# Patient Record
Sex: Male | Born: 1960 | Race: Black or African American | Hispanic: No | Marital: Married | Smoking: Current every day smoker
Health system: Southern US, Community
[De-identification: ages and names within clinical notes are randomized; demographics above are authoritative.]

## PROBLEM LIST (undated history)

## (undated) DIAGNOSIS — B1920 Unspecified viral hepatitis C without hepatic coma: Secondary | ICD-10-CM

## (undated) HISTORY — PX: PELVIC FRACTURE SURGERY: SHX119

## (undated) HISTORY — DX: Unspecified viral hepatitis C without hepatic coma: B19.20

## (undated) HISTORY — PX: NO PAST SURGERIES: SHX2092

---

## 2005-12-23 ENCOUNTER — Emergency Department (HOSPITAL_COMMUNITY): Admission: EM | Admit: 2005-12-23 | Discharge: 2005-12-24 | Payer: Self-pay | Admitting: Emergency Medicine
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
## 2014-10-03 ENCOUNTER — Other Ambulatory Visit: Payer: Self-pay | Admitting: Family Medicine

## 2014-10-03 MED ORDER — HYDROCHLOROTHIAZIDE 25 MG PO TABS: 25.0000 mg | ORAL_TABLET | Freq: Every day | ORAL | Status: DC

## 2014-10-03 MED ORDER — TRIAMCINOLONE ACETONIDE 0.1 % EX CREA: 1.0000 "application " | TOPICAL_CREAM | Freq: Two times a day (BID) | CUTANEOUS | Status: DC

## 2014-10-03 MED ORDER — LISINOPRIL 40 MG PO TABS: 40.0000 mg | ORAL_TABLET | Freq: Every day | ORAL | Status: DC

## 2014-10-03 NOTE — Telephone Encounter (Signed)
Refilled Medication for 30 days no refill, informed patient to schedule appointment [facility]

## 2021-09-04 ENCOUNTER — Telehealth: Payer: Self-pay

## 2021-09-04 ENCOUNTER — Other Ambulatory Visit (HOSPITAL_COMMUNITY): Payer: Self-pay

## 2021-09-04 NOTE — Telephone Encounter (Signed)
[facility] Patient Advocate Encounter ? ?Insurance verification completed.   ? ?The patient is uninsured and will need patient assistance for medication. ? ?We can complete the application and will need to meet with the patient for signatures and income documentation. ? ?Ralph Brouwer, CPhT ?Specialty Pharmacy Patient Advocate ?[facility] ?Phone: [phone] ?Fax:  [phone]  ?

## 2021-09-05 ENCOUNTER — Ambulatory Visit (INDEPENDENT_AMBULATORY_CARE_PROVIDER_SITE_OTHER): Payer: No Typology Code available for payment source | Admitting: Infectious Diseases

## 2021-09-05 ENCOUNTER — Encounter: Payer: Self-pay | Admitting: Infectious Diseases

## 2021-09-05 ENCOUNTER — Other Ambulatory Visit (HOSPITAL_COMMUNITY): Payer: Self-pay

## 2021-09-05 ENCOUNTER — Other Ambulatory Visit: Payer: Self-pay

## 2021-09-05 VITALS — BP 151/90 | HR 77 | Temp 98.1°F | Wt 167.0 lb

## 2021-09-05 DIAGNOSIS — K7460 Unspecified cirrhosis of liver: Secondary | ICD-10-CM | POA: Diagnosis not present

## 2021-09-05 DIAGNOSIS — D696 Thrombocytopenia, unspecified: Secondary | ICD-10-CM | POA: Diagnosis not present

## 2021-09-05 DIAGNOSIS — B182 Chronic viral hepatitis C: Secondary | ICD-10-CM

## 2021-09-05 LAB — CBC
Hemoglobin: 15.3 g/dL (ref 13.2–17.1)
MCH: 35.8 pg — ABNORMAL HIGH (ref 27.0–33.0)
MCHC: 36.2 g/dL — ABNORMAL HIGH (ref 32.0–36.0)
MCV: 99.1 fL (ref 80.0–100.0)
RBC: 4.27 10*6/uL (ref 4.20–5.80)
RDW: 11.8 % (ref 11.0–15.0)

## 2021-09-05 NOTE — Progress Notes (Unsigned)
   Patient Name: Kenneth Flores  Date of Birth: [birth year]  MRN: [id number]  PCP: Norm Salt, PA  Referring Provider: Norm Salt, [place], Ph#: [phone]   There are no problems to display for this patient.   CC:  New patient - initial evaluation and management of chronic hepatitis C infection.   HPI/ROS:  Kenneth Flores is a 61 y.o. male.   He was diagnosed in the early 1990s. Thinks he had a treatment    Patient {does/does not:19866} have documented immunity to Hepatitis A. Patient {does/does not:19867} have documented immunity to Hepatitis B.    {ros - complete:22885} All other systems reviewed and are negative      No past medical history on file.  Prior to Admission medications   Medication Sig Start Date End Date Taking? Authorizing Provider  hydrochlorothiazide (HYDRODIURIL) 25 MG tablet Take 1 tablet (25 mg total) by mouth daily. Patient not taking: Reported on 09/05/2021 10/03/14   Ellyn Hack, MD  lisinopril (PRINIVIL,ZESTRIL) 40 MG tablet Take 1 tablet (40 mg total) by mouth daily. Patient not taking: Reported on 09/05/2021 10/03/14   Ellyn Hack, MD  triamcinolone cream (KENALOG) 0.1 % Apply 1 application topically 2 (two) times daily. Patient not taking: Reported on 09/05/2021 10/03/14   Ellyn Hack, MD    No Known Allergies  Social History   Tobacco Use   Smoking status: Every Day    Types: Cigars   Smokeless tobacco: Never  Substance Use Topics   Alcohol use: Not Currently   Drug use: Not Currently    Types: Cocaine, Marijuana    Comment: years ago    No family history on file. ***  Objective:   Vitals:   09/05/21 1419  BP: (!) 151/90  Pulse: 77  Temp: 98.1 F (36.7 C)   Constitutional: {EXAM; GENERAL APPEARANCE:5021} Eyes: anicteric Cardiovascular: {Mis exam cardio:32073} Respiratory: {Exam; lungs brief:12271} Gastrointestinal: {Exam; abdomen:5794} Musculoskeletal: {extremities:315109} Skin: {Skin exam gi:12013};  no porphyria cutanea tarda Lymphatic: no cervical lymphadenopathy   Laboratory: Genotype: No results found for: "HCVGENOTYPE" HCV viral load: No results found for: "HCVQUANT" No results found for: "WBC", "HGB", "HCT", "MCV", "PLT" No results found for: "CREATININE", "BUN", "NA", "K", "CL", "CO2" No results found for: "ALT", "AST", "GGT", "ALKPHOS"  No results found for: "INR", "BILITOT", "ALBUMIN"  APRI ***   FIB-4 ***   Imaging:  ***  Assessment & Plan:   Problem List Items Addressed This Visit   None   I spent 45 minutes with the patient including greater than 70% of time in face to face counsel of the patient re hepatitis c and the details described above and in coordination of their care.  Rexene Alberts, MSN, NP-C [facility] for Infectious Disease [facility]  [email or web address] Pager: [phone] Office: [phone] [facility] Main Line: [phone]

## 2021-09-05 NOTE — Patient Instructions (Addendum)
Nice to meet you today!    We need to get a little more information about your hepatitis c infection before we start your treatment. I anticipate that we can get you started in a few weeks after we submit approval to your insurance to ensure payment. We may need to place referral for an ultrasound and/or gastroenterology if your blood work indicates more damage to the liver than expected.    We will call you with your results (1 week usually is how long this takes) and then when your medication is ready. Our pharmacist will reach out to you to go over the medication, side effects and how to take it successfully.   We will see you back in 4 weeks after starting to see how things are going for you. Will repeat your blood work at that time.    ABOUT HEPATITIS C VIRUS:  Chronic Hepatitis C is the most common blood-borne infection in the [place], affecting approximately 3 million people.  It is the leading cause of cirrhosis, liver cancer, and end stage liver disease requiring transplantation when this infection goes untreated for many years  The majority of people who are infected are unaware because there are not many early symptoms that are specific to this and often go undiagnosed until a specific blood test is drawn.   The hepatitis c virus is passed primarily through direct exposure of contaminated blood or body fluids. It is most efficiently transmitted through repeated exposure to infected blood.  Risk for sexual transmission is very low but is possible if there is high frequency of unprotected sexual activity with known hepatitis c partner or multiple partners of known status.  Over time, approximately 60-70% of people can develop some degree of liver disease. Cirrhosis occurs in 10-20% of those with chronic infection. 1-5% will get liver cancer, which has a very high rate of death.   Approximately 15-25% clear the infection without medication (usually in the first 6 months of  becoming exposed to virus)  Newer medications provide over 95% cure rate when taken as prescribed    IN GENERAL ABOUT DIET  Persons living with chronic hepatitis c infection should eat a diet to maintain a healthy weight and avoid nutritional deficiencies.   Completely avoiding alcohol is the best decision for your liver health. If unable to do so please limit alcohol to as little as possible to less than 1 standard drink a day - this is very irritating to your liver.  Limit tylenol use to less than 2,000 mg daily (two extra strength tablets only twice a day)  If you have cirrhosis of the liver please take no more than 1,000 mg tylenol a day  Patients with cirrhosis should not have protein restriction; we recommend a protein intake of approximately 1.2-1.5 g/kg/day.   For patients with cirrhosis and hepatic encephalopathy, the American Association for the Study of Liver Diseases (AASLD) recommended protein intake is 1.2-1.5 g/kg/day.  If you experience ascites (fluid accumulation in the abdomen associated with severe liver damage / cirrhosis) please limit sodium intake to < 2000 mg a day    UNTIL YOU HAVE BEEN TREATED AND CURED:  Use condoms with all sexual encounters or practice abstinence to avoid sexual transmission   No sharing of razors, toothbrushes, nail clippers or anything that could potentially have blood on it.   If you cut yourself please clean and cover any wounds or open sores to others do not come into contact with your blood.  If blood spills onto item/surface please clean with 1:10 bleach solution and allow to dry, EVEN if it is dried blood.    GENERAL HELPFUL HINTS ON HCV THERAPY:  1. Stay well-hydrated.  2. Notify the ID Clinic of any changes in your other over-the-counter/herbal or prescription medications.  3. If you miss a dose of your medication, take the missed dose as soon as you remember. Return to your regular time/dose schedule the next day.    4.  Do not stop taking your medications without first talking with your healthcare provider.  5.  You will see our pharmacist-specialist within the first 2 weeks of starting your medication to monitor for any possible side effects.  6.  You will have blood work once during treatment 4 weeks after your first pill. Again soon after treatment is completed and one final lab 3 months after your last pill to ensure cure!   TIPS TO BE SUCCESSFUL WITH DAILY MEDICATION USE:  1. Set a reminder on your phone  2. Try filling out a pill box for the week - pick a day and put one pill for every day during the week so you know right away if you missed a pill.   3. Have a trusted family member ask you about your medications.   4. Emergency planning/management officer Instructions:  Take Mavyret, three tablets (at the same time) daily with food. Please take ALL THREE PILLS AT ONCE. You should take it at approximately the same time every day. Treatment will be for 8 weeks. Do not miss a dose.    Do not run out of Mavyret! If you are down to one week of medication left and have not heard about your next shipment, please let us know as soon as possible. You will be given 28 days of treatment at a time and will receive one refill.   If you need to start a new medication, prescription from your doctor or over the counter medication, you need to contact us to make sure it does not interfere with Mavyret. There are several medications that can interfere with Mavyret and can make you sick or make the medication not work.  If you need to take a medication for acid reflux, you can take omeprazole 20mg  daily.     Tylenol (acetominophen) and Advil (ibuprofen) are safe to take with Harvoni if needed for headache, fever, pain.   IF YOU ARE ON BIRTH CONTROL PILLS, YOU RECEIVE A SHOT FOR BIRTH CONTROL, OR YOU HAVE AN IUD, please notify your provider to make sure it is safe with Mavyret.   DO NOT stop Mavyret unless  instructed to by your provider. If you are hospitalized while taking this medication please bring it with you to the hospital to avoid interruption of therapy. Every pill is important!  The most common side effects associated with Mavyret include:  Fatigue Headache Nausea Diarrhea Insomnia

## 2021-09-08 DIAGNOSIS — D696 Thrombocytopenia, unspecified: Secondary | ICD-10-CM | POA: Insufficient documentation

## 2021-09-08 DIAGNOSIS — B182 Chronic viral hepatitis C: Secondary | ICD-10-CM | POA: Insufficient documentation

## 2021-09-08 LAB — HEPATITIS B SURFACE ANTIBODY,QUALITATIVE: Hep B S Ab: REACTIVE — AB

## 2021-09-08 LAB — HEPATITIS A ANTIBODY, TOTAL: Hepatitis A AB,Total: REACTIVE — AB

## 2021-09-08 NOTE — Assessment & Plan Note (Signed)
New Patient with Chronic Hepatitis C first diagnosed with blood test in the 1990s. Genotype unknown with inconclusive fibrosis risk based on labs provided. Will check fibrotest and ultrasound for baseline. PT/INR today as well as CBC. He may have been treated with interferon injections based on his description but that will not preclude him from any of the available DAA's now and should be simple to cure.  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 Counseled to continue to abstain/limit alcohol use to avoid accelerated fibrosis/cirrhosis risk.   He has had HIV testing at outside clinic recently and does not need to repeat this today based on our discussion.   I discussed with the patient the lab findings that confirm chronic hepatitis C as well as the natural history and progression of disease including about 30% of people who develop cirrhosis of the liver if left untreated and once cirrhosis is established there is a 2-7% risk per year of liver cancer and liver failure.  I discussed the importance of treatment and benefits in reducing the risk, even if significant liver fibrosis exists. I also discussed risk for re-infection following treatment should he not continue to modify risk factors.    Patient counseled extensively on limiting acetaminophen to no more than 2 grams daily, avoidance of alcohol.  Transmission discussed with patient including sexual transmission, sharing razors and toothbrush.   Will need referral to gastroenterology if concern for cirrhosis  Will prescribe appropriate medication based on genotype and coverage   Hepatitis A and B titers to be drawn today with appropriate vaccinations as needed   Pneumovax vaccine at upcoming visit if not previously given  Further work up to include liver staging through non-invasive serum analysis with APRI and FIB4 scores and Liver Fibrosis panel; U/S to follow if discordant or concerning  results.  Will call Garett Tetzloff back once all results are in and counsel on medication over the phone. He will return 4 weeks after starting to meet with pharmacy team and check RNA at that time.

## 2021-09-08 NOTE — Assessment & Plan Note (Signed)
Documented history of this in provided medical records but no labs to verify - check CBC today.

## 2021-09-10 ENCOUNTER — Ambulatory Visit (HOSPITAL_COMMUNITY)
Admission: RE | Admit: 2021-09-10 | Discharge: 2021-09-10 | Disposition: A | Discharge status: Home / Self Care | Payer: No Typology Code available for payment source | Source: Ambulatory Visit | Attending: Infectious Diseases | Admitting: Infectious Diseases

## 2021-09-10 DIAGNOSIS — B182 Chronic viral hepatitis C: Secondary | ICD-10-CM | POA: Insufficient documentation

## 2021-09-12 LAB — LIVER FIBROSIS, FIBROTEST-ACTITEST
ALT: 89 U/L — ABNORMAL HIGH (ref 9–46)
Bilirubin: 1.1 mg/dL (ref 0.2–1.2)
Haptoglobin: 64 mg/dL (ref 43–212)
Necroinflammat ACT Score: 0.74
Reference ID: 4422423

## 2021-09-12 LAB — CBC
HCT: 42.3 % (ref 38.5–50.0)
MPV: 12.2 fL (ref 7.5–12.5)
Platelets: 91 10*3/uL — ABNORMAL LOW (ref 140–400)
WBC: 5.2 10*3/uL (ref 3.8–10.8)

## 2021-09-12 LAB — PROTIME-INR
INR: 1
Prothrombin Time: 10.8 s (ref 9.0–11.5)

## 2021-09-12 LAB — HEPATITIS C GENOTYPE

## 2021-09-15 ENCOUNTER — Other Ambulatory Visit (HOSPITAL_COMMUNITY): Payer: Self-pay

## 2021-09-17 ENCOUNTER — Telehealth: Payer: Self-pay

## 2021-09-17 NOTE — Telephone Encounter (Signed)
[facility] Patient Advocate Encounter   Received notification from [facility] that prior authorization for MAVYRET is required.   PA submitted on 09/17/21 Key [id number] Status is pending    [facility] Clinic will continue to follow.   Clearance Coots, CPhT Specialty Pharmacy Patient [facility] Phone: [phone] Fax:  [phone]

## 2021-09-29 ENCOUNTER — Telehealth: Payer: Self-pay

## 2021-09-29 NOTE — Telephone Encounter (Signed)
Patient called with question regarding recent referrals from [facility]. Provided information on gastroenterology referral, including office and phone number [facility], [phone]). All questions answered. Patient stated he would call the [place] office to schedule.  Wyvonne Lenz, RN

## 2021-10-02 ENCOUNTER — Ambulatory Visit (INDEPENDENT_AMBULATORY_CARE_PROVIDER_SITE_OTHER): Payer: No Typology Code available for payment source | Admitting: Infectious Diseases

## 2021-10-02 ENCOUNTER — Encounter: Payer: Self-pay | Admitting: Infectious Diseases

## 2021-10-02 ENCOUNTER — Other Ambulatory Visit: Payer: Self-pay

## 2021-10-02 VITALS — BP 135/82 | HR 65 | Temp 97.9°F | Wt 164.0 lb

## 2021-10-02 DIAGNOSIS — B182 Chronic viral hepatitis C: Secondary | ICD-10-CM

## 2021-10-02 NOTE — Progress Notes (Signed)
Still has not had any update from insurance regarding PA for mavyret   No charge today and copay was refunded.

## 2021-10-10 ENCOUNTER — Telehealth: Payer: Self-pay

## 2021-10-10 NOTE — Telephone Encounter (Signed)
[facility] Patient Advocate Encounter  Completed and sent [employer] application for Mavyret for this patient who is uninsured.    Patient is approved 10/08/21 through 04/10/22.  Medication will be delivered to the clinic on or after 10/15/21.  Patient insurance denied Mavyret & Epclusa.   Clearance Coots, CPhT Specialty Pharmacy Patient [facility] for Infectious Disease Phone: [phone] Fax:  [phone]

## 2021-10-16 ENCOUNTER — Telehealth: Payer: Self-pay | Admitting: Pharmacist

## 2021-10-16 ENCOUNTER — Telehealth: Payer: Self-pay

## 2021-10-16 NOTE — Telephone Encounter (Signed)
Patient is approved to receive Mavyret x 8 weeks for chronic Hepatitis C infection. Counseled patient to take all three tablets of Mavyret daily with food.  Counseled patient the need to take all three tablets together and to not separate them out during the day. Encouraged patient not to miss any doses and explained how their chance of cure could go down with each dose missed. Counseled patient on what to do if dose is missed - if it is closer to the missed dose take immediately; if closer to next dose then skip dose and take the next dose at the usual time. Counseled patient on common side effects such as headache, fatigue, and nausea and that these normally decrease with time. I reviewed patient medications and found no drug interactions. Discussed with patient that there are several drug interactions with Mavyret and instructed patient to call the clinic if he wishes to start a new medication during course of therapy. Also advised patient to call if he experiences any side effects. Patient will follow-up with me in the pharmacy clinic on 8/25.  Margarite Gouge, PharmD, CPP, BCIDP Clinical Pharmacist Practitioner Infectious Diseases Clinical Pharmacist [facility] for Infect[facility] Disease

## 2021-10-16 NOTE — Telephone Encounter (Signed)
[facility] Patient Advocate Encounter  Patient's medications have been couriered to [facility] from [facility] and will be picked up 10/16/21.  1st box of Mavyret  Clearance Coots , CPhT Specialty Pharmacy Patient [facility] Phone: [phone] Fax:  [phone]

## 2021-10-16 NOTE — Telephone Encounter (Signed)
error 

## 2021-11-13 ENCOUNTER — Other Ambulatory Visit: Payer: Self-pay

## 2021-11-13 ENCOUNTER — Ambulatory Visit (INDEPENDENT_AMBULATORY_CARE_PROVIDER_SITE_OTHER): Payer: No Typology Code available for payment source | Admitting: Pharmacist

## 2021-11-13 DIAGNOSIS — B182 Chronic viral hepatitis C: Secondary | ICD-10-CM

## 2021-11-13 MED ORDER — MAVYRET 100-40 MG PO TABS: 3.0000 | ORAL_TABLET | Freq: Every day | ORAL | 1 refills | Status: DC

## 2021-11-13 NOTE — Progress Notes (Signed)
   HPI: Kenneth Flores is a 61 y.o. male who presents to the [facility] pharmacy clinic for Hepatitis C follow-up.  Medication: Mavyret x 8 weeks  Start Date: 10/17/21  Hepatitis C Genotype: 1a  Fibrosis Score: F4  Hepatitis C RNA: 1.82 million  Patient Active Problem List   Diagnosis Date Noted   Chronic hepatitis C without hepatic coma (HCC) 09/08/2021   Thrombocytopenia (HCC) 09/08/2021    Patient's Medications  New Prescriptions   GLECAPREVIR-PIBRENTASVIR (MAVYRET) 100-40 MG TABS    Take 3 tablets by mouth daily with breakfast.  Previous Medications   No medications on file  Modified Medications   No medications on file  Discontinued Medications   No medications on file    Allergies: No Known Allergies  Past Medical History: Past Medical History:  Diagnosis Date   Hepatitis C     Social History: Social History   Socioeconomic History   Marital status: Significant Other    Spouse name: Not on file   Number of children: Not on file   Years of education: Not on file   Highest education level: Not on file  Occupational History   Not on file  Tobacco Use   Smoking status: Every Day    Types: Cigars   Smokeless tobacco: Never  Substance and Sexual Activity   Alcohol use: Not Currently   Drug use: Not Currently    Types: Cocaine, Marijuana    Comment: years ago   Sexual activity: Not on file  Other Topics Concern   Not on file  Social History Narrative   Not on file   Social Determinants of Health   Financial Resource Strain: Not on file  Food Insecurity: Not on file  Transportation Needs: Not on file  Physical Activity: Not on file  Stress: Not on file  Social Connections: Not on file    Labs: Hepatitis C Lab Results  Component Value Date   HCVGENOTYPE 1a 09/05/2021   FIBROSTAGE F4 09/05/2021   Hepatitis B Lab Results  Component Value Date   HEPBSAB REACTIVE (A) 09/05/2021   Hepatitis A Lab Results  Component Value Date   HAV REACTIVE (A)  09/05/2021   HIV No results found for: "HIV" No results found for: "CREATININE" Lab Results  Component Value Date   ALT 89 (H) 09/05/2021   INR 1.0 09/05/2021    Assessment: Kenneth Flores presents to clinic today for HCV follow-up. He has been taking Mavyret every evening with dinner for 4 weeks and is tolerating the medicine well. He has not missed any doses. Will check HCV RNA today and follow-up in 4 weeks. No further concerns or issues. Will check LFTs at next visit.    Plan: Check HCV RNA Continue Mavyret x 4 weeks Follow up with me in 4 weeks   Margarite Gouge, PharmD, CPP, BCIDP Clinical Pharmacist Practitioner Infectious Diseases Clinical Pharmacist [facility] 11/13/2021, 4:15 PM

## 2021-11-14 ENCOUNTER — Ambulatory Visit: Payer: No Typology Code available for payment source | Admitting: Pharmacist

## 2021-11-15 LAB — HEPATITIS C RNA QUANTITATIVE
HCV Quantitative Log: <1.18 log IU/mL
HCV RNA, PCR, QN: <15 IU/mL

## 2021-12-16 ENCOUNTER — Ambulatory Visit (INDEPENDENT_AMBULATORY_CARE_PROVIDER_SITE_OTHER): Payer: No Typology Code available for payment source | Admitting: Pharmacist

## 2021-12-16 ENCOUNTER — Other Ambulatory Visit: Payer: Self-pay

## 2021-12-16 DIAGNOSIS — B182 Chronic viral hepatitis C: Secondary | ICD-10-CM | POA: Diagnosis not present

## 2021-12-16 NOTE — Progress Notes (Signed)
   HPI: Kenneth Flores is a 61 y.o. male who presents to the [place] clinic for Hepatitis C follow-up.  Medication: Mavyret x 8 weeks  Start Date: 10/17/21  Hepatitis C Genotype: 1a  Fibrosis Score: F4  Hepatitis C RNA: 1.82 million > undetectable (11/13/21)  Patient Active Problem List   Diagnosis Date Noted   Chronic hepatitis C without hepatic coma ([place]) 09/08/2021   Thrombocytopenia (HCC) 09/08/2021    Patient's Medications  New Prescriptions   No medications on file  Previous Medications   GLECAPREVIR-PIBRENTASVIR (MAVYRET) 100-40 MG TABS    Take 3 tablets by mouth daily with breakfast.  Modified Medications   No medications on file  Discontinued Medications   No medications on file    Allergies: No Known Allergies  Past Medical History: Past Medical History:  Diagnosis Date   Hepatitis C     Social History: Social History   Socioeconomic History   Marital status: Significant Other    Spouse name: Not on file   Number of children: Not on file   Years of education: Not on file   Highest education level: Not on file  Occupational History   Not on file  Tobacco Use   Smoking status: Every Day    Types: Cigars   Smokeless tobacco: Never  Substance and Sexual Activity   Alcohol use: Not Currently   Drug use: Not Currently    Types: Cocaine, Marijuana    Comment: years ago   Sexual activity: Not on file  Other Topics Concern   Not on file  Social History Narrative   Not on file   Social Determinants of Health   Financial Resource Strain: Not on file  Food Insecurity: Not on file  Transportation Needs: Not on file  Physical Activity: Not on file  Stress: Not on file  Social Connections: Not on file    Labs: Hepatitis C Lab Results  Component Value Date   HCVGENOTYPE 1a 09/05/2021   HCVRNAPCRQN <15 11/13/2021   FIBROSTAGE F4 09/05/2021   Hepatitis B Lab Results  Component Value Date   HEPBSAB REACTIVE (A) 09/05/2021   Hepatitis  A Lab Results  Component Value Date   HAV REACTIVE (A) 09/05/2021   HIV No results found for: "HIV" No results found for: "CREATININE" Lab Results  Component Value Date   ALT 89 (H) 09/05/2021   INR 1.0 09/05/2021    Assessment: Kenneth Flores presents to clinic today for HCV follow-up. He recently completed 8 weeks of [place] and continued tolerating the medicine well. He denies missing any doses and confirms he always took the medicine with food. Will check HIV RNA along with CMP today. He will follow up with Colletta Maryland in 3 months for cure visit.   Offered flu shot today which he deferred. States he has not received a flu shot in years and rarely gets sick.   Plan: Check HCV RNA and CMP Follow up with Colletta Maryland in 3 months   Alfonse Spruce, PharmD, CPP, [place] Clinical Pharmacist Practitioner Infectious Diseases [facility] 12/16/2021, 3:58 PM

## 2021-12-17 LAB — COMPREHENSIVE METABOLIC PANEL
ALT: 13 U/L (ref 9–46)
AST: 21 U/L (ref 10–35)
Alkaline phosphatase (APISO): 81 U/L (ref 35–144)
CO2: 26 mmol/L (ref 20–32)
Chloride: 102 mmol/L (ref 98–110)
Creat: 0.88 mg/dL (ref 0.70–1.30)
Globulin: 3.3 g/dL (calc) (ref 1.9–3.7)
Sodium: 137 mmol/L (ref 135–146)
Total Bilirubin: 0.6 mg/dL (ref 0.2–1.2)
Total Protein: 7.7 g/dL (ref 6.1–8.1)

## 2021-12-18 LAB — COMPREHENSIVE METABOLIC PANEL
AG Ratio: 1.3 (calc) (ref 1.0–2.5)
Albumin: 4.4 g/dL (ref 3.6–5.1)
BUN: 14 mg/dL (ref 7–25)
Calcium: 9.3 mg/dL (ref 8.6–10.3)
Glucose, Bld: 82 mg/dL (ref 65–99)
Potassium: 4 mmol/L (ref 3.5–5.3)

## 2021-12-18 LAB — HEPATITIS C RNA QUANTITATIVE
HCV Quantitative Log: 4.41 log IU/mL — ABNORMAL HIGH
HCV RNA, PCR, QN: 25700 IU/mL — ABNORMAL HIGH

## 2021-12-18 NOTE — Progress Notes (Signed)
Patient viremic after completing 8 weeks of [place]. Was undetectable at 8-month follow-up and states he never missed a dose. Denies taking any other medications. Colletta Maryland, let us know if you would like [place] to follow-up with the patient and/or restart treatment with Vosevi x 12 weeks. Thanks! Estill Bamberg

## 2021-12-18 NOTE — Progress Notes (Signed)
I know - truly! Will do - thanks.

## 2021-12-18 NOTE — Progress Notes (Signed)
Kenneth Flores, this patient failed Mavyret treatment. Can you look into Vosevi coverage for him? GT 1a, F4, RNA initially 1.82 million to undetectable on 11/13/21 (now up to 25.7K). Thanks!

## 2021-12-19 ENCOUNTER — Other Ambulatory Visit (HOSPITAL_COMMUNITY): Payer: Self-pay

## 2021-12-22 ENCOUNTER — Telehealth: Payer: Self-pay

## 2021-12-22 NOTE — Telephone Encounter (Signed)
[facility] Patient Advocate Encounter  Completed and sent Support Path application for Vosevi for this patient who is uninsured.    Patient assistance phone number for follow up is [phone].   This encounter will be updated until final determination.   Ileene Patrick, [place] [facility] Patient [facility] for Infectious Disease Phone: [phone] Fax:  [phone]

## 2021-12-23 ENCOUNTER — Telehealth: Payer: Self-pay

## 2021-12-23 ENCOUNTER — Other Ambulatory Visit: Payer: Self-pay | Admitting: Pharmacist

## 2021-12-23 DIAGNOSIS — B182 Chronic viral hepatitis C: Secondary | ICD-10-CM

## 2021-12-23 MED ORDER — VOSEVI 400-100-100 MG PO TABS: 1.0000 | ORAL_TABLET | Freq: Every day | ORAL | 2 refills | Status: DC

## 2021-12-23 NOTE — Telephone Encounter (Signed)
[facility] Patient Advocate Encounter  Completed and sent Support Path application for Vosevi for this patient who is uninsured.    Patient is approved 12/23/21 through 03/25/2022.  I faxed harcopy prescription to [phone]   Medication will be delivered to the office.   Ileene Patrick, [place] [facility] Patient [facility] for Infectious Disease Phone: [phone] Fax:  [phone]

## 2021-12-25 ENCOUNTER — Telehealth: Payer: Self-pay

## 2021-12-25 NOTE — Telephone Encounter (Addendum)
Patient is approved to receive Vosevi x 12 weeks for chronic Hepatitis C infection. Counseled patient to take Vosevi daily WITH food. Encouraged patient not to miss any doses and explained how their chance of cure could go down with each dose missed. Counseled patient on what to do if dose is missed - if it is closer to the missed dose take immediately; if closer to next dose skip dose and take the next dose at the usual time. Counseled patient on common side effects such as headache, fatigue, and nausea and that these normally decrease with time. I reviewed patient medications and found no drug interactions. Discussed with patient that there are several drug interactions. Instructed patient to call clinic if he wishes to start a new medication during course of therapy. Also advised patient to call if he experiences any side effects. Patient will follow-up with Cassie in the pharmacy clinic on 01/21/2022.   Eliseo Gum, PharmD PGY1 Pharmacy Resident   12/25/2021  4:03 PM

## 2021-12-25 NOTE — Telephone Encounter (Signed)
[facility] Patient Advocate Encounter  Patient's medications have been couriered to [facility] from [employer] and will be picked up 12/25/21.  [place] , [place] Patient [facility] for Infectious Disease Phone: [phone] Fax:  [phone]

## 2022-01-14 ENCOUNTER — Telehealth: Payer: Self-pay

## 2022-01-14 NOTE — Telephone Encounter (Signed)
[facility] Patient Advocate Encounter  Patient's medications [place] have been couriered to [facility] from [place]: [phone], and will be administered on  01/21/2022.

## 2022-01-21 ENCOUNTER — Other Ambulatory Visit: Payer: Self-pay

## 2022-01-21 ENCOUNTER — Ambulatory Visit (INDEPENDENT_AMBULATORY_CARE_PROVIDER_SITE_OTHER): Payer: No Typology Code available for payment source | Admitting: Pharmacist

## 2022-01-21 DIAGNOSIS — B182 Chronic viral hepatitis C: Secondary | ICD-10-CM

## 2022-01-21 DIAGNOSIS — Z23 Encounter for immunization: Secondary | ICD-10-CM

## 2022-01-21 NOTE — Progress Notes (Unsigned)
01/21/2022  HPI: Kenneth Flores is a 61 y.o. male who presents to the [place] clinic for Hepatitis C follow-up.  Medication: Vosevi x 12 weeks   Start Date: 12/25/21   Hepatitis C Genotype: 1a  Fibrosis Score: F4   Hepatitis C RNA: 1.82 million>>undetectable (11/13/21)>>25,700 (12/18/21)  Patient Active Problem List   Diagnosis Date Noted   Chronic hepatitis C without hepatic coma ([place]) 09/08/2021   Thrombocytopenia ([place]) 09/08/2021    Patient's Medications  New Prescriptions   No medications on file  Previous Medications   SOFOSBUV-VELPATASV-VOXILAPREV (VOSEVI) 400-100-100 MG TABS    Take 1 tablet by mouth daily at 6 (six) AM.  Modified Medications   No medications on file  Discontinued Medications   No medications on file    Allergies: No Known Allergies  Past Medical History: Past Medical History:  Diagnosis Date   Hepatitis C     Social History: Social History   Socioeconomic History   Marital status: Significant Other    Spouse name: Not on file   Number of children: Not on file   Years of education: Not on file   Highest education level: Not on file  Occupational History   Not on file  Tobacco Use   Smoking status: Every Day    Types: Cigars   Smokeless tobacco: Never  Substance and Sexual Activity   Alcohol use: Not Currently   Drug use: Not Currently    Types: Cocaine, Marijuana    Comment: years ago   Sexual activity: Not on file  Other Topics Concern   Not on file  Social History Narrative   Not on file   Social Determinants of Health   Financial Resource Strain: Not on file  Food Insecurity: Not on file  Transportation Needs: Not on file  Physical Activity: Not on file  Stress: Not on file  Social Connections: Not on file    Labs: Hepatitis C Lab Results  Component Value Date   HCVGENOTYPE 1a 09/05/2021   HCVRNAPCRQN 25,700 (H) 12/16/2021   HCVRNAPCRQN <15 11/13/2021   FIBROSTAGE F4 09/05/2021   Hepatitis B Lab Results   Component Value Date   HEPBSAB REACTIVE (A) 09/05/2021   Hepatitis A Lab Results  Component Value Date   HAV REACTIVE (A) 09/05/2021   HIV No results found for: "HIV" Lab Results  Component Value Date   CREATININE 0.88 12/16/2021   Lab Results  Component Value Date   AST 21 12/16/2021   ALT 13 12/16/2021   ALT 89 (H) 09/05/2021   INR 1.0 09/05/2021    Assessment: Patient reports to clinic today for follow-up on hepatitis C treatment with Vosevi. Patient recently completed 8 weeks of treatment with Mavyret and was initially undetectable at 42-month follow-up, however at EOT his viral load was again elevated at 25,700. Patient reported being adherent with his medication and was taking the medication appropriately, thus this was deemed a treatment failure and patient was started on Vosevi.   Patient reports he had some diarrhea the first 2 weeks he was taking the medication, but no issues since. Patient was counseled that this is a typical "start-up" symptom associated with the medication and to contact the [facility] clinc if these symptoms recur.   After discussion of the risks and benefits, patient has agreed to proceed with influenza vaccine.    Plan: Vosevi refill provided today in clinic  F/U HCV RNA  Influenza vaccine administered IM x1 in clinic today  Next appointment scheduled  for 02/17/22 with me   Adria Dill, PharmD PGY-2 Infectious Diseases Resident  01/21/2022 7:27 AM

## 2022-01-22 ENCOUNTER — Ambulatory Visit: Payer: No Typology Code available for payment source | Admitting: Pharmacist

## 2022-01-23 LAB — HEPATITIS C RNA QUANTITATIVE
HCV Quantitative Log: <1.18 log IU/mL
HCV RNA, PCR, QN: <15 IU/mL

## 2022-02-05 ENCOUNTER — Telehealth: Payer: Self-pay

## 2022-02-05 NOTE — Telephone Encounter (Signed)
[facility] Patient Advocate Encounter  Patient's medications have been couriered to [facility] from [facility] and will be picked up 02/17/22.  3rd Vosevi bottle  Clearance Coots , CPhT Specialty Pharmacy Patient [facility] for Infectious Disease Phone: [phone] Fax:  [phone]

## 2022-02-16 NOTE — Progress Notes (Signed)
02/16/2022  HPI: Kenneth Flores is a 60 y.o. male who presents to the [facility] pharmacy clinic for Hepatitis C follow-up.  Medication: Vosevi x 12 weeks  Start Date: 12/25/2021  Hepatitis C Genotype: 1a  Fibrosis Score: F4  Hepatitis C RNA: 1.82 million --> undetectable (11/13/2021) --> 25,700 (12/18/2021) --> undetectable (01/21/2022)  Patient Active Problem List   Diagnosis Date Noted   Chronic hepatitis C without hepatic coma (HCC) 09/08/2021   Thrombocytopenia (HCC) 09/08/2021    Patient's Medications  New Prescriptions   No medications on file  Previous Medications   SOFOSBUV-VELPATASV-VOXILAPREV (VOSEVI) 400-100-100 MG TABS    Take 1 tablet by mouth daily at 6 (six) AM.  Modified Medications   No medications on file  Discontinued Medications   No medications on file    Allergies: No Known Allergies  Past Medical History: Past Medical History:  Diagnosis Date   Hepatitis C     Social History: Social History   Socioeconomic History   Marital status: Significant Other    Spouse name: Not on file   Number of children: Not on file   Years of education: Not on file   Highest education level: Not on file  Occupational History   Not on file  Tobacco Use   Smoking status: Every Day    Types: Cigars   Smokeless tobacco: Never  Substance and Sexual Activity   Alcohol use: Not Currently   Drug use: Not Currently    Types: Cocaine, Marijuana    Comment: years ago   Sexual activity: Not on file  Other Topics Concern   Not on file  Social History Narrative   Not on file   Social Determinants of Health   Financial Resource Strain: Not on file  Food Insecurity: Not on file  Transportation Needs: Not on file  Physical Activity: Not on file  Stress: Not on file  Social Connections: Not on file    Labs: Hepatitis C Lab Results  Component Value Date   HCVGENOTYPE 1a 09/05/2021   HCVRNAPCRQN <15 01/21/2022   HCVRNAPCRQN 25,700 (H) 12/16/2021   HCVRNAPCRQN  <15 11/13/2021   FIBROSTAGE F4 09/05/2021   Hepatitis B Lab Results  Component Value Date   HEPBSAB REACTIVE (A) 09/05/2021   Hepatitis A Lab Results  Component Value Date   HAV REACTIVE (A) 09/05/2021   HIV No results found for: "HIV" Lab Results  Component Value Date   CREATININE 0.88 12/16/2021   Lab Results  Component Value Date   AST 21 12/16/2021   ALT 13 12/16/2021   ALT 89 (H) 09/05/2021   INR 1.0 09/05/2021    Assessment: Kenneth Flores is presents today for a Hep C follow-up visit. He has been on Vosevi and reports no diarrhea since last visit and hasn't missed any doses. His HCV RNA was undetectable at last visit and we will check HCV RNA today for continued treatment assessment.    He is eligible for the COVID, Tdap, and Prevnar20 vaccines. After discussing the risks and benefits, he accepted the COVID and Prevnar20 vaccines today. Will defer the Tdap since he doesn't want too many vaccines at once but he is open to getting it next visit. Administered the COVID and Prevnar20 vaccines in the left deltoid.  Plan: - Administer the COVID and Prevnar20 vaccines today - Collect HCV RNA today - Follow up on 03/30/2022 with Judeth Cornfield for EOT visit - Offer Tdap vaccine at next visit  Jacolyn Reedy, Student Pharm-D [facility]  for Infectious Disease

## 2022-02-17 ENCOUNTER — Other Ambulatory Visit: Payer: Self-pay

## 2022-02-17 ENCOUNTER — Encounter: Payer: Self-pay | Admitting: Pharmacist

## 2022-02-17 ENCOUNTER — Ambulatory Visit (INDEPENDENT_AMBULATORY_CARE_PROVIDER_SITE_OTHER): Payer: No Typology Code available for payment source

## 2022-02-17 ENCOUNTER — Ambulatory Visit (INDEPENDENT_AMBULATORY_CARE_PROVIDER_SITE_OTHER): Payer: No Typology Code available for payment source | Admitting: Pharmacist

## 2022-02-17 DIAGNOSIS — Z23 Encounter for immunization: Secondary | ICD-10-CM | POA: Diagnosis not present

## 2022-02-17 DIAGNOSIS — B182 Chronic viral hepatitis C: Secondary | ICD-10-CM

## 2022-02-19 LAB — HEPATITIS C RNA QUANTITATIVE
HCV Quantitative Log: <1.18 log IU/mL
HCV RNA, PCR, QN: <15 IU/mL

## 2022-03-12 ENCOUNTER — Ambulatory Visit: Payer: No Typology Code available for payment source | Admitting: Infectious Diseases

## 2022-03-30 ENCOUNTER — Encounter: Payer: Self-pay | Admitting: Infectious Diseases

## 2022-03-30 ENCOUNTER — Ambulatory Visit (INDEPENDENT_AMBULATORY_CARE_PROVIDER_SITE_OTHER): Payer: No Typology Code available for payment source | Admitting: Infectious Diseases

## 2022-03-30 ENCOUNTER — Other Ambulatory Visit: Payer: Self-pay

## 2022-03-30 VITALS — BP 166/94 | HR 76 | Temp 97.8°F | Ht 72.0 in | Wt 177.0 lb

## 2022-03-30 DIAGNOSIS — B182 Chronic viral hepatitis C: Secondary | ICD-10-CM | POA: Diagnosis not present

## 2022-03-30 NOTE — Assessment & Plan Note (Signed)
Kenneth Flores is here for EOT follow up after 2nd attempt to eradicate Hep C. Now nearly complete with Vosevi.  He has some coarsened liver findings on U/S and fibrotest F4 but no signs of decompensated disease. Child Pugh A on retrospective check. I don't think we missed this for him, he just failed and was the unfortunately statistic. Alcohol consumption likely did not contribute to failure either (unless is caused him to miss large spans of time with his medication, which I do not suspect at all).   He has tolerated Vosevi w/o any side effects. His viral loads as expected have been responsive to treatment. Will check HCV RNA and CMP today. RTC in 62m for SVR visit.   He will need [place] screenings going forward with PCP. Will arrange a U/S at return visit + AFP at that lab draw.   Counseled that he should eliminate / reduce ETOH for general liver health - never binge and 1 standard drink a few days a week. This will also help his blood pressure in turn.

## 2022-03-30 NOTE — Progress Notes (Signed)
Patient Name: Kenneth Flores  Date of Birth: November 13, 1960  MRN: [id number]  PCP: Norm Salt, PA  Referring Provider: Norm Salt, [place], Ph#: [phone]   Patient Active Problem List   Diagnosis Date Noted   Chronic hepatitis C without hepatic coma (HCC) 09/08/2021   Thrombocytopenia (HCC) 09/08/2021    CC: Hepatitis C follow up    HPI/ROS:  Kenneth Flores is a 62 y.o. male with chronic hepatitis C, genotype 1a, F4.  Attempted treatment with mavyret in 2023 with rebound viremia to 25,700 copies 12/18/21 at SVR unfortunately.  He is now on Vosevi for 2nd attempt at treatment since October 2023. He is on his last bottle and has several doses left. Has not missed any doses. He does wonder if drinking alcohol may have had something to do with failure the first time.    ROS: Constitutional: negative for fevers, chills, and weight loss Eyes: negative for icterus Cardiovascular: negative for chest pain, lower extremity edema Gastrointestinal: negative for nausea, vomiting, change in bowel habits, abdominal pain, and jaundice Neurological: negative for headaches and dizziness Behavioral/Psych: negative for excessive alcohol consumption and illegal drug usage All other systems reviewed and are negative        Past Medical History:  Diagnosis Date   Hepatitis C     Prior to Admission medications   Medication Sig Start Date End Date Taking? Authorizing Provider  hydrochlorothiazide (HYDRODIURIL) 25 MG tablet Take 1 tablet (25 mg total) by mouth daily. Patient not taking: Reported on 09/05/2021 10/03/14   Ellyn Hack, MD  lisinopril (PRINIVIL,ZESTRIL) 40 MG tablet Take 1 tablet (40 mg total) by mouth daily. Patient not taking: Reported on 09/05/2021 10/03/14   Ellyn Hack, MD  triamcinolone cream (KENALOG) 0.1 % Apply 1 application topically 2 (two) times daily. Patient not taking: Reported on 09/05/2021 10/03/14   Ellyn Hack, MD    No Known Allergies  Social  History   Tobacco Use   Smoking status: Every Day    Types: Cigars   Smokeless tobacco: Never  Substance Use Topics   Alcohol use: Not Currently   Drug use: Not Currently    Types: Cocaine, Marijuana    Comment: years ago    Family History  Problem Relation Age of Onset   Hepatitis C Brother      Objective:   Vitals:   03/30/22 1616  BP: (!) 166/94  Pulse: 76  Temp: 97.8 F (36.6 C)    Constitutional: in no apparent distress, alert, and oriented times 3 Eyes: anicteric Cardiovascular: Cor RRR Respiratory: clear Gastrointestinal: Bowel sounds are normal, liver is not enlarged, spleen is not enlarged Musculoskeletal: peripheral pulses normal, no pedal edema, no clubbing or cyanosis Skin: negative for - jaundice, spider hemangioma, telangiectasia, palmar erythema, ecchymosis and atrophy; no porphyria cutanea tarda Lymphatic: no cervical lymphadenopathy   Laboratory: 02/17/22 Hep C RNA < 15 copies  01/21/22 Hep C RNA < 15 copies  12/16/21 Hep C RNA 25,700 copies  11/13/2021 Hep C RNA < 15 copies    Imaging:  08/2021: Liver: No focal lesion identified. Coarsened hepatic echotexture with increased echogenicity. Portal vein is patent on color Doppler imaging with normal direction of blood flow towards the liver.   Assessment & Plan:   Problem List Items Addressed This Visit       Unprioritized   Chronic hepatitis C without hepatic coma (HCC) - Primary    Kenneth Flores is here for EOT follow up  after 2nd attempt to eradicate Hep C. Now nearly complete with Vosevi.  He has some coarsened liver findings on U/S and fibrotest F4 but no signs of decompensated disease. Child Pugh A on retrospective check. I don't think we missed this for him, he just failed and was the unfortunately statistic. Alcohol consumption likely did not contribute to failure either (unless is caused him to miss large spans of time with his medication, which I do not suspect at all).   He has tolerated  Vosevi w/o any side effects. His viral loads as expected have been responsive to treatment. Will check HCV RNA and CMP today. RTC in 39m for SVR visit.   He will need [place] screenings going forward with PCP. Will arrange a U/S at return visit + AFP at that lab draw.   Counseled that he should eliminate / reduce ETOH for general liver health - never binge and 1 standard drink a few days a week. This will also help his blood pressure in turn.       Relevant Orders   Hepatitis C RNA quantitative ([facility])   COMPLETE METABOLIC PANEL WITH GFR     Janene Madeira, MSN, NP-C [place] for Infectious Disease [email or web address] Pager: [phone] Office: [phone] [place]: [phone]

## 2022-03-30 NOTE — Patient Instructions (Signed)
See you in May! Will let you know what today's blood work shows.   Finish up your bottle of Vosevi - every pill please!

## 2022-03-31 LAB — COMPLETE METABOLIC PANEL WITH GFR
ALT: 11 U/L (ref 9–46)
AST: 17 U/L (ref 10–35)
Albumin: 4.5 g/dL (ref 3.6–5.1)
CO2: 25 mmol/L (ref 20–32)
Creat: 0.83 mg/dL (ref 0.70–1.30)
Total Protein: 8 g/dL (ref 6.1–8.1)
eGFR: 101 mL/min/{1.73_m2} (ref >=60)

## 2022-04-01 LAB — COMPLETE METABOLIC PANEL WITH GFR
AG Ratio: 1.3 (calc) (ref 1.0–2.5)
Alkaline phosphatase (APISO): 66 U/L (ref 35–144)
BUN: 13 mg/dL (ref 7–25)
Calcium: 9.6 mg/dL (ref 8.6–10.3)
Chloride: 109 mmol/L (ref 98–110)
Globulin: 3.5 g/dL (calc) (ref 1.9–3.7)
Glucose, Bld: 86 mg/dL (ref 65–99)
Potassium: 4.1 mmol/L (ref 3.5–5.3)
Sodium: 144 mmol/L (ref 135–146)
Total Bilirubin: 0.5 mg/dL (ref 0.2–1.2)

## 2022-04-01 LAB — HEPATITIS C RNA QUANTITATIVE
HCV Quantitative Log: <1.18 log IU/mL
HCV RNA, PCR, QN: <15 IU/mL

## 2022-08-18 DIAGNOSIS — S199XXA Unspecified injury of neck, initial encounter: Secondary | ICD-10-CM

## 2022-08-18 HISTORY — DX: Unspecified injury of neck, initial encounter: S19.9XXA

## 2022-08-19 ENCOUNTER — Emergency Department (HOSPITAL_COMMUNITY): Payer: PRIVATE HEALTH INSURANCE

## 2022-08-19 ENCOUNTER — Encounter (HOSPITAL_COMMUNITY): Payer: Self-pay | Admitting: Emergency Medicine

## 2022-08-19 ENCOUNTER — Other Ambulatory Visit: Payer: Self-pay

## 2022-08-19 ENCOUNTER — Inpatient Hospital Stay (HOSPITAL_COMMUNITY)
Admission: EM | Admit: 2022-08-19 | Discharge: 2022-08-27 | DRG: 029 | Disposition: A | Discharge status: Rehabilitation Facility | Payer: PRIVATE HEALTH INSURANCE | Attending: Neurosurgery | Admitting: Neurosurgery

## 2022-08-19 DIAGNOSIS — M2578 Osteophyte, vertebrae: Secondary | ICD-10-CM | POA: Diagnosis present

## 2022-08-19 DIAGNOSIS — R55 Syncope and collapse: Secondary | ICD-10-CM | POA: Diagnosis present

## 2022-08-19 DIAGNOSIS — N319 Neuromuscular dysfunction of bladder, unspecified: Secondary | ICD-10-CM | POA: Diagnosis present

## 2022-08-19 DIAGNOSIS — F17290 Nicotine dependence, other tobacco product, uncomplicated: Secondary | ICD-10-CM | POA: Diagnosis present

## 2022-08-19 DIAGNOSIS — Z8619 Personal history of other infectious and parasitic diseases: Secondary | ICD-10-CM

## 2022-08-19 DIAGNOSIS — Z1152 Encounter for screening for COVID-19: Secondary | ICD-10-CM

## 2022-08-19 DIAGNOSIS — M792 Neuralgia and neuritis, unspecified: Secondary | ICD-10-CM | POA: Diagnosis not present

## 2022-08-19 DIAGNOSIS — B1920 Unspecified viral hepatitis C without hepatic coma: Secondary | ICD-10-CM | POA: Diagnosis not present

## 2022-08-19 DIAGNOSIS — K5900 Constipation, unspecified: Secondary | ICD-10-CM | POA: Diagnosis not present

## 2022-08-19 DIAGNOSIS — F4321 Adjustment disorder with depressed mood: Secondary | ICD-10-CM | POA: Diagnosis not present

## 2022-08-19 DIAGNOSIS — E871 Hypo-osmolality and hyponatremia: Secondary | ICD-10-CM | POA: Diagnosis not present

## 2022-08-19 DIAGNOSIS — S14155S Other incomplete lesion at C5 level of cervical spinal cord, sequela: Secondary | ICD-10-CM | POA: Diagnosis not present

## 2022-08-19 DIAGNOSIS — G4700 Insomnia, unspecified: Secondary | ICD-10-CM | POA: Diagnosis not present

## 2022-08-19 DIAGNOSIS — M4712 Other spondylosis with myelopathy, cervical region: Secondary | ICD-10-CM | POA: Diagnosis present

## 2022-08-19 DIAGNOSIS — S14109A Unspecified injury at unspecified level of cervical spinal cord, initial encounter: Secondary | ICD-10-CM | POA: Diagnosis present

## 2022-08-19 DIAGNOSIS — R39198 Other difficulties with micturition: Secondary | ICD-10-CM | POA: Diagnosis present

## 2022-08-19 DIAGNOSIS — M4802 Spinal stenosis, cervical region: Secondary | ICD-10-CM | POA: Diagnosis present

## 2022-08-19 DIAGNOSIS — I10 Essential (primary) hypertension: Secondary | ICD-10-CM | POA: Diagnosis not present

## 2022-08-19 DIAGNOSIS — W19XXXA Unspecified fall, initial encounter: Secondary | ICD-10-CM | POA: Diagnosis present

## 2022-08-19 DIAGNOSIS — F17210 Nicotine dependence, cigarettes, uncomplicated: Secondary | ICD-10-CM | POA: Diagnosis not present

## 2022-08-19 DIAGNOSIS — S14155D Other incomplete lesion at C5 level of cervical spinal cord, subsequent encounter: Secondary | ICD-10-CM | POA: Diagnosis not present

## 2022-08-19 DIAGNOSIS — M47812 Spondylosis without myelopathy or radiculopathy, cervical region: Secondary | ICD-10-CM | POA: Diagnosis not present

## 2022-08-19 DIAGNOSIS — S14154A Other incomplete lesion at C4 level of cervical spinal cord, initial encounter: Secondary | ICD-10-CM | POA: Diagnosis present

## 2022-08-19 DIAGNOSIS — S14129A Central cord syndrome at unspecified level of cervical spinal cord, initial encounter: Principal | ICD-10-CM

## 2022-08-19 DIAGNOSIS — R338 Other retention of urine: Secondary | ICD-10-CM | POA: Diagnosis present

## 2022-08-19 DIAGNOSIS — S14159A Other incomplete lesion at unspecified level of cervical spinal cord, initial encounter: Secondary | ICD-10-CM | POA: Diagnosis not present

## 2022-08-19 DIAGNOSIS — K592 Neurogenic bowel, not elsewhere classified: Secondary | ICD-10-CM | POA: Diagnosis not present

## 2022-08-19 DIAGNOSIS — G8254 Quadriplegia, C5-C7 incomplete: Secondary | ICD-10-CM | POA: Diagnosis not present

## 2022-08-19 DIAGNOSIS — G8250 Quadriplegia, unspecified: Secondary | ICD-10-CM | POA: Diagnosis not present

## 2022-08-19 LAB — LACTIC ACID, PLASMA
Lactic Acid, Venous: 2 mmol/L (ref 0.5–1.9)
Lactic Acid, Venous: 1 mmol/L (ref 0.5–1.9)

## 2022-08-19 LAB — URINALYSIS, W/ REFLEX TO CULTURE (INFECTION SUSPECTED)
Bacteria, UA: NONE SEEN
Bilirubin Urine: NEGATIVE
Glucose, UA: NEGATIVE mg/dL
Hgb urine dipstick: NEGATIVE
Ketones, ur: 5 mg/dL — AB
Leukocytes,Ua: NEGATIVE
Nitrite: NEGATIVE
Protein, ur: NEGATIVE mg/dL
Specific Gravity, Urine: 1.035 — ABNORMAL HIGH (ref 1.005–1.030)
pH: 6 (ref 5.0–8.0)

## 2022-08-19 LAB — I-STAT CHEM 8, ED
BUN: 15 mg/dL (ref 6–20)
Calcium, Ion: 1.07 mmol/L — ABNORMAL LOW (ref 1.15–1.40)
Chloride: 105 mmol/L (ref 98–111)
Creatinine, Ser: 0.9 mg/dL (ref 0.61–1.24)
Glucose, Bld: 90 mg/dL (ref 70–99)
HCT: 49 % (ref 39.0–52.0)
Hemoglobin: 16.7 g/dL (ref 13.0–17.0)
Potassium: 4 mmol/L (ref 3.5–5.1)
Sodium: 138 mmol/L (ref 135–145)
TCO2: 22 mmol/L (ref 22–32)

## 2022-08-19 LAB — SAMPLE TO BLOOD BANK

## 2022-08-19 LAB — CBC
HCT: 46.5 % (ref 39.0–52.0)
Hemoglobin: 16.4 g/dL (ref 13.0–17.0)
MCH: 33.8 pg (ref 26.0–34.0)
MCHC: 35.3 g/dL (ref 30.0–36.0)
MCV: 95.9 fL (ref 80.0–100.0)
Platelets: 127 10*3/uL — ABNORMAL LOW (ref 150–400)
RBC: 4.85 MIL/uL (ref 4.22–5.81)
RDW: 12.7 % (ref 11.5–15.5)
WBC: 12.2 10*3/uL — ABNORMAL HIGH (ref 4.0–10.5)
nRBC: 0 % (ref 0.0–0.2)

## 2022-08-19 LAB — COMPREHENSIVE METABOLIC PANEL
ALT: 14 U/L (ref 0–44)
AST: 26 U/L (ref 15–41)
Albumin: 3.9 g/dL (ref 3.5–5.0)
Alkaline Phosphatase: 65 U/L (ref 38–126)
Anion gap: 18 — ABNORMAL HIGH (ref 5–15)
BUN: 14 mg/dL (ref 6–20)
CO2: 19 mmol/L — ABNORMAL LOW (ref 22–32)
Calcium: 9.3 mg/dL (ref 8.9–10.3)
Chloride: 101 mmol/L (ref 98–111)
Creatinine, Ser: 0.95 mg/dL (ref 0.61–1.24)
GFR, Estimated: >60 mL/min (ref >60)
Glucose, Bld: 88 mg/dL (ref 70–99)
Potassium: 4.2 mmol/L (ref 3.5–5.1)
Sodium: 138 mmol/L (ref 135–145)
Total Bilirubin: 0.6 mg/dL (ref 0.3–1.2)
Total Protein: 7.5 g/dL (ref 6.5–8.1)

## 2022-08-19 LAB — PROTIME-INR
INR: 1 (ref 0.8–1.2)
Prothrombin Time: 13.6 seconds (ref 11.4–15.2)

## 2022-08-19 LAB — RESP PANEL BY RT-PCR (RSV, FLU A&B, COVID)  RVPGX2
Influenza A by PCR: NEGATIVE
Influenza B by PCR: NEGATIVE
Resp Syncytial Virus by PCR: NEGATIVE
SARS Coronavirus 2 by RT PCR: NEGATIVE

## 2022-08-19 LAB — TROPONIN I (HIGH SENSITIVITY)
Troponin I (High Sensitivity): 4 ng/L (ref <18)
Troponin I (High Sensitivity): 4 ng/L (ref <18)

## 2022-08-19 LAB — ETHANOL: Alcohol, Ethyl (B): 26 mg/dL — ABNORMAL HIGH (ref <10)

## 2022-08-19 LAB — CK: Total CK: 130 U/L (ref 49–397)

## 2022-08-19 MED ORDER — FENTANYL CITRATE PF 50 MCG/ML IJ SOSY
50.0000 ug | PREFILLED_SYRINGE | Freq: Once | INTRAMUSCULAR | Status: AC
  Administered 2022-08-19: 50 ug via INTRAVENOUS
  Filled 2022-08-19: qty 1

## 2022-08-19 MED ORDER — DOCUSATE SODIUM 100 MG PO CAPS
100.0000 mg | ORAL_CAPSULE | Freq: Two times a day (BID) | ORAL | Status: DC
  Administered 2022-08-19 – 2022-08-27 (×17): 100 mg via ORAL
  Filled 2022-08-19 (×17): qty 1

## 2022-08-19 MED ORDER — SODIUM CHLORIDE 0.9 % IV SOLN
1000.0000 mL | INTRAVENOUS | Status: DC
  Administered 2022-08-19 – 2022-08-21 (×5): 1000 mL via INTRAVENOUS

## 2022-08-19 MED ORDER — ACETAMINOPHEN 10 MG/ML IV SOLN
1000.0000 mg | Freq: Once | INTRAVENOUS | Status: AC
  Administered 2022-08-19: 1000 mg via INTRAVENOUS
  Filled 2022-08-19: qty 100

## 2022-08-19 MED ORDER — DEXAMETHASONE SODIUM PHOSPHATE 10 MG/ML IJ SOLN
10.0000 mg | Freq: Four times a day (QID) | INTRAMUSCULAR | Status: DC
  Administered 2022-08-19 – 2022-08-21 (×10): 10 mg via INTRAVENOUS
  Filled 2022-08-19 (×11): qty 1

## 2022-08-19 MED ORDER — IOHEXOL 350 MG/ML SOLN
75.0000 mL | Freq: Once | INTRAVENOUS | Status: AC | PRN
  Administered 2022-08-19: 75 mL via INTRAVENOUS

## 2022-08-19 MED ORDER — ACETAMINOPHEN 650 MG RE SUPP: 650.0000 mg | Freq: Four times a day (QID) | RECTAL | Status: DC | PRN

## 2022-08-19 MED ORDER — SODIUM CHLORIDE 0.9 % IV SOLN
1.0000 g | Freq: Once | INTRAVENOUS | Status: AC
  Administered 2022-08-19: 1 g via INTRAVENOUS
  Filled 2022-08-19: qty 10

## 2022-08-19 MED ORDER — ONDANSETRON HCL 4 MG PO TABS: 4.0000 mg | ORAL_TABLET | Freq: Four times a day (QID) | ORAL | Status: DC | PRN

## 2022-08-19 MED ORDER — SODIUM CHLORIDE 0.9 % IV BOLUS (SEPSIS)
1000.0000 mL | Freq: Once | INTRAVENOUS | Status: AC
  Administered 2022-08-19: 1000 mL via INTRAVENOUS

## 2022-08-19 MED ORDER — POLYETHYLENE GLYCOL 3350 17 G PO PACK
17.0000 g | PACK | Freq: Every day | ORAL | Status: DC | PRN
  Administered 2022-08-23: 17 g via ORAL
  Filled 2022-08-19 (×2): qty 1

## 2022-08-19 MED ORDER — OXYCODONE HCL 5 MG PO TABS
5.0000 mg | ORAL_TABLET | ORAL | Status: DC | PRN
  Administered 2022-08-19 – 2022-08-21 (×8): 5 mg via ORAL
  Filled 2022-08-19 (×8): qty 1

## 2022-08-19 MED ORDER — METHOCARBAMOL 1000 MG/10ML IJ SOLN
500.0000 mg | Freq: Four times a day (QID) | INTRAVENOUS | Status: DC | PRN
  Administered 2022-08-19 – 2022-08-20 (×3): 500 mg via INTRAVENOUS
  Filled 2022-08-19 (×3): qty 500

## 2022-08-19 MED ORDER — ACETAMINOPHEN 325 MG PO TABS
650.0000 mg | ORAL_TABLET | Freq: Four times a day (QID) | ORAL | Status: DC | PRN
  Administered 2022-08-19 – 2022-08-21 (×5): 650 mg via ORAL
  Filled 2022-08-19 (×5): qty 2

## 2022-08-19 MED ORDER — GABAPENTIN 300 MG PO CAPS
300.0000 mg | ORAL_CAPSULE | Freq: Three times a day (TID) | ORAL | Status: DC
  Administered 2022-08-19 – 2022-08-27 (×25): 300 mg via ORAL
  Filled 2022-08-19 (×25): qty 1

## 2022-08-19 MED ORDER — ONDANSETRON HCL 4 MG/2ML IJ SOLN
4.0000 mg | Freq: Four times a day (QID) | INTRAMUSCULAR | Status: DC | PRN
  Administered 2022-08-19 – 2022-08-21 (×2): 4 mg via INTRAVENOUS
  Filled 2022-08-19 (×2): qty 2

## 2022-08-19 MED ORDER — HYDROMORPHONE HCL 1 MG/ML IJ SOLN
0.5000 mg | INTRAMUSCULAR | Status: DC | PRN
  Administered 2022-08-19 – 2022-08-21 (×11): 1 mg via INTRAVENOUS
  Filled 2022-08-19 (×12): qty 1

## 2022-08-19 NOTE — TOC CAGE-AID Note (Signed)
Transition of Care [facility]) - CAGE-AID Screening   Patient Details  Name: Kenneth Flores MRN: [id number] Date of Birth: 15-Oct-1960  [facility] of [facility]) CM/SW Contact:    Judie Bonus, RN Phone Number: 08/19/2022, 7:11 AM   Clinical Narrative:  Pt reports cigar smoking, remote drug use, denies etoh usage (etoh 26 today). Denies needing resources.   CAGE-AID Screening:    Have You Ever Felt You Ought to Cut Down on Your Drinking or Drug Use?: No Have People Annoyed You By Critizing Your Drinking Or Drug Use?: No Have You Felt Bad Or Guilty About Your Drinking Or Drug Use?: No Have You Ever Had a Drink or Used Drugs First Thing In The Morning to Steady Your Nerves or to Get Rid of a Hangover?: No CAGE-AID Score: 0

## 2022-08-19 NOTE — ED Notes (Signed)
ED TO INPATIENT HANDOFF REPORT  ED Nurse Name and Phone #: Britt Boozer [phone]  S Name/Age/Gender Kenneth Flores 62 y.o. male Room/Bed: 003C/003C  Code Status   Code Status: Full Code  Home/SNF/Other Home Patient oriented to: self, place, time, and situation Is this baseline? Yes   Triage Complete: Triage complete  Chief Complaint Cervical spinal cord injury [facility]) [S14.109A]  Triage Note Pt in from home via [facility] as Level 2 syncopal fall, while walking to the bathroom tonight. Pt states he thinks that he struck his posterior neck and R shoulder vs marble countertop when he fell. [facility] reports that pt has weakness bilaterally to all 4 extremities, warm to touch. Pt c/o midline neck tenderness and R shoulder pain that radiates to L shoulder,  Manual pressure 156/94 on arrival   Allergies No Known Allergies  Level of Care/Admitting Diagnosis ED Disposition     ED Disposition  Admit   Condition  --   Comment  Hospital Area: [facility] [[id number]]  Level of Care: Med-Surg [16]  May admit patient to [facility] or [facility] if equivalent level of care is available:: No  Covid Evaluation: Asymptomatic - no recent exposure (last 10 days) testing not required  Diagnosis: Cervical spinal cord injury [facility]) [[id number]]  Admitting Physician: Julio Sicks [phone]  Attending Physician: Julio Sicks [[id number]]  Bed request comments: 3w  Certification:: I certify this patient will need inpatient services for at least 2 midnights  Estimated Length of Stay: 5          B Medical/Surgery History Past Medical History:  Diagnosis Date   Hepatitis C    Past Surgical History:  Procedure Laterality Date   NO PAST SURGERIES       A IV Location/Drains/Wounds Patient Lines/Drains/Airways Status     Active Line/Drains/Airways     Name Placement date Placement time Site Days   Peripheral IV 08/19/22 18 G Left Antecubital 08/19/22  0021  Antecubital  less than 1   Peripheral IV  08/19/22 18 G Posterior;Right Forearm 08/19/22  0053  Forearm  less than 1   Peripheral IV 08/19/22 20 G Posterior;Right Hand 08/19/22  0057  Hand  less than 1            Intake/Output Last 24 hours  Intake/Output Summary (Last 24 hours) at 08/19/2022 1759 Last data filed at 08/19/2022 0442 Gross per 24 hour  Intake 1100 ml  Output --  Net 1100 ml    Labs/Imaging Results for orders placed or performed during the hospital encounter of 08/19/22 (from the past 48 hour(s))  I-Stat Chem 8, ED     Status: Abnormal   Collection Time: 08/19/22 12:55 AM  Result Value Ref Range   Sodium 138 135 - 145 mmol/L   Potassium 4.0 3.5 - 5.1 mmol/L   Chloride 105 98 - 111 mmol/L   BUN 15 6 - 20 mg/dL   Creatinine, Ser 2.13 0.61 - 1.24 mg/dL   Glucose, Bld 90 70 - 99 mg/dL    Comment: Glucose reference range applies only to samples taken after fasting for at least 8 hours.   Calcium, Ion 1.07 (L) 1.15 - 1.40 mmol/L   TCO2 22 22 - 32 mmol/L   Hemoglobin 16.7 13.0 - 17.0 g/dL   HCT 08.6 57.8 - 46.9 %  Comprehensive metabolic panel     Status: Abnormal   Collection Time: 08/19/22 12:56 AM  Result Value Ref Range   Sodium 138 135 - 145 mmol/L  Potassium 4.2 3.5 - 5.1 mmol/L   Chloride 101 98 - 111 mmol/L   CO2 19 (L) 22 - 32 mmol/L   Glucose, Bld 88 70 - 99 mg/dL    Comment: Glucose reference range applies only to samples taken after fasting for at least 8 hours.   BUN 14 6 - 20 mg/dL   Creatinine, Ser 1.61 0.61 - 1.24 mg/dL   Calcium 9.3 8.9 - 09.6 mg/dL   Total Protein 7.5 6.5 - 8.1 g/dL   Albumin 3.9 3.5 - 5.0 g/dL   AST 26 15 - 41 U/L   ALT 14 0 - 44 U/L   Alkaline Phosphatase 65 38 - 126 U/L   Total Bilirubin 0.6 0.3 - 1.2 mg/dL   GFR, Estimated >04 >54 mL/min    Comment: (NOTE) Calculated using the CKD-EPI Creatinine Equation (2021)    Anion gap 18 (H) 5 - 15    Comment: Performed at [facility], [address] [place], [place] [address]  CBC     Status: Abnormal    Collection Time: 08/19/22 12:56 AM  Result Value Ref Range   WBC 12.2 (H) 4.0 - 10.5 K/uL   RBC 4.85 4.22 - 5.81 MIL/uL   Hemoglobin 16.4 13.0 - 17.0 g/dL   HCT 91.4 78.2 - 95.6 %   MCV 95.9 80.0 - 100.0 fL   MCH 33.8 26.0 - 34.0 pg   MCHC 35.3 30.0 - 36.0 g/dL   RDW 21.3 08.6 - 57.8 %   Platelets 127 (L) 150 - 400 K/uL   nRBC 0.0 0.0 - 0.2 %    Comment: Performed at [facility], [address], [place], [place] [address]  Ethanol     Status: Abnormal   Collection Time: 08/19/22 12:56 AM  Result Value Ref Range   Alcohol, Ethyl (B) 26 (H) <10 mg/dL    Comment: (NOTE) Lowest detectable limit for serum alcohol is 10 mg/dL.  For medical purposes only. Performed at [facility], [address] [place], [place] [address]   Protime-INR     Status: None   Collection Time: 08/19/22 12:56 AM  Result Value Ref Range   Prothrombin Time 13.6 11.4 - 15.2 seconds   INR 1.0 0.8 - 1.2    Comment: (NOTE) INR goal varies based on device and disease states. Performed at [facility], [address], [place], [place] [address]   Sample to Blood Bank     Status: None   Collection Time: 08/19/22 12:56 AM  Result Value Ref Range   Blood Bank Specimen SAMPLE AVAILABLE FOR TESTING    Sample Expiration      08/22/2022,2359 Performed at [facility], [address], [place], [place] [address]   CK     Status: None   Collection Time: 08/19/22 12:56 AM  Result Value Ref Range   Total CK 130 49 - 397 U/L    Comment: Performed at [facility], [address], [place], [place] [address]  Troponin I (High Sensitivity)     Status: None   Collection Time: 08/19/22 12:56 AM  Result Value Ref Range   Troponin I (High Sensitivity) 4 <18 ng/L    Comment: (NOTE) Elevated high sensitivity troponin I (hsTnI) values and significant  changes across serial measurements may suggest ACS but many other  chronic and acute conditions are known to elevate hsTnI results.  Refer to  the "Links" section for chest pain  algorithms and additional  guidance. Performed at [facility], [address], [place], [place] [address]   Lactic acid, plasma     Status: Abnormal   Collection Time: 08/19/22 12:59 AM  Result Value Ref Range   Lactic Acid, Venous 2.0 (HH) 0.5 - 1.9 mmol/L    Comment: CRITICAL RESULT CALLED TO, READ BACK BY AND VERIFIED WITH TYLER WALSTON RN 08/19/22 @0203  BY J. WHITE Performed at [facility], [address] [place], [place] [address]   Resp panel by RT-PCR (RSV, Flu A&B, Covid) Anterior Nasal Swab     Status: None   Collection Time: 08/19/22 12:59 AM   Specimen: Anterior Nasal Swab  Result Value Ref Range   SARS Coronavirus 2 by RT PCR NEGATIVE NEGATIVE   Influenza A by PCR NEGATIVE NEGATIVE   Influenza B by PCR NEGATIVE NEGATIVE    Comment: (NOTE) The Xpert Xpress SARS-CoV-2/FLU/RSV plus assay is intended as an aid in the diagnosis of influenza from Nasopharyngeal swab specimens and should not be used as a sole basis for treatment. Nasal washings and aspirates are unacceptable for Xpert Xpress SARS-CoV-2/FLU/RSV testing.  Fact Sheet for Patients: BloggerCourse.com  Fact Sheet for Healthcare Providers: SeriousBroker.it  This test is not yet approved or cleared by the [place] FDA and has been authorized for detection and/or diagnosis of SARS-CoV-2 by FDA under an Emergency Use Authorization (EUA). This EUA will remain in effect (meaning this test can be used) for the duration of the COVID-19 declaration under Section 564(b)(1) of the Act, 21 U.S.C. section 360bbb-3(b)(1), unless the authorization is terminated or revoked.     Resp Syncytial Virus by PCR NEGATIVE NEGATIVE    Comment: (NOTE) Fact Sheet for Patients: BloggerCourse.com  Fact Sheet for Healthcare Providers: SeriousBroker.it  This test is not yet approved  or cleared by the [place] FDA and has been authorized for detection and/or diagnosis of SARS-CoV-2 by FDA under an Emergency Use Authorization (EUA). This EUA will remain in effect (meaning this test can be used) for the duration of the COVID-19 declaration under Section 564(b)(1) of the Act, 21 U.S.C. section 360bbb-3(b)(1), unless the authorization is terminated or revoked.  Performed at [facility], [address] [place], [place] [address]   Lactic acid, plasma     Status: None   Collection Time: 08/19/22  2:29 AM  Result Value Ref Range   Lactic Acid, Venous 1.0 0.5 - 1.9 mmol/L    Comment: Performed at [facility], [address] [place], [place] [address]  Troponin I (High Sensitivity)     Status: None   Collection Time: 08/19/22  2:29 AM  Result Value Ref Range   Troponin I (High Sensitivity) 4 <18 ng/L    Comment: (NOTE) Elevated high sensitivity troponin I (hsTnI) values and significant  changes across serial measurements may suggest ACS but many other  chronic and acute conditions are known to elevate hsTnI results.  Refer to the "Links" section for chest pain algorithms and additional  guidance. Performed at [facility], [address] [place], [place] [address]   Urinalysis, w/ Reflex to Culture (Infection Suspected) -Urine, Clean Catch     Status: Abnormal   Collection Time: 08/19/22  4:45 AM  Result Value Ref Range   Specimen Source URINE, CLEAN CATCH    Color, Urine YELLOW YELLOW   APPearance CLEAR CLEAR   Specific Gravity, Urine 1.035 (H) 1.005 - 1.030   pH 6.0  5.0 - 8.0   Glucose, UA NEGATIVE NEGATIVE mg/dL   Hgb urine dipstick NEGATIVE NEGATIVE   Bilirubin Urine NEGATIVE NEGATIVE   Ketones, ur 5 (A) NEGATIVE mg/dL   Protein, ur NEGATIVE NEGATIVE mg/dL   Nitrite NEGATIVE NEGATIVE   Leukocytes,Ua NEGATIVE NEGATIVE   RBC / HPF 0-5 0 - 5 RBC/hpf   WBC, UA 0-5 0 - 5 WBC/hpf    Comment:        Reflex urine culture not  performed if WBC <=10, OR if Squamous epithelial cells >5. If Squamous epithelial cells >5 suggest recollection.    Bacteria, UA NONE SEEN NONE SEEN   Squamous Epithelial / HPF 0-5 0 - 5 /HPF    Comment: Performed at [facility], [address] [place], [place] [address]   MR Cervical Spine Wo Contrast  Result Date: 08/19/2022 CLINICAL DATA:  62 year old male status post fall. Neurologic deficit or paresthesia. EXAM: MRI CERVICAL SPINE WITHOUT CONTRAST TECHNIQUE: Multiplanar, multisequence MR imaging of the cervical spine was performed. No intravenous contrast was administered. COMPARISON:  Cervical spine CT 0117 hours today. Brain MRI [facility] 02/09/2005. FINDINGS: Alignment: Stable with mild reversal of the normal upper cervical lordosis. No significant spondylolisthesis. Vertebrae: No cervical or skull base vertebral marrow edema identified. But there is confluent abnormal prevertebral edema or inflammation from the anterior odontoid through the C5-C6 disc space. Cord: Abnormal cervical spinal cord signal from the mid C3 vertebral body level through the C4-C5 disc space level. 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 However, a component of acute cord contusion cannot be excluded, and there is questionable faint susceptibility within the left paracentral cord on series 9, image 19 GRE. No cord expansion. No abnormal spinal cord signal above or below those levels, with capacious visible thoracic spinal canal. Posterior Fossa, vertebral arteries, paraspinal tissues: Cervicomedullary junction is within normal limits. Scattered small foci of T2 hyperintensity in the cerebellum appear to be small chronic cerebellar infarcts, increased from the 2006 MRI. Maintained major vascular flow voids in the bilateral neck. Prevertebral edema or fluid from just below the anterior C1 ring  through the C6 vertebral body measures up to 6-7 mm in thickness at the C3 and C4 levels. Anterior longitudinal ligament injury is possible. And there is faint interspinous ligament signal abnormality to the right of midline at the C3-C4 level (series 10, image 7). No discrete paraspinal muscle edema identified. Tectorial membrane appears intact. Disc levels: C2-C3: Broad-based posterior disc bulge or protrusion. Mild ligament flavum hypertrophy. Borderline spinal stenosis. No cord mass effect or foraminal stenosis. C3-C4: Disc space loss. Circumferential disc osteophyte complex with broad-based posterior component. Up to moderate spinal stenosis and spinal cord mass effect with associated abnormal cord signal. Mild facet hypertrophy. Moderate left and moderate to severe right C4 foraminal stenosis. C4-C5: Similar disc space loss. Circumferential disc osteophyte complex with broad-based posterior component. Mild spinal stenosis, cord mass effect, and abnormal cord signal as above. Moderate to severe bilateral C5 neural foraminal stenosis. C5-C6: Disc space loss with left eccentric disc osteophyte complex. Mild spinal stenosis and spinal cord mass effect. Moderate to severe left C6 neural foraminal stenosis. C6-C7:  Minor disc bulging.  No stenosis. C7-T1:  Negative. IMPRESSION: 1. Positive for evidence of both Ligamentous Injury, and Abnormal Cervical Spinal Cord in the setting of trauma: - Prevertebral edema or fluid from C1 through C6. Consider anterior longitudinal ligament  injury, although no discrete ligamentous disruption is identified. - mild posterior interspinous ligamentous injury at C3-C4 eccentric to the right. - Abnormal spinal cord signal from C3 to the C5 cord level. While some of this most resembles chronic Myelomalacia (see #2), a component of Acute Cord Contusion cannot be excluded - especially in the left central cord at C3. *Underlying bulky cervical spine degeneration with spinal stenosis AND  spinal cord mass effect from C3-C4 (Moderate) through C5-C6 (mild). Associated moderate to severe degenerative neural foraminal stenosis at the bilateral C4, C5, and left C6 nerve levels. 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 Study discussed by telephone with Dr. Drema Pry on 08/19/2022 at 04:37 . Electronically Signed   By: Odessa Fleming M.D.   On: 08/19/2022 04:41   CT ANGIO HEAD NECK W WO CM  Result Date: 08/19/2022 CLINICAL DATA:  Neck trauma, midline tenderness EXAM: CT ANGIOGRAPHY HEAD AND NECK WITH AND WITHOUT CONTRAST TECHNIQUE: Multidetector CT imaging of the head and neck was performed using the standard protocol during bolus administration of intravenous contrast. Multiplanar CT image reconstructions and MIPs were obtained to evaluate the vascular anatomy. Carotid stenosis measurements (when applicable) are obtained utilizing NASCET criteria, using the distal internal carotid diameter as the denominator. RADIATION DOSE REDUCTION: This exam was performed according to the departmental dose-optimization program which includes automated exposure control, adjustment of the mA and/or kV according to patient size and/or use of iterative reconstruction technique. CONTRAST:  75mL OMNIPAQUE IOHEXOL 350 MG/ML SOLN COMPARISON:  No prior CTA available, correlation is made with CT head 08/19/2022 FINDINGS: CT HEAD FINDINGS For noncontrast findings, please see same day CT head. CTA NECK FINDINGS Aortic arch: Standard branching. Imaged portion shows no evidence of aneurysm or dissection. No significant stenosis of the major arch vessel origins. Right carotid system: No evidence of stenosis, dissection, or occlusion. Left carotid system: No evidence of stenosis, dissection, or occlusion. Vertebral arteries: No evidence of stenosis, dissection, or occlusion. Skeleton: No acute osseous abnormality. Degenerative changes in the cervical spine. Please see same day CT cervical  spine for more detailed findings. Other neck: Negative. Upper chest: No focal pulmonary opacity or pleural effusion. Review of the MIP images confirms the above findings CTA HEAD FINDINGS Anterior circulation: Both internal carotid arteries are patent to the termini, without significant stenosis. A1 segments patent. Normal anterior communicating artery. Anterior cerebral arteries are patent to their distal aspects without significant stenosis. No M1 stenosis or occlusion. MCA branches perfused to their distal aspects without significant stenosis. Posterior circulation: Vertebral arteries patent to the vertebrobasilar junction without significant stenosis. Posterior inferior cerebellar arteries patent proximally. Basilar patent to its distal aspect without significant stenosis. Superior cerebellar arteries patent proximally. Patent left P1. Aplastic right P 1. Fetal origin of the right PCA from the right posterior communicating artery. PCAs perfused to their distal aspects without significant stenosis. The left posterior communicating artery is not visualized. Venous sinuses: As permitted by contrast timing, patent. Anatomic variants: Fetal origin of the right PCA. Review of the MIP images confirms the above findings IMPRESSION: 1. No intracranial large vessel occlusion or significant stenosis. 2. No hemodynamically significant stenosis in the neck. 3. No evidence of vascular injury. Electronically Signed   By: Wiliam Ke M.D.   On: 08/19/2022 02:41   CT HEAD WO CONTRAST  Result Date: 08/19/2022 CLINICAL DATA:  61 year old presents following a fall injury at home, with posterior neck and right shoulder impact. EXAM: CT  HEAD WITHOUT CONTRAST CT CERVICAL SPINE WITHOUT CONTRAST CT CHEST, ABDOMEN AND PELVIS WITH CONTRAST TECHNIQUE: Contiguous axial images were obtained from the base of the skull through the vertex without intravenous contrast. Multidetector CT imaging of the cervical spine was performed without  intravenous contrast. Multiplanar CT image reconstructions were also generated. Multidetector CT imaging of the chest, abdomen and pelvis was performed following the standard protocol during bolus administration of intravenous contrast. RADIATION DOSE REDUCTION: This exam was performed according to the departmental dose-optimization program which includes automated exposure control, adjustment of the mA and/or kV according to patient size and/or use of iterative reconstruction technique. CONTRAST:  75mL OMNIPAQUE IOHEXOL 350 MG/ML SOLN COMPARISON:  CT scan head, cervical spine, and chest, abdomen and pelvis CT with contrast all obtained 12/23/2005. Also right upper quadrant ultrasound 09/10/2021. FINDINGS: CT HEAD FINDINGS Brain: No evidence of acute infarction, hemorrhage, hydrocephalus, extra-axial collection or mass lesion/mass effect. Vascular: No hyperdense vessel or unexpected calcification. Skull: No appreciable scalp hematoma. Negative for fractures or focal lesions. Sinuses/Orbits: Negative orbits. Mild membrane disease in the paranasal sinuses noted without fluid levels. No mastoid effusion. Right deviated nasal septum with right-sided spurring. Other: None. CT CERVICAL FINDINGS Alignment: There is a slight reversal of the usual cervical lordosis. No levels show significant listhesis. Bone-on-bone anterior atlantodental joint space loss is again seen with osteophytes. Skull base and vertebrae: No acute fracture is evident. No primary bone lesion or focal pathologic process. Soft tissues and spinal canal: No prevertebral fluid or swelling. No visible canal hematoma. There are calcifications of the left-greater-than-right carotid bifurcations. No laryngeal mass. Disc levels: There are congenitally short pedicles reducing the effective AP diameter of the thecal sac to as little as 6.5 mm at some levels. The discs are degenerated with bidirectional osteophytes from C3-4 through C5-6, where dorsal disc  osteophyte complexes cause moderate to severe spinal canal stenosis with spondylotic cord compression all 3 levels. Other levels do not show as significant soft tissue or bony encroachment on the thecal sac, but at C2-3 there is also a left paracentral disc herniation with indentation on the ventral surface of the left hemicord. Due to uncinate joint spurring, there is degenerative foraminal stenosis which is moderate on the right at C3-4, bilaterally moderate at C4-5, mild on the left at C5-6. Other:  None. CT CHEST FINDINGS Cardiovascular: The cardiac size is normal. There is no pericardial effusion. There is calcification in the proximal LAD coronary artery only, mild aortic calcific plaques and mild aortic tortuosity. There are minimal calcifications in the great vessels. There is no aortic or great vessel aneurysm, stenosis or dissection. There is no arterial or venous dilatation or central arterial embolus. Mediastinum/Nodes: No enlarged mediastinal, hilar, or axillary lymph nodes. Thyroid gland, trachea, and esophagus demonstrate no significant findings. Both main bronchi are clear. Lungs/Pleura: There are mild centrilobular and paraseptal emphysematous changes in the lung apices. There is no pleural effusion or pneumothorax. There is mild posterior atelectasis and scattered linear scar-like opacities in both bases. No pulmonary contusion or infiltrates are seen. There is new demonstration of a pleural-based rounded noncalcified nodule in the posterolateral base of the right lower lobe measuring 1.2 x 0.9 cm on 6:159, with pleurodiaphragmatic stranding. There previously were contusive changes in the area as well as a right lower lobe laceration posteriorly, and it is possible this could be a nodular scar but it is nonspecific. There is also a newly noted 3 mm right upper lobe suprahilar nodule on 6:70. The remaining  lungs are free of nodules. The remaining lungs are clear. There is eventration and asymmetric  chronic elevation of the anterior right diaphragm. Musculoskeletal: There is chronic healed fracture deformity of the right clavicle, previously acute. There are healed fracture deformities of some of the right ribs. No acute displaced rib fracture or sternal injury is seen. Visualized portions of the shoulder girdles are intact, as visualized. No spinal compression fracture is evident. CT ABDOMEN PELVIS FINDINGS Hepatobiliary: No hepatic injury or perihepatic hematoma. Gallbladder is unremarkable. There is no hepatic mass enhancement and no biliary dilatation. The liver is 19 cm length with slight steatosis. Pancreas: No abnormality. Spleen: No abnormality. No acute traumatic findings. No splenomegaly. Adrenals/Urinary Tract: No adrenal hemorrhage or renal injury identified. Bladder is unremarkable. There is no mass enhancement in the adrenal glands and kidneys. Perinephric stranding is again noted symmetrically. There is no urinary stone or obstruction. Stomach/Bowel: No dilatation or wall thickening including of the appendix. Advanced sigmoid diverticulosis. No findings of acute diverticulitis or colitis. Vascular/Lymphatic: Aortic atherosclerosis. No enlarged abdominal or pelvic lymph nodes. Reproductive: No prostatomegaly. Calcification is again seen in the left lobe. Other: There is no free air, free fluid, free hemorrhage or incarcerated hernia. Multiple pelvic phleboliths Musculoskeletal: There are degenerative changes of the lumbar spine, bridging osteophytes across the left SI joint. Chronic trabecular coarsening with interspersed well corticated lucencies, are again seen in the right iliac wing and unchanged consistent with a chronic process. No regional skeletal fracture is evident. There is a sizable left paracentral L4-5 disc herniation partially effacing the left hemicanal and with likely mass effect at least on the descending left L5 nerve root if not also on the exiting left L4 nerve root in the  proximal left foramen. This was not seen in 2007. IMPRESSION: 1. No acute intracranial CT findings or depressed skull fractures. 2. Cervical degenerative changes without evidence of fractures or listhesis. 3. Moderate to severe spinal canal stenosis with cord compression at C3-4, C4-5, and C5-6 due to short pedicles and dorsal disc osteophyte complexes. 4. No acute trauma related findings in the chest, abdomen or pelvis. 5. 1.2 x 0.9 cm rounded pleural-based nodule in the posterolateral base of the right lower lobe with pleurodiaphragmatic stranding. This could be a nodular scar, but is nonspecific. Consider one of the following in 3 months for both low-risk and high-risk individuals: (a) repeat chest CT, (b) follow-up PET-CT, or (c) tissue sampling. This recommendation follows the consensus statement: Guidelines for Management of Incidental Pulmonary Nodules Detected on CT Images: From the Fleischner Society 2017; Radiology 2017; 284:228-243. 6. 3 mm right upper lobe suprahilar nodule, also new. 7. Aortic and coronary artery atherosclerosis. 8. Diverticulosis without evidence of diverticulitis. 9. 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 Acuity indeterminate. 10. Emphysema. Emphysema (ICD10-J43.9). Electronically Signed   By: Almira Bar M.D.   On: 08/19/2022 02:17   CT CHEST ABDOMEN PELVIS W CONTRAST  Result Date: 08/19/2022 CLINICAL DATA:  62 year old presents following a fall injury at home, with posterior neck and right shoulder impact. EXAM: CT HEAD WITHOUT CONTRAST CT CERVICAL SPINE WITHOUT CONTRAST CT CHEST, ABDOMEN AND PELVIS WITH CONTRAST TECHNIQUE: Contiguous axial images were obtained from the base of the skull through the vertex without intravenous contrast. Multidetector CT imaging of the cervical spine was performed without intravenous contrast. Multiplanar CT image reconstructions  were also generated. Multidetector CT imaging of  the chest, abdomen and pelvis was performed following the standard protocol during bolus administration of intravenous contrast. RADIATION DOSE REDUCTION: This exam was performed according to the departmental dose-optimization program which includes automated exposure control, adjustment of the mA and/or kV according to patient size and/or use of iterative reconstruction technique. CONTRAST:  75mL OMNIPAQUE IOHEXOL 350 MG/ML SOLN COMPARISON:  CT scan head, cervical spine, and chest, abdomen and pelvis CT with contrast all obtained 12/23/2005. Also right upper quadrant ultrasound 09/10/2021. FINDINGS: CT HEAD FINDINGS Brain: No evidence of acute infarction, hemorrhage, hydrocephalus, extra-axial collection or mass lesion/mass effect. Vascular: No hyperdense vessel or unexpected calcification. Skull: No appreciable scalp hematoma. Negative for fractures or focal lesions. Sinuses/Orbits: Negative orbits. Mild membrane disease in the paranasal sinuses noted without fluid levels. No mastoid effusion. Right deviated nasal septum with right-sided spurring. Other: None. CT CERVICAL FINDINGS Alignment: There is a slight reversal of the usual cervical lordosis. No levels show significant listhesis. Bone-on-bone anterior atlantodental joint space loss is again seen with osteophytes. Skull base and vertebrae: No acute fracture is evident. No primary bone lesion or focal pathologic process. Soft tissues and spinal canal: No prevertebral fluid or swelling. No visible canal hematoma. There are calcifications of the left-greater-than-right carotid bifurcations. No laryngeal mass. Disc levels: There are congenitally short pedicles reducing the effective AP diameter of the thecal sac to as little as 6.5 mm at some levels. The discs are degenerated with bidirectional osteophytes from C3-4 through C5-6, where dorsal disc osteophyte complexes cause moderate to severe spinal canal stenosis  with spondylotic cord compression all 3 levels. Other levels do not show as significant soft tissue or bony encroachment on the thecal sac, but at C2-3 there is also a left paracentral disc herniation with indentation on the ventral surface of the left hemicord. Due to uncinate joint spurring, there is degenerative foraminal stenosis which is moderate on the right at C3-4, bilaterally moderate at C4-5, mild on the left at C5-6. Other:  None. CT CHEST FINDINGS Cardiovascular: The cardiac size is normal. There is no pericardial effusion. There is calcification in the proximal LAD coronary artery only, mild aortic calcific plaques and mild aortic tortuosity. There are minimal calcifications in the great vessels. There is no aortic or great vessel aneurysm, stenosis or dissection. There is no arterial or venous dilatation or central arterial embolus. Mediastinum/Nodes: No enlarged mediastinal, hilar, or axillary lymph nodes. Thyroid gland, trachea, and esophagus demonstrate no significant findings. Both main bronchi are clear. Lungs/Pleura: There are mild centrilobular and paraseptal emphysematous changes in the lung apices. There is no pleural effusion or pneumothorax. There is mild posterior atelectasis and scattered linear scar-like opacities in both bases. No pulmonary contusion or infiltrates are seen. There is new demonstration of a pleural-based rounded noncalcified nodule in the posterolateral base of the right lower lobe measuring 1.2 x 0.9 cm on 6:159, with pleurodiaphragmatic stranding. There previously were contusive changes in the area as well as a right lower lobe laceration posteriorly, and it is possible this could be a nodular scar but it is nonspecific. There is also a newly noted 3 mm right upper lobe suprahilar nodule on 6:70. The remaining lungs are free of nodules. The remaining lungs are clear. There is eventration and asymmetric chronic elevation of the anterior right diaphragm. Musculoskeletal:  There is chronic healed fracture deformity of the right clavicle, previously acute. There are healed fracture deformities of some of the right ribs. No acute displaced rib fracture or sternal injury is seen.  Visualized portions of the shoulder girdles are intact, as visualized. No spinal compression fracture is evident. CT ABDOMEN PELVIS FINDINGS Hepatobiliary: No hepatic injury or perihepatic hematoma. Gallbladder is unremarkable. There is no hepatic mass enhancement and no biliary dilatation. The liver is 19 cm length with slight steatosis. Pancreas: No abnormality. Spleen: No abnormality. No acute traumatic findings. No splenomegaly. Adrenals/Urinary Tract: No adrenal hemorrhage or renal injury identified. Bladder is unremarkable. There is no mass enhancement in the adrenal glands and kidneys. Perinephric stranding is again noted symmetrically. There is no urinary stone or obstruction. Stomach/Bowel: No dilatation or wall thickening including of the appendix. Advanced sigmoid diverticulosis. No findings of acute diverticulitis or colitis. Vascular/Lymphatic: Aortic atherosclerosis. No enlarged abdominal or pelvic lymph nodes. Reproductive: No prostatomegaly. Calcification is again seen in the left lobe. Other: There is no free air, free fluid, free hemorrhage or incarcerated hernia. Multiple pelvic phleboliths Musculoskeletal: There are degenerative changes of the lumbar spine, bridging osteophytes across the left SI joint. Chronic trabecular coarsening with interspersed well corticated lucencies, are again seen in the right iliac wing and unchanged consistent with a chronic process. No regional skeletal fracture is evident. There is a sizable left paracentral L4-5 disc herniation partially effacing the left hemicanal and with likely mass effect at least on the descending left L5 nerve root if not also on the exiting left L4 nerve root in the proximal left foramen. This was not seen in 2007. IMPRESSION: 1. No  acute intracranial CT findings or depressed skull fractures. 2. Cervical degenerative changes without evidence of fractures or listhesis. 3. Moderate to severe spinal canal stenosis with cord compression at C3-4, C4-5, and C5-6 due to short pedicles and dorsal disc osteophyte complexes. 4. No acute trauma related findings in the chest, abdomen or pelvis. 5. 1.2 x 0.9 cm rounded pleural-based nodule in the posterolateral base of the right lower lobe with pleurodiaphragmatic stranding. This could be a nodular scar, but is nonspecific. Consider one of the following in 3 months for both low-risk and high-risk individuals: (a) repeat chest CT, (b) follow-up PET-CT, or (c) tissue sampling. This recommendation follows the consensus statement: Guidelines for Management of Incidental Pulmonary Nodules Detected on CT Images: From the Fleischner Society 2017; Radiology 2017; 284:228-243. 6. 3 mm right upper lobe suprahilar nodule, also new. 7. Aortic and coronary artery atherosclerosis. 8. Diverticulosis without evidence of diverticulitis. 9. 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 Acuity indeterminate. 10. Emphysema. Emphysema (ICD10-J43.9). Electronically Signed   By: Almira Bar M.D.   On: 08/19/2022 02:17   CT C-SPINE NO CHARGE  Result Date: 08/19/2022 CLINICAL DATA:  62 year old presents following a fall injury at home, with posterior neck and right shoulder impact. EXAM: CT HEAD WITHOUT CONTRAST CT CERVICAL SPINE WITHOUT CONTRAST CT CHEST, ABDOMEN AND PELVIS WITH CONTRAST TECHNIQUE: Contiguous axial images were obtained from the base of the skull through the vertex without intravenous contrast. Multidetector CT imaging of the cervical spine was performed without intravenous contrast. Multiplanar CT image reconstructions were also generated. Multidetector CT imaging of the chest, abdomen and pelvis was  performed following the standard protocol during bolus administration of intravenous contrast. RADIATION DOSE REDUCTION: This exam was performed according to the departmental dose-optimization program which includes automated exposure control, adjustment of the mA and/or kV according to patient size and/or use of iterative reconstruction technique. CONTRAST:  75mL OMNIPAQUE IOHEXOL 350 MG/ML SOLN  COMPARISON:  CT scan head, cervical spine, and chest, abdomen and pelvis CT with contrast all obtained 12/23/2005. Also right upper quadrant ultrasound 09/10/2021. FINDINGS: CT HEAD FINDINGS Brain: No evidence of acute infarction, hemorrhage, hydrocephalus, extra-axial collection or mass lesion/mass effect. Vascular: No hyperdense vessel or unexpected calcification. Skull: No appreciable scalp hematoma. Negative for fractures or focal lesions. Sinuses/Orbits: Negative orbits. Mild membrane disease in the paranasal sinuses noted without fluid levels. No mastoid effusion. Right deviated nasal septum with right-sided spurring. Other: None. CT CERVICAL FINDINGS Alignment: There is a slight reversal of the usual cervical lordosis. No levels show significant listhesis. Bone-on-bone anterior atlantodental joint space loss is again seen with osteophytes. Skull base and vertebrae: No acute fracture is evident. No primary bone lesion or focal pathologic process. Soft tissues and spinal canal: No prevertebral fluid or swelling. No visible canal hematoma. There are calcifications of the left-greater-than-right carotid bifurcations. No laryngeal mass. Disc levels: There are congenitally short pedicles reducing the effective AP diameter of the thecal sac to as little as 6.5 mm at some levels. The discs are degenerated with bidirectional osteophytes from C3-4 through C5-6, where dorsal disc osteophyte complexes cause moderate to severe spinal canal stenosis with spondylotic cord compression all 3 levels. Other levels do not show as  significant soft tissue or bony encroachment on the thecal sac, but at C2-3 there is also a left paracentral disc herniation with indentation on the ventral surface of the left hemicord. Due to uncinate joint spurring, there is degenerative foraminal stenosis which is moderate on the right at C3-4, bilaterally moderate at C4-5, mild on the left at C5-6. Other:  None. CT CHEST FINDINGS Cardiovascular: The cardiac size is normal. There is no pericardial effusion. There is calcification in the proximal LAD coronary artery only, mild aortic calcific plaques and mild aortic tortuosity. There are minimal calcifications in the great vessels. There is no aortic or great vessel aneurysm, stenosis or dissection. There is no arterial or venous dilatation or central arterial embolus. Mediastinum/Nodes: No enlarged mediastinal, hilar, or axillary lymph nodes. Thyroid gland, trachea, and esophagus demonstrate no significant findings. Both main bronchi are clear. Lungs/Pleura: There are mild centrilobular and paraseptal emphysematous changes in the lung apices. There is no pleural effusion or pneumothorax. There is mild posterior atelectasis and scattered linear scar-like opacities in both bases. No pulmonary contusion or infiltrates are seen. There is new demonstration of a pleural-based rounded noncalcified nodule in the posterolateral base of the right lower lobe measuring 1.2 x 0.9 cm on 6:159, with pleurodiaphragmatic stranding. There previously were contusive changes in the area as well as a right lower lobe laceration posteriorly, and it is possible this could be a nodular scar but it is nonspecific. There is also a newly noted 3 mm right upper lobe suprahilar nodule on 6:70. The remaining lungs are free of nodules. The remaining lungs are clear. There is eventration and asymmetric chronic elevation of the anterior right diaphragm. Musculoskeletal: There is chronic healed fracture deformity of the right clavicle, previously  acute. There are healed fracture deformities of some of the right ribs. No acute displaced rib fracture or sternal injury is seen. Visualized portions of the shoulder girdles are intact, as visualized. No spinal compression fracture is evident. CT ABDOMEN PELVIS FINDINGS Hepatobiliary: No hepatic injury or perihepatic hematoma. Gallbladder is unremarkable. There is no hepatic mass enhancement and no biliary dilatation. The liver is 19 cm length with slight steatosis. Pancreas: No abnormality. Spleen: No abnormality. No acute traumatic findings.  No splenomegaly. Adrenals/Urinary Tract: No adrenal hemorrhage or renal injury identified. Bladder is unremarkable. There is no mass enhancement in the adrenal glands and kidneys. Perinephric stranding is again noted symmetrically. There is no urinary stone or obstruction. Stomach/Bowel: No dilatation or wall thickening including of the appendix. Advanced sigmoid diverticulosis. No findings of acute diverticulitis or colitis. Vascular/Lymphatic: Aortic atherosclerosis. No enlarged abdominal or pelvic lymph nodes. Reproductive: No prostatomegaly. Calcification is again seen in the left lobe. Other: There is no free air, free fluid, free hemorrhage or incarcerated hernia. Multiple pelvic phleboliths Musculoskeletal: There are degenerative changes of the lumbar spine, bridging osteophytes across the left SI joint. Chronic trabecular coarsening with interspersed well corticated lucencies, are again seen in the right iliac wing and unchanged consistent with a chronic process. No regional skeletal fracture is evident. There is a sizable left paracentral L4-5 disc herniation partially effacing the left hemicanal and with likely mass effect at least on the descending left L5 nerve root if not also on the exiting left L4 nerve root in the proximal left foramen. This was not seen in 2007. IMPRESSION: 1. No acute intracranial CT findings or depressed skull fractures. 2. Cervical  degenerative changes without evidence of fractures or listhesis. 3. Moderate to severe spinal canal stenosis with cord compression at C3-4, C4-5, and C5-6 due to short pedicles and dorsal disc osteophyte complexes. 4. No acute trauma related findings in the chest, abdomen or pelvis. 5. 1.2 x 0.9 cm rounded pleural-based nodule in the posterolateral base of the right lower lobe with pleurodiaphragmatic stranding. This could be a nodular scar, but is nonspecific. Consider one of the following in 3 months for both low-risk and high-risk individuals: (a) repeat chest CT, (b) follow-up PET-CT, or (c) tissue sampling. This recommendation follows the consensus statement: Guidelines for Management of Incidental Pulmonary Nodules Detected on CT Images: From the Fleischner Society 2017; Radiology 2017; 284:228-243. 6. 3 mm right upper lobe suprahilar nodule, also new. 7. Aortic and coronary artery atherosclerosis. 8. Diverticulosis without evidence of diverticulitis. 9. 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 Acuity indeterminate. 10. Emphysema. Emphysema (ICD10-J43.9). Electronically Signed   By: Almira Bar M.D.   On: 08/19/2022 02:17   DG Shoulder Right Port  Result Date: 08/19/2022 CLINICAL DATA:  Status post fall. EXAM: RIGHT SHOULDER - 1 VIEW COMPARISON:  December 23, 2005 FINDINGS: There is no evidence of an acute fracture or dislocation. A chronic fracture deformity is seen involving the mid to distal right clavicle. There is no evidence of arthropathy or other focal bone abnormality. Soft tissues are unremarkable. IMPRESSION: Chronic fracture deformity of the mid to distal right clavicle. Electronically Signed   By: Aram Candela M.D.   On: 08/19/2022 01:40    Pending Labs Unresulted Labs (From admission, onward)     Start     Ordered   08/19/22 0641  HIV Antibody (routine testing w rflx)   (HIV Antibody (Routine testing w reflex) panel)  Once,   R        08/19/22 0645   08/19/22 0051  Blood culture (routine x 2)  BLOOD CULTURE X 2,   R      08/19/22 0050            Vitals/Pain Today's Vitals   08/19/22 1619 08/19/22 1700 08/19/22 1745 08/19/22 1758  BP:      Pulse:  (!) 59  63   Resp:  12 17   Temp: 99.3 F (37.4 C)     TempSrc: Oral     SpO2:  95% 98%   Weight:    80.3 kg  Height:    6' (1.829 m)  PainSc:        Isolation Precautions No active isolations  Medications Medications  sodium chloride 0.9 % bolus 1,000 mL (0 mLs Intravenous Stopped 08/19/22 0442)    Followed by  0.9 %  sodium chloride infusion ( Intravenous Rate/Dose Verify 08/19/22 1503)  acetaminophen (TYLENOL) tablet 650 mg (650 mg Oral Given 08/19/22 1351)    Or  acetaminophen (TYLENOL) suppository 650 mg ( Rectal See Alternative 08/19/22 1351)  oxyCODONE (Oxy IR/ROXICODONE) immediate release tablet 5 mg (5 mg Oral Given 08/19/22 1351)  HYDROmorphone (DILAUDID) injection 0.5-1 mg (1 mg Intravenous Given 08/19/22 1138)  methocarbamol (ROBAXIN) 500 mg in dextrose 5 % 50 mL IVPB (has no administration in time range)  docusate sodium (COLACE) capsule 100 mg (100 mg Oral Given 08/19/22 0931)  polyethylene glycol (MIRALAX / GLYCOLAX) packet 17 g (has no administration in time range)  ondansetron (ZOFRAN) tablet 4 mg ( Oral See Alternative 08/19/22 1351)    Or  ondansetron (ZOFRAN) injection 4 mg (4 mg Intravenous Given 08/19/22 1351)  dexamethasone (DECADRON) injection 10 mg (10 mg Intravenous Given 08/19/22 1726)  gabapentin (NEURONTIN) capsule 300 mg (300 mg Oral Given 08/19/22 1726)  acetaminophen (OFIRMEV) IV 1,000 mg (0 mg Intravenous Stopped 08/19/22 0240)  iohexol (OMNIPAQUE) 350 MG/ML injection 75 mL (75 mLs Intravenous Contrast Given 08/19/22 0134)  fentaNYL (SUBLIMAZE) injection 50 mcg (50 mcg Intravenous Given 08/19/22 0227)  cefTRIAXone (ROCEPHIN) 1 g in sodium chloride 0.9 % 100 mL IVPB (0 g  Intravenous Stopped 08/19/22 0442)  fentaNYL (SUBLIMAZE) injection 50 mcg (50 mcg Intravenous Given 08/19/22 0528)  fentaNYL (SUBLIMAZE) injection 50 mcg (50 mcg Intravenous Given 08/19/22 0747)    Mobility Walks at baseline       R Recommendations: See Admitting Provider Note  Report given to:   Additional Notes: in ccollar, NS @ 125, has cord compression from fall and left arm weakness, A&Ox4

## 2022-08-19 NOTE — ED Notes (Signed)
[place] J applied at this time, c-spine precautions maintained

## 2022-08-19 NOTE — ED Provider Notes (Signed)
[place] [facility] DE[facility]MENT AT [facility] Provider Note  CSN: [id number] Arrival date & time: 08/19/22 2952  Chief Complaint(s) Level 2 Fall  HPI Kenneth Flores is a 62 y.o. male with a past medical history listed below who presents as a level 2 trauma.  Patient reports passing out while voiding earlier this evening.  He reports being waking up by his wife and noting he was on the floor.  He noted neck pain and inability to move his extremities prompting a call to EMS.  Patient believes he struck the back of the neck and shoulder on a marble countertop on his way down.  Patient is not anticoagulated.  HPI  Past Medical History Past Medical History:  Diagnosis Date   Hepatitis C    Patient Active Problem List   Diagnosis Date Noted   Cervical spinal cord injury (HCC) 08/19/2022   Chronic hepatitis C without hepatic coma (HCC) 09/08/2021   Thrombocytopenia (HCC) 09/08/2021   Home Medication(s) Prior to Admission medications   Not on File                                                                                                                                    Allergies Patient has no known allergies.  Review of Systems Review of Systems As noted in HPI  Physical Exam Vital Signs  I have reviewed the triage vital signs BP (!) 161/96   Pulse 62   Temp 98.3 F (36.8 C) (Oral)   Resp 17   Wt 80.3 kg   SpO2 100%   BMI 24.01 kg/m   Physical Exam Constitutional:      General: He is not in acute distress.    Appearance: He is well-developed. He is not diaphoretic.  HENT:     Head: Normocephalic.     Right Ear: External ear normal.     Left Ear: External ear normal.  Eyes:     General: No scleral icterus.       Right eye: No discharge.        Left eye: No discharge.     Conjunctiva/sclera: Conjunctivae normal.     Pupils: Pupils are equal, round, and reactive to light.  Cardiovascular:     Rate and Rhythm: Regular rhythm.     Pulses:           Radial pulses are 2+ on the right side and 2+ on the left side.       Dorsalis pedis pulses are 2+ on the right side and 2+ on the left side.     Heart sounds: Normal heart sounds. No murmur heard.    No friction rub. No gallop.  Pulmonary:     Effort: Pulmonary effort is normal. No respiratory distress.     Breath sounds: Normal breath sounds. No stridor.  Abdominal:     General: There is no distension.     Palpations:  Abdomen is soft.     Tenderness: There is no abdominal tenderness.  Musculoskeletal:     Right shoulder: Tenderness present.     Cervical back: Normal range of motion and neck supple. No bony tenderness. Spinous process tenderness present.     Thoracic back: No bony tenderness.     Lumbar back: No bony tenderness.     Comments: Clavicle stable. Chest stable to AP/Lat compression. Pelvis stable to Lat compression. No obvious extremity deformity. No chest or abdominal wall contusion.  Skin:    General: Skin is warm.  Neurological:     Mental Status: He is alert and oriented to person, place, and time.     GCS: GCS eye subscore is 4. GCS verbal subscore is 5. GCS motor subscore is 6.     Comments: Gross weakness to BUE mostly distal to elbow. Initial BLE weakness that improved.       ED Results and Treatments Labs (all labs ordered are listed, but only abnormal results are displayed) Labs Reviewed  COMPREHENSIVE METABOLIC PANEL - Abnormal; Notable for the following components:      Result Value   CO2 19 (*)    Anion gap 18 (*)    All other components within normal limits  CBC - Abnormal; Notable for the following components:   WBC 12.2 (*)    Platelets 127 (*)    All other components within normal limits  ETHANOL - Abnormal; Notable for the following components:   Alcohol, Ethyl (B) 26 (*)    All other components within normal limits  LACTIC ACID, PLASMA - Abnormal; Notable for the following components:   Lactic Acid, Venous 2.0 (*)    All other  components within normal limits  URINALYSIS, W/ REFLEX TO CULTURE (INFECTION SUSPECTED) - Abnormal; Notable for the following components:   Specific Gravity, Urine 1.035 (*)    Ketones, ur 5 (*)    All other components within normal limits  I-STAT CHEM 8, ED - Abnormal; Notable for the following components:   Calcium, Ion 1.07 (*)    All other components within normal limits  RESP PANEL BY RT-PCR (RSV, FLU A&B, COVID)  RVPGX2  CULTURE, BLOOD (ROUTINE X 2)  CULTURE, BLOOD (ROUTINE X 2)  PROTIME-INR  CK  LACTIC ACID, PLASMA  HIV ANTIBODY (ROUTINE TESTING W REFLEX)  SAMPLE TO BLOOD BANK  TROPONIN I (HIGH SENSITIVITY)  TROPONIN I (HIGH SENSITIVITY)                                                                                                                         EKG  EKG Interpretation  Date/Time:  Wednesday Aug 19 2022 00:56:07 EDT Ventricular Rate:  82 PR Interval:  202 QRS Duration: 91 QT Interval:  376 QTC Calculation: 440 R Axis:   72 Text Interpretation: Sinus rhythm Borderline prolonged PR interval Probable left atrial enlargement ST elevation suggests acute pericarditis No old tracing to compare Confirmed by Drema Pry [phone]) on  08/19/2022 4:55:56 AM       Radiology MR Cervical Spine Wo Contrast  Result Date: 08/19/2022 CLINICAL DATA:  62 year old male status post fall. Neurologic deficit or paresthesia. EXAM: MRI CERVICAL SPINE WITHOUT CONTRAST TECHNIQUE: Multiplanar, multisequence MR imaging of the cervical spine was performed. No intravenous contrast was administered. COMPARISON:  Cervical spine CT 0117 hours today. Brain MRI [facility] 02/09/2005. FINDINGS: Alignment: Stable with mild reversal of the normal upper cervical lordosis. No significant spondylolisthesis. Vertebrae: No cervical or skull base vertebral marrow edema identified. But there is confluent abnormal prevertebral edema or inflammation from the anterior odontoid through the C5-C6 disc space.  Cord: Abnormal cervical spinal cord signal from the mid C3 vertebral body level through the C4-C5 disc space level. 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 However, a component of acute cord contusion cannot be excluded, and there is questionable faint susceptibility within the left paracentral cord on series 9, image 19 GRE. No cord expansion. No abnormal spinal cord signal above or below those levels, with capacious visible thoracic spinal canal. Posterior Fossa, vertebral arteries, paraspinal tissues: Cervicomedullary junction is within normal limits. Scattered small foci of T2 hyperintensity in the cerebellum appear to be small chronic cerebellar infarcts, increased from the 2006 MRI. Maintained major vascular flow voids in the bilateral neck. Prevertebral edema or fluid from just below the anterior C1 ring through the C6 vertebral body measures up to 6-7 mm in thickness at the C3 and C4 levels. Anterior longitudinal ligament injury is possible. And there is faint interspinous ligament signal abnormality to the right of midline at the C3-C4 level (series 10, image 7). No discrete paraspinal muscle edema identified. Tectorial membrane appears intact. Disc levels: C2-C3: Broad-based posterior disc bulge or protrusion. Mild ligament flavum hypertrophy. Borderline spinal stenosis. No cord mass effect or foraminal stenosis. C3-C4: Disc space loss. Circumferential disc osteophyte complex with broad-based posterior component. Up to moderate spinal stenosis and spinal cord mass effect with associated abnormal cord signal. Mild facet hypertrophy. Moderate left and moderate to severe right C4 foraminal stenosis. C4-C5: Similar disc space loss. Circumferential disc osteophyte complex with broad-based posterior component. Mild spinal stenosis, cord mass effect, and abnormal cord signal as above. Moderate to  severe bilateral C5 neural foraminal stenosis. C5-C6: Disc space loss with left eccentric disc osteophyte complex. Mild spinal stenosis and spinal cord mass effect. Moderate to severe left C6 neural foraminal stenosis. C6-C7:  Minor disc bulging.  No stenosis. C7-T1:  Negative. IMPRESSION: 1. Positive for evidence of both Ligamentous Injury, and Abnormal Cervical Spinal Cord in the setting of trauma: - Prevertebral edema or fluid from C1 through C6. Consider anterior longitudinal ligament injury, although no discrete ligamentous disruption is identified. - mild posterior interspinous ligamentous injury at C3-C4 eccentric to the right. - Abnormal spinal cord signal from C3 to the C5 cord level. While some of this most resembles chronic Myelomalacia (see #2), a component of Acute Cord Contusion cannot be excluded - especially in the left central cord at C3. *Underlying bulky cervical spine degeneration with spinal stenosis AND spinal cord mass effect from C3-C4 (Moderate) through C5-C6 (mild). Associated moderate to severe degenerative neural foraminal stenosis at the bilateral C4, C5, and left C6 nerve levels. 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 Study discussed by telephone with Dr. Drema Pry on 08/19/2022 at 04:37 . Electronically Signed  By: Odessa Fleming M.D.   On: 08/19/2022 04:41   CT ANGIO HEAD NECK W WO CM  Result Date: 08/19/2022 CLINICAL DATA:  Neck trauma, midline tenderness EXAM: CT ANGIOGRAPHY HEAD AND NECK WITH AND WITHOUT CONTRAST TECHNIQUE: Multidetector CT imaging of the head and neck was performed using the standard protocol during bolus administration of intravenous contrast. Multiplanar CT image reconstructions and MIPs were obtained to evaluate the vascular anatomy. Carotid stenosis measurements (when applicable) are obtained utilizing NASCET criteria, using the distal internal carotid diameter as the denominator. RADIATION DOSE REDUCTION: This  exam was performed according to the departmental dose-optimization program which includes automated exposure control, adjustment of the mA and/or kV according to patient size and/or use of iterative reconstruction technique. CONTRAST:  75mL OMNIPAQUE IOHEXOL 350 MG/ML SOLN COMPARISON:  No prior CTA available, correlation is made with CT head 08/19/2022 FINDINGS: CT HEAD FINDINGS For noncontrast findings, please see same day CT head. CTA NECK FINDINGS Aortic arch: Standard branching. Imaged portion shows no evidence of aneurysm or dissection. No significant stenosis of the major arch vessel origins. Right carotid system: No evidence of stenosis, dissection, or occlusion. Left carotid system: No evidence of stenosis, dissection, or occlusion. Vertebral arteries: No evidence of stenosis, dissection, or occlusion. Skeleton: No acute osseous abnormality. Degenerative changes in the cervical spine. Please see same day CT cervical spine for more detailed findings. Other neck: Negative. Upper chest: No focal pulmonary opacity or pleural effusion. Review of the MIP images confirms the above findings CTA HEAD FINDINGS Anterior circulation: Both internal carotid arteries are patent to the termini, without significant stenosis. A1 segments patent. Normal anterior communicating artery. Anterior cerebral arteries are patent to their distal aspects without significant stenosis. No M1 stenosis or occlusion. MCA branches perfused to their distal aspects without significant stenosis. Posterior circulation: Vertebral arteries patent to the vertebrobasilar junction without significant stenosis. Posterior inferior cerebellar arteries patent proximally. Basilar patent to its distal aspect without significant stenosis. Superior cerebellar arteries patent proximally. Patent left P1. Aplastic right P 1. Fetal origin of the right PCA from the right posterior communicating artery. PCAs perfused to their distal aspects without significant  stenosis. The left posterior communicating artery is not visualized. Venous sinuses: As permitted by contrast timing, patent. Anatomic variants: Fetal origin of the right PCA. Review of the MIP images confirms the above findings IMPRESSION: 1. No intracranial large vessel occlusion or significant stenosis. 2. No hemodynamically significant stenosis in the neck. 3. No evidence of vascular injury. Electronically Signed   By: Wiliam Ke M.D.   On: 08/19/2022 02:41   CT HEAD WO CONTRAST  Result Date: 08/19/2022 CLINICAL DATA:  62 year old presents following a fall injury at home, with posterior neck and right shoulder impact. EXAM: CT HEAD WITHOUT CONTRAST CT CERVICAL SPINE WITHOUT CONTRAST CT CHEST, ABDOMEN AND PELVIS WITH CONTRAST TECHNIQUE: Contiguous axial images were obtained from the base of the skull through the vertex without intravenous contrast. Multidetector CT imaging of the cervical spine was performed without intravenous contrast. Multiplanar CT image reconstructions were also generated. Multidetector CT imaging of the chest, abdomen and pelvis was performed following the standard protocol during bolus administration of intravenous contrast. RADIATION DOSE REDUCTION: This exam was performed according to the departmental dose-optimization program which includes automated exposure control, adjustment of the mA and/or kV according to patient size and/or use of iterative reconstruction technique. CONTRAST:  75mL OMNIPAQUE IOHEXOL 350 MG/ML SOLN COMPARISON:  CT scan head, cervical spine, and chest, abdomen and pelvis CT  with contrast all obtained 12/23/2005. Also right upper quadrant ultrasound 09/10/2021. FINDINGS: CT HEAD FINDINGS Brain: No evidence of acute infarction, hemorrhage, hydrocephalus, extra-axial collection or mass lesion/mass effect. Vascular: No hyperdense vessel or unexpected calcification. Skull: No appreciable scalp hematoma. Negative for fractures or focal lesions. Sinuses/Orbits:  Negative orbits. Mild membrane disease in the paranasal sinuses noted without fluid levels. No mastoid effusion. Right deviated nasal septum with right-sided spurring. Other: None. CT CERVICAL FINDINGS Alignment: There is a slight reversal of the usual cervical lordosis. No levels show significant listhesis. Bone-on-bone anterior atlantodental joint space loss is again seen with osteophytes. Skull base and vertebrae: No acute fracture is evident. No primary bone lesion or focal pathologic process. Soft tissues and spinal canal: No prevertebral fluid or swelling. No visible canal hematoma. There are calcifications of the left-greater-than-right carotid bifurcations. No laryngeal mass. Disc levels: There are congenitally short pedicles reducing the effective AP diameter of the thecal sac to as little as 6.5 mm at some levels. The discs are degenerated with bidirectional osteophytes from C3-4 through C5-6, where dorsal disc osteophyte complexes cause moderate to severe spinal canal stenosis with spondylotic cord compression all 3 levels. Other levels do not show as significant soft tissue or bony encroachment on the thecal sac, but at C2-3 there is also a left paracentral disc herniation with indentation on the ventral surface of the left hemicord. Due to uncinate joint spurring, there is degenerative foraminal stenosis which is moderate on the right at C3-4, bilaterally moderate at C4-5, mild on the left at C5-6. Other:  None. CT CHEST FINDINGS Cardiovascular: The cardiac size is normal. There is no pericardial effusion. There is calcification in the proximal LAD coronary artery only, mild aortic calcific plaques and mild aortic tortuosity. There are minimal calcifications in the great vessels. There is no aortic or great vessel aneurysm, stenosis or dissection. There is no arterial or venous dilatation or central arterial embolus. Mediastinum/Nodes: No enlarged mediastinal, hilar, or axillary lymph nodes. Thyroid  gland, trachea, and esophagus demonstrate no significant findings. Both main bronchi are clear. Lungs/Pleura: There are mild centrilobular and paraseptal emphysematous changes in the lung apices. There is no pleural effusion or pneumothorax. There is mild posterior atelectasis and scattered linear scar-like opacities in both bases. No pulmonary contusion or infiltrates are seen. There is new demonstration of a pleural-based rounded noncalcified nodule in the posterolateral base of the right lower lobe measuring 1.2 x 0.9 cm on 6:159, with pleurodiaphragmatic stranding. There previously were contusive changes in the area as well as a right lower lobe laceration posteriorly, and it is possible this could be a nodular scar but it is nonspecific. There is also a newly noted 3 mm right upper lobe suprahilar nodule on 6:70. The remaining lungs are free of nodules. The remaining lungs are clear. There is eventration and asymmetric chronic elevation of the anterior right diaphragm. Musculoskeletal: There is chronic healed fracture deformity of the right clavicle, previously acute. There are healed fracture deformities of some of the right ribs. No acute displaced rib fracture or sternal injury is seen. Visualized portions of the shoulder girdles are intact, as visualized. No spinal compression fracture is evident. CT ABDOMEN PELVIS FINDINGS Hepatobiliary: No hepatic injury or perihepatic hematoma. Gallbladder is unremarkable. There is no hepatic mass enhancement and no biliary dilatation. The liver is 19 cm length with slight steatosis. Pancreas: No abnormality. Spleen: No abnormality. No acute traumatic findings. No splenomegaly. Adrenals/Urinary Tract: No adrenal hemorrhage or renal injury identified. Bladder is  unremarkable. There is no mass enhancement in the adrenal glands and kidneys. Perinephric stranding is again noted symmetrically. There is no urinary stone or obstruction. Stomach/Bowel: No dilatation or wall  thickening including of the appendix. Advanced sigmoid diverticulosis. No findings of acute diverticulitis or colitis. Vascular/Lymphatic: Aortic atherosclerosis. No enlarged abdominal or pelvic lymph nodes. Reproductive: No prostatomegaly. Calcification is again seen in the left lobe. Other: There is no free air, free fluid, free hemorrhage or incarcerated hernia. Multiple pelvic phleboliths Musculoskeletal: There are degenerative changes of the lumbar spine, bridging osteophytes across the left SI joint. Chronic trabecular coarsening with interspersed well corticated lucencies, are again seen in the right iliac wing and unchanged consistent with a chronic process. No regional skeletal fracture is evident. There is a sizable left paracentral L4-5 disc herniation partially effacing the left hemicanal and with likely mass effect at least on the descending left L5 nerve root if not also on the exiting left L4 nerve root in the proximal left foramen. This was not seen in 2007. IMPRESSION: 1. No acute intracranial CT findings or depressed skull fractures. 2. Cervical degenerative changes without evidence of fractures or listhesis. 3. Moderate to severe spinal canal stenosis with cord compression at C3-4, C4-5, and C5-6 due to short pedicles and dorsal disc osteophyte complexes. 4. No acute trauma related findings in the chest, abdomen or pelvis. 5. 1.2 x 0.9 cm rounded pleural-based nodule in the posterolateral base of the right lower lobe with pleurodiaphragmatic stranding. This could be a nodular scar, but is nonspecific. Consider one of the following in 3 months for both low-risk and high-risk individuals: (a) repeat chest CT, (b) follow-up PET-CT, or (c) tissue sampling. This recommendation follows the consensus statement: Guidelines for Management of Incidental Pulmonary Nodules Detected on CT Images: From the Fleischner Society 2017; Radiology 2017; 284:228-243. 6. 3 mm right upper lobe suprahilar nodule, also  new. 7. Aortic and coronary artery atherosclerosis. 8. Diverticulosis without evidence of diverticulitis. 9. 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 Acuity indeterminate. 10. Emphysema. Emphysema (ICD10-J43.9). Electronically Signed   By: Almira Bar M.D.   On: 08/19/2022 02:17   CT CHEST ABDOMEN PELVIS W CONTRAST  Result Date: 08/19/2022 CLINICAL DATA:  62 year old presents following a fall injury at home, with posterior neck and right shoulder impact. EXAM: CT HEAD WITHOUT CONTRAST CT CERVICAL SPINE WITHOUT CONTRAST CT CHEST, ABDOMEN AND PELVIS WITH CONTRAST TECHNIQUE: Contiguous axial images were obtained from the base of the skull through the vertex without intravenous contrast. Multidetector CT imaging of the cervical spine was performed without intravenous contrast. Multiplanar CT image reconstructions were also generated. Multidetector CT imaging of the chest, abdomen and pelvis was performed following the standard protocol during bolus administration of intravenous contrast. RADIATION DOSE REDUCTION: This exam was performed according to the departmental dose-optimization program which includes automated exposure control, adjustment of the mA and/or kV according to patient size and/or use of iterative reconstruction technique. CONTRAST:  75mL OMNIPAQUE IOHEXOL 350 MG/ML SOLN COMPARISON:  CT scan head, cervical spine, and chest, abdomen and pelvis CT with contrast all obtained 12/23/2005. Also right upper quadrant ultrasound 09/10/2021. FINDINGS: CT HEAD FINDINGS Brain: No evidence of acute infarction, hemorrhage, hydrocephalus, extra-axial collection or mass lesion/mass effect. Vascular: No hyperdense vessel or unexpected calcification. Skull: No appreciable scalp hematoma. Negative for fractures or focal lesions. Sinuses/Orbits: Negative orbits. Mild membrane disease in the paranasal  sinuses noted without fluid levels. No mastoid effusion. Right deviated nasal septum with right-sided spurring. Other: None. CT CERVICAL FINDINGS Alignment: There is a slight reversal of the usual cervical lordosis. No levels show significant listhesis. Bone-on-bone anterior atlantodental joint space loss is again seen with osteophytes. Skull base and vertebrae: No acute fracture is evident. No primary bone lesion or focal pathologic process. Soft tissues and spinal canal: No prevertebral fluid or swelling. No visible canal hematoma. There are calcifications of the left-greater-than-right carotid bifurcations. No laryngeal mass. Disc levels: There are congenitally short pedicles reducing the effective AP diameter of the thecal sac to as little as 6.5 mm at some levels. The discs are degenerated with bidirectional osteophytes from C3-4 through C5-6, where dorsal disc osteophyte complexes cause moderate to severe spinal canal stenosis with spondylotic cord compression all 3 levels. Other levels do not show as significant soft tissue or bony encroachment on the thecal sac, but at C2-3 there is also a left paracentral disc herniation with indentation on the ventral surface of the left hemicord. Due to uncinate joint spurring, there is degenerative foraminal stenosis which is moderate on the right at C3-4, bilaterally moderate at C4-5, mild on the left at C5-6. Other:  None. CT CHEST FINDINGS Cardiovascular: The cardiac size is normal. There is no pericardial effusion. There is calcification in the proximal LAD coronary artery only, mild aortic calcific plaques and mild aortic tortuosity. There are minimal calcifications in the great vessels. There is no aortic or great vessel aneurysm, stenosis or dissection. There is no arterial or venous dilatation or central arterial embolus. Mediastinum/Nodes: No enlarged mediastinal, hilar, or axillary lymph nodes. Thyroid gland, trachea, and esophagus demonstrate no significant  findings. Both main bronchi are clear. Lungs/Pleura: There are mild centrilobular and paraseptal emphysematous changes in the lung apices. There is no pleural effusion or pneumothorax. There is mild posterior atelectasis and scattered linear scar-like opacities in both bases. No pulmonary contusion or infiltrates are seen. There is new demonstration of a pleural-based rounded noncalcified nodule in the posterolateral base of the right lower lobe measuring 1.2 x 0.9 cm on 6:159, with pleurodiaphragmatic stranding. There previously were contusive changes in the area as well as a right lower lobe laceration posteriorly, and it is possible this could be a nodular scar but it is nonspecific. There is also a newly noted 3 mm right upper lobe suprahilar nodule on 6:70. The remaining lungs are free of nodules. The remaining lungs are clear. There is eventration and asymmetric chronic elevation of the anterior right diaphragm. Musculoskeletal: There is chronic healed fracture deformity of the right clavicle, previously acute. There are healed fracture deformities of some of the right ribs. No acute displaced rib fracture or sternal injury is seen. Visualized portions of the shoulder girdles are intact, as visualized. No spinal compression fracture is evident. CT ABDOMEN PELVIS FINDINGS Hepatobiliary: No hepatic injury or perihepatic hematoma. Gallbladder is unremarkable. There is no hepatic mass enhancement and no biliary dilatation. The liver is 19 cm length with slight steatosis. Pancreas: No abnormality. Spleen: No abnormality. No acute traumatic findings. No splenomegaly. Adrenals/Urinary Tract: No adrenal hemorrhage or renal injury identified. Bladder is unremarkable. There is no mass enhancement in the adrenal glands and kidneys. Perinephric stranding is again noted symmetrically. There is no urinary stone or obstruction. Stomach/Bowel: No dilatation or wall thickening including of the appendix. Advanced sigmoid  diverticulosis. No findings of acute diverticulitis or colitis. Vascular/Lymphatic: Aortic atherosclerosis. No enlarged abdominal or pelvic lymph nodes. Reproductive:  No prostatomegaly. Calcification is again seen in the left lobe. Other: There is no free air, free fluid, free hemorrhage or incarcerated hernia. Multiple pelvic phleboliths Musculoskeletal: There are degenerative changes of the lumbar spine, bridging osteophytes across the left SI joint. Chronic trabecular coarsening with interspersed well corticated lucencies, are again seen in the right iliac wing and unchanged consistent with a chronic process. No regional skeletal fracture is evident. There is a sizable left paracentral L4-5 disc herniation partially effacing the left hemicanal and with likely mass effect at least on the descending left L5 nerve root if not also on the exiting left L4 nerve root in the proximal left foramen. This was not seen in 2007. IMPRESSION: 1. No acute intracranial CT findings or depressed skull fractures. 2. Cervical degenerative changes without evidence of fractures or listhesis. 3. Moderate to severe spinal canal stenosis with cord compression at C3-4, C4-5, and C5-6 due to short pedicles and dorsal disc osteophyte complexes. 4. No acute trauma related findings in the chest, abdomen or pelvis. 5. 1.2 x 0.9 cm rounded pleural-based nodule in the posterolateral base of the right lower lobe with pleurodiaphragmatic stranding. This could be a nodular scar, but is nonspecific. Consider one of the following in 3 months for both low-risk and high-risk individuals: (a) repeat chest CT, (b) follow-up PET-CT, or (c) tissue sampling. This recommendation follows the consensus statement: Guidelines for Management of Incidental Pulmonary Nodules Detected on CT Images: From the Fleischner Society 2017; Radiology 2017; 284:228-243. 6. 3 mm right upper lobe suprahilar nodule, also new. 7. Aortic and coronary artery atherosclerosis. 8.  Diverticulosis without evidence of diverticulitis. 9. 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 Acuity indeterminate. 10. Emphysema. Emphysema (ICD10-J43.9). Electronically Signed   By: Almira Bar M.D.   On: 08/19/2022 02:17   CT C-SPINE NO CHARGE  Result Date: 08/19/2022 CLINICAL DATA:  62 year old presents following a fall injury at home, with posterior neck and right shoulder impact. EXAM: CT HEAD WITHOUT CONTRAST CT CERVICAL SPINE WITHOUT CONTRAST CT CHEST, ABDOMEN AND PELVIS WITH CONTRAST TECHNIQUE: Contiguous axial images were obtained from the base of the skull through the vertex without intravenous contrast. Multidetector CT imaging of the cervical spine was performed without intravenous contrast. Multiplanar CT image reconstructions were also generated. Multidetector CT imaging of the chest, abdomen and pelvis was performed following the standard protocol during bolus administration of intravenous contrast. RADIATION DOSE REDUCTION: This exam was performed according to the departmental dose-optimization program which includes automated exposure control, adjustment of the mA and/or kV according to patient size and/or use of iterative reconstruction technique. CONTRAST:  75mL OMNIPAQUE IOHEXOL 350 MG/ML SOLN COMPARISON:  CT scan head, cervical spine, and chest, abdomen and pelvis CT with contrast all obtained 12/23/2005. Also right upper quadrant ultrasound 09/10/2021. FINDINGS: CT HEAD FINDINGS Brain: No evidence of acute infarction, hemorrhage, hydrocephalus, extra-axial collection or mass lesion/mass effect. Vascular: No hyperdense vessel or unexpected calcification. Skull: No appreciable scalp hematoma. Negative for fractures or focal lesions. Sinuses/Orbits: Negative orbits. Mild membrane disease in the paranasal sinuses noted without fluid levels. No mastoid effusion. Right deviated  nasal septum with right-sided spurring. Other: None. CT CERVICAL FINDINGS Alignment: There is a slight reversal of the usual cervical lordosis. No levels show significant listhesis. Bone-on-bone anterior atlantodental joint space loss is again seen with osteophytes. Skull base and vertebrae: No acute fracture is evident. No primary bone  lesion or focal pathologic process. Soft tissues and spinal canal: No prevertebral fluid or swelling. No visible canal hematoma. There are calcifications of the left-greater-than-right carotid bifurcations. No laryngeal mass. Disc levels: There are congenitally short pedicles reducing the effective AP diameter of the thecal sac to as little as 6.5 mm at some levels. The discs are degenerated with bidirectional osteophytes from C3-4 through C5-6, where dorsal disc osteophyte complexes cause moderate to severe spinal canal stenosis with spondylotic cord compression all 3 levels. Other levels do not show as significant soft tissue or bony encroachment on the thecal sac, but at C2-3 there is also a left paracentral disc herniation with indentation on the ventral surface of the left hemicord. Due to uncinate joint spurring, there is degenerative foraminal stenosis which is moderate on the right at C3-4, bilaterally moderate at C4-5, mild on the left at C5-6. Other:  None. CT CHEST FINDINGS Cardiovascular: The cardiac size is normal. There is no pericardial effusion. There is calcification in the proximal LAD coronary artery only, mild aortic calcific plaques and mild aortic tortuosity. There are minimal calcifications in the great vessels. There is no aortic or great vessel aneurysm, stenosis or dissection. There is no arterial or venous dilatation or central arterial embolus. Mediastinum/Nodes: No enlarged mediastinal, hilar, or axillary lymph nodes. Thyroid gland, trachea, and esophagus demonstrate no significant findings. Both main bronchi are clear. Lungs/Pleura: There are mild  centrilobular and paraseptal emphysematous changes in the lung apices. There is no pleural effusion or pneumothorax. There is mild posterior atelectasis and scattered linear scar-like opacities in both bases. No pulmonary contusion or infiltrates are seen. There is new demonstration of a pleural-based rounded noncalcified nodule in the posterolateral base of the right lower lobe measuring 1.2 x 0.9 cm on 6:159, with pleurodiaphragmatic stranding. There previously were contusive changes in the area as well as a right lower lobe laceration posteriorly, and it is possible this could be a nodular scar but it is nonspecific. There is also a newly noted 3 mm right upper lobe suprahilar nodule on 6:70. The remaining lungs are free of nodules. The remaining lungs are clear. There is eventration and asymmetric chronic elevation of the anterior right diaphragm. Musculoskeletal: There is chronic healed fracture deformity of the right clavicle, previously acute. There are healed fracture deformities of some of the right ribs. No acute displaced rib fracture or sternal injury is seen. Visualized portions of the shoulder girdles are intact, as visualized. No spinal compression fracture is evident. CT ABDOMEN PELVIS FINDINGS Hepatobiliary: No hepatic injury or perihepatic hematoma. Gallbladder is unremarkable. There is no hepatic mass enhancement and no biliary dilatation. The liver is 19 cm length with slight steatosis. Pancreas: No abnormality. Spleen: No abnormality. No acute traumatic findings. No splenomegaly. Adrenals/Urinary Tract: No adrenal hemorrhage or renal injury identified. Bladder is unremarkable. There is no mass enhancement in the adrenal glands and kidneys. Perinephric stranding is again noted symmetrically. There is no urinary stone or obstruction. Stomach/Bowel: No dilatation or wall thickening including of the appendix. Advanced sigmoid diverticulosis. No findings of acute diverticulitis or colitis.  Vascular/Lymphatic: Aortic atherosclerosis. No enlarged abdominal or pelvic lymph nodes. Reproductive: No prostatomegaly. Calcification is again seen in the left lobe. Other: There is no free air, free fluid, free hemorrhage or incarcerated hernia. Multiple pelvic phleboliths Musculoskeletal: There are degenerative changes of the lumbar spine, bridging osteophytes across the left SI joint. Chronic trabecular coarsening with interspersed well corticated lucencies, are again seen in the right iliac wing and  unchanged consistent with a chronic process. No regional skeletal fracture is evident. There is a sizable left paracentral L4-5 disc herniation partially effacing the left hemicanal and with likely mass effect at least on the descending left L5 nerve root if not also on the exiting left L4 nerve root in the proximal left foramen. This was not seen in 2007. IMPRESSION: 1. No acute intracranial CT findings or depressed skull fractures. 2. Cervical degenerative changes without evidence of fractures or listhesis. 3. Moderate to severe spinal canal stenosis with cord compression at C3-4, C4-5, and C5-6 due to short pedicles and dorsal disc osteophyte complexes. 4. No acute trauma related findings in the chest, abdomen or pelvis. 5. 1.2 x 0.9 cm rounded pleural-based nodule in the posterolateral base of the right lower lobe with pleurodiaphragmatic stranding. This could be a nodular scar, but is nonspecific. Consider one of the following in 3 months for both low-risk and high-risk individuals: (a) repeat chest CT, (b) follow-up PET-CT, or (c) tissue sampling. This recommendation follows the consensus statement: Guidelines for Management of Incidental Pulmonary Nodules Detected on CT Images: From the Fleischner Society 2017; Radiology 2017; 284:228-243. 6. 3 mm right upper lobe suprahilar nodule, also new. 7. Aortic and coronary artery atherosclerosis. 8. Diverticulosis without evidence of diverticulitis. 9. 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 Acuity indeterminate. 10. Emphysema. Emphysema (ICD10-J43.9). Electronically Signed   By: Almira Bar M.D.   On: 08/19/2022 02:17   DG Shoulder Right Port  Result Date: 08/19/2022 CLINICAL DATA:  Status post fall. EXAM: RIGHT SHOULDER - 1 VIEW COMPARISON:  December 23, 2005 FINDINGS: There is no evidence of an acute fracture or dislocation. A chronic fracture deformity is seen involving the mid to distal right clavicle. There is no evidence of arthropathy or other focal bone abnormality. Soft tissues are unremarkable. IMPRESSION: Chronic fracture deformity of the mid to distal right clavicle. Electronically Signed   By: Aram Candela M.D.   On: 08/19/2022 01:40    Medications Ordered in ED Medications  sodium chloride 0.9 % bolus 1,000 mL (0 mLs Intravenous Stopped 08/19/22 0442)    Followed by  0.9 %  sodium chloride infusion (1,000 mLs Intravenous New Bag/Given 08/19/22 0443)  acetaminophen (TYLENOL) tablet 650 mg (has no administration in time range)    Or  acetaminophen (TYLENOL) suppository 650 mg (has no administration in time range)  oxyCODONE (Oxy IR/ROXICODONE) immediate release tablet 5 mg (has no administration in time range)  HYDROmorphone (DILAUDID) injection 0.5-1 mg (has no administration in time range)  methocarbamol (ROBAXIN) 500 mg in dextrose 5 % 50 mL IVPB (has no administration in time range)  docusate sodium (COLACE) capsule 100 mg (has no administration in time range)  polyethylene glycol (MIRALAX / GLYCOLAX) packet 17 g (has no administration in time range)  ondansetron (ZOFRAN) tablet 4 mg (has no administration in time range)    Or  ondansetron (ZOFRAN) injection 4 mg (has no administration in time range)  dexamethasone (DECADRON) injection 10 mg (10 mg Intravenous Given 08/19/22 0748)  gabapentin (NEURONTIN) capsule  300 mg (has no administration in time range)  acetaminophen (OFIRMEV) IV 1,000 mg (0 mg Intravenous Stopped 08/19/22 0240)  iohexol (OMNIPAQUE) 350 MG/ML injection 75 mL (75 mLs Intravenous Contrast Given 08/19/22 0134)  fentaNYL (SUBLIMAZE) injection 50 mcg (50 mcg Intravenous Given 08/19/22 0227)  cefTRIAXone (ROCEPHIN) 1 g in sodium chloride  0.9 % 100 mL IVPB (0 g Intravenous Stopped 08/19/22 0442)  fentaNYL (SUBLIMAZE) injection 50 mcg (50 mcg Intravenous Given 08/19/22 0528)  fentaNYL (SUBLIMAZE) injection 50 mcg (50 mcg Intravenous Given 08/19/22 0747)   Procedures .Critical Care  Performed by: Nira Conn, MD Authorized by: Nira Conn, MD   Critical care provider statement:    Critical care time (minutes):  70   Critical care time was exclusive of:  Separately billable procedures and treating other patients   Critical care was necessary to treat or prevent imminent or life-threatening deterioration of the following conditions:  Trauma and CNS failure or compromise   Critical care was time spent personally by me on the following activities:  Development of treatment plan with patient or surrogate, discussions with consultants, evaluation of patient's response to treatment, examination of patient, obtaining history from patient or surrogate, review of old charts, re-evaluation of patient's condition, pulse oximetry, ordering and review of radiographic studies, ordering and review of laboratory studies and ordering and performing treatments and interventions   Care discussed with: admitting provider     (including critical care time) Medical Decision Making / ED Course  Click here for ABCD2, HEART and other calculators  Medical Decision Making Amount and/or Complexity of Data Reviewed Labs: ordered. Radiology: ordered.  Risk Prescription drug management. Decision regarding hospitalization.  The patient presents to the ED for the following: Syncope Neck  trauma  Key initial findings: Febrile Bilateral upper and lower extremity weakness    This involves an extensive number of treatment options, and is a complaint that carries with it a high risk of complications and morbidity. The differential diagnosis includes but not limited to:  Fever No prior reported history of infectious symptoms other than chronic cough.  Will need to rule out bacterial infection/sepsis.  Possible viral etiology.  Possible temperature D regulation from central nervous system trauma. Neck trauma Exam is concerning for cervical injury more specifically central cord syndrome.  Hospitalization considered:  Yes  Initial intervention:  IV Tylenol, IV fluids and pain medicine  Work up Interpretation and Management:  Cardiac Monitoring/EKG: EKG without acute ischemic changes, dysrhythmias or blocks.  Laboratory Tests ordered listed below with my independent interpretation: CBC with leukocytosis.  No anemia Metabolic panel without significant electrolyte derangement or renal sufficiency Viral panel negative for COVID/influenza/RSV UA without evidence of infection  Imaging Studies ordered listed below with my independent interpretation: CT scans negative for any acute fractures/dislocation.  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 No infectious source. MRI notable for anterior and posterior ligamentous injury with evidence of acute central cord contusion  ED Course:   Clinical Course as of 08/19/22 0822  Wed Aug 19, 2022  0219 Moving his lower extremities now. Right hand grip improving. Left hand grip unchanged. Sensation intact, but feels tingling in LUE [PC]  0435 MRI concerning for cervical cord contusion. [PC]  [id number] Radiology confirmed likely contusion with evidence of ligamentous injury.  Consulting NSU  [PC]  0511 UA without evidence of infection.  No other source of infection noted on workup.  Possible viral versus  temperature regulation related to central nervous system injury. [PC]  0520 I spoke with Dr. Jordan Likes from neurosurgery who will evaluate the patient in the emergency department for likely admission and surgical intervention. [PC]    Clinical Course User Index [PC] Josilyn Shippee, Amadeo Garnet, MD    Final Clinical Impression(s) / ED Diagnoses Final diagnoses:  Central cord syndrome, initial encounter (HCC)  This chart was dictated using voice recognition software.  Despite best efforts to proofread,  errors can occur which can change the documentation meaning.    Nira Conn, MD 08/19/22 [phone]

## 2022-08-19 NOTE — ED Notes (Signed)
Patient transported to CT by Autumn, TRN 

## 2022-08-19 NOTE — Progress Notes (Signed)
Patient arrived to room 5W28 from ED.  Assessment complete, VS obtained, and Admission database began.  

## 2022-08-19 NOTE — Progress Notes (Signed)
   08/19/22 0045  Spiritual Encounters  Type of Visit Initial  Care provided to: Pt not available  Referral source Trauma page  Reason for visit Trauma  OnCall Visit Yes   Chaplain responded to a level two trauma. The patient, Kenneth Flores, was attended to by the medical team. No family is present,  If a chaplain is requested someone will respond.   Valerie Roys [facility]  [phone]

## 2022-08-19 NOTE — H&P (Signed)
Kenneth Flores is an 62 y.o. male.   Chief Complaint: Weakness HPI: 62 year old male suffered a syncopal episode while voiding today.  Patient fell and struck his head.  Patient awake and immediately after the fall.  Patient noted neck pain with burning pain and weakness into both upper and lower extremities.  The patient has slowly regained some strength but continues to have dysesthesias into both shoulders and difficulty with movement of both arms and legs.  He has been able to void.  He has 1 other episode of syncope also related to urination.  He has no known cardiac disease or other major medical problems.  Past Medical History:  Diagnosis Date   Hepatitis C     Past Surgical History:  Procedure Laterality Date   NO PAST SURGERIES      Family History  Problem Relation Age of Onset   Hepatitis C Brother    Social History:  reports that he has been smoking cigars. He has never used smokeless tobacco. He reports that he does not currently use alcohol. He reports that he does not currently use drugs after having used the following drugs: Cocaine and Marijuana.  Allergies: No Known Allergies  (Not in a hospital admission)   Results for orders placed or performed during the hospital encounter of 08/19/22 (from the past 48 hour(s))  I-Stat Chem 8, ED     Status: Abnormal   Collection Time: 08/19/22 12:55 AM  Result Value Ref Range   Sodium 138 135 - 145 mmol/L   Potassium 4.0 3.5 - 5.1 mmol/L   Chloride 105 98 - 111 mmol/L   BUN 15 6 - 20 mg/dL   Creatinine, Ser 0.98 0.61 - 1.24 mg/dL   Glucose, Bld 90 70 - 99 mg/dL    Comment: Glucose reference range applies only to samples taken after fasting for at least 8 hours.   Calcium, Ion 1.07 (L) 1.15 - 1.40 mmol/L   TCO2 22 22 - 32 mmol/L   Hemoglobin 16.7 13.0 - 17.0 g/dL   HCT 11.9 14.7 - 82.9 %  Comprehensive metabolic panel     Status: Abnormal   Collection Time: 08/19/22 12:56 AM  Result Value Ref Range   Sodium 138 135 - 145  mmol/L   Potassium 4.2 3.5 - 5.1 mmol/L   Chloride 101 98 - 111 mmol/L   CO2 19 (L) 22 - 32 mmol/L   Glucose, Bld 88 70 - 99 mg/dL    Comment: Glucose reference range applies only to samples taken after fasting for at least 8 hours.   BUN 14 6 - 20 mg/dL   Creatinine, Ser 5.62 0.61 - 1.24 mg/dL   Calcium 9.3 8.9 - 13.0 mg/dL   Total Protein 7.5 6.5 - 8.1 g/dL   Albumin 3.9 3.5 - 5.0 g/dL   AST 26 15 - 41 U/L   ALT 14 0 - 44 U/L   Alkaline Phosphatase 65 38 - 126 U/L   Total Bilirubin 0.6 0.3 - 1.2 mg/dL   GFR, Estimated >86 >57 mL/min    Comment: (NOTE) Calculated using the CKD-EPI Creatinine Equation (2021)    Anion gap 18 (H) 5 - 15    Comment: Performed at [facility], [address] [place], [place] [address]  CBC     Status: Abnormal   Collection Time: 08/19/22 12:56 AM  Result Value Ref Range   WBC 12.2 (H) 4.0 - 10.5 K/uL   RBC 4.85 4.22 - 5.81 MIL/uL  Hemoglobin 16.4 13.0 - 17.0 g/dL   HCT 74.2 59.5 - 63.8 %   MCV 95.9 80.0 - 100.0 fL   MCH 33.8 26.0 - 34.0 pg   MCHC 35.3 30.0 - 36.0 g/dL   RDW 75.6 43.3 - 29.5 %   Platelets 127 (L) 150 - 400 K/uL   nRBC 0.0 0.0 - 0.2 %    Comment: Performed at [facility], [address], [place], [place] [address]  Ethanol     Status: Abnormal   Collection Time: 08/19/22 12:56 AM  Result Value Ref Range   Alcohol, Ethyl (B) 26 (H) <10 mg/dL    Comment: (NOTE) Lowest detectable limit for serum alcohol is 10 mg/dL.  For medical purposes only. Performed at [facility], [address] [place], [place] [address]   Protime-INR     Status: None   Collection Time: 08/19/22 12:56 AM  Result Value Ref Range   Prothrombin Time 13.6 11.4 - 15.2 seconds   INR 1.0 0.8 - 1.2    Comment: (NOTE) INR goal varies based on device and disease states. Performed at [facility], [address], [place], [place] [address]   Sample to Blood Bank     Status: None   Collection Time: 08/19/22 12:56 AM  Result  Value Ref Range   Blood Bank Specimen SAMPLE AVAILABLE FOR TESTING    Sample Expiration      08/22/2022,2359 Performed at [facility], [address], [place], [place] [address]   CK     Status: None   Collection Time: 08/19/22 12:56 AM  Result Value Ref Range   Total CK 130 49 - 397 U/L    Comment: Performed at [facility], [address], [place], [place] [address]  Troponin I (High Sensitivity)     Status: None   Collection Time: 08/19/22 12:56 AM  Result Value Ref Range   Troponin I (High Sensitivity) 4 <18 ng/L    Comment: (NOTE) Elevated high sensitivity troponin I (hsTnI) values and significant  changes across serial measurements may suggest ACS but many other  chronic and acute conditions are known to elevate hsTnI results.  Refer to the "Links" section for chest pain algorithms and additional  guidance. Performed at [facility], [address], [place], [place] [address]   Lactic acid, plasma     Status: Abnormal   Collection Time: 08/19/22 12:59 AM  Result Value Ref Range   Lactic Acid, Venous 2.0 (HH) 0.5 - 1.9 mmol/L    Comment: CRITICAL RESULT CALLED TO, READ BACK BY AND VERIFIED WITH TYLER WALSTON RN 08/19/22 @0203  BY J. WHITE Performed at [facility], [address], [place], [place] [address]   Resp panel by RT-PCR (RSV, Flu A&B, Covid) Anterior Nasal Swab     Status: None   Collection Time: 08/19/22 12:59 AM   Specimen: Anterior Nasal Swab  Result Value Ref Range   SARS Coronavirus 2 by RT PCR NEGATIVE NEGATIVE   Influenza A by PCR NEGATIVE NEGATIVE   Influenza B by PCR NEGATIVE NEGATIVE    Comment: (NOTE) The Xpert Xpress SARS-CoV-2/FLU/RSV plus assay is intended as an aid in the diagnosis of influenza from Nasopharyngeal swab specimens and should not be used as a sole basis for treatment. Nasal washings and aspirates are unacceptable for Xpert Xpress SARS-CoV-2/FLU/RSV testing.  Fact Sheet for  Patients: BloggerCourse.com  Fact Sheet for Healthcare Providers: SeriousBroker.it  This  test is not yet approved or cleared by the [place] and has been authorized for detection and/or diagnosis of SARS-CoV-2 by FDA under an Emergency Use Authorization (EUA). This EUA will remain in effect (meaning this test can be used) for the duration of the COVID-19 declaration under Section 564(b)(1) of the Act, 21 U.S.C. section 360bbb-3(b)(1), unless the authorization is terminated or revoked.     Resp Syncytial Virus by PCR NEGATIVE NEGATIVE    Comment: (NOTE) Fact Sheet for Patients: BloggerCourse.com  Fact Sheet for Healthcare Providers: SeriousBroker.it  This test is not yet approved or cleared by the [place] FDA and has been authorized for detection and/or diagnosis of SARS-CoV-2 by FDA under an Emergency Use Authorization (EUA). This EUA will remain in effect (meaning this test can be used) for the duration of the COVID-19 declaration under Section 564(b)(1) of the Act, 21 U.S.C. section 360bbb-3(b)(1), unless the authorization is terminated or revoked.  Performed at [facility], [address], [place], [place] [address]   Lactic acid, plasma     Status: None   Collection Time: 08/19/22  2:29 AM  Result Value Ref Range   Lactic Acid, Venous 1.0 0.5 - 1.9 mmol/L    Comment: Performed at [facility], [address] [place], [place] [address]  Troponin I (High Sensitivity)     Status: None   Collection Time: 08/19/22  2:29 AM  Result Value Ref Range   Troponin I (High Sensitivity) 4 <18 ng/L    Comment: (NOTE) Elevated high sensitivity troponin I (hsTnI) values and significant  changes across serial measurements may suggest ACS but many other  chronic and acute conditions are known to elevate hsTnI results.  Refer to the "Links" section for chest  pain algorithms and additional  guidance. Performed at [facility], [address] [place], [place] [address]   Urinalysis, w/ Reflex to Culture (Infection Suspected) -Urine, Clean Catch     Status: Abnormal   Collection Time: 08/19/22  4:45 AM  Result Value Ref Range   Specimen Source URINE, CLEAN CATCH    Color, Urine YELLOW YELLOW   APPearance CLEAR CLEAR   Specific Gravity, Urine 1.035 (H) 1.005 - 1.030   pH 6.0 5.0 - 8.0   Glucose, UA NEGATIVE NEGATIVE mg/dL   Hgb urine dipstick NEGATIVE NEGATIVE   Bilirubin Urine NEGATIVE NEGATIVE   Ketones, ur 5 (A) NEGATIVE mg/dL   Protein, ur NEGATIVE NEGATIVE mg/dL   Nitrite NEGATIVE NEGATIVE   Leukocytes,Ua NEGATIVE NEGATIVE   RBC / HPF 0-5 0 - 5 RBC/hpf   WBC, UA 0-5 0 - 5 WBC/hpf    Comment:        Reflex urine culture not performed if WBC <=10, OR if Squamous epithelial cells >5. If Squamous epithelial cells >5 suggest recollection.    Bacteria, UA NONE SEEN NONE SEEN   Squamous Epithelial / HPF 0-5 0 - 5 /HPF    Comment: Performed at [facility], [address], [place], [place] [address]   MR Cervical Spine Wo Contrast  Result Date: 08/19/2022 CLINICAL DATA:  62 year old male status post fall. Neurologic deficit or paresthesia. EXAM: MRI CERVICAL SPINE WITHOUT CONTRAST TECHNIQUE: Multiplanar, multisequence MR imaging of the cervical spine was performed. No intravenous contrast was administered. COMPARISON:  Cervical spine CT 0117 hours today. Brain MRI [facility] 02/09/2005. FINDINGS: Alignment: Stable with mild reversal of the normal upper cervical lordosis. No significant spondylolisthesis. Vertebrae: No cervical  or skull base vertebral marrow edema identified. But there is confluent abnormal prevertebral edema or inflammation from the anterior odontoid through the C5-C6 disc space. Cord: Abnormal cervical spinal cord signal from the mid C3 vertebral body level through the C4-C5 disc space level. 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 However, a component of acute cord contusion cannot be excluded, and there is questionable faint susceptibility within the left paracentral cord on series 9, image 19 GRE. No cord expansion. No abnormal spinal cord signal above or below those levels, with capacious visible thoracic spinal canal. Posterior Fossa, vertebral arteries, paraspinal tissues: Cervicomedullary junction is within normal limits. Scattered small foci of T2 hyperintensity in the cerebellum appear to be small chronic cerebellar infarcts, increased from the 2006 MRI. Maintained major vascular flow voids in the bilateral neck. Prevertebral edema or fluid from just below the anterior C1 ring through the C6 vertebral body measures up to 6-7 mm in thickness at the C3 and C4 levels. Anterior longitudinal ligament injury is possible. And there is faint interspinous ligament signal abnormality to the right of midline at the C3-C4 level (series 10, image 7). No discrete paraspinal muscle edema identified. Tectorial membrane appears intact. Disc levels: C2-C3: Broad-based posterior disc bulge or protrusion. Mild ligament flavum hypertrophy. Borderline spinal stenosis. No cord mass effect or foraminal stenosis. C3-C4: Disc space loss. Circumferential disc osteophyte complex with broad-based posterior component. Up to moderate spinal stenosis and spinal cord mass effect with associated abnormal cord signal. Mild facet hypertrophy. Moderate left and moderate to severe right C4 foraminal stenosis. C4-C5: Similar disc space loss. Circumferential disc osteophyte complex with broad-based posterior component. Mild spinal stenosis, cord mass effect, and abnormal cord signal as above. Moderate to severe bilateral C5 neural foraminal stenosis. C5-C6: Disc space loss with left eccentric disc osteophyte complex. Mild spinal  stenosis and spinal cord mass effect. Moderate to severe left C6 neural foraminal stenosis. C6-C7:  Minor disc bulging.  No stenosis. C7-T1:  Negative. IMPRESSION: 1. Positive for evidence of both Ligamentous Injury, and Abnormal Cervical Spinal Cord in the setting of trauma: - Prevertebral edema or fluid from C1 through C6. Consider anterior longitudinal ligament injury, although no discrete ligamentous disruption is identified. - mild posterior interspinous ligamentous injury at C3-C4 eccentric to the right. - Abnormal spinal cord signal from C3 to the C5 cord level. While some of this most resembles chronic Myelomalacia (see #2), a component of Acute Cord Contusion cannot be excluded - especially in the left central cord at C3. *Underlying bulky cervical spine degeneration with spinal stenosis AND spinal cord mass effect from C3-C4 (Moderate) through C5-C6 (mild). Associated moderate to severe degenerative neural foraminal stenosis at the bilateral C4, C5, and left C6 nerve levels. 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 Study discussed by telephone with Dr. Drema Pry on 08/19/2022 at 04:37 . Electronically Signed   By: Odessa Fleming M.D.   On: 08/19/2022 04:41   CT ANGIO HEAD NECK W WO CM  Result Date: 08/19/2022 CLINICAL DATA:  Neck trauma, midline tenderness EXAM: CT ANGIOGRAPHY HEAD AND NECK WITH AND WITHOUT CONTRAST TECHNIQUE: Multidetector CT imaging of the head and neck was performed using the standard protocol during bolus administration of intravenous contrast. Multiplanar CT image reconstructions and MIPs were obtained to evaluate the vascular anatomy. Carotid stenosis measurements (when applicable) are obtained utilizing NASCET criteria,  using the distal internal carotid diameter as the denominator. RADIATION DOSE REDUCTION: This exam was performed according to the departmental dose-optimization program which includes automated exposure control, adjustment  of the mA and/or kV according to patient size and/or use of iterative reconstruction technique. CONTRAST:  75mL OMNIPAQUE IOHEXOL 350 MG/ML SOLN COMPARISON:  No prior CTA available, correlation is made with CT head 08/19/2022 FINDINGS: CT HEAD FINDINGS For noncontrast findings, please see same day CT head. CTA NECK FINDINGS Aortic arch: Standard branching. Imaged portion shows no evidence of aneurysm or dissection. No significant stenosis of the major arch vessel origins. Right carotid system: No evidence of stenosis, dissection, or occlusion. Left carotid system: No evidence of stenosis, dissection, or occlusion. Vertebral arteries: No evidence of stenosis, dissection, or occlusion. Skeleton: No acute osseous abnormality. Degenerative changes in the cervical spine. Please see same day CT cervical spine for more detailed findings. Other neck: Negative. Upper chest: No focal pulmonary opacity or pleural effusion. Review of the MIP images confirms the above findings CTA HEAD FINDINGS Anterior circulation: Both internal carotid arteries are patent to the termini, without significant stenosis. A1 segments patent. Normal anterior communicating artery. Anterior cerebral arteries are patent to their distal aspects without significant stenosis. No M1 stenosis or occlusion. MCA branches perfused to their distal aspects without significant stenosis. Posterior circulation: Vertebral arteries patent to the vertebrobasilar junction without significant stenosis. Posterior inferior cerebellar arteries patent proximally. Basilar patent to its distal aspect without significant stenosis. Superior cerebellar arteries patent proximally. Patent left P1. Aplastic right P 1. Fetal origin of the right PCA from the right posterior communicating artery. PCAs perfused to their distal aspects without significant stenosis. The left posterior communicating artery is not visualized. Venous sinuses: As permitted by contrast timing, patent.  Anatomic variants: Fetal origin of the right PCA. Review of the MIP images confirms the above findings IMPRESSION: 1. No intracranial large vessel occlusion or significant stenosis. 2. No hemodynamically significant stenosis in the neck. 3. No evidence of vascular injury. Electronically Signed   By: Wiliam Ke M.D.   On: 08/19/2022 02:41   CT HEAD WO CONTRAST  Result Date: 08/19/2022 CLINICAL DATA:  62 year old presents following a fall injury at home, with posterior neck and right shoulder impact. EXAM: CT HEAD WITHOUT CONTRAST CT CERVICAL SPINE WITHOUT CONTRAST CT CHEST, ABDOMEN AND PELVIS WITH CONTRAST TECHNIQUE: Contiguous axial images were obtained from the base of the skull through the vertex without intravenous contrast. Multidetector CT imaging of the cervical spine was performed without intravenous contrast. Multiplanar CT image reconstructions were also generated. Multidetector CT imaging of the chest, abdomen and pelvis was performed following the standard protocol during bolus administration of intravenous contrast. RADIATION DOSE REDUCTION: This exam was performed according to the departmental dose-optimization program which includes automated exposure control, adjustment of the mA and/or kV according to patient size and/or use of iterative reconstruction technique. CONTRAST:  75mL OMNIPAQUE IOHEXOL 350 MG/ML SOLN COMPARISON:  CT scan head, cervical spine, and chest, abdomen and pelvis CT with contrast all obtained 12/23/2005. Also right upper quadrant ultrasound 09/10/2021. FINDINGS: CT HEAD FINDINGS Brain: No evidence of acute infarction, hemorrhage, hydrocephalus, extra-axial collection or mass lesion/mass effect. Vascular: No hyperdense vessel or unexpected calcification. Skull: No appreciable scalp hematoma. Negative for fractures or focal lesions. Sinuses/Orbits: Negative orbits. Mild membrane disease in the paranasal sinuses noted without fluid levels. No mastoid effusion. Right deviated  nasal septum with right-sided spurring. Other: None. CT CERVICAL FINDINGS Alignment: There is a slight reversal of  the usual cervical lordosis. No levels show significant listhesis. Bone-on-bone anterior atlantodental joint space loss is again seen with osteophytes. Skull base and vertebrae: No acute fracture is evident. No primary bone lesion or focal pathologic process. Soft tissues and spinal canal: No prevertebral fluid or swelling. No visible canal hematoma. There are calcifications of the left-greater-than-right carotid bifurcations. No laryngeal mass. Disc levels: There are congenitally short pedicles reducing the effective AP diameter of the thecal sac to as little as 6.5 mm at some levels. The discs are degenerated with bidirectional osteophytes from C3-4 through C5-6, where dorsal disc osteophyte complexes cause moderate to severe spinal canal stenosis with spondylotic cord compression all 3 levels. Other levels do not show as significant soft tissue or bony encroachment on the thecal sac, but at C2-3 there is also a left paracentral disc herniation with indentation on the ventral surface of the left hemicord. Due to uncinate joint spurring, there is degenerative foraminal stenosis which is moderate on the right at C3-4, bilaterally moderate at C4-5, mild on the left at C5-6. Other:  None. CT CHEST FINDINGS Cardiovascular: The cardiac size is normal. There is no pericardial effusion. There is calcification in the proximal LAD coronary artery only, mild aortic calcific plaques and mild aortic tortuosity. There are minimal calcifications in the great vessels. There is no aortic or great vessel aneurysm, stenosis or dissection. There is no arterial or venous dilatation or central arterial embolus. Mediastinum/Nodes: No enlarged mediastinal, hilar, or axillary lymph nodes. Thyroid gland, trachea, and esophagus demonstrate no significant findings. Both main bronchi are clear. Lungs/Pleura: There are mild  centrilobular and paraseptal emphysematous changes in the lung apices. There is no pleural effusion or pneumothorax. There is mild posterior atelectasis and scattered linear scar-like opacities in both bases. No pulmonary contusion or infiltrates are seen. There is new demonstration of a pleural-based rounded noncalcified nodule in the posterolateral base of the right lower lobe measuring 1.2 x 0.9 cm on 6:159, with pleurodiaphragmatic stranding. There previously were contusive changes in the area as well as a right lower lobe laceration posteriorly, and it is possible this could be a nodular scar but it is nonspecific. There is also a newly noted 3 mm right upper lobe suprahilar nodule on 6:70. The remaining lungs are free of nodules. The remaining lungs are clear. There is eventration and asymmetric chronic elevation of the anterior right diaphragm. Musculoskeletal: There is chronic healed fracture deformity of the right clavicle, previously acute. There are healed fracture deformities of some of the right ribs. No acute displaced rib fracture or sternal injury is seen. Visualized portions of the shoulder girdles are intact, as visualized. No spinal compression fracture is evident. CT ABDOMEN PELVIS FINDINGS Hepatobiliary: No hepatic injury or perihepatic hematoma. Gallbladder is unremarkable. There is no hepatic mass enhancement and no biliary dilatation. The liver is 19 cm length with slight steatosis. Pancreas: No abnormality. Spleen: No abnormality. No acute traumatic findings. No splenomegaly. Adrenals/Urinary Tract: No adrenal hemorrhage or renal injury identified. Bladder is unremarkable. There is no mass enhancement in the adrenal glands and kidneys. Perinephric stranding is again noted symmetrically. There is no urinary stone or obstruction. Stomach/Bowel: No dilatation or wall thickening including of the appendix. Advanced sigmoid diverticulosis. No findings of acute diverticulitis or colitis.  Vascular/Lymphatic: Aortic atherosclerosis. No enlarged abdominal or pelvic lymph nodes. Reproductive: No prostatomegaly. Calcification is again seen in the left lobe. Other: There is no free air, free fluid, free hemorrhage or incarcerated hernia. Multiple pelvic phleboliths Musculoskeletal:  There are degenerative changes of the lumbar spine, bridging osteophytes across the left SI joint. Chronic trabecular coarsening with interspersed well corticated lucencies, are again seen in the right iliac wing and unchanged consistent with a chronic process. No regional skeletal fracture is evident. There is a sizable left paracentral L4-5 disc herniation partially effacing the left hemicanal and with likely mass effect at least on the descending left L5 nerve root if not also on the exiting left L4 nerve root in the proximal left foramen. This was not seen in 2007. IMPRESSION: 1. No acute intracranial CT findings or depressed skull fractures. 2. Cervical degenerative changes without evidence of fractures or listhesis. 3. Moderate to severe spinal canal stenosis with cord compression at C3-4, C4-5, and C5-6 due to short pedicles and dorsal disc osteophyte complexes. 4. No acute trauma related findings in the chest, abdomen or pelvis. 5. 1.2 x 0.9 cm rounded pleural-based nodule in the posterolateral base of the right lower lobe with pleurodiaphragmatic stranding. This could be a nodular scar, but is nonspecific. Consider one of the following in 3 months for both low-risk and high-risk individuals: (a) repeat chest CT, (b) follow-up PET-CT, or (c) tissue sampling. This recommendation follows the consensus statement: Guidelines for Management of Incidental Pulmonary Nodules Detected on CT Images: From the Fleischner Society 2017; Radiology 2017; 284:228-243. 6. 3 mm right upper lobe suprahilar nodule, also new. 7. Aortic and coronary artery atherosclerosis. 8. Diverticulosis without evidence of diverticulitis. 9. 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 Acuity indeterminate. 10. Emphysema. Emphysema (ICD10-J43.9). Electronically Signed   By: Almira Bar M.D.   On: 08/19/2022 02:17   CT CHEST ABDOMEN PELVIS W CONTRAST  Result Date: 08/19/2022 CLINICAL DATA:  62 year old presents following a fall injury at home, with posterior neck and right shoulder impact. EXAM: CT HEAD WITHOUT CONTRAST CT CERVICAL SPINE WITHOUT CONTRAST CT CHEST, ABDOMEN AND PELVIS WITH CONTRAST TECHNIQUE: Contiguous axial images were obtained from the base of the skull through the vertex without intravenous contrast. Multidetector CT imaging of the cervical spine was performed without intravenous contrast. Multiplanar CT image reconstructions were also generated. Multidetector CT imaging of the chest, abdomen and pelvis was performed following the standard protocol during bolus administration of intravenous contrast. RADIATION DOSE REDUCTION: This exam was performed according to the departmental dose-optimization program which includes automated exposure control, adjustment of the mA and/or kV according to patient size and/or use of iterative reconstruction technique. CONTRAST:  75mL OMNIPAQUE IOHEXOL 350 MG/ML SOLN COMPARISON:  CT scan head, cervical spine, and chest, abdomen and pelvis CT with contrast all obtained 12/23/2005. Also right upper quadrant ultrasound 09/10/2021. FINDINGS: CT HEAD FINDINGS Brain: No evidence of acute infarction, hemorrhage, hydrocephalus, extra-axial collection or mass lesion/mass effect. Vascular: No hyperdense vessel or unexpected calcification. Skull: No appreciable scalp hematoma. Negative for fractures or focal lesions. Sinuses/Orbits: Negative orbits. Mild membrane disease in the paranasal sinuses noted without fluid levels. No mastoid effusion. Right deviated nasal septum with right-sided spurring. Other:  None. CT CERVICAL FINDINGS Alignment: There is a slight reversal of the usual cervical lordosis. No levels show significant listhesis. Bone-on-bone anterior atlantodental joint space loss is again seen with osteophytes. Skull base and vertebrae: No acute fracture is evident. No primary bone lesion or focal pathologic process. Soft tissues and spinal canal: No prevertebral fluid or swelling. No visible canal hematoma. There are calcifications of the left-greater-than-right  carotid bifurcations. No laryngeal mass. Disc levels: There are congenitally short pedicles reducing the effective AP diameter of the thecal sac to as little as 6.5 mm at some levels. The discs are degenerated with bidirectional osteophytes from C3-4 through C5-6, where dorsal disc osteophyte complexes cause moderate to severe spinal canal stenosis with spondylotic cord compression all 3 levels. Other levels do not show as significant soft tissue or bony encroachment on the thecal sac, but at C2-3 there is also a left paracentral disc herniation with indentation on the ventral surface of the left hemicord. Due to uncinate joint spurring, there is degenerative foraminal stenosis which is moderate on the right at C3-4, bilaterally moderate at C4-5, mild on the left at C5-6. Other:  None. CT CHEST FINDINGS Cardiovascular: The cardiac size is normal. There is no pericardial effusion. There is calcification in the proximal LAD coronary artery only, mild aortic calcific plaques and mild aortic tortuosity. There are minimal calcifications in the great vessels. There is no aortic or great vessel aneurysm, stenosis or dissection. There is no arterial or venous dilatation or central arterial embolus. Mediastinum/Nodes: No enlarged mediastinal, hilar, or axillary lymph nodes. Thyroid gland, trachea, and esophagus demonstrate no significant findings. Both main bronchi are clear. Lungs/Pleura: There are mild centrilobular and paraseptal emphysematous changes in  the lung apices. There is no pleural effusion or pneumothorax. There is mild posterior atelectasis and scattered linear scar-like opacities in both bases. No pulmonary contusion or infiltrates are seen. There is new demonstration of a pleural-based rounded noncalcified nodule in the posterolateral base of the right lower lobe measuring 1.2 x 0.9 cm on 6:159, with pleurodiaphragmatic stranding. There previously were contusive changes in the area as well as a right lower lobe laceration posteriorly, and it is possible this could be a nodular scar but it is nonspecific. There is also a newly noted 3 mm right upper lobe suprahilar nodule on 6:70. The remaining lungs are free of nodules. The remaining lungs are clear. There is eventration and asymmetric chronic elevation of the anterior right diaphragm. Musculoskeletal: There is chronic healed fracture deformity of the right clavicle, previously acute. There are healed fracture deformities of some of the right ribs. No acute displaced rib fracture or sternal injury is seen. Visualized portions of the shoulder girdles are intact, as visualized. No spinal compression fracture is evident. CT ABDOMEN PELVIS FINDINGS Hepatobiliary: No hepatic injury or perihepatic hematoma. Gallbladder is unremarkable. There is no hepatic mass enhancement and no biliary dilatation. The liver is 19 cm length with slight steatosis. Pancreas: No abnormality. Spleen: No abnormality. No acute traumatic findings. No splenomegaly. Adrenals/Urinary Tract: No adrenal hemorrhage or renal injury identified. Bladder is unremarkable. There is no mass enhancement in the adrenal glands and kidneys. Perinephric stranding is again noted symmetrically. There is no urinary stone or obstruction. Stomach/Bowel: No dilatation or wall thickening including of the appendix. Advanced sigmoid diverticulosis. No findings of acute diverticulitis or colitis. Vascular/Lymphatic: Aortic atherosclerosis. No enlarged  abdominal or pelvic lymph nodes. Reproductive: No prostatomegaly. Calcification is again seen in the left lobe. Other: There is no free air, free fluid, free hemorrhage or incarcerated hernia. Multiple pelvic phleboliths Musculoskeletal: There are degenerative changes of the lumbar spine, bridging osteophytes across the left SI joint. Chronic trabecular coarsening with interspersed well corticated lucencies, are again seen in the right iliac wing and unchanged consistent with a chronic process. No regional skeletal fracture is evident. There is a sizable left paracentral L4-5 disc herniation partially effacing the left  hemicanal and with likely mass effect at least on the descending left L5 nerve root if not also on the exiting left L4 nerve root in the proximal left foramen. This was not seen in 2007. IMPRESSION: 1. No acute intracranial CT findings or depressed skull fractures. 2. Cervical degenerative changes without evidence of fractures or listhesis. 3. Moderate to severe spinal canal stenosis with cord compression at C3-4, C4-5, and C5-6 due to short pedicles and dorsal disc osteophyte complexes. 4. No acute trauma related findings in the chest, abdomen or pelvis. 5. 1.2 x 0.9 cm rounded pleural-based nodule in the posterolateral base of the right lower lobe with pleurodiaphragmatic stranding. This could be a nodular scar, but is nonspecific. Consider one of the following in 3 months for both low-risk and high-risk individuals: (a) repeat chest CT, (b) follow-up PET-CT, or (c) tissue sampling. This recommendation follows the consensus statement: Guidelines for Management of Incidental Pulmonary Nodules Detected on CT Images: From the Fleischner Society 2017; Radiology 2017; 284:228-243. 6. 3 mm right upper lobe suprahilar nodule, also new. 7. Aortic and coronary artery atherosclerosis. 8. Diverticulosis without evidence of diverticulitis. 9. 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 Acuity indeterminate. 10. Emphysema. Emphysema (ICD10-J43.9). Electronically Signed   By: Almira Bar M.D.   On: 08/19/2022 02:17   CT C-SPINE NO CHARGE  Result Date: 08/19/2022 CLINICAL DATA:  62 year old presents following a fall injury at home, with posterior neck and right shoulder impact. EXAM: CT HEAD WITHOUT CONTRAST CT CERVICAL SPINE WITHOUT CONTRAST CT CHEST, ABDOMEN AND PELVIS WITH CONTRAST TECHNIQUE: Contiguous axial images were obtained from the base of the skull through the vertex without intravenous contrast. Multidetector CT imaging of the cervical spine was performed without intravenous contrast. Multiplanar CT image reconstructions were also generated. Multidetector CT imaging of the chest, abdomen and pelvis was performed following the standard protocol during bolus administration of intravenous contrast. RADIATION DOSE REDUCTION: This exam was performed according to the departmental dose-optimization program which includes automated exposure control, adjustment of the mA and/or kV according to patient size and/or use of iterative reconstruction technique. CONTRAST:  75mL OMNIPAQUE IOHEXOL 350 MG/ML SOLN COMPARISON:  CT scan head, cervical spine, and chest, abdomen and pelvis CT with contrast all obtained 12/23/2005. Also right upper quadrant ultrasound 09/10/2021. FINDINGS: CT HEAD FINDINGS Brain: No evidence of acute infarction, hemorrhage, hydrocephalus, extra-axial collection or mass lesion/mass effect. Vascular: No hyperdense vessel or unexpected calcification. Skull: No appreciable scalp hematoma. Negative for fractures or focal lesions. Sinuses/Orbits: Negative orbits. Mild membrane disease in the paranasal sinuses noted without fluid levels. No mastoid effusion. Right deviated nasal septum with right-sided spurring. Other: None. CT CERVICAL FINDINGS Alignment: There is a slight reversal of the usual  cervical lordosis. No levels show significant listhesis. Bone-on-bone anterior atlantodental joint space loss is again seen with osteophytes. Skull base and vertebrae: No acute fracture is evident. No primary bone lesion or focal pathologic process. Soft tissues and spinal canal: No prevertebral fluid or swelling. No visible canal hematoma. There are calcifications of the left-greater-than-right carotid bifurcations. No laryngeal mass. Disc levels: There are congenitally short pedicles reducing the effective AP diameter of the thecal sac to as little as 6.5 mm at some levels. The discs are degenerated with bidirectional osteophytes from C3-4 through C5-6, where dorsal disc osteophyte complexes cause moderate to severe spinal canal stenosis with spondylotic cord compression all 3  levels. Other levels do not show as significant soft tissue or bony encroachment on the thecal sac, but at C2-3 there is also a left paracentral disc herniation with indentation on the ventral surface of the left hemicord. Due to uncinate joint spurring, there is degenerative foraminal stenosis which is moderate on the right at C3-4, bilaterally moderate at C4-5, mild on the left at C5-6. Other:  None. CT CHEST FINDINGS Cardiovascular: The cardiac size is normal. There is no pericardial effusion. There is calcification in the proximal LAD coronary artery only, mild aortic calcific plaques and mild aortic tortuosity. There are minimal calcifications in the great vessels. There is no aortic or great vessel aneurysm, stenosis or dissection. There is no arterial or venous dilatation or central arterial embolus. Mediastinum/Nodes: No enlarged mediastinal, hilar, or axillary lymph nodes. Thyroid gland, trachea, and esophagus demonstrate no significant findings. Both main bronchi are clear. Lungs/Pleura: There are mild centrilobular and paraseptal emphysematous changes in the lung apices. There is no pleural effusion or pneumothorax. There is mild  posterior atelectasis and scattered linear scar-like opacities in both bases. No pulmonary contusion or infiltrates are seen. There is new demonstration of a pleural-based rounded noncalcified nodule in the posterolateral base of the right lower lobe measuring 1.2 x 0.9 cm on 6:159, with pleurodiaphragmatic stranding. There previously were contusive changes in the area as well as a right lower lobe laceration posteriorly, and it is possible this could be a nodular scar but it is nonspecific. There is also a newly noted 3 mm right upper lobe suprahilar nodule on 6:70. The remaining lungs are free of nodules. The remaining lungs are clear. There is eventration and asymmetric chronic elevation of the anterior right diaphragm. Musculoskeletal: There is chronic healed fracture deformity of the right clavicle, previously acute. There are healed fracture deformities of some of the right ribs. No acute displaced rib fracture or sternal injury is seen. Visualized portions of the shoulder girdles are intact, as visualized. No spinal compression fracture is evident. CT ABDOMEN PELVIS FINDINGS Hepatobiliary: No hepatic injury or perihepatic hematoma. Gallbladder is unremarkable. There is no hepatic mass enhancement and no biliary dilatation. The liver is 19 cm length with slight steatosis. Pancreas: No abnormality. Spleen: No abnormality. No acute traumatic findings. No splenomegaly. Adrenals/Urinary Tract: No adrenal hemorrhage or renal injury identified. Bladder is unremarkable. There is no mass enhancement in the adrenal glands and kidneys. Perinephric stranding is again noted symmetrically. There is no urinary stone or obstruction. Stomach/Bowel: No dilatation or wall thickening including of the appendix. Advanced sigmoid diverticulosis. No findings of acute diverticulitis or colitis. Vascular/Lymphatic: Aortic atherosclerosis. No enlarged abdominal or pelvic lymph nodes. Reproductive: No prostatomegaly. Calcification is  again seen in the left lobe. Other: There is no free air, free fluid, free hemorrhage or incarcerated hernia. Multiple pelvic phleboliths Musculoskeletal: There are degenerative changes of the lumbar spine, bridging osteophytes across the left SI joint. Chronic trabecular coarsening with interspersed well corticated lucencies, are again seen in the right iliac wing and unchanged consistent with a chronic process. No regional skeletal fracture is evident. There is a sizable left paracentral L4-5 disc herniation partially effacing the left hemicanal and with likely mass effect at least on the descending left L5 nerve root if not also on the exiting left L4 nerve root in the proximal left foramen. This was not seen in 2007. IMPRESSION: 1. No acute intracranial CT findings or depressed skull fractures. 2. Cervical degenerative changes without evidence of fractures or listhesis. 3. Moderate  to severe spinal canal stenosis with cord compression at C3-4, C4-5, and C5-6 due to short pedicles and dorsal disc osteophyte complexes. 4. No acute trauma related findings in the chest, abdomen or pelvis. 5. 1.2 x 0.9 cm rounded pleural-based nodule in the posterolateral base of the right lower lobe with pleurodiaphragmatic stranding. This could be a nodular scar, but is nonspecific. Consider one of the following in 3 months for both low-risk and high-risk individuals: (a) repeat chest CT, (b) follow-up PET-CT, or (c) tissue sampling. This recommendation follows the consensus statement: Guidelines for Management of Incidental Pulmonary Nodules Detected on CT Images: From the Fleischner Society 2017; Radiology 2017; 284:228-243. 6. 3 mm right upper lobe suprahilar nodule, also new. 7. Aortic and coronary artery atherosclerosis. 8. Diverticulosis without evidence of diverticulitis. 9. 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 Acuity indeterminate. 10. Emphysema. Emphysema (ICD10-J43.9). Electronically Signed   By: Almira Bar M.D.   On: 08/19/2022 02:17   DG Shoulder Right Port  Result Date: 08/19/2022 CLINICAL DATA:  Status post fall. EXAM: RIGHT SHOULDER - 1 VIEW COMPARISON:  December 23, 2005 FINDINGS: There is no evidence of an acute fracture or dislocation. A chronic fracture deformity is seen involving the mid to distal right clavicle. There is no evidence of arthropathy or other focal bone abnormality. Soft tissues are unremarkable. IMPRESSION: Chronic fracture deformity of the mid to distal right clavicle. Electronically Signed   By: Aram Candela M.D.   On: 08/19/2022 01:40    Pertinent items noted in HPI and remainder of comprehensive ROS otherwise negative.  Blood pressure (!) 163/79, pulse 67, temperature 98.4 F (36.9 C), temperature source Oral, resp. rate 15, weight 80.3 kg, SpO2 100 %.  The patient is awake and alert.  He is oriented and appropriate.  His speech is fluent.  His judgment and insight are intact.  Cranial nerve function is normal bilateral.  Motor examination reveals 4/5 strength in both deltoid and biceps muscles groups bilaterally.  Wrist extension 4-/5 in his right wrist and 2/5 in his left wrist.  Triceps function 3/5 in his right side 1-2/5 in his left side.  Grips 2-3/5 on the right, 1/5 on the left.  Intrinsics 2/5 on the right 1/5 on the left.  Lower extremity motor function patient with 4-/5 strength in his right lower extremity and barely 3/5 strength in his left lower extremity.  Sensory examination reveals decreased sensation with some hyperesthesia from C4 distally.  Reflexes are increased.  Patient does have rectal tone and sacral sensation.  Examination head ears eyes nose and throat is unremarked.  Chest and abdomen are benign.  Extremities are free from injury or deformity.  I reviewed the patient's MRI scan of his cervical spine.  This demonstrates evidence of  significant multilevel cervical degenerative disease with associated disc space collapse and associated spondylosis.  Patient with critical spinal stenosis with severe cord compression and cord signal change at C3-4.  Severe stenosis at C4-5 and C5-6 without marked cord signal abnormality at these levels.  There is some changes posteriorly from C3-C6 with some worrisome findings for some posterior ligamentous injury.  There is no evidence of fracture on his CT scan.  There is no evidence of malalignment. Assessment/Plan Patient is status post micturition related syncope from vasovagal episode.  Patient with severe pre-existing spinal stenosis with subsequent incomplete spinal cord injury.  Plan  hospital admission, IV steroids, and pain control.  I have discussed situation with the patient and his wife.  I have recommended that we move forward with multilevel anterior cervical decompression and fusion surgery to decompress the spinal cord and stabilize the spine to hopefully improve his symptoms and provide prevention from further injury.  Will look to do surgery over the next couple days.  Kathaleen Maser Dawn Kiper 08/19/2022, 6:32 AM

## 2022-08-19 NOTE — ED Triage Notes (Signed)
Pt in from home via [facility] as Level 2 syncopal fall, while walking to the bathroom tonight. Pt states he thinks that he struck his posterior neck and R shoulder vs marble countertop when he fell. [facility] reports that pt has weakness bilaterally to all 4 extremities, warm to touch. Pt c/o midline neck tenderness and R shoulder pain that radiates to L shoulder,  Manual pressure 156/94 on arrival

## 2022-08-19 NOTE — ED Notes (Signed)
..  Trauma Response Nurse Documentation   Kenneth Flores is a 62 y.o. male arriving to [facility] ED via EMS  On No antithrombotic. Trauma was activated as a Level 2 by charge nurse based on the following trauma criteria Paralysis associated with trauma,.  Patient cleared for CT by Dr. Eudelia Bunch. Pt transported to CT with trauma response nurse present to monitor. RN remained with the patient throughout their absence from the department for clinical observation.   GCS 15.  History   Past Medical History:  Diagnosis Date   Hepatitis C      Past Surgical History:  Procedure Laterality Date   NO PAST SURGERIES         Initial Focused Assessment (If applicable, or please see trauma documentation):  Bilat UE and LE weakness, very warm to touch, ccspine precautions by c collar, log roll.  CT's Completed:   CT Head, CT Maxillofacial, CT C-Spine, and CT Angio Head  MRI C Cspine Interventions:  -Initial trauma assessment -transport to/from CT -EKG -Log roll   Plan for disposition:  Admission to floor   Consults completed:  Neurosurgeon at 0502.  Event Summary: Pt transported by EMS from after experiencing syncopal episode while urinating, pt reports he struck back of his head/neck on marble in bathroom. Pt reports since that time pain in low cspine with extreme weakness to extremities. Minimal grip achieved to UE and reflex movement noted to LE. Pt very warm to touch.  Pt arrived with c collar in place, on LSB, IV est by EMS.  [place] replaced EMS collar while maintaining spinal precautions, Additional IV's placed with BC being obtained due to fever. Pt log rolled rolled, core temp obtained at that time. Pt is A & O, shivering. Pt transported to/from CT without incident.  MRI completed and pt seen by Dr Jordan Likes, admitted for spinal cord compression.    Bedside handoff with ED RN Joselyn Glassman.    Gabriella Guile Dee  Trauma Response RN  Please call TRN at [phone] for further assistance.

## 2022-08-20 LAB — SURGICAL PCR SCREEN
MRSA, PCR: NEGATIVE
Staphylococcus aureus: NEGATIVE

## 2022-08-20 LAB — HIV ANTIBODY (ROUTINE TESTING W REFLEX): HIV Screen 4th Generation wRfx: NONREACTIVE

## 2022-08-20 NOTE — TOC Initial Note (Signed)
Transition of Care [facility]) - Initial/Assessment Note    Patient Details  Name: Kenneth Flores MRN: [id number] Date of Birth: [birth year]  Transition of Care [facility]) CM/SW Contact:    Kermit Balo, RN Phone Number: 08/20/2022, 2:16 PM  Clinical Narrative:                 Pt is s/p cervical cord injury. Plan is for OR tomorrow. After surgery will need therapy evals to determine rehab needs.  Pt was driving self prior. His wife can provide needed transportation. He states she is [employer] RN and sees pts through the day and stops by home also.  Pt wasn't taking any prescription medications prior to the hospital.  TOC following.  Expected Discharge Plan: IP Rehab Facility Barriers to Discharge: Continued Medical Work up   Patient Goals and CMS Choice   CMS Medicare.gov Compare Post Acute Care list provided to:: Patient Choice offered to / list presented to : Patient, Spouse      Expected Discharge Plan and Services       Living arrangements for the past 2 months: Single Family Home                                      Prior Living Arrangements/Services Living arrangements for the past 2 months: Single Family Home Lives with:: Spouse Patient language and need for interpreter reviewed:: Yes Do you feel safe going back to the place where you live?: Yes          Current home services: DME (cane/ walker) Criminal Activity/Legal Involvement Pertinent to Current Situation/Hospitalization: No - Comment as needed  Activities of Daily Living Home Assistive Devices/Equipment: None ADL Screening (condition at time of admission) Patient's cognitive ability adequate to safely complete daily activities?: Yes Is the patient deaf or have difficulty hearing?: No Does the patient have difficulty seeing, even when wearing glasses/contacts?: No Does the patient have difficulty concentrating, remembering, or making decisions?: No Patient able to express need for assistance with ADLs?: Yes Does  the patient have difficulty dressing or bathing?: Yes Independently performs ADLs?: No Communication: Independent Dressing (OT): Needs assistance Is this a change from baseline?: Change from baseline, expected to last <3days Grooming: Needs assistance Is this a change from baseline?: Change from baseline, expected to last <3 days Feeding: Needs assistance Is this a change from baseline?: Change from baseline, expected to last <3 days Bathing: Needs assistance Is this a change from baseline?: Change from baseline, expected to last <3 days Toileting: Needs assistance Is this a change from baseline?: Change from baseline, expected to last <3 days In/Out Bed: Needs assistance Is this a change from baseline?: Change from baseline, expected to last <3 days Walks in Home: Needs assistance Is this a change from baseline?: Change from baseline, expected to last <3 days Does the patient have difficulty walking or climbing stairs?: Yes Weakness of Legs: None Weakness of Arms/Hands: Both  Permission Sought/Granted                  Emotional Assessment Appearance:: Appears stated age Attitude/Demeanor/Rapport: Engaged Affect (typically observed): Sad, Grieving Orientation: : Oriented to Self, Oriented to Place, Oriented to  Time, Oriented to Situation   Psych Involvement: No (comment)  Admission diagnosis:  Cervical spinal cord injury (HCC) [S14.109A] Central cord syndrome, initial encounter [facility]) [W09.811B] Patient Active Problem List   Diagnosis Date Noted   Cervical  spinal cord injury (HCC) 08/19/2022   Chronic hepatitis C without hepatic coma (HCC) 09/08/2021   Thrombocytopenia (HCC) 09/08/2021   PCP:  Norm Salt, PA Pharmacy:   [facility] [phone] Ginette Otto, [place] - [address] AT [facility] [address] [facility] [address] [place] [address] Phone: [phone] Fax: [phone]     Social Determinants of Health (SDOH) Social History: SDOH  Screenings   Food Insecurity: No Food Insecurity (08/19/2022)  Housing: Low Risk  (08/19/2022)  Transportation Needs: No Transportation Needs (08/19/2022)  Utilities: Not At Risk (08/19/2022)  Depression (PHQ2-9): Low Risk  (03/30/2022)  Tobacco Use: High Risk (08/19/2022)   SDOH Interventions:     Readmission Risk Interventions     No data to display

## 2022-08-20 NOTE — Progress Notes (Signed)
No significant change in status.  Still with dysesthetic pain in the both shoulders and upper extremities.  Pain overall however is reasonably well-controlled.  Patient is awake and alert.  He is oriented and appropriate.  Motor examination with 4/5 strength in both deltoid muscle groups and biceps muscle group.  Right upper extremity with good wrist extensor and tricep function grading out at 4/5.  He has 3/5 extensors in the right hand and 3/5 grip to the right hand.  He has 2/5 wrist extension and 2/5 triceps function on the left.  He has minimal if any grip.  He has no finger extension or intrinsic function on the left.  Motor examination with 4+5 strength in his right lower extremity and 4-/5 strength in his left lower extremity.  Patient is status post fall with resultant incomplete spinal cord injury.  Continue IV steroids and cervical collar.  Plan for operative decompression and fusion on Friday afternoon.  I discussed the risks and benefits involved with a C3-4, C4-5, C5-6 anterior cervical discectomy with interbody fusion utilizing interbody cages, local harvested autograft, and anterior plate instrumentation in hopes improving his symptoms.I discussed the possible risk and occluding but not limited to the risk of anesthesia, bleeding, infection, CSF leak, nerve root injury, spinal cord injury, fusion failure, especially, dysphagia, dysphonia, continued pain, not benefit.  Patient has been given after his questions.  He appears to understand.  He wishes to proceed with surgery.

## 2022-08-20 NOTE — Plan of Care (Signed)

## 2022-08-21 ENCOUNTER — Encounter (HOSPITAL_COMMUNITY)
Admission: EM | Disposition: A | Discharge status: Rehabilitation Facility | Payer: Self-pay | Source: Home / Self Care | Attending: Neurosurgery

## 2022-08-21 ENCOUNTER — Inpatient Hospital Stay (HOSPITAL_COMMUNITY): Payer: PRIVATE HEALTH INSURANCE

## 2022-08-21 ENCOUNTER — Encounter (HOSPITAL_COMMUNITY): Payer: Self-pay | Admitting: Neurosurgery

## 2022-08-21 ENCOUNTER — Inpatient Hospital Stay (HOSPITAL_COMMUNITY): Payer: PRIVATE HEALTH INSURANCE | Admitting: Anesthesiology

## 2022-08-21 DIAGNOSIS — M47812 Spondylosis without myelopathy or radiculopathy, cervical region: Secondary | ICD-10-CM | POA: Diagnosis not present

## 2022-08-21 DIAGNOSIS — S14159A Other incomplete lesion at unspecified level of cervical spinal cord, initial encounter: Secondary | ICD-10-CM | POA: Diagnosis not present

## 2022-08-21 DIAGNOSIS — M4802 Spinal stenosis, cervical region: Secondary | ICD-10-CM

## 2022-08-21 DIAGNOSIS — F17210 Nicotine dependence, cigarettes, uncomplicated: Secondary | ICD-10-CM

## 2022-08-21 DIAGNOSIS — B1920 Unspecified viral hepatitis C without hepatic coma: Secondary | ICD-10-CM | POA: Diagnosis not present

## 2022-08-21 HISTORY — PX: ANTERIOR CERVICAL DECOMP/DISCECTOMY FUSION: SHX1161

## 2022-08-21 LAB — ABO/RH: ABO/RH(D): O POS

## 2022-08-21 LAB — CULTURE, BLOOD (ROUTINE X 2): Special Requests: ADEQUATE

## 2022-08-21 LAB — TYPE AND SCREEN
ABO/RH(D): O POS
Antibody Screen: NEGATIVE

## 2022-08-21 SURGERY — ANTERIOR CERVICAL DECOMPRESSION/DISCECTOMY FUSION 3 LEVELS
Anesthesia: General | Site: Spine Cervical

## 2022-08-21 MED ORDER — CEFAZOLIN SODIUM-DEXTROSE 2-4 GM/100ML-% IV SOLN
INTRAVENOUS | Status: AC
  Filled 2022-08-21: qty 100

## 2022-08-21 MED ORDER — FENTANYL CITRATE (PF) 100 MCG/2ML IJ SOLN
INTRAMUSCULAR | Status: AC
  Filled 2022-08-21: qty 2

## 2022-08-21 MED ORDER — ONDANSETRON HCL 4 MG PO TABS: 4.0000 mg | ORAL_TABLET | Freq: Four times a day (QID) | ORAL | Status: DC | PRN

## 2022-08-21 MED ORDER — THROMBIN 5000 UNITS EX SOLR
CUTANEOUS | Status: AC
  Filled 2022-08-21: qty 5000

## 2022-08-21 MED ORDER — CEFAZOLIN SODIUM-DEXTROSE 1-4 GM/50ML-% IV SOLN
1.0000 g | Freq: Three times a day (TID) | INTRAVENOUS | Status: AC
  Administered 2022-08-21 – 2022-08-22 (×2): 1 g via INTRAVENOUS
  Filled 2022-08-21 (×2): qty 50

## 2022-08-21 MED ORDER — CEFAZOLIN SODIUM-DEXTROSE 2-4 GM/100ML-% IV SOLN
2.0000 g | INTRAVENOUS | Status: AC
  Administered 2022-08-21: 2 g via INTRAVENOUS

## 2022-08-21 MED ORDER — 0.9 % SODIUM CHLORIDE (POUR BTL) OPTIME
TOPICAL | Status: DC | PRN
  Administered 2022-08-21: 1000 mL

## 2022-08-21 MED ORDER — SODIUM CHLORIDE 0.9% FLUSH
3.0000 mL | INTRAVENOUS | Status: DC | PRN
  Administered 2022-08-24: 3 mL via INTRAVENOUS

## 2022-08-21 MED ORDER — PHENOL 1.4 % MT LIQD
1.0000 | OROMUCOSAL | Status: DC | PRN
  Administered 2022-08-26: 1 via OROMUCOSAL
  Filled 2022-08-21: qty 177

## 2022-08-21 MED ORDER — THROMBIN 5000 UNITS EX SOLR
OROMUCOSAL | Status: DC | PRN
  Administered 2022-08-21: 5 mL via TOPICAL

## 2022-08-21 MED ORDER — HYDROMORPHONE HCL 1 MG/ML IJ SOLN
1.0000 mg | INTRAMUSCULAR | Status: DC | PRN
  Administered 2022-08-21 – 2022-08-27 (×40): 1 mg via INTRAVENOUS
  Filled 2022-08-21 (×41): qty 1

## 2022-08-21 MED ORDER — LACTATED RINGERS IV SOLN: INTRAVENOUS | Status: DC

## 2022-08-21 MED ORDER — PROPOFOL 10 MG/ML IV BOLUS
INTRAVENOUS | Status: AC
  Filled 2022-08-21: qty 20

## 2022-08-21 MED ORDER — DEXAMETHASONE 4 MG PO TABS
4.0000 mg | ORAL_TABLET | Freq: Four times a day (QID) | ORAL | Status: AC
  Administered 2022-08-22: 4 mg via ORAL
  Filled 2022-08-21 (×4): qty 1

## 2022-08-21 MED ORDER — HYDROCODONE-ACETAMINOPHEN 5-325 MG PO TABS: 1.0000 | ORAL_TABLET | ORAL | Status: DC | PRN

## 2022-08-21 MED ORDER — PHENYLEPHRINE HCL-NACL 20-0.9 MG/250ML-% IV SOLN
INTRAVENOUS | Status: DC | PRN
  Administered 2022-08-21: 25 ug/min via INTRAVENOUS

## 2022-08-21 MED ORDER — ACETAMINOPHEN 325 MG PO TABS
650.0000 mg | ORAL_TABLET | ORAL | Status: DC | PRN
  Administered 2022-08-22 – 2022-08-23 (×2): 650 mg via ORAL
  Filled 2022-08-21 (×3): qty 2

## 2022-08-21 MED ORDER — DEXMEDETOMIDINE HCL IN NACL 80 MCG/20ML IV SOLN
INTRAVENOUS | Status: DC | PRN
  Administered 2022-08-21 (×2): 8 ug via INTRAVENOUS

## 2022-08-21 MED ORDER — MIDAZOLAM HCL 2 MG/2ML IJ SOLN
INTRAMUSCULAR | Status: DC | PRN
  Administered 2022-08-21: 2 mg via INTRAVENOUS

## 2022-08-21 MED ORDER — DEXAMETHASONE SODIUM PHOSPHATE 4 MG/ML IJ SOLN
4.0000 mg | Freq: Four times a day (QID) | INTRAMUSCULAR | Status: AC
  Administered 2022-08-21 – 2022-08-23 (×7): 4 mg via INTRAVENOUS
  Filled 2022-08-21 (×7): qty 1

## 2022-08-21 MED ORDER — ONDANSETRON HCL 4 MG/2ML IJ SOLN
INTRAMUSCULAR | Status: DC | PRN
  Administered 2022-08-21: 4 mg via INTRAVENOUS

## 2022-08-21 MED ORDER — ACETAMINOPHEN 650 MG RE SUPP: 650.0000 mg | RECTAL | Status: DC | PRN

## 2022-08-21 MED ORDER — ONDANSETRON HCL 4 MG/2ML IJ SOLN
INTRAMUSCULAR | Status: AC
  Filled 2022-08-21: qty 2

## 2022-08-21 MED ORDER — FENTANYL CITRATE (PF) 250 MCG/5ML IJ SOLN
INTRAMUSCULAR | Status: DC | PRN
  Administered 2022-08-21 (×3): 50 ug via INTRAVENOUS
  Administered 2022-08-21: 100 ug via INTRAVENOUS

## 2022-08-21 MED ORDER — HYDROMORPHONE HCL 1 MG/ML IJ SOLN
INTRAMUSCULAR | Status: DC | PRN
  Administered 2022-08-21 (×2): .25 mg via INTRAVENOUS

## 2022-08-21 MED ORDER — MENTHOL 3 MG MT LOZG
1.0000 | LOZENGE | OROMUCOSAL | Status: DC | PRN
  Administered 2022-08-21 (×2): 3 mg via ORAL
  Filled 2022-08-21: qty 9

## 2022-08-21 MED ORDER — FENTANYL CITRATE (PF) 100 MCG/2ML IJ SOLN
25.0000 ug | INTRAMUSCULAR | Status: DC | PRN
  Administered 2022-08-21 (×3): 50 ug via INTRAVENOUS

## 2022-08-21 MED ORDER — LIDOCAINE 2% (20 MG/ML) 5 ML SYRINGE
INTRAMUSCULAR | Status: AC
  Filled 2022-08-21: qty 5

## 2022-08-21 MED ORDER — ONDANSETRON HCL 4 MG/2ML IJ SOLN: 4.0000 mg | Freq: Four times a day (QID) | INTRAMUSCULAR | Status: DC | PRN

## 2022-08-21 MED ORDER — SODIUM CHLORIDE 0.9% FLUSH
3.0000 mL | Freq: Two times a day (BID) | INTRAVENOUS | Status: DC
  Administered 2022-08-21 – 2022-08-27 (×10): 3 mL via INTRAVENOUS

## 2022-08-21 MED ORDER — THROMBIN 20000 UNITS EX SOLR
CUTANEOUS | Status: AC
  Filled 2022-08-21: qty 20000

## 2022-08-21 MED ORDER — ROCURONIUM BROMIDE 10 MG/ML (PF) SYRINGE
PREFILLED_SYRINGE | INTRAVENOUS | Status: DC | PRN
  Administered 2022-08-21: 10 mg via INTRAVENOUS
  Administered 2022-08-21: 20 mg via INTRAVENOUS
  Administered 2022-08-21: 70 mg via INTRAVENOUS

## 2022-08-21 MED ORDER — HYDROCODONE-ACETAMINOPHEN 10-325 MG PO TABS
2.0000 | ORAL_TABLET | ORAL | Status: DC | PRN
  Administered 2022-08-21 – 2022-08-27 (×20): 2 via ORAL
  Filled 2022-08-21 (×22): qty 2

## 2022-08-21 MED ORDER — SODIUM CHLORIDE 0.9 % IV SOLN: 250.0000 mL | INTRAVENOUS | Status: DC

## 2022-08-21 MED ORDER — LIDOCAINE 2% (20 MG/ML) 5 ML SYRINGE
INTRAMUSCULAR | Status: DC | PRN
  Administered 2022-08-21: 40 mg via INTRAVENOUS
  Administered 2022-08-21: 60 mg via INTRAVENOUS

## 2022-08-21 MED ORDER — FENTANYL CITRATE (PF) 250 MCG/5ML IJ SOLN
INTRAMUSCULAR | Status: AC
  Filled 2022-08-21: qty 5

## 2022-08-21 MED ORDER — CHLORHEXIDINE GLUCONATE 0.12 % MT SOLN
OROMUCOSAL | Status: AC
  Administered 2022-08-21: 15 mL via OROMUCOSAL
  Filled 2022-08-21: qty 15

## 2022-08-21 MED ORDER — MIDAZOLAM HCL 2 MG/2ML IJ SOLN
INTRAMUSCULAR | Status: AC
  Filled 2022-08-21: qty 2

## 2022-08-21 MED ORDER — THROMBIN 20000 UNITS EX SOLR
CUTANEOUS | Status: DC | PRN
  Administered 2022-08-21: 20 mL via TOPICAL

## 2022-08-21 MED ORDER — DEXAMETHASONE SODIUM PHOSPHATE 10 MG/ML IJ SOLN
INTRAMUSCULAR | Status: AC
  Filled 2022-08-21: qty 1

## 2022-08-21 MED ORDER — CHLORHEXIDINE GLUCONATE 0.12 % MT SOLN: 15.0000 mL | Freq: Once | OROMUCOSAL | Status: AC

## 2022-08-21 MED ORDER — HYDROMORPHONE HCL 1 MG/ML IJ SOLN
INTRAMUSCULAR | Status: AC
  Filled 2022-08-21: qty 0.5

## 2022-08-21 MED ORDER — DEXAMETHASONE SODIUM PHOSPHATE 10 MG/ML IJ SOLN
INTRAMUSCULAR | Status: DC | PRN
  Administered 2022-08-21: 10 mg via INTRAVENOUS

## 2022-08-21 MED ORDER — SUCCINYLCHOLINE 20MG/ML (10ML) SYRINGE FOR MEDFUSION PUMP - OPTIME
INTRAMUSCULAR | Status: DC | PRN
  Administered 2022-08-21: 200 mg via INTRAVENOUS

## 2022-08-21 MED ORDER — PROPOFOL 10 MG/ML IV BOLUS
INTRAVENOUS | Status: DC | PRN
  Administered 2022-08-21: 50 mg via INTRAVENOUS
  Administered 2022-08-21: 150 mg via INTRAVENOUS

## 2022-08-21 MED ORDER — SUGAMMADEX SODIUM 200 MG/2ML IV SOLN
INTRAVENOUS | Status: DC | PRN
  Administered 2022-08-21: 200 mg via INTRAVENOUS

## 2022-08-21 MED ORDER — ORAL CARE MOUTH RINSE: 15.0000 mL | Freq: Once | OROMUCOSAL | Status: AC

## 2022-08-21 MED ORDER — ROCURONIUM BROMIDE 10 MG/ML (PF) SYRINGE
PREFILLED_SYRINGE | INTRAVENOUS | Status: AC
  Filled 2022-08-21: qty 10

## 2022-08-21 MED ORDER — CYCLOBENZAPRINE HCL 10 MG PO TABS
10.0000 mg | ORAL_TABLET | Freq: Three times a day (TID) | ORAL | Status: DC | PRN
  Administered 2022-08-22 – 2022-08-27 (×10): 10 mg via ORAL
  Filled 2022-08-21 (×11): qty 1

## 2022-08-21 SURGICAL SUPPLY — 57 items
APL SKNCLS STERI-STRIP NONHPOA (GAUZE/BANDAGES/DRESSINGS) ×1
BAG COUNTER SPONGE SURGICOUNT (BAG) ×1 IMPLANT
BAG DECANTER FOR FLEXI CONT (MISCELLANEOUS) ×1 IMPLANT
BAG SPNG CNTER NS LX DISP (BAG) ×1
BENZOIN TINCTURE PRP APPL 2/3 (GAUZE/BANDAGES/DRESSINGS) ×1 IMPLANT
BIT DRILL 13 (BIT) IMPLANT
BUR MATCHSTICK NEURO 3.0 LAGG (BURR) ×1 IMPLANT
CAGE PEEK 6X14X11 (Cage) ×2 IMPLANT
CAGE PEEK 7X14X11 (Cage) ×1 IMPLANT
CANISTER SUCT 3000ML PPV (MISCELLANEOUS) ×1 IMPLANT
COLLAR CERV LO CONTOUR FIRM DE (SOFTGOODS) IMPLANT
DRAPE C-ARM 42X72 X-RAY (DRAPES) ×2 IMPLANT
DRAPE LAPAROTOMY 100X72 PEDS (DRAPES) ×1 IMPLANT
DRAPE MICROSCOPE SLANT 54X150 (MISCELLANEOUS) ×1 IMPLANT
DURAPREP 6ML APPLICATOR 50/CS (WOUND CARE) ×1 IMPLANT
ELECT COATED BLADE 2.86 ST (ELECTRODE) ×1 IMPLANT
ELECT REM PT RETURN 9FT ADLT (ELECTROSURGICAL) ×1
ELECTRODE REM PT RTRN 9FT ADLT (ELECTROSURGICAL) ×1 IMPLANT
EVACUATOR 1/8 PVC DRAIN (DRAIN) IMPLANT
GAUZE 4X4 16PLY ~~LOC~~+RFID DBL (SPONGE) IMPLANT
GAUZE SPONGE 4X4 12PLY STRL (GAUZE/BANDAGES/DRESSINGS) ×1 IMPLANT
GLOVE ECLIPSE 9.0 STRL (GLOVE) ×1 IMPLANT
GLOVE EXAM NITRILE XL STR (GLOVE) IMPLANT
GOWN STRL REUS W/ TWL LRG LVL3 (GOWN DISPOSABLE) IMPLANT
GOWN STRL REUS W/ TWL XL LVL3 (GOWN DISPOSABLE) IMPLANT
GOWN STRL REUS W/TWL 2XL LVL3 (GOWN DISPOSABLE) IMPLANT
GOWN STRL REUS W/TWL LRG LVL3 (GOWN DISPOSABLE)
GOWN STRL REUS W/TWL XL LVL3 (GOWN DISPOSABLE)
HALTER HD/CHIN CERV TRACTION D (MISCELLANEOUS) ×1 IMPLANT
HEMOSTAT POWDER KIT SURGIFOAM (HEMOSTASIS) ×1 IMPLANT
KIT BASIN OR (CUSTOM PROCEDURE TRAY) ×1 IMPLANT
KIT TURNOVER KIT B (KITS) ×1 IMPLANT
NDL SPNL 20GX3.5 QUINCKE YW (NEEDLE) ×1 IMPLANT
NEEDLE SPNL 20GX3.5 QUINCKE YW (NEEDLE) ×1 IMPLANT
NS IRRIG 1000ML POUR BTL (IV SOLUTION) ×1 IMPLANT
PACK LAMINECTOMY NEURO (CUSTOM PROCEDURE TRAY) ×1 IMPLANT
PAD ARMBOARD 7.5X6 YLW CONV (MISCELLANEOUS) ×3 IMPLANT
PLATE 3 62.5XLCK NS SPNE CVD (Plate) IMPLANT
PLATE 3 ATLANTIS TRANS (Plate) ×1 IMPLANT
SCREW ST FIX 4 ATL 3120213 (Screw) IMPLANT
SOL ELECTROSURG ANTI STICK (MISCELLANEOUS) ×1
SOLUTION ELECTROSURG ANTI STCK (MISCELLANEOUS) ×1 IMPLANT
SPACER SPNL 11X14X6XPEEK CVD (Cage) IMPLANT
SPACER SPNL 11X14X7XPEEK CVD (Cage) IMPLANT
SPCR SPNL 11X14X6XPEEK CVD (Cage) ×2 IMPLANT
SPCR SPNL 11X14X7XPEEK CVD (Cage) ×1 IMPLANT
SPONGE INTESTINAL PEANUT (DISPOSABLE) ×1 IMPLANT
SPONGE SURGIFOAM ABS GEL 100 (HEMOSTASIS) ×1 IMPLANT
STRIP CLOSURE SKIN 1/2X4 (GAUZE/BANDAGES/DRESSINGS) ×1 IMPLANT
SUT VIC AB 3-0 SH 8-18 (SUTURE) ×1 IMPLANT
SUT VIC AB 4-0 RB1 18 (SUTURE) ×1 IMPLANT
TAPE CLOTH 4X10 WHT NS (GAUZE/BANDAGES/DRESSINGS) ×1 IMPLANT
TAPE CLOTH SURG 4X10 WHT LF (GAUZE/BANDAGES/DRESSINGS) IMPLANT
TOWEL GREEN STERILE (TOWEL DISPOSABLE) ×1 IMPLANT
TOWEL GREEN STERILE FF (TOWEL DISPOSABLE) ×1 IMPLANT
TRAP SPECIMEN MUCUS 40CC (MISCELLANEOUS) ×1 IMPLANT
WATER STERILE IRR 1000ML POUR (IV SOLUTION) ×1 IMPLANT

## 2022-08-21 NOTE — Anesthesia Procedure Notes (Signed)
Procedure Name: Intubation Date/Time: 08/21/2022 1:12 PM  Performed by: Aundria Rud, CRNAPre-anesthesia Checklist: Patient identified, Emergency Drugs available, Suction available and Patient being monitored Patient Re-evaluated:Patient Re-evaluated prior to induction Oxygen Delivery Method: Circle System Utilized Preoxygenation: Pre-oxygenation with 100% oxygen Induction Type: IV induction and Rapid sequence Ventilation: Mask ventilation without difficulty Laryngoscope Size: Glidescope and 4 Grade View: Grade I Tube type: Oral Tube size: 7.5 mm Number of attempts: 1 Airway Equipment and Method: Stylet, Oral airway and Video-laryngoscopy Placement Confirmation: ETT inserted through vocal cords under direct vision, positive ETCO2 and breath sounds checked- equal and bilateral Secured at: 24 cm Tube secured with: Tape Dental Injury: Teeth and Oropharynx as per pre-operative assessment  Comments: Patient's neck remained midline with c collar in place during intubation. Small knick on upper lip, phenylephrine applied and Vaseline.

## 2022-08-21 NOTE — Op Note (Signed)
Date of procedure: 08/21/2022  Date of dictation: Same  Service: Neurosurgery  Preoperative diagnosis: Cervical stenosis with incomplete spinal cord injury  Postoperative diagnosis: Same  Procedure Name: C3-4, C4-5, C5-6 anterior cervical discectomy with interbody fusion utilizing interbody cages, local harvested autograft, and anterior plate instrumentation  Surgeon:Tydarius Yawn A.Penny Frisbie, M.D.  Asst. Surgeon: Franky Macho, MD  Anesthesia: General  Indication: 62 year old male status post fall with resultant incomplete spinal cord injury.  Workup demonstrates evidence of severe spondylitic disease with some secondary ligamentous injury posteriorly.  Patient with critical spinal stenosis particularly at C3-4 with marked signal abnormality within the spinal cord at this level.  Patient also with severe spinal stenosis at C4-5 and C5-6.  Patient presents now for 3 level anterior cervical decompression and fusion in hopes of improving his symptoms.  Operative note: After induction of anesthesia, patient positioned supine with neck slightly extended and held placeholder traction.  Patient's anterior cervical region prepped and draped sterilely.  Incision made overlying C4-5.  Dissection performed on the right.  Retractor placed.  Fluoroscopy used.  Levels confirmed.  Disc bases at C3-4, C4-5 and C5-6 were incised.  Discectomy was performed at all 3 levels using various instruments down to level the posterior annulus.  Microscope was then brought to the field used throughout the remainder of the discectomy.  Starting at C3-4 remaining aspects of annulus and osteophytes were removed down to level the posterior logical ligament.  Posterior logical was then elevated and resected in a piecemeal fashion.  Underlying thecal sac was identified.  A wide central decompression was then performed undercutting the bodies of C3 and C4.  Decompression then proceeded to each neural foramina.  Wide anterior foraminotomies performed  on the course exiting C4 nerve root bilaterally.  At this point a very thorough decompression had been achieved.  There was no evidence of injury to the thecal sac or nerve roots.  Procedures then repeated at C4-5 and C5-6 in a similar fashion again without complications.  Wound was then irrigated.  [employer] anatomic peek cages were then packed with locally harvested autograft.  Cages were then impacted in the place at all 3 levels where there were recessed slightly from the anterior cortical margin.  Atlantis translational plate was then placed over the C3, C4, C5 and C6 levels.  This then attached under fluoroscopic guidance using 13 mm fixed angle screws to each at all 4 levels.  Screws given final tightening.  Locking screws engaged at all levels.  Final images revealed good position of the cages and the hardware at the proper operative level with normal alignment of the spine.  Wound was then irrigated.  Gelfoam was placed topically for hemostasis then removed.  Hemostasis was then completely ensured with bipolar cautery.  A medium Hemovac drain was left in the prevertebral space.  Wound was then closed in layers with Vicryl sutures.  Steri-Strips and sterile dressing were applied.  No apparent complications.  Patient tolerated the procedure well and he returns to the recovery room postop.

## 2022-08-21 NOTE — Anesthesia Preprocedure Evaluation (Addendum)
Anesthesia Evaluation  Patient identified by MRN, date of birth, ID band Patient awake    Reviewed: Allergy & Precautions, NPO status , Patient's Chart, lab work & pertinent test results  Airway Mallampati: II  TM Distance: >3 FB Neck ROM: Full    Dental  (+) Teeth Intact, Dental Advisory Given   Pulmonary Current Smoker and Patient abstained from smoking.   Pulmonary exam normal breath sounds clear to auscultation       Cardiovascular negative cardio ROS Normal cardiovascular exam Rhythm:Regular Rate:Normal     Neuro/Psych negative neurological ROS  negative psych ROS   GI/Hepatic negative GI ROS,,,(+) Hepatitis -, C  Endo/Other  negative endocrine ROS    Renal/GU negative Renal ROS  negative genitourinary   Musculoskeletal negative musculoskeletal ROS (+)    Abdominal   Peds  Hematology negative hematology ROS (+) Lab Results      Component                Value               Date                      WBC                      12.2 (H)            08/19/2022                HGB                      16.4                08/19/2022                HCT                      46.5                08/19/2022                MCV                      95.9                08/19/2022                PLT                      127 (L)             08/19/2022              Anesthesia Other Findings Patient is status post micturition related syncope from vasovagal episode.  Patient with severe pre-existing spinal stenosis with subsequent incomplete spinal cord injury. Currently with upper and lower extremity weakness L>R  Reproductive/Obstetrics                             Anesthesia Physical Anesthesia Plan  ASA: 2  Anesthesia Plan: General   Post-op Pain Management: Tylenol PO (pre-op)*   Induction: Intravenous and Rapid sequence  PONV Risk Score and Plan: 1 and Midazolam, Dexamethasone and  Ondansetron  Airway Management Planned: Oral ETT and Video Laryngoscope Planned  Additional Equipment:   Intra-op Plan:   Post-operative Plan: Extubation in OR  Informed Consent:  I have reviewed the patients History and Physical, chart, labs and discussed the procedure including the risks, benefits and alternatives for the proposed anesthesia with the patient or authorized representative who has indicated his/her understanding and acceptance.     Dental advisory given  Plan Discussed with: CRNA  Anesthesia Plan Comments:        Anesthesia Quick Evaluation

## 2022-08-21 NOTE — Plan of Care (Signed)

## 2022-08-21 NOTE — Plan of Care (Signed)
  Problem: Pain Managment: Goal: General experience of comfort will improve Outcome: Not Progressing   Problem: Safety: Goal: Ability to remain free from injury will improve Outcome: Not Progressing   

## 2022-08-21 NOTE — Transfer of Care (Signed)
Immediate Anesthesia Transfer of Care Note  Patient: Kenneth Flores  Procedure(s) Performed: ANTERIOR CERVICAL DECOMPRESSION/DISCECTOMY FUSION CERVICAL THREE-FOUR, CERVICAL FOUR-FIVE, CERVICAL FIVE-SIX (Spine Cervical)  Patient Location: PACU  Anesthesia Type:General  Level of Consciousness: awake, alert , oriented, and patient cooperative  Airway & Oxygen Therapy: Patient Spontanous Breathing and Patient connected to face mask oxygen  Post-op Assessment: Report given to RN and Post -op Vital signs reviewed and stable  Post vital signs: Reviewed and stable  Last Vitals:  Vitals Value Taken Time  BP 220/117 08/21/22 1619  Temp    Pulse 72 08/21/22 1623  Resp 10 08/21/22 1623  SpO2 96 % 08/21/22 1623  Vitals shown include unvalidated device data.  Last Pain:  Vitals:   08/21/22 1137  TempSrc: Oral  PainSc:       Patients Stated Pain Goal: 0 (08/20/22 2204)  Complications: No notable events documented.

## 2022-08-21 NOTE — Brief Op Note (Signed)
08/21/2022  4:03 PM  PATIENT:  Kenneth Flores  62 y.o. male  PRE-OPERATIVE DIAGNOSIS:  Cervical spinal cord injury  POST-OPERATIVE DIAGNOSIS:  Cervical spinal cord injury  PROCEDURE:  Procedure(s): ANTERIOR CERVICAL DECOMPRESSION/DISCECTOMY FUSION CERVICAL THREE-FOUR, CERVICAL FOUR-FIVE, CERVICAL FIVE-SIX (N/A)  SURGEON:  Surgeon(s) and Role:    * Julio Sicks, MD - Primary    * Coletta Memos, MD - Assisting  PHYSICIAN ASSISTANT:   ASSISTANTS:    ANESTHESIA:   general  EBL:  125 mL   BLOOD ADMINISTERED:none  DRAINS: (med) Hemovact drain(s) in the prevertebral space with  Suction Open   LOCAL MEDICATIONS USED:  NONE  SPECIMEN:  No Specimen  DISPOSITION OF SPECIMEN:  N/A  COUNTS:  YES  TOURNIQUET:  * No tourniquets in log *  DICTATION: .Dragon Dictation  PLAN OF CARE: Admit to inpatient   PATIENT DISPOSITION:  PACU - hemodynamically stable.   Delay start of Pharmacological VTE agent (>24hrs) due to surgical blood loss or risk of bleeding: yes

## 2022-08-21 NOTE — Progress Notes (Signed)
No new issues or problems.  Patient still with significant neuropathic pain in the both upper extremities.  He is afebrile.  His vital signs are stable.  Neurologically is unchanged with significant bilateral upper extremity weakness left worse than right and lower extremity weakness again left worse than right  Patient has no new questions or concerns regarding his upcoming surgery.  Plan three-level anterior cervical decompression and fusion for treatment of his severe stenosis with myelopathy.  Once again risks and benefits been explained.  Patient wishes to proceed.

## 2022-08-22 NOTE — Plan of Care (Signed)

## 2022-08-22 NOTE — Evaluation (Signed)
Physical Therapy Evaluation Patient Details Name: Kenneth Flores MRN: [id number] DOB: 1961/01/05 Today's Date: 08/22/2022  History of Present Illness  Pt is a 62 y.o. 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 Significant PMH: none.  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 Pt presents with decreased functional mobility secondary to weakness (particularly left sided, LUE weaker than RUE), impaired sensation, sitting and standing balance deficits, and pain. Pt requiring two person mod-max assist for bed mobility and transfers to standing. Able to perform pre gait training I.e. weight shifting and marching in place and then step pivot to chair with external support provided by therapists. Suspect excellent progress given age, PLOF, high levels of motivation and family support. Would highly benefit from intensive post acute rehabilitation to address deficits and maximize functional mobility.     Recommendations for follow up therapy are one component of a multi-disciplinary discharge planning process, led by the attending physician.  Recommendations may be updated based on patient status, additional functional criteria and insurance authorization.  Follow Up Recommendations       Assistance Recommended at Discharge Frequent or constant Supervision/Assistance  Patient can return home with the following  Two people to help with walking and/or transfers;A lot of help with bathing/dressing/bathroom;Assistance with feeding;Direct supervision/assist for medications management;Assist for transportation;Help with stairs or ramp for entrance    Equipment Recommendations Other (comment) (TBA)  Recommendations for Other Services  Rehab consult    Functional Status Assessment Patient has had a recent decline in their functional status and demonstrates the ability to make significant improvements in function in a reasonable  and predictable amount of time.     Precautions / Restrictions Precautions Precautions: Fall;Cervical Precaution Booklet Issued: No Required Braces or Orthoses: Cervical Brace Cervical Brace: Soft collar Restrictions Weight Bearing Restrictions: No      Mobility  Bed Mobility Overal bed mobility: Needs Assistance Bed Mobility: Rolling, Sidelying to Sit Rolling: Mod assist, +2 for physical assistance Sidelying to sit: Max assist, +2 for physical assistance       General bed mobility comments: Cues for log roll technique, assist for BLE's and trunk. Use of bed rail    Transfers Overall transfer level: Needs assistance Equipment used: None Transfers: Sit to/from Stand, Bed to chair/wheelchair/BSC Sit to Stand: Mod assist, +2 physical assistance Stand pivot transfers: Mod assist, +2 physical assistance         General transfer comment: Cues for "hooking," elbows with PT, face to face transfer with bilateral knee block. Emphasis on upright posture and glute activation. No knee buckle noted. Weight shifting and pivoting over to chair with increased time and cues for sequencing/technique    Ambulation/Gait                  Stairs            Wheelchair Mobility    Modified Rankin (Stroke Patients Only)       Balance Overall balance assessment: Needs assistance Sitting-balance support: Feet supported Sitting balance-Leahy Scale: Poor Sitting balance - Comments: 2-3 episodes of posterior LOB, requiring minA to correct   Standing balance support: Bilateral upper extremity supported Standing balance-Leahy Scale: Poor Standing balance comment: reliant on external support                             Pertinent Vitals/Pain Pain Assessment Pain Assessment: 0-10 Pain Score:  10-Worst pain ever Pain Location: Radicular pain to bilateral shoulders Pain Descriptors / Indicators: Grimacing, Operative site guarding, Radiating Pain Intervention(s):  Limited activity within patient's tolerance, Monitored during session, Premedicated before session    Home Living Family/patient expects to be discharged to:: Private residence Living Arrangements: Spouse/significant other Available Help at Discharge: Family Type of Home: House Home Access: Stairs to enter   Secretary/administrator of Steps: 1 Alternate Level Stairs-Number of Steps: 14 Home Layout: Two level Home Equipment: None Additional Comments: Reports 2 falls within past couple weeks; both syncopal episodes when using the bathroom in the middle of the night    Prior Function Prior Level of Function : Independent/Modified Independent             Mobility Comments: Works in Automotive engineer   Dominant Hand: Right    Extremity/Trunk Assessment   Upper Extremity Assessment Upper Extremity Assessment: Defer to OT evaluation    Lower Extremity Assessment Lower Extremity Assessment: RLE deficits/detail;LLE deficits/detail RLE Deficits / Details: Grossly 4+/5 RLE Sensation: decreased light touch LLE Deficits / Details: Grossly 3-/5, pt reports decreased sensation in comparison to RLE, but intact to light touch upon testing LLE Sensation: decreased light touch    Cervical / Trunk Assessment Cervical / Trunk Assessment: Neck Surgery  Communication   Communication: No difficulties  Cognition Arousal/Alertness: Awake/alert Behavior During Therapy: WFL for tasks assessed/performed Overall Cognitive Status: Within Functional Limits for tasks assessed                                          General Comments      Exercises     Assessment/Plan    PT Assessment Patient needs continued PT services  PT Problem List Decreased strength;Decreased activity tolerance;Decreased balance;Decreased mobility;Decreased coordination;Impaired sensation;Pain       PT Treatment Interventions DME instruction;Gait training;Stair training;Functional  mobility training;Therapeutic activities;Therapeutic exercise;Balance training;Patient/family education    PT Goals (Current goals can be found in the Care Plan section)  Acute Rehab PT Goals Patient Stated Goal: to walk PT Goal Formulation: With patient/family Time For Goal Achievement: 09/05/22 Potential to Achieve Goals: Good    Frequency Min 4X/week     Co-evaluation               AM-PAC PT "6 Clicks" Mobility  Outcome Measure Help needed turning from your back to your side while in a flat bed without using bedrails?: A Lot Help needed moving from lying on your back to sitting on the side of a flat bed without using bedrails?: Total Help needed moving to and from a bed to a chair (including a wheelchair)?: Total Help needed standing up from a chair using your arms (e.g., wheelchair or bedside chair)?: Total Help needed to walk in hospital room?: Total Help needed climbing 3-5 steps with a railing? : Total 6 Click Score: 7    End of Session Equipment Utilized During Treatment: Gait belt;Cervical collar Activity Tolerance: Patient tolerated treatment well Patient left: in chair;with call bell/phone within reach;with chair alarm set;with family/visitor present Nurse Communication: Mobility status PT Visit Diagnosis: Unsteadiness on feet (R26.81);History of falling (Z91.81);Difficulty in walking, not elsewhere classified (R26.2);Pain Pain - part of body:  (neck)    Time: 1610-9604 PT Time Calculation (min) (ACUTE ONLY): 35 min   Charges:   PT Evaluation $PT Eval Low Complexity: 1  Low PT Treatments $Therapeutic Activity: 8-22 mins        Lillia Pauls, PT, DPT Acute Rehabilitation Services Office [phone]   Kenneth Flores 08/22/2022, 1:39 PM

## 2022-08-22 NOTE — Plan of Care (Signed)
  Problem: Pain Managment: Goal: General experience of comfort will improve Outcome: Not Progressing   Problem: Pain Management: Goal: Pain level will decrease Outcome: Not Progressing   Problem: Skin Integrity: Goal: Will show signs of wound healing Outcome: Not Progressing

## 2022-08-22 NOTE — Anesthesia Postprocedure Evaluation (Signed)
Anesthesia Post Note  Patient: Kenneth Flores  Procedure(s) Performed: ANTERIOR CERVICAL DECOMPRESSION/DISCECTOMY FUSION CERVICAL THREE-FOUR, CERVICAL FOUR-FIVE, CERVICAL FIVE-SIX (Spine Cervical)     Patient location during evaluation: PACU Anesthesia Type: General Level of consciousness: awake and alert Pain management: pain level controlled Vital Signs Assessment: post-procedure vital signs reviewed and stable Respiratory status: spontaneous breathing, nonlabored ventilation, respiratory function stable and patient connected to nasal cannula oxygen Cardiovascular status: blood pressure returned to baseline and stable Postop Assessment: no apparent nausea or vomiting Anesthetic complications: no   No notable events documented.  Last Vitals:  Vitals:   08/22/22 0744 08/22/22 1222  BP: (!) 170/99 (!) 148/102  Pulse: (!) 52 (!) 57  Resp: 19 19  Temp: 37 C 37.1 C  SpO2: 100% 100%    Last Pain:  Vitals:   08/22/22 1222  TempSrc: Oral  PainSc:                  Kenneth Flores

## 2022-08-22 NOTE — Progress Notes (Signed)
Inpatient Rehab Admissions Coordinator:  Consult received. Await therapy evaluations and recommendations to help determine appropriate rehab venue.  Railyn House Graves Madden, MS, CCC-SLP Admissions Coordinator [phone]  

## 2022-08-22 NOTE — Progress Notes (Signed)
  NEUROSURGERY PROGRESS NOTE   No issues overnight. Pt with subjective improvement in LE strength, minimal neck pain, swallowing well.  EXAM:  BP (!) 170/99 (BP Location: Right Arm)   Pulse (!) 52   Temp 98.6 F (37 C) (Oral)   Resp 19   Ht 6' (1.829 m)   Wt 74.8 kg   SpO2 100%   BMI 22.38 kg/m   Awake, alert, oriented  Speech fluent, appropriate  CN grossly intact  LUE: 3/5 bicep, 2/5 tricep, 2/5 distal LLE: 3/5 PF/DF, 3/5 quad/IP RUE: 4-/5 bicep/tricep 3/5 distal RLE: 4-/5 PF/DF, 4-/5 quad/IP Hemovac in place, 250cc overnight  IMPRESSION:  62 y.o. 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

## 2022-08-23 LAB — CULTURE, BLOOD (ROUTINE X 2)
Culture: NO GROWTH
Special Requests: ADEQUATE

## 2022-08-23 NOTE — Plan of Care (Signed)

## 2022-08-23 NOTE — Progress Notes (Signed)
Inpatient Rehab Admissions:  Inpatient Rehab Consult received.  I met with patient at the bedside for rehabilitation assessment and to discuss goals and expectations of an inpatient rehab admission.  Discussed average length of say, insurance authorization requirement, discharge home after completion of CIR. Pt interested in pursuing CIR. Pt gave permission to contact wife [name]. Spoke with [name] on the telephone. She also acknowledged understanding of CIR goals and expectations. She is supportive of pt pursuing CIR. She confirmed that she and other family members will be able to provide 24/7 support for pt after discharge. Will continue to follow.  Signed: Wolfgang Phoenix, MS, CCC-SLP Admissions Coordinator [phone]

## 2022-08-23 NOTE — Evaluation (Signed)
Occupational Therapy Evaluation Patient Details Name: Kenneth Flores MRN: [id number] DOB: 1961-02-20 Today's Date: 08/23/2022   History of Present Illness Pt is a 62 y.o. 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 Significant PMH: none.   Clinical Impression   Patient is s/p ACDF C3-6 surgery (incomplete SCI) resulting in functional limitations due to the deficits listed below (see OT problem list). Pt prior to admission indep and working. Pt currently dependent with feeding and OT helping with AE access this session to maximize indep. Pt able to use voice commands to answer phone and to text on phone at this  time.  Patient will benefit from skilled OT acutely to increase independence and safety with ADLS to allow discharge Patient will benefit from intensive inpatient follow up therapy, >3 hours/day.  *next session focus on rolling with hooking, get a drop arm chair in room, ted hose thigh high could be beneficial for progression of patient        Recommendations for follow up therapy are one component of a multi-disciplinary discharge planning process, led by the attending physician.  Recommendations may be updated based on patient status, additional functional criteria and insurance authorization.   Assistance Recommended at Discharge Frequent or constant Supervision/Assistance  Patient can return home with the following Two people to help with bathing/dressing/bathroom    Functional Status Assessment  Patient has had a recent decline in their functional status and demonstrates the ability to make significant improvements in function in a reasonable and predictable amount of time.  Equipment Recommendations  Wheelchair (measurements OT);Wheelchair cushion (measurements OT);Hospital bed;Other (comment) (speciality chair)    Recommendations for Other Services Rehab consult     Precautions / Restrictions Precautions Precautions:  Fall;Cervical Required Braces or Orthoses: Cervical Brace Cervical Brace: Soft collar      Mobility Bed Mobility Overal bed mobility: Needs Assistance             General bed mobility comments: bed used to sit upright for self feeding and access phone on bedside table. Next session to focus on bed level hooking to faciliate rolls    Transfers                   General transfer comment: bed level at this time      Balance                                           ADL either performed or assessed with clinical judgement   ADL Overall ADL's : Needs assistance/impaired Eating/Feeding: Minimal assistance;Bed level;With adaptive utensils;With assist to don/doff brace/orthosis Eating/Feeding Details (indicate cue type and reason): pt was being fed prior to OT session. pt has a sectioned plate to act a form of plate guard and universal cuff with 100% of tray intact by patient. Pt needed hand over hand for grits this morning due to spillage and frustration                                   General ADL Comments: OT helping patient feed himself for first time this admission and good return. Mug provided and taped to table to allow for indep intake of fluids. pt provided soft call bell for increased indep with call system. pt could benefit from  sip and call bell for increased access if available. the arm of the sip and puff could help with placement of straw for drink. Ot setup voice control on Iphone to allow the patient to access phone this session. pt able to open home screen, provided a list of commands he can practice and practiced call/ text feature. Pt asking for facebook and advised this would be stylus or voice overlay feature at a layer time. pt agreeable     Vision Baseline Vision/History: 1 Wears glasses Ability to See in Adequate Light: 0 Adequate Patient Visual Report: No change from baseline       Perception     Praxis       Pertinent Vitals/Pain Pain Assessment Pain Assessment: Faces Faces Pain Scale: Hurts little more Pain Location: shoulder pain, tingling and numbness in bil arms Pain Descriptors / Indicators: Grimacing, Discomfort, Sore Pain Intervention(s): Monitored during session, Premedicated before session, Repositioned     Hand Dominance Right   Extremity/Trunk Assessment Upper Extremity Assessment Upper Extremity Assessment: RUE deficits/detail;LUE deficits/detail RUE Deficits / Details: shoulder shrug present, elbow flexion and extension, supination / pronation controlled. Wrist extension present with less than full range, pt able to extend digits with wrist in neutral position. Pt can pad to pag thumb 1st and 2nd digit. Pt has decreased grasp and unable to sustain red tube in hand at this time. RUE Sensation: decreased light touch;decreased proprioception RUE Coordination: decreased fine motor;decreased gross motor LUE Deficits / Details: shoulder shrug present, decreased elbow flexion and control of extension, no supination/ pronation present, unable to extend wrist fully but able to initiate. pt no activation of digits without wrist extension. L UE using a tenodesis grasp response to be functional LUE Sensation: decreased light touch;decreased proprioception LUE Coordination: decreased fine motor;decreased gross motor   Lower Extremity Assessment Lower Extremity Assessment: Defer to PT evaluation   Cervical / Trunk Assessment Cervical / Trunk Assessment: Neck Surgery   Communication Communication Communication: No difficulties   Cognition Arousal/Alertness: Awake/alert Behavior During Therapy: WFL for tasks assessed/performed Overall Cognitive Status: Within Functional Limits for tasks assessed                                       General Comments  pt reports feeling "Hot" and "sweating" . Ot asking about bowel movement and pt reports x2 days without void. Pt could  benefit from bowel/ bladder program. Pt with foley at this time and pt verbalized being able to tell when he is voiding but immediate urgency. Rn notified of symptoms and potential Autonomic dysreflexia concerns.    Exercises     Shoulder Instructions      Home Living Family/patient expects to be discharged to:: Private residence Living Arrangements: Spouse/significant other Available Help at Discharge: Family Type of Home: House Home Access: Stairs to enter Secretary/administrator of Steps: 1   Home Layout: Two level Alternate Level Stairs-Number of Steps: 14 Alternate Level Stairs-Rails: Right;Left Bathroom Shower/Tub: Producer, television/film/video: Standard     Home Equipment: None   Additional Comments: Reports 2 falls within past couple weeks; both syncopal episodes when using the bathroom in the middle of the night      Prior Functioning/Environment Prior Level of Function : Independent/Modified Independent             Mobility Comments: Works in Aeronautical engineer  OT Problem List: Decreased activity tolerance;Impaired balance (sitting and/or standing);Decreased safety awareness;Decreased knowledge of use of DME or AE;Decreased knowledge of precautions;Impaired sensation;Impaired tone;Impaired UE functional use      OT Treatment/Interventions: Self-care/ADL training;Therapeutic exercise;Neuromuscular education;Energy conservation;DME and/or AE instruction;Manual therapy;Modalities;Splinting;Therapeutic activities;Patient/family education;Balance training    OT Goals(Current goals can be found in the care plan section) Acute Rehab OT Goals Patient Stated Goal: to be able to access facebook on his phone OT Goal Formulation: With patient Time For Goal Achievement: 09/06/22 Potential to Achieve Goals: Good  OT Frequency: Min 3X/week    Co-evaluation              AM-PAC OT "6 Clicks" Daily Activity     Outcome Measure Help from another person eating  meals?: A Lot Help from another person taking care of personal grooming?: A Lot Help from another person toileting, which includes using toliet, bedpan, or urinal?: A Lot Help from another person bathing (including washing, rinsing, drying)?: A Lot Help from another person to put on and taking off regular upper body clothing?: A Lot Help from another person to put on and taking off regular lower body clothing?: A Lot 6 Click Score: 12   End of Session Nurse Communication: Mobility status;Precautions  Activity Tolerance: Patient tolerated treatment well Patient left: in bed;with call bell/phone within reach;with bed alarm set  OT Visit Diagnosis: Muscle weakness (generalized) (M62.81)                Time: 9147-8295 OT Time Calculation (min): 51 min Charges:  OT General Charges $OT Visit: 1 Visit OT Evaluation $OT Eval Moderate Complexity: 1 Mod OT Treatments $Self Care/Home Management : 23-37 mins   Brynn, OTR/L  Acute Rehabilitation Services Office: [phone] .   Mateo Flow 08/23/2022, 10:21 AM

## 2022-08-23 NOTE — Progress Notes (Signed)
Patient ID: Kenneth Flores, male   DOB: Dec 12, 1960, 62 y.o.   MRN: [id number] BP (!) 141/90 (BP Location: Right Arm)   Pulse (!) 54   Temp 97.9 F (36.6 C) (Oral)   Resp 18   Ht 6' (1.829 m)   Wt 74.8 kg   SpO2 96%   BMI 22.38 kg/m  Alert and oriented x 4 Wound is clean, dry, no signs of infection Continues with pain in the upper extremities and shoulders which is expected.  Voice is strong Doing well post op , hoping for CIR

## 2022-08-23 NOTE — Progress Notes (Signed)
Orthopedic Tech Progress Note Patient Details:  Kenneth Flores 1960/11/26 [id number]  Patient ID: Kenneth Flores, male   DOB: 26-Jun-1960, 62 y.o.   MRN: [id number] I spoke with the patients rn. They said they had the collar. Trinna Post 08/23/2022, 6:07 AM

## 2022-08-23 NOTE — PMR Pre-admission (Signed)
PMR Admission Coordinator Pre-Admission Assessment  Patient: Kenneth Flores is an 62 y.o., male MRN: [id number] DOB: 1960-08-06 Height: 6' (182.9 cm) Weight: 74.8 kg  Insurance Information HMO: ***    PPO: ***     PCP:      IPA:      80/20:      OTHER:  PRIMARY: [employer]      Policy#: [id number]      Subscriber: patient CM Name: ***      Phone#: ***     Fax#: *** Pre-Cert#: ***      Employer: *** Benefits:  Phone #: ***     Name: *** Eff. Date: ***     Deduct: ***      Out of Pocket Max: ***      Life Max: *** CIR: ***      SNF: *** Outpatient: ***     Co-Pay: *** Home Health: ***      Co-Pay: *** DME: ***     Co-Pay: *** Providers: in-network SECONDARY:       Policy#:      Phone#:   Financial Counselor:       Phone#:   The Data processing manager" for patients in Inpatient Rehabilitation Facilities with attached "Privacy Act Statement-Health Care Records" was provided and verbally reviewed with: N/A  Emergency Contact Information Contact Information     Name Relation Home Work Mobile   [place] Spouse [phone]  [phone]       Current Medical History  Patient Admitting Diagnosis: Cervical spinal cord injury, 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 Pt presented to [facility] on 08/19[facility]22 d/t syncopal episode while voiding. Reported he had neck pain and inability to move extremities. CT scan negative for acute fractures/dislocation. MRI showed anterior and posterior ligamentous injury with evidence of acute central cord contusion. Neurosurgery consulted. Noted pt with severe pre-existing spinal stenosis with subsequent incomplete spinal cord injury. Pt underwent C3-6 ACDF by Dr. Jordan Likes on 08/21/22. Therapy evaluations completed and CIR recommended d/t pt's deficits in functional mobility.     Patient's medical record from [facility] has been reviewed by the  rehabilitation admission coordinator and physician.  Past Medical History  Past Medical History:  Diagnosis Date   Hepatitis C     Has the patient had major surgery during 100 days prior to admission? Yes  Family History   family history includes Hepatitis C in his brother.  Current Medications  Current Facility-Administered Medications:    0.9 %  sodium chloride infusion, 250 mL, Intravenous, Continuous, Pool, Sherilyn Cooter, MD, Stopped at 08/22/22 1718   acetaminophen (TYLENOL) tablet 650 mg, 650 mg, Oral, Q4H PRN, 650 mg at 08/22/22 0658 **OR** acetaminophen (TYLENOL) suppository 650 mg, 650 mg, Rectal, Q4H PRN, Julio Sicks, MD   cyclobenzaprine (FLEXERIL) tablet 10 mg, 10 mg, Oral, TID PRN, Julio Sicks, MD, 10 mg at 08/23/22 0038   dexamethasone (DECADRON) injection 4 mg, 4 mg, Intravenous, Q6H, 4 mg at 08/23/22 1116 **OR** dexamethasone (DECADRON) tablet 4 mg, 4 mg, Oral, Q6H, Pool, Henry, MD, 4 mg at 08/22/22 0506   docusate sodium (COLACE) capsule 100 mg, 100 mg, Oral, BID, Pool, Sherilyn Cooter, MD, 100 mg at 08/23/22 0838   gabapentin (NEURONTIN) capsule 300 mg, 300 mg, Oral, TID, Julio Sicks, MD, 300 mg at 08/23/22 0838   HYDROcodone-acetaminophen (NORCO) 10-325 MG per tablet 2 tablet, 2 tablet, Oral, Q4H PRN,  Julio Sicks, MD, 2 tablet at 08/23/22 1109   HYDROcodone-acetaminophen (NORCO/VICODIN) 5-325 MG per tablet 1 tablet, 1 tablet, Oral, Q4H PRN, Julio Sicks, MD   HYDROmorphone (DILAUDID) injection 1 mg, 1 mg, Intravenous, Q2H PRN, Julio Sicks, MD, 1 mg at 08/23/22 1110   menthol-cetylpyridinium (CEPACOL) lozenge 3 mg, 1 lozenge, Oral, PRN, 3 mg at 08/21/22 2200 **OR** phenol (CHLORASEPTIC) mouth spray 1 spray, 1 spray, Mouth/Throat, PRN, Julio Sicks, MD   ondansetron (ZOFRAN) tablet 4 mg, 4 mg, Oral, Q6H PRN **OR** ondansetron (ZOFRAN) injection 4 mg, 4 mg, Intravenous, Q6H PRN, Pool, Sherilyn Cooter, MD   polyethylene glycol (MIRALAX / GLYCOLAX) packet 17 g, 17 g, Oral, Daily PRN, Julio Sicks, MD    sodium chloride flush (NS) 0.9 % injection 3 mL, 3 mL, Intravenous, Q12H, Pool, Sherilyn Cooter, MD, 3 mL at 08/23/22 0838   sodium chloride flush (NS) 0.9 % injection 3 mL, 3 mL, Intravenous, PRN, Julio Sicks, MD  Patients Current Diet:  Diet Order             Diet regular Room service appropriate? Yes; Fluid consistency: Thin  Diet effective now                   Precautions / Restrictions Precautions Precautions: Fall, Cervical Precaution Booklet Issued: No Cervical Brace: Soft collar Restrictions Weight Bearing Restrictions: No   Has the patient had 2 or more falls or a fall with injury in the past year? Yes  Prior Activity Level Community (5-7x/wk): works, drives, gets out of house often  Prior Functional Level Self Care: Did the patient need help bathing, dressing, using the toilet or eating? Independent  Indoor Mobility: Did the patient need assistance with walking from room to room (with or without device)? Independent  Stairs: Did the patient need assistance with internal or external stairs (with or without device)? Independent  Functional Cognition: Did the patient need help planning regular tasks such as shopping or remembering to take medications? Independent  Patient Information Are you of Hispanic, Latino/a,or Spanish origin?: A. No, not of Hispanic, Latino/a, or Spanish origin What is your race?: B. Black or African American Do you need or want an interpreter to communicate with a doctor or health care staff?: 0. No  Patient's Response To:  Health Literacy and Transportation Is the patient able to respond to health literacy and transportation needs?: Yes Health Literacy - How often do you need to have someone help you when you read instructions, pamphlets, or other written material from your doctor or pharmacy?: Never In the past 12 months, has lack of transportation kept you from medical appointments or from getting medications?: No In the past 12 months, has  lack of transportation kept you from meetings, work, or from getting things needed for daily living?: No  Journalist, newspaper / Equipment Home Assistive Devices/Equipment: None Home Equipment: None  Prior Device Use: Indicate devices/aids used by the patient prior to current illness, exacerbation or injury? None of the above  Current Functional Level Cognition  Overall Cognitive Status: Within Functional Limits for tasks assessed Orientation Level: Oriented X4    Extremity Assessment (includes Sensation/Coordination)  Upper Extremity Assessment: RUE deficits/detail, LUE deficits/detail RUE Deficits / Details: shoulder shrug present, elbow flexion and extension, supination / pronation controlled. Wrist extension present with less than full range, pt able to extend digits with wrist in neutral position. Pt can pad to pag thumb 1st and 2nd digit. Pt has decreased grasp and unable to sustain red tube  in hand at this time. RUE Sensation: decreased light touch, decreased proprioception RUE Coordination: decreased fine motor, decreased gross motor LUE Deficits / Details: shoulder shrug present, decreased elbow flexion and control of extension, no supination/ pronation present, unable to extend wrist fully but able to initiate. pt no activation of digits without wrist extension. L UE using a tenodesis grasp response to be functional LUE Sensation: decreased light touch, decreased proprioception LUE Coordination: decreased fine motor, decreased gross motor  Lower Extremity Assessment: Defer to PT evaluation RLE Deficits / Details: Grossly 4+/5 RLE Sensation: decreased light touch LLE Deficits / Details: Grossly 3-/5, pt reports decreased sensation in comparison to RLE, but intact to light touch upon testing LLE Sensation: decreased light touch    ADLs  Overall ADL's : Needs assistance/impaired Eating/Feeding: Minimal assistance, Bed level, With adaptive utensils, With assist to don/doff  brace/orthosis Eating/Feeding Details (indicate cue type and reason): pt was being fed prior to OT session. pt has a sectioned plate to act a form of plate guard and universal cuff with 100% of tray intact by patient. Pt needed hand over hand for grits this morning due to spillage and frustration General ADL Comments: OT helping patient feed himself for first time this admission and good return. Mug provided and taped to table to allow for indep intake of fluids. pt provided soft call bell for increased indep with call system. pt could benefit from sip and call bell for increased access if available. the arm of the sip and puff could help with placement of straw for drink. Ot setup voice control on Iphone to allow the patient to access phone this session. pt able to open home screen, provided a list of commands he can practice and practiced call/ text feature. Pt asking for facebook and advised this would be stylus or voice overlay feature at a layer time. pt agreeable    Mobility  Overal bed mobility: Needs Assistance Bed Mobility: Rolling, Sidelying to Sit Rolling: Mod assist, +2 for physical assistance Sidelying to sit: Max assist, +2 for physical assistance General bed mobility comments: bed used to sit upright for self feeding and access phone on bedside table. Next session to focus on bed level hooking to faciliate rolls    Transfers  Overall transfer level: Needs assistance Equipment used: None Transfers: Sit to/from Stand, Bed to chair/wheelchair/BSC Sit to Stand: Mod assist, +2 physical assistance Bed to/from chair/wheelchair/BSC transfer type:: Stand pivot Stand pivot transfers: Mod assist, +2 physical assistance General transfer comment: bed level at this time    Ambulation / Gait / Stairs / Engineer, drilling / Balance Dynamic Sitting Balance Sitting balance - Comments: 2-3 episodes of posterior LOB, requiring minA to correct Balance Overall balance assessment:  Needs assistance Sitting-balance support: Feet supported Sitting balance-Leahy Scale: Poor Sitting balance - Comments: 2-3 episodes of posterior LOB, requiring minA to correct Standing balance support: Bilateral upper extremity supported Standing balance-Leahy Scale: Poor Standing balance comment: reliant on external support    Special needs/care consideration Continuous Drip IV  0.9% sodium chloride infusion, External urinary catheter, Wound Vac Hemovac: neck/anterior, and Skin Surgical incision: ned   Previous Home Environment (from acute therapy documentation) Living Arrangements: Spouse/significant other, Children (stepson)  Lives With: Spouse, Son Available Help at Discharge: Family, Available 24 hours/day Type of Home: House Home Layout: Two level, 1/2 bath on main level Alternate Level Stairs-Rails: Can reach both Alternate Level Stairs-Number of Steps: 10-12 Home Access: Level entry Entrance  Stairs-Number of Steps: 1 Bathroom Shower/Tub: Associate Professor: Yes How Accessible: Accessible via walker Home Care Services: No Additional Comments: Reports 2 falls within past couple weeks; both syncopal episodes when using the bathroom in the middle of the night  Discharge Living Setting Plans for Discharge Living Setting: Patient's home Type of Home at Discharge: House Discharge Home Layout: Two level, 1/2 bath on main level Alternate Level Stairs-Rails: Can reach both Alternate Level Stairs-Number of Steps: 10-12 Discharge Home Access: Level entry Discharge Bathroom Shower/Tub: Tub/shower unit Discharge Bathroom Toilet: Standard Discharge Bathroom Accessibility: Yes How Accessible: Accessible via walker Does the patient have any problems obtaining your medications?: No  Social/Family/Support Systems Anticipated Caregiver: Hulan Saas (wife) and other family Anticipated Caregiver's Contact Information: [name]:  [phone] Caregiver Availability: 24/7 Discharge Plan Discussed with Primary Caregiver: Yes Is Caregiver In Agreement with Plan?: Yes Does Caregiver/Family have Issues with Lodging/Transportation while Pt is in Rehab?: No  Goals Patient/Family Goal for Rehab: *** Expected length of stay: *** Pt/Family Agrees to Admission and willing to participate: Yes Program Orientation Provided & Reviewed with Pt/Caregiver Including Roles  & Responsibilities: Yes  Decrease burden of Care through IP rehab admission: NA  Possible need for SNF placement upon discharge: Not anticipated  Patient Condition: I have reviewed medical records from [facility], spoken with CM, and patient and spouse. I met with patient at the bedside and discussed via phone for inpatient rehabilitation assessment.  Patient will benefit from ongoing PT and OT, can actively participate in 3 hours of therapy a day 5 days of the week, and can make measurable gains during the admission.  Patient will also benefit from the coordinated team approach during an Inpatient Acute Rehabilitation admission.  The patient will receive intensive therapy as well as Rehabilitation physician, nursing, social worker, and care management interventions.  Due to bladder management, safety, skin/wound care, disease management, medication administration, pain management, and patient education the patient requires 24 hour a day rehabilitation nursing.  The patient is currently *** with mobility and basic ADLs.  Discharge setting and therapy post discharge at home with home health is anticipated.  Patient has agreed to participate in the Acute Inpatient Rehabilitation Program and will admit {Time; today/tomorrow:10263}.  Preadmission Screen Completed By:  Domingo Pulse, 08/23/2022 1:05 PM ______________________________________________________________________   Discussed status with Dr. Marland Kitchen on *** at *** and received approval for admission  today.  Admission Coordinator:  Domingo Pulse, CCC-SLP, time ***/Date ***   Assessment/Plan: Diagnosis: Does the need for close, 24 hr/day Medical supervision in concert with the patient's rehab needs make it unreasonable for this patient to be served in a less intensive setting? {yes_no_potentially:3041433} Co-Morbidities requiring supervision/potential complications: *** Due to {due UJ:8119147}, does the patient require 24 hr/day rehab nursing? {yes_no_potentially:3041433} Does the patient require coordinated care of a physician, rehab nurse, PT, OT, and SLP to address physical and functional deficits in the context of the above medical diagnosis(es)? {yes_no_potentially:3041433} Addressing deficits in the following areas: {deficits:3041436} Can the patient actively participate in an intensive therapy program of at least 3 hrs of therapy 5 days a week? {yes_no_potentially:3041433} The potential for patient to make measurable gains while on inpatient rehab is {potential:3041437} Anticipated functional outcomes upon discharge from inpatient rehab: {functional outcomes:304600100} PT, {functional outcomes:304600100} OT, {functional outcomes:304600100} SLP Estimated rehab length of stay to reach the above functional goals is: *** Anticipated discharge destination: {anticipated dc setting:21604} 10. Overall Rehab/Functional Prognosis: {potential:3041437}  MD Signature: ***

## 2022-08-24 ENCOUNTER — Other Ambulatory Visit: Payer: Self-pay

## 2022-08-24 ENCOUNTER — Encounter (HOSPITAL_COMMUNITY): Payer: Self-pay | Admitting: Neurosurgery

## 2022-08-24 LAB — CULTURE, BLOOD (ROUTINE X 2): Culture: NO GROWTH

## 2022-08-24 NOTE — Progress Notes (Signed)
Physical Therapy Treatment Patient Details Name: Kenneth Flores MRN: [id number] DOB: Jun 28, 1960 Today's Date: 08/24/2022   History of Present Illness Pt is a 62 y.o. 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 Significant PMH: none.    PT Comments    Pt making excellent progress towards his physical therapy goals. Continues to report radicular bilateral shoulder pain (L more painful than R), however, demonstrates high motivation and eagerness to participate in therapy session. Focus of session on BLE strengthening, static balance, trunk/core activation and functional strengthening and mobility. Kenneth Flores for pre transfer training and pt performed truncal rotation activities and serial sit to stands with two person min-mod assist. Will continue to benefit from intensive post acute rehabilitation to address deficits and maximize functional mobility.   Recommendations for follow up therapy are one component of a multi-disciplinary discharge planning process, led by the attending physician.  Recommendations may be updated based on patient status, additional functional criteria and insurance authorization.  Follow Up Recommendations       Assistance Recommended at Discharge Frequent or constant Supervision/Assistance  Patient can return home with the following Two people to help with walking and/or transfers;A lot of help with bathing/dressing/bathroom;Assistance with feeding;Direct supervision/assist for medications management;Assist for transportation;Help with stairs or ramp for entrance   Equipment Recommendations  Other (comment) (TBA)    Recommendations for Other Services Rehab consult     Precautions / Restrictions Precautions Precautions: Fall;Cervical Precaution Comments: MD states verbally in room that collar can be off supine. pt should have soft collar for the first week working oob Required Braces or Orthoses: Cervical  Brace Cervical Brace: Soft collar Restrictions Weight Bearing Restrictions: No     Mobility  Bed Mobility Overal bed mobility: Needs Assistance Bed Mobility: Supine to Sit, Sit to Supine     Supine to sit: Min assist Sit to supine: Min guard   General bed mobility comments: Pt with improvement in initiation of progressing BLE's off edge of bed. Use of bed rail to hook RUE to facilitate transferring over towards left side of bed.    Transfers Overall transfer level: Needs assistance Equipment used: Ambulation equipment used Transfers: Sit to/from Stand Sit to Stand: +2 physical assistance, Min assist, Mod assist           General transfer comment: Pt requiring modA + 2 to stand from edge of bed up to [facility], bilateral hands guided and placed on bar, progressing from elevated Stedy flaps to minA. Cues for glute activation and posterior weight shift    Ambulation/Gait                   Stairs             Wheelchair Mobility    Modified Rankin (Stroke Patients Only)       Balance Overall balance assessment: Needs assistance Sitting-balance support: Feet supported Sitting balance-Leahy Scale: Fair                                      Cognition Arousal/Alertness: Awake/alert Behavior During Therapy: WFL for tasks assessed/performed Overall Cognitive Status: Within Functional Limits for tasks assessed  Exercises General Exercises - Lower Extremity Long Arc Quad: Both, 10 reps, Seated Other Exercises Other Exercises: Standing in Stedy: trunk rotation and functional reaching with BUE's Other Exercises: x10 serial sit to stands in [place]    General Comments        Pertinent Vitals/Pain Pain Assessment Pain Assessment: Faces Faces Pain Scale: Hurts little more Pain Location: shoulders / L side mainly Pain Descriptors / Indicators: Sore Pain Intervention(s): Monitored  during session, Repositioned    Home Living                          Prior Function            PT Goals (current goals can now be found in the care plan section) Acute Rehab PT Goals Potential to Achieve Goals: Good Progress towards PT goals: Progressing toward goals    Frequency    Min 4X/week      PT Plan Current plan remains appropriate    Co-evaluation              AM-PAC PT "6 Clicks" Mobility   Outcome Measure  Help needed turning from your back to your side while in a flat bed without using bedrails?: A Little Help needed moving from lying on your back to sitting on the side of a flat bed without using bedrails?: A Little Help needed moving to and from a bed to a chair (including a wheelchair)?: A Lot Help needed standing up from a chair using your arms (e.g., wheelchair or bedside chair)?: A Lot Help needed to walk in hospital room?: Total Help needed climbing 3-5 steps with a railing? : Total 6 Click Score: 12    End of Session Equipment Utilized During Treatment: Gait belt;Cervical collar Activity Tolerance: Patient tolerated treatment well Patient left: in bed;with call bell/phone within reach;with bed alarm set;with family/visitor present Nurse Communication: Mobility status PT Visit Diagnosis: Unsteadiness on feet (R26.81);History of falling (Z91.81);Difficulty in walking, not elsewhere classified (R26.2);Pain     Time: 1424-1450 PT Time Calculation (min) (ACUTE ONLY): 26 min  Charges:  $Therapeutic Activity: 23-37 mins                     Kenneth Flores, PT, DPT Acute Rehabilitation Services Office [phone]    Kenneth Flores 08/24/2022, 3:06 PM

## 2022-08-24 NOTE — TOC Progression Note (Signed)
Transition of Care [facility]) - Progression Note    Patient Details  Name: Kenneth Flores MRN: [id number] Date of Birth: 1961/01/25  [facility] CM/SW Contact  Ronny Bacon, RN Phone Number: 08/24/2022, 3:27 PM  Clinical Narrative:  Chart reviewed. CIR is following patient awaiting medical clearance and bed to become available as of 08/24/2022. TOC will continue to follow for any TOC needs upon discharge.    Expected Discharge Plan: IP Rehab Facility Barriers to Discharge: Continued Medical Work up  Expected Discharge Plan and Services       Living arrangements for the past 2 months: Single Family Home                                       Social Determinants of Health (SDOH) Interventions SDOH Screenings   Food Insecurity: No Food Insecurity (08/19/2022)  Housing: Low Risk  (08/19/2022)  Transportation Needs: No Transportation Needs (08/19/2022)  Utilities: Not At Risk (08/19/2022)  Depression (PHQ2-9): Low Risk  (03/30/2022)  Tobacco Use: High Risk (08/21/2022)    Readmission Risk Interventions     No data to display

## 2022-08-24 NOTE — Progress Notes (Signed)
Postop day 3.  Overall doing reasonably well.  Minimal neck pain.  Swallowing well.  Voice strong.  Still with some dysesthetic pain in both shoulders bilaterally but improved from preop.  He is afebrile.  Vital signs are stable.  He is awake and alert.  He is oriented and appropriate.  Motor examination reveals good deltoid strength bilaterally.  Left slightly worse than right.  Bicep strength good bilaterally.  Good wrist extensor and triceps function on the right significantly improved from preop.  Grip and intrinsic on the right also significantly improved from preop grading out at 4 to 4+/5.  Left triceps strength 2-3/5 which is significantly better from preop.  Left wrist extensor 2/5 which is a little better from preop.  Minimal grip and no intrinsics on the left side.  Left lower extremity strong 3/5 possibly 4-/5 significantly better from preop.  Right lower extremity 4+/5 with some increased tone also better than preop.  Wound clean and dry.  Chest and abdomen benign.  Status post fall with incomplete spinal cord injury.  Patient status post three-level anterior cervical decompression and fusion.  Making good progress and significantly improved neurologically from presentation.  Continue rehab efforts.  Hopefully will be able to be transferred to the inpatient rehab service over the next few days.

## 2022-08-24 NOTE — Progress Notes (Signed)
Occupational Therapy Treatment Patient Details Name: Kenneth Flores MRN: [id number] DOB: 02/05/1961 Today's Date: 08/24/2022   History of present illness Pt is a 62 y.o. 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 Significant PMH: none.   OT comments  Pt demonstrates good return demo for hooked arm bed mobility this session and static prop sitting. Pt with R UE progressing with return demo faster than L UE. Pt with sensory intact and decreased proprioception to bil UE. Pt with pain better controlled this session however with some drowsiness noted. Pt reports no bowel movement since admission and no deficits peeing. Recommendation remains Patient will benefit from intensive inpatient follow up therapy, >3 hours/day.   Recommendations for follow up therapy are one component of a multi-disciplinary discharge planning process, led by the attending physician.  Recommendations may be updated based on patient status, additional functional criteria and insurance authorization.    Assistance Recommended at Discharge Frequent or constant Supervision/Assistance  Patient can return home with the following  Two people to help with bathing/dressing/bathroom   Equipment Recommendations  Wheelchair (measurements OT);Wheelchair cushion (measurements OT);Hospital bed;Other (comment)    Recommendations for Other Services Rehab consult    Precautions / Restrictions Precautions Precautions: Fall;Cervical Precaution Comments: MD states verbally in room that collar can be off supine. pt should have soft collar for the first week working oob Required Braces or Orthoses: Cervical Brace Cervical Brace: Soft collar Restrictions Weight Bearing Restrictions: No       Mobility Bed Mobility Overal bed mobility: Needs Assistance Bed Mobility: Rolling, Supine to Sit, Sit to Supine Rolling: Mod assist   Supine to sit: Mod assist Sit to supine: Mod assist    General bed mobility comments: pt hooking with R UE easier than L UE. pt able to use bed rail when rolling L side with R UE. Pt needs to utilize therapist with pad assist to roll toward R side with L UE. pt progressed bil LE toward EOB and needs (A) to elevate trunk from HOB 45 degrees. pt prop sitting with verbal cues for safe hand and wrist placement. pt sitting min guard (A). pt with SCD on to help with pressure support at this time. pt denies andy dizziness. pt needs (A) with bil LE to bring to bed surface.    Transfers Overall transfer level: Needs assistance   Transfers: Sit to/from Stand Sit to Stand: Min assist, From elevated surface           General transfer comment: face to face transfer. pt side stepping toward R side to move up in bed. pt with good return demo     Balance Overall balance assessment: Needs assistance Sitting-balance support: Bilateral upper extremity supported, Feet supported Sitting balance-Leahy Scale: Fair     Standing balance support: Bilateral upper extremity supported, During functional activity, Reliant on assistive device for balance Standing balance-Leahy Scale: Poor                             ADL either performed or assessed with clinical judgement   ADL Overall ADL's : Needs assistance/impaired   Eating/Feeding Details (indicate cue type and reason): wife reports feeding him last night and this morning.                                   General ADL Comments:  wife reports I was excited when he called me. Pt has made one known phone call with voice control    Extremity/Trunk Assessment Upper Extremity Assessment RUE Deficits / Details: deficits remain same :shoulder shrug present, elbow flexion and extension, supination / pronation controlled. Wrist extension present with less than full range, pt able to extend digits with wrist in neutral position. Pt can pad to pag thumb 1st and 2nd digit. Pt has decreased grasp  and unable to sustain red tube in hand at this time. RUE Sensation: decreased proprioception RUE Coordination: decreased gross motor;decreased fine motor LUE Deficits / Details: no digit activation, weak trace wrist extension present. pt elbow flexion and extension present. LUE Sensation: decreased proprioception LUE Coordination: decreased fine motor;decreased gross motor   Lower Extremity Assessment Lower Extremity Assessment: Defer to PT evaluation        Vision       Perception     Praxis      Cognition Arousal/Alertness: Lethargic, Suspect due to medications Behavior During Therapy: Flat affect Overall Cognitive Status: Within Functional Limits for tasks assessed                                          Exercises Exercises: Other exercises Other Exercises Other Exercises: quad sets sitting eob x10 reps, glute squeezes x10 reps sitting Other Exercises: activation of BIL UE in functional task for AROM and AAROM    Shoulder Instructions       General Comments skin checked without any break down and educated on pressure relief. pt positioned with pillows on R side in side lying. wife present for education    Pertinent Vitals/ Pain       Pain Assessment Pain Assessment: Faces Faces Pain Scale: Hurts a little bit Pain Location: shoulders / L side mainly Pain Descriptors / Indicators: Sore Pain Intervention(s): Other (comment) (RN gave IV medication directly at the start of OT session)  Home Living                                          Prior Functioning/Environment              Frequency  Min 3X/week        Progress Toward Goals  OT Goals(current goals can now be found in the care plan section)  Progress towards OT goals: Progressing toward goals  Acute Rehab OT Goals Patient Stated Goal: to go to inpatient rehab OT Goal Formulation: With patient Time For Goal Achievement: 09/06/22 Potential to Achieve Goals:  Good ADL Goals Pt Will Perform Eating: with adaptive utensils;sitting;with assist to don/doff brace/orthosis;with min guard assist Additional ADL Goal #1: pt will complete rolling with hooking on bed rail min (A) R and L Additional ADL Goal #2: pt will tolerate eob prop sitting min (A) as precursor to adls Additional ADL Goal #3: pt will complete a phone call using voice commands 100% indep  Plan Discharge plan remains appropriate    Co-evaluation                 AM-PAC OT "6 Clicks" Daily Activity     Outcome Measure   Help from another person eating meals?: A Lot Help from another person taking care of personal grooming?: A Lot Help from another person  toileting, which includes using toliet, bedpan, or urinal?: A Lot Help from another person bathing (including washing, rinsing, drying)?: A Lot Help from another person to put on and taking off regular upper body clothing?: A Lot Help from another person to put on and taking off regular lower body clothing?: A Lot 6 Click Score: 12    End of Session Equipment Utilized During Treatment: Gait belt  OT Visit Diagnosis: Muscle weakness (generalized) (M62.81)   Activity Tolerance Patient tolerated treatment well   Patient Left in bed;with call bell/phone within reach;with bed alarm set;with family/visitor present;with SCD's reapplied   Nurse Communication Mobility status;Precautions        Time: 4098-1191 OT Time Calculation (min): 36 min  Charges: OT General Charges $OT Visit: 1 Visit OT Treatments $Self Care/Home Management : 8-22 mins $Therapeutic Exercise: 8-22 mins   Brynn, OTR/L  Acute Rehabilitation Services Office: [phone] .   Mateo Flow 08/24/2022, 11:30 AM

## 2022-08-24 NOTE — Progress Notes (Signed)
Inpatient Rehab Admissions Coordinator:  Saw pt at bedside. Informed him that will begin insurance authorization. [id number]: Received insurance authorization. Await medical readiness and bed availability.   Wolfgang Phoenix, MS, CCC-SLP Admissions Coordinator [phone]

## 2022-08-25 NOTE — Progress Notes (Signed)
Inpatient Rehabilitation Admissions Coordinator   I have received insurance approval for CIR but no bed available today. I am hopeful for the next 24 to 48 hrs. I met with patient at bedside and he is aware.  Ottie Glazier, RN, MSN Rehab Admissions Coordinator [phone] 08/25/2022 12:29 PM

## 2022-08-25 NOTE — Plan of Care (Signed)
  Problem: Health Behavior/Discharge Planning: Goal: Ability to manage health-related needs will improve Outcome: Progressing   

## 2022-08-25 NOTE — Progress Notes (Signed)
No new issues or problems overnight.  Patient's pain controlled although he still has some dysesthetic sensation in both upper extremities.  He is afebrile.  His wound is healing well.  His neurologic exam is stable.  Progressing reasonably well following multilevel anterior cervical decompression and fusion surgery for treatment of his incomplete spinal cord injury.  Patient is medically stable for inpatient rehabilitation whenever bed is available.

## 2022-08-25 NOTE — Progress Notes (Addendum)
Physical Therapy Treatment Patient Details Name: Kenneth Flores MRN: [id number] DOB: 03-02-1961 Today's Date: 08/25/2022   History of Present Illness Pt is a 62 y.o. 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 Significant PMH: none.    PT Comments    Pt making excellent progress towards his physical therapy goals. Continues to demonstrate high levels of motivation despite BUE radicular pain (premedicated prior to session) and steady progress, requiring less assist for bed mobility and transfers this session. Focus of session on transfer and pre gait training. Pt able to perform multidirectional stepping with external support from therapists. Displays good activity throughout. Would benefit from trialing bilateral platform RW for further gait attempts. Patient will benefit from intensive inpatient follow up therapy, >3 hours/day.    Recommendations for follow up therapy are one component of a multi-disciplinary discharge planning process, led by the attending physician.  Recommendations may be updated based on patient status, additional functional criteria and insurance authorization.  Follow Up Recommendations       Assistance Recommended at Discharge Frequent or constant Supervision/Assistance  Patient can return home with the following Two people to help with walking and/or transfers;A lot of help with bathing/dressing/bathroom;Assistance with feeding;Direct supervision/assist for medications management;Assist for transportation;Help with stairs or ramp for entrance   Equipment Recommendations  Other (comment) (defer)    Recommendations for Other Services Rehab consult     Precautions / Restrictions Precautions Precautions: Fall;Cervical Precaution Comments: MD states verbally in room that collar can be off supine. pt should have soft collar for the first week working oob Required Braces or Orthoses: Cervical Brace Cervical Brace: Soft  collar Restrictions Weight Bearing Restrictions: No     Mobility  Bed Mobility Overal bed mobility: Needs Assistance Bed Mobility: Supine to Sit, Sit to Supine, Sit to Sidelying     Supine to sit: Min assist     General bed mobility comments: HOB flat for increased challenge, pt progressing to long sitting. Increased time for reciprocal scooting. Decreased functional use of arms to assist    Transfers Overall transfer level: Needs assistance Equipment used: 2 person hand held assist Transfers: Sit to/from Stand, Bed to chair/wheelchair/BSC Sit to Stand: +2 physical assistance, Min assist Stand pivot transfers: Min assist, +2 physical assistance         General transfer comment: MinA + 2 to rise multiple times from edge of bed with bilateral knee block, pt "hooking" bilateral elbows with therapist, good power up to stand. Taking multidirectional steps, to R/L, forwards/backwards and ultimately step pivot over to chair. Multimodal cues for weight shifting, truncal activation and posture, step length/width (visual cue of reaching for therapist foot).    Ambulation/Gait                   Stairs             Wheelchair Mobility    Modified Rankin (Stroke Patients Only)       Balance Overall balance assessment: Needs assistance Sitting-balance support: Feet supported Sitting balance-Leahy Scale: Fair     Standing balance support: Bilateral upper extremity supported, During functional activity, Reliant on assistive device for balance Standing balance-Leahy Scale: Poor Standing balance comment: reliant on external support                            Cognition Arousal/Alertness: Awake/alert Behavior During Therapy: WFL for tasks assessed/performed Overall Cognitive Status: Within  Functional Limits for tasks assessed                                          Exercises      General Comments        Pertinent Vitals/Pain  Pain Assessment Pain Assessment: Faces Faces Pain Scale: Hurts even more Pain Location: shoulders / L side mainly Pain Descriptors / Indicators: Sore, Radiating Pain Intervention(s): Limited activity within patient's tolerance, Monitored during session, Premedicated before session, Repositioned    Home Living                          Prior Function            PT Goals (current goals can now be found in the care plan section) Acute Rehab PT Goals Potential to Achieve Goals: Good Progress towards PT goals: Progressing toward goals    Frequency    Min 4X/week      PT Plan Current plan remains appropriate    Co-evaluation              AM-PAC PT "6 Clicks" Mobility   Outcome Measure  Help needed turning from your back to your side while in a flat bed without using bedrails?: A Little Help needed moving from lying on your back to sitting on the side of a flat bed without using bedrails?: A Little Help needed moving to and from a bed to a chair (including a wheelchair)?: A Little Help needed standing up from a chair using your arms (e.g., wheelchair or bedside chair)?: A Little Help needed to walk in hospital room?: Total Help needed climbing 3-5 steps with a railing? : Total 6 Click Score: 14    End of Session Equipment Utilized During Treatment: Gait belt;Cervical collar Activity Tolerance: Patient tolerated treatment well Patient left: with call bell/phone within reach;with chair alarm set;in chair;with nursing/sitter in room Nurse Communication: Mobility status PT Visit Diagnosis: Unsteadiness on feet (R26.81);History of falling (Z91.81);Difficulty in walking, not elsewhere classified (R26.2);Pain     Time: 1610-9604 PT Time Calculation (min) (ACUTE ONLY): 27 min  Charges:  $Therapeutic Activity: 23-37 mins                     Lillia Pauls, PT, DPT Acute Rehabilitation Services Office [phone]    CRISTIN THAKER 08/25/2022, 8:52  AM

## 2022-08-25 NOTE — Plan of Care (Signed)

## 2022-08-26 NOTE — Progress Notes (Signed)
Physical Therapy Treatment Patient Details Name: Kenneth Flores MRN: [id number] DOB: 03/23/1961 Today's Date: 08/26/2022   History of Present Illness Pt is a 62 y.o. 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 Significant PMH: none.    PT Comments    Pt w min A for bed mobility only to reposition L hip forward towards EOB. Pt with 2 person min A HHA "elbow hook" transfers to stand to the [employer]. Pt amb 25 feet out into the hallway with min A x2 person with chair follow, demos improved hip flexion and upright posture throughout. Pt transfers to [facility] post amb for BM and w x2 person min A "elbow hook" step pivot to return to bed. Pt continues to show excellent progression with mobility and will benefit from further skilled physical therapy during their acute admission.    Recommendations for follow up therapy are one component of a multi-disciplinary discharge planning process, led by the attending physician.  Recommendations may be updated based on patient status, additional functional criteria and insurance authorization.  Follow Up Recommendations       Assistance Recommended at Discharge Frequent or constant Supervision/Assistance  Patient can return home with the following Two people to help with walking and/or transfers;A lot of help with bathing/dressing/bathroom;Assistance with feeding;Direct supervision/assist for medications management;Assist for transportation;Help with stairs or ramp for entrance   Equipment Recommendations  Other (comment) (TBA)    Recommendations for Other Services Rehab consult     Precautions / Restrictions Precautions Precautions: Fall;Cervical Precaution Booklet Issued: No Precaution Comments: MD states verbally in room that collar can be off supine. pt should have soft collar for the first week working oob Required Braces or Orthoses: Cervical Brace Cervical Brace: Soft collar Restrictions Weight  Bearing Restrictions: No     Mobility  Bed Mobility Overal bed mobility: Needs Assistance Bed Mobility: Supine to Sit Rolling: Supervision   Supine to sit: Min assist          Transfers Overall transfer level: Needs assistance   Transfers: Sit to/from Stand, Bed to chair/wheelchair/BSC Sit to Stand: +2 physical assistance, Min assist   Step pivot transfers: +2 physical assistance, Mod assist       General transfer comment: 2 person "hooking elbows" sit<>stand, improved push up power from BLE, requring min-mod A with verbal cueing to complete step transfer from recliner to [facility]. S2S to EVA walker with min A + 2 to rise.    Ambulation/Gait Ambulation/Gait assistance: Min assist, +2 physical assistance Gait Distance (Feet): 25 Feet Assistive device: Fara Boros Gait Pattern/deviations: Step-through pattern, Decreased stride length, Knee hyperextension - left, Narrow base of support, Knee hyperextension - right   Gait velocity interpretation: <1.31 ft/sec, indicative of household ambulator   General Gait Details: Pt able to progress BLE forward with decreased step length and narrow base of support. Minimal foot clearance from the floor but with improved hip flexion.   Stairs             Wheelchair Mobility    Modified Rankin (Stroke Patients Only)       Balance Overall balance assessment: Needs assistance Sitting-balance support: Feet supported Sitting balance-Leahy Scale: Fair     Standing balance support: Bilateral upper extremity supported, During functional activity, Reliant on assistive device for balance Standing balance-Leahy Scale: Poor Standing balance comment: reliant on external support  Cognition Arousal/Alertness: Awake/alert Behavior During Therapy: WFL for tasks assessed/performed Overall Cognitive Status: Within Functional Limits for tasks assessed                                           Exercises General Exercises - Lower Extremity Long Arc Quad: AROM, 10 reps, Seated, Both Hip Flexion/Marching: AROM, Both, 10 reps, Seated    General Comments General comments (skin integrity, edema, etc.): Pt attempts BM on BSC post ambulation, with increased time and reports feeling impacted. NT notified and BM completed with assist.      Pertinent Vitals/Pain Pain Assessment Pain Assessment: Faces Faces Pain Scale: Hurts little more Pain Descriptors / Indicators: Sore, Radiating Pain Intervention(s): Limited activity within patient's tolerance, Monitored during session    Home Living                          Prior Function            PT Goals (current goals can now be found in the care plan section) Acute Rehab PT Goals Patient Stated Goal: to walk PT Goal Formulation: With patient/family Time For Goal Achievement: 09/05/22 Potential to Achieve Goals: Good Progress towards PT goals: Progressing toward goals    Frequency    Min 4X/week      PT Plan Current plan remains appropriate    Co-evaluation              AM-PAC PT "6 Clicks" Mobility   Outcome Measure  Help needed turning from your back to your side while in a flat bed without using bedrails?: A Little Help needed moving from lying on your back to sitting on the side of a flat bed without using bedrails?: A Little Help needed moving to and from a bed to a chair (including a wheelchair)?: A Little Help needed standing up from a chair using your arms (e.g., wheelchair or bedside chair)?: A Little Help needed to walk in hospital room?: Total Help needed climbing 3-5 steps with a railing? : Total 6 Click Score: 14    End of Session Equipment Utilized During Treatment: Gait belt;Cervical collar Activity Tolerance: Patient tolerated treatment well Patient left: in bed;with call bell/phone within reach;with bed alarm set Nurse Communication: Other (comment) (NT contacted for assist in  BM impaction.) PT Visit Diagnosis: Unsteadiness on feet (R26.81);History of falling (Z91.81);Difficulty in walking, not elsewhere classified (R26.2);Pain Pain - part of body: Shoulder (neck)     Time: 1610-9604 PT Time Calculation (min) (ACUTE ONLY): 42 min  Charges:  $Gait Training: 8-22 mins $Therapeutic Activity: 23-37 mins                    Hendricks Milo, SPT  Acute Rehabilitation Services    Hendricks Milo 08/26/2022, 3:46 PM

## 2022-08-26 NOTE — Progress Notes (Signed)
No new issues or problems.  Exam stable.  Wound healing well.  Patient awaiting rehabilitation transfer when bed available.

## 2022-08-26 NOTE — Progress Notes (Signed)
Occupational Therapy Treatment Patient Details Name: Kenneth Flores MRN: [id number] DOB: 1960/06/20 Today's Date: 08/26/2022   History of present illness Pt is a 62 y.o. 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 Significant PMH: none.   OT comments  Pt with good progress toward established OT goals. Pt performing gross grasp with yellow foam cube and blue foam cube 10x to optimize RUE strength. Self feeding with RUE with red grip with supervision and occasionally requiring to be set up again due to lost grasp; adding theraband to optimize dexterity and build grasp to tubing as well as cutting hold in foam block for adaptive grasp. Able to perform ambulatory transfer with min A +2 with EVA walker this session. Pt continues to be an excellent candidate for intensive inpatient rehabilitation.    Recommendations for follow up therapy are one component of a multi-disciplinary discharge planning process, led by the attending physician.  Recommendations may be updated based on patient status, additional functional criteria and insurance authorization.    Assistance Recommended at Discharge Frequent or constant Supervision/Assistance  Patient can return home with the following  Two people to help with bathing/dressing/bathroom   Equipment Recommendations  Wheelchair (measurements OT);Wheelchair cushion (measurements OT);Hospital bed;Other (comment)    Recommendations for Other Services Rehab consult    Precautions / Restrictions Precautions Precautions: Fall;Cervical Precaution Booklet Issued: No Precaution Comments: MD states verbally in room that collar can be off supine. pt should have soft collar for the first week working oob Required Braces or Orthoses: Cervical Brace Cervical Brace: Soft collar Restrictions Weight Bearing Restrictions: No       Mobility Bed Mobility Overal bed mobility: Needs Assistance Bed Mobility: Supine to Sit,  Sit to Supine     Supine to sit: Min assist Sit to supine: Mod assist   General bed mobility comments: increased time with HOB max elevated to turn out to EOB.  Mod A to bring BLE back into bed    Transfers Overall transfer level: Needs assistance Equipment used: 2 person hand held assist, Ambulation equipment used (EVA walker)               General transfer comment: Min A +2 with pt interlocking arms with OT and mobility specialist. Transition to EVA walker for ambulation     Balance Overall balance assessment: Needs assistance Sitting-balance support: Feet supported Sitting balance-Leahy Scale: Fair     Standing balance support: Bilateral upper extremity supported, During functional activity, Reliant on assistive device for balance Standing balance-Leahy Scale: Poor Standing balance comment: reliant on external support                           ADL either performed or assessed with clinical judgement   ADL Overall ADL's : Needs assistance/impaired Eating/Feeding: Set up;Sitting Eating/Feeding Details (indicate cue type and reason): Sitting EOB, pt able to self feed ~10 bites with built up grip, but observed to lose grip multiple times. Added theraband to build up grip as well as administered foam block with hole through center to trial. Pt able to bring spoon to mouth with foam cube arount spoon after session. Per wife, she has been feeding him the majority of the time.                     Toilet Transfer: Minimal assistance;+2 for physical assistance;+2 for safety/equipment (EVA walker) Toilet Transfer Details (indicate cue type  and reason): with eva walker; cues for base of support         Functional mobility during ADLs: Minimal assistance;+2 for physical assistance;+2 for safety/equipment General ADL Comments: Cues for body mechanics throughout    Extremity/Trunk Assessment Upper Extremity Assessment Upper Extremity Assessment: RUE  deficits/detail;LUE deficits/detail RUE Deficits / Details: deficits remain same :shoulder shrug present, elbow flexion and extension, supination / pronation controlled. Wrist extension present with less than full range, pt able to extend digits with wrist in neutral position. Pt oppose 1st and 2nd digit. Pt has decreased grasp and poor ability to sustain red built up grip in hand at this time but was able to self feed ~10 bites of food. Provided foam cubes (yellow and blue) to optimize strength. Yellow cube with hole cut through it as pt better able to maintain grasp. . RUE Sensation: decreased proprioception RUE Coordination: decreased gross motor;decreased fine motor LUE Deficits / Details: no digit activation, weak trace wrist extension present. pt elbow flexion and extension present. LUE Sensation: decreased proprioception LUE Coordination: decreased fine motor;decreased gross motor   Lower Extremity Assessment Lower Extremity Assessment: Defer to PT evaluation        Vision       Perception     Praxis      Cognition Arousal/Alertness: Awake/alert Behavior During Therapy: WFL for tasks assessed/performed Overall Cognitive Status: Within Functional Limits for tasks assessed                                          Exercises Exercises: Other exercises Other Exercises Other Exercises: Squeezing foam cube 10x    Shoulder Instructions       General Comments Pt attempts BM on BSC post ambulation, with increased time and reports feeling impacted. NT notified and BM completed with assist.    Pertinent Vitals/ Pain       Pain Assessment Pain Assessment: Faces Faces Pain Scale: Hurts little more Pain Location: Radicular pain to bilateral shoulders Pain Descriptors / Indicators: Sore, Radiating Pain Intervention(s): Limited activity within patient's tolerance, Monitored during session  Home Living                                           Prior Functioning/Environment              Frequency  Min 3X/week        Progress Toward Goals  OT Goals(current goals can now be found in the care plan section)  Progress towards OT goals: Progressing toward goals  Acute Rehab OT Goals Patient Stated Goal: go to inpatient rehab OT Goal Formulation: With patient Time For Goal Achievement: 09/06/22 Potential to Achieve Goals: Good ADL Goals Pt Will Perform Eating: with adaptive utensils;sitting;with assist to don/doff brace/orthosis;with min guard assist Additional ADL Goal #1: pt will complete rolling with hooking on bed rail min (A) R and L Additional ADL Goal #2: pt will tolerate eob prop sitting min (A) as precursor to adls Additional ADL Goal #3: pt will complete a phone call using voice commands 100% indep  Plan Discharge plan remains appropriate    Co-evaluation                 AM-PAC OT "6 Clicks" Daily Activity     Outcome Measure  Help from another person eating meals?: A Lot Help from another person taking care of personal grooming?: A Lot Help from another person toileting, which includes using toliet, bedpan, or urinal?: A Lot Help from another person bathing (including washing, rinsing, drying)?: A Lot Help from another person to put on and taking off regular upper body clothing?: A Lot Help from another person to put on and taking off regular lower body clothing?: A Lot 6 Click Score: 12    End of Session Equipment Utilized During Treatment: Gait belt;Other (comment) (EVA walker)  OT Visit Diagnosis: Muscle weakness (generalized) (M62.81)   Activity Tolerance Patient tolerated treatment well   Patient Left in bed;with call bell/phone within reach;with bed alarm set;with family/visitor present;with SCD's reapplied   Nurse Communication Mobility status;Precautions        Time: 8119-1478 OT Time Calculation (min): 30 min  Charges: OT General Charges $OT Visit: 1 Visit OT  Treatments $Self Care/Home Management : 23-37 mins  Tyler Deis, OTR/L [facility] Acute Rehabilitation Office: [phone]   Myrla Halsted 08/26/2022, 5:29 PM

## 2022-08-26 NOTE — Progress Notes (Signed)
Inpatient Rehabilitation Admissions Coordinator   I await bed availability to admit to CIR. Hopeful in next 24 to 48 hrs.  Ottie Glazier, RN, MSN Rehab Admissions Coordinator [phone] 08/26/2022 12:32 PM

## 2022-08-27 ENCOUNTER — Other Ambulatory Visit: Payer: Self-pay

## 2022-08-27 ENCOUNTER — Encounter (HOSPITAL_COMMUNITY): Payer: Self-pay | Admitting: Physical Medicine and Rehabilitation

## 2022-08-27 ENCOUNTER — Inpatient Hospital Stay (HOSPITAL_COMMUNITY)
Admission: RE | Admit: 2022-08-27 | Discharge: 2022-09-16 | DRG: 559 | Disposition: A | Discharge status: Home / Self Care | Payer: No Typology Code available for payment source | Source: Intra-hospital | Attending: Physical Medicine and Rehabilitation | Admitting: Physical Medicine and Rehabilitation

## 2022-08-27 DIAGNOSIS — G4700 Insomnia, unspecified: Secondary | ICD-10-CM | POA: Diagnosis not present

## 2022-08-27 DIAGNOSIS — F17290 Nicotine dependence, other tobacco product, uncomplicated: Secondary | ICD-10-CM | POA: Diagnosis present

## 2022-08-27 DIAGNOSIS — R1310 Dysphagia, unspecified: Secondary | ICD-10-CM | POA: Diagnosis present

## 2022-08-27 DIAGNOSIS — R338 Other retention of urine: Secondary | ICD-10-CM | POA: Diagnosis present

## 2022-08-27 DIAGNOSIS — E871 Hypo-osmolality and hyponatremia: Secondary | ICD-10-CM | POA: Diagnosis present

## 2022-08-27 DIAGNOSIS — G8250 Quadriplegia, unspecified: Secondary | ICD-10-CM

## 2022-08-27 DIAGNOSIS — I10 Essential (primary) hypertension: Secondary | ICD-10-CM | POA: Diagnosis present

## 2022-08-27 DIAGNOSIS — Z4789 Encounter for other orthopedic aftercare: Secondary | ICD-10-CM | POA: Diagnosis present

## 2022-08-27 DIAGNOSIS — N319 Neuromuscular dysfunction of bladder, unspecified: Secondary | ICD-10-CM | POA: Diagnosis present

## 2022-08-27 DIAGNOSIS — G8254 Quadriplegia, C5-C7 incomplete: Secondary | ICD-10-CM | POA: Diagnosis present

## 2022-08-27 DIAGNOSIS — S14155S Other incomplete lesion at C5 level of cervical spinal cord, sequela: Secondary | ICD-10-CM | POA: Diagnosis not present

## 2022-08-27 DIAGNOSIS — K592 Neurogenic bowel, not elsewhere classified: Secondary | ICD-10-CM | POA: Diagnosis present

## 2022-08-27 DIAGNOSIS — K5900 Constipation, unspecified: Secondary | ICD-10-CM | POA: Diagnosis present

## 2022-08-27 DIAGNOSIS — M4802 Spinal stenosis, cervical region: Secondary | ICD-10-CM | POA: Diagnosis present

## 2022-08-27 DIAGNOSIS — Z79899 Other long term (current) drug therapy: Secondary | ICD-10-CM | POA: Diagnosis not present

## 2022-08-27 DIAGNOSIS — S14155D Other incomplete lesion at C5 level of cervical spinal cord, subsequent encounter: Secondary | ICD-10-CM | POA: Diagnosis not present

## 2022-08-27 DIAGNOSIS — F4321 Adjustment disorder with depressed mood: Secondary | ICD-10-CM | POA: Diagnosis present

## 2022-08-27 DIAGNOSIS — M792 Neuralgia and neuritis, unspecified: Secondary | ICD-10-CM | POA: Diagnosis not present

## 2022-08-27 DIAGNOSIS — S14155A Other incomplete lesion at C5 level of cervical spinal cord, initial encounter: Secondary | ICD-10-CM | POA: Insufficient documentation

## 2022-08-27 LAB — GLUCOSE, CAPILLARY
Glucose-Capillary: 137 mg/dL — ABNORMAL HIGH (ref 70–99)
Glucose-Capillary: 124 mg/dL — ABNORMAL HIGH (ref 70–99)

## 2022-08-27 MED ORDER — INSULIN ASPART 100 UNIT/ML IJ SOLN: 0.0000 [IU] | Freq: Three times a day (TID) | INTRAMUSCULAR | Status: DC

## 2022-08-27 MED ORDER — MENTHOL 3 MG MT LOZG: 1.0000 | LOZENGE | OROMUCOSAL | Status: DC | PRN

## 2022-08-27 MED ORDER — DOCUSATE SODIUM 100 MG PO CAPS
100.0000 mg | ORAL_CAPSULE | Freq: Two times a day (BID) | ORAL | Status: DC
  Administered 2022-08-27 – 2022-09-16 (×40): 100 mg via ORAL
  Filled 2022-08-27 (×40): qty 1

## 2022-08-27 MED ORDER — SORBITOL 70 % SOLN: 30.0000 mL | Freq: Every day | Status: DC | PRN

## 2022-08-27 MED ORDER — HYDROCODONE-ACETAMINOPHEN 10-325 MG PO TABS
2.0000 | ORAL_TABLET | ORAL | Status: DC | PRN
  Administered 2022-08-27 – 2022-09-16 (×70): 2 via ORAL
  Filled 2022-08-27 (×72): qty 2

## 2022-08-27 MED ORDER — TRAZODONE HCL 50 MG PO TABS
50.0000 mg | ORAL_TABLET | Freq: Every evening | ORAL | Status: DC | PRN
  Administered 2022-08-31 – 2022-09-06 (×6): 50 mg via ORAL
  Filled 2022-08-27 (×6): qty 1

## 2022-08-27 MED ORDER — POLYETHYLENE GLYCOL 3350 17 G PO PACK: 17.0000 g | PACK | Freq: Every day | ORAL | Status: DC | PRN

## 2022-08-27 MED ORDER — DIAZEPAM 2 MG PO TABS
2.0000 mg | ORAL_TABLET | Freq: Four times a day (QID) | ORAL | Status: DC | PRN
  Administered 2022-09-01 – 2022-09-06 (×4): 2 mg via ORAL
  Filled 2022-08-27 (×4): qty 1

## 2022-08-27 MED ORDER — DIPHENHYDRAMINE HCL 25 MG PO CAPS: 25.0000 mg | ORAL_CAPSULE | Freq: Four times a day (QID) | ORAL | Status: DC | PRN

## 2022-08-27 MED ORDER — METHOCARBAMOL 500 MG PO TABS
500.0000 mg | ORAL_TABLET | Freq: Four times a day (QID) | ORAL | Status: DC | PRN
  Administered 2022-08-28 – 2022-09-15 (×22): 500 mg via ORAL
  Filled 2022-08-27 (×22): qty 1

## 2022-08-27 MED ORDER — GABAPENTIN 300 MG PO CAPS
300.0000 mg | ORAL_CAPSULE | Freq: Three times a day (TID) | ORAL | Status: DC
  Administered 2022-08-27: 300 mg via ORAL
  Filled 2022-08-27: qty 1

## 2022-08-27 MED ORDER — SORBITOL 70 % SOLN
45.0000 mL | Freq: Once | Status: AC
  Administered 2022-08-27: 45 mL via ORAL
  Filled 2022-08-27: qty 60

## 2022-08-27 MED ORDER — ACETAMINOPHEN 325 MG PO TABS: 325.0000 mg | ORAL_TABLET | ORAL | Status: DC | PRN

## 2022-08-27 MED ORDER — ONDANSETRON HCL 4 MG/2ML IJ SOLN: 4.0000 mg | Freq: Four times a day (QID) | INTRAMUSCULAR | Status: DC | PRN

## 2022-08-27 MED ORDER — PHENOL 1.4 % MT LIQD: 1.0000 | OROMUCOSAL | Status: DC | PRN

## 2022-08-27 MED ORDER — ONDANSETRON HCL 4 MG PO TABS: 4.0000 mg | ORAL_TABLET | Freq: Four times a day (QID) | ORAL | Status: DC | PRN

## 2022-08-27 MED ORDER — GUAIFENESIN-DM 100-10 MG/5ML PO SYRP: 10.0000 mL | ORAL_SOLUTION | Freq: Four times a day (QID) | ORAL | Status: DC | PRN

## 2022-08-27 MED ORDER — HYDROCODONE-ACETAMINOPHEN 5-325 MG PO TABS
1.0000 | ORAL_TABLET | ORAL | Status: DC | PRN
  Administered 2022-08-27 – 2022-09-15 (×15): 1 via ORAL
  Filled 2022-08-27 (×15): qty 1

## 2022-08-27 MED ORDER — LIDOCAINE 5 % EX PTCH
2.0000 | MEDICATED_PATCH | CUTANEOUS | Status: DC
  Administered 2022-08-27 – 2022-09-14 (×19): 2 via TRANSDERMAL
  Filled 2022-08-27 (×19): qty 2

## 2022-08-27 MED ORDER — GABAPENTIN 400 MG PO CAPS
400.0000 mg | ORAL_CAPSULE | Freq: Three times a day (TID) | ORAL | Status: DC
  Administered 2022-08-27 – 2022-09-01 (×14): 400 mg via ORAL
  Filled 2022-08-27 (×14): qty 1

## 2022-08-27 MED ORDER — ALUM & MAG HYDROXIDE-SIMETH 200-200-20 MG/5ML PO SUSP: 30.0000 mL | ORAL | Status: DC | PRN

## 2022-08-27 MED ORDER — FLEET ENEMA 7-19 GM/118ML RE ENEM: 1.0000 | ENEMA | Freq: Once | RECTAL | Status: DC | PRN

## 2022-08-27 NOTE — Progress Notes (Signed)
Orthopedic Tech Progress Note Patient Details:  Kenneth Flores 1960-11-03 [id number]  Routine order for resting WHO called into [facility] by Roseanne Reno (ortho tech)  Patient ID: Kenneth Flores, male   DOB: 20-Jul-1960, 62 y.o.   MRN: [id number]  Docia Furl 08/27/2022, 7:03 PM

## 2022-08-27 NOTE — H&P (Signed)
Physical Medicine and Rehabilitation Admission H&P   CC: Functional deficits secondary to incomplete spinal cord injury  HPI: Patient is a 62 year old R handed male with hx of chronic Hep C, status post micturition related syncope from vasovagal episode at home on 08/19/2022 resulting in fall with blow to head. Imaging revealed evidence of significant multilevel cervical degenerative disease with associated disc space collapse and associated spondylosis. Patient with critical spinal stenosis with severe cord compression and cord signal change at C3-4. Severe stenosis at C4-5 and C5-6 without marked cord signal abnormality at these levels. There is some changes posteriorly from C3-C6 with some worrisome findings for some posterior ligamentous injury. There is no evidence of fracture on his CT scan. There is no evidence of malalignment. Neurosurgery consulted and admitted the patient for IV steroids and pain control. On 5/30, he was taken to the OR and underwent C3-4, C4-5, C5-6 anterior cervical discectomy with interbody fusion utilizing interbody cages, local harvested autograft, and anterior plate instrumentation by Dr. Jordan Likes. Motor function improved. Incision healing without signs of infection. Labs stable. Past medical history is significant for chronic hepatitis C; genotype 1a and is s/p Vosevi time 12 weeks. Undetectable viral RNA 01/21/2022. 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   He is complaining of ongoing left arm numbness and discomfort. Had small hard stool yesterday but that's it since admission- no other BM's. Using purewick/condom cath- can feel when needs to go, however cannot control it when goes-when has to go, needs to go immediately;  not sure if empties or not. Prior to admission, had slowed stream, likely had prostate issues. Urine dark- admits might not be drinking enough.    Having burning pain in LUE mainly- especially L hand- gabapentin isn't working enough.   Having a lot of neck pain- feels like massive muscle spasms and feels really tight.  Doesn't think Robaxin is enough-  A pain medicine he received is making him really sleepy- when done, wanted to take a nap.    Lives at home with wife. Smokes Black & Mild cigars>>denies nicotine cravings. Drinking ~two beers daily.  Review of Systems  Constitutional:  Negative for chills and fever.  HENT:  Positive for sore throat. Negative for congestion.   Eyes:  Negative for blurred vision and double vision.  Respiratory:  Negative for cough and shortness of breath.   Cardiovascular:  Negative for chest pain and palpitations.  Gastrointestinal:  Positive for constipation. Negative for nausea and vomiting.  Genitourinary:  Negative for dysuria and urgency.  Musculoskeletal:  Positive for neck pain.  Neurological:  Positive for headaches. Negative for dizziness.  Psychiatric/Behavioral:  Negative for depression. The patient is not nervous/anxious.   All other systems reviewed and are negative.  Past Medical History:  Diagnosis Date   Hepatitis C    Past Surgical History:  Procedure Laterality Date   ANTERIOR CERVICAL DECOMP/DISCECTOMY FUSION N/A 08/21/2022   Procedure: ANTERIOR CERVICAL DECOMPRESSION/DISCECTOMY FUSION CERVICAL THREE-FOUR, CERVICAL FOUR-FIVE, CERVICAL FIVE-SIX;  Surgeon: Julio Sicks, MD;  Location: [facility] OR;  Service: Neurosurgery;  Laterality: N/A;   PELVIC FRACTURE SURGERY Right    Family History  Problem Relation Age of Onset   Hepatitis C Brother    Social History:  reports that he has been smoking cigars. He has never used smokeless tobacco. He reports that he does not currently use alcohol. He reports that he does not currently use drugs after  having used the following drugs: Cocaine and Marijuana. Allergies: No Known Allergies No medications prior to admission.      Home: Home  Living Family/patient expects to be discharged to:: Private residence Living Arrangements: Spouse/significant other, Children (stepson) Available Help at Discharge: Family, Available 24 hours/day Type of Home: House Home Access: Level entry Entrance Stairs-Number of Steps: 1 Home Layout: Two level, 1/2 bath on main level Alternate Level Stairs-Number of Steps: 10-12 Alternate Level Stairs-Rails: Can reach both Bathroom Shower/Tub: Engineer, manufacturing systems: Standard Bathroom Accessibility: Yes Home Equipment: None Additional Comments: Reports 2 falls within past couple weeks; both syncopal episodes when using the bathroom in the middle of the night  Lives With: Spouse, Son   Functional History: Prior Function Prior Level of Function : Independent/Modified Independent 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  Mod A to bring BLE back into bed Transfers Overall transfer level: Needs assistance Equipment used: 2 person hand held assist, Ambulation equipment used (EVA walker) Transfers: Sit to/from Stand, Bed to chair/wheelchair/BSC Sit to Stand: +2 physical assistance, Min assist Bed to/from chair/wheelchair/BSC transfer type:: Step pivot Stand pivot transfers: Min assist, +2 physical assistance Step pivot transfers: +2 physical assistance, Mod assist General transfer comment: Min A +2 with pt interlocking arms with OT and mobility specialist. Transition to EVA walker for ambulation Ambulation/Gait Ambulation/Gait assistance: Min assist, +2 physical assistance Gait Distance (Feet): 25 Feet Assistive device: Fara Boros Gait  Pattern/deviations: Step-through pattern, Decreased stride length, Knee hyperextension - left, Narrow base of support, Knee hyperextension - right General Gait Details: Pt able to progress BLE forward with decreased step length and narrow base of support. Minimal foot clearance from the floor but with improved hip flexion. Gait velocity interpretation: <1.31 ft/sec, indicative of household ambulator    ADL: ADL Overall ADL's : Needs assistance/impaired Eating/Feeding: Set up, Sitting Eating/Feeding Details (indicate cue type and reason): Sitting EOB, pt able to self feed ~10 bites with built up grip, but observed to lose grip multiple times. Added theraband to build up grip as well as administered foam block with hole through center to trial. Pt able to bring spoon to mouth with foam cube arount spoon after session. Per wife, she has been feeding him the majority of the time. Toilet Transfer: Minimal assistance, +2 for physical assistance, +2 for safety/equipment (EVA walker) Toilet Transfer Details (indicate cue type and reason): with eva walker; cues for base of support Functional mobility during ADLs: Minimal assistance, +2 for physical assistance, +2 for safety/equipment General ADL Comments: Cues for body mechanics throughout  Cognition: Cognition Overall Cognitive Status: Within Functional Limits for tasks assessed Orientation Level: Oriented X4 Cognition Arousal/Alertness: Awake/alert Behavior During Therapy: WFL for tasks assessed/performed Overall Cognitive Status: Within Functional Limits for tasks assessed  Physical Exam: Blood pressure (!) 139/96, pulse 75, temperature 98 F (36.7 C), temperature source Oral, resp. rate 17, height 6' (1.829 m), weight 74.8 kg, SpO2 99 %. Physical Exam Vitals and nursing note reviewed.  Constitutional:      General: He is not in acute distress.    Comments: Sitting up in bed; but keeps trying to rub neck, in obvious discomfort, keeps  stretching, rotating head  as well, no acute distress  HENT:     Head: Normocephalic and atraumatic.     Comments: No facial droop Facial sensation intact    Right Ear: External ear normal.     Left Ear: External ear normal.     Nose: Nose normal. No congestion.     Mouth/Throat:     Mouth: Mucous membranes are dry.     Pharynx: Oropharynx is clear. No oropharyngeal exudate.  Eyes:     General:        Right eye: No discharge.        Left eye: No discharge.     Extraocular Movements: Extraocular movements intact.     Pupils: Pupils are equal, round, and reactive to light.  Neck:     Comments: Surgical incision covered with steri-strips; + mild edema Severe tightness of B/L neck/upper back/shoulders- rhomboids, splenius capitus, upper traps and levators and scalenes all tight Cardiovascular:     Rate and Rhythm: Normal rate and regular rhythm.     Heart sounds: Normal heart sounds. No murmur heard.    No gallop.  Pulmonary:     Effort: Pulmonary effort is normal. No respiratory distress.     Breath sounds: Normal breath sounds. No wheezing, rhonchi or rales.  Abdominal:     Comments: Soft, NT< however slightly distended; and hypoactive  Musculoskeletal:     Right lower leg: No edema.     Left lower leg: No edema.     Comments: RUE- biceps 4+/5; triceps 4-/5; WE 3+/5; Grip 4-/5; FA 2/5 LUE- biceps 3/5; triceps 2-/5; WE 2-/5; grip 2/5 and FA 1/5 RLE- HF 4/5; KE 4+/5; DF 4/5; PF 4+/5 and EHL 4/5 LLE- HF 4-/5; KE 4/5 DF 4-/5; PF 4/5 and EHL 4-/5   Skin:    General: Skin is warm and dry.     Comments: L forearm IV_ looks OK  Neurological:     Mental Status: He is alert.     Motor: Weakness present.     Comments: Decreased to light touch from C6 downwards- C3,4, 5 appear intact per pt report/testing Is decreased all the way to S2 testing No hoffman's no increased tone and absent DTRs in Ue's and LE's   Psychiatric:        Mood and Affect: Mood normal.        Behavior: Behavior  normal.     Comments: Disbelieving of SCI     No results found for this or any previous visit (from the past 48 hour(s)). No results found.    Blood pressure (!) 139/96, pulse 75, temperature 98 F (36.7 C), temperature source Oral, resp. rate 17, height 6' (1.829 m), weight 74.8 kg, SpO2 99 %.  Medical Problem List and Plan: 1. 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 Patient is status post three-level anterior cervical decompression and fusion 5/30.   -patient may  shower if cover incision  -ELOS/Goals: ~ 3 weeks- supervision to min A  Admit to CIR- will need WHO splint LUE- ordered- won't need PRAFOs  -suggest estim- no hx of Cancer 2.  Antithrombotics: -DVT/anticoagulation:  Mechanical:  Antiembolism stockings, knee (TED hose) Bilateral lower extremities Will need Lovenox as soon as allowed by surgery  -antiplatelet therapy: none  3. Pain Management: Tylenol, Flexeril, Norco 5 or 10, Robaxin as needed  -gabapentin 400 mg TID- increased Gabapentin due to nerve pain- added Valium 2 mg q6 hours prn  for muscle tightness of neck and lidoderm patches 8pm to 8am  4. Mood/Behavior/Sleep: LCSW to evaluate and provide emotional support  -antipsychotic agents: n/a  5. Neuropsych/cognition: This patient is capable of making decisions on his own behalf.  6. Skin/Wound Care: Routine skin care checks  -monitor surgical incision   7. Fluids/Electrolytes/Nutrition: Routine Is and Os and follow-up chemistries  8: Constipation due to neurogenic bowel: currently Colace 100 mg BID, Miralax prn  -gave 45 cc Sorbitol to get pt to go and will follow with soap suds enema if need be tomorrow  9: History of chronic hepatitis C: completed treatment; follows with ID  10. Neurogenic bladder with urinary retention- only voiding he's doing is overflow, not true urination -scheduled bladder scans q6 hours and will have pt  cathed if volumes >350cc- required cath as soon as came to rehab.        Milinda Antis, PA-C 08/27/2022    I have personally performed a face to face diagnostic evaluation of this patient and formulated the key components of the plan.  Additionally, I have personally reviewed laboratory data, imaging studies, as well as relevant notes and concur with the physician assistant's documentation above.   The patient's status has not changed from the original H&P.  Any changes in documentation from the acute care chart have been noted above.    I spent a total of 86   minutes on total care today- >50% coordination of care- due to prolonged ASIA exam, review of chart, putting in order changes and d/w nursing about bladder scan/need for cath and making order changes from that- also typing up exam, plan.

## 2022-08-27 NOTE — Progress Notes (Addendum)
Inpatient Rehabilitation Admission Medication Review by a Pharmacist  A complete drug regimen review was completed for this patient to identify any potential clinically significant medication issues.  High Risk Drug Classes Is patient taking? Indication by Medication  Antipsychotic No   Anticoagulant No   Antibiotic No   Opioid Yes Vicodin prn pain  Antiplatelet No   Hypoglycemics/insulin Yes Insulin - CBG coverage  Vasoactive Medication No   Chemotherapy No   Other Yes Methocarbamol prn spasms Trazodone prn sleep Ondansetron prn N/V Gabapentin - pain     Type of Medication Issue Identified Description of Issue Recommendation(s)  Drug Interaction(s) (clinically significant)     Duplicate Therapy     Allergy     No Medication Administration End Date     Incorrect Dose     Additional Drug Therapy Needed     Significant med changes from prior encounter (inform family/care partners about these prior to discharge).    Other       Clinically significant medication issues were identified that warrant physician communication and completion of prescribed/recommended actions by midnight of the next day:  No  Name of provider notified for urgent issues identified:   Provider Method of Notification:     Pharmacist comments: None - no medications prior to admission Stop SSI coverage?  Time spent performing this drug regimen review (minutes):  20 minutes  Thank you Okey Regal, PharmD

## 2022-08-27 NOTE — Plan of Care (Signed)

## 2022-08-27 NOTE — Progress Notes (Signed)
Inpatient Rehabilitation Admissions Coordinator   I have CIR bed to admit him to today. Acute team and TOC made aware. I contacted Meghan NP with Dr Jordan Likes and will make the arrangements.  Ottie Glazier, RN, MSN Rehab Admissions Coordinator [phone] 08/27/2022 10:13 AM

## 2022-08-27 NOTE — Discharge Summary (Signed)
Physician Discharge Summary  Patient ID: Kenneth Flores MRN: [id number] DOB/AGE: November 26, 1960 62 y.o.  Admit date: 08/19/2022 Discharge date: 08/27/2022  Admission Diagnoses:  Discharge Diagnoses:  Principal Problem:   Cervical spinal cord injury [facility])   Discharged Condition: good  Hospital Course: Patient admitted to the hospital after a fall with resultant incomplete spinal cord injury.  Workup demonstrates evidence of severe stenosis with marked cord compression and cord signal abnormality.  Patient underwent uncomplicated 3 level anterior cervical decompression and fusion surgery.  Postop [name] the patient is slowly improving.  He continues to have profound weakness in his left upper extremity and to a lesser degree left lower extremity.  His right-sided strength is diminished in his right distal upper extremity but this is also improving with time.  Right lower extremity strength near normal.  He is recovering from his cervical surgery well.  He is ready for transfer to inpatient rehabilitation.  Consults:   Significant Diagnostic Studies:   Treatments:   Discharge Exam: Blood pressure (!) 139/96, pulse 75, temperature 98 F (36.7 C), temperature source Oral, resp. rate 17, height 6' (1.829 m), weight 74.8 kg, SpO2 99 %.   Patient is awake and alert.  He is oriented and appropriate.  Speech is fluent.  Judgment insight are intact.  Cranial nerve function normal bilaterally.  Motor examination with near normal right deltoid biceps wrist extension and triceps function.  He has 4 5 weakness in his right grips and intrinsic.  Right lower extremity strength 4+ over 5 with some increased tone.  Left upper extremity with 4/5 strength in his left deltoid.  He has 4/5 strength in his left biceps.  He has to over 5 strength in his left wrist extensors.  He has 3/5 strength in his left triceps function.  He has minimal grip an absent intrinsic function his left hand.  Left lower extremity is 4- over 5.   Wound is healing well.  Chest and abdomen are benign.  Neck is soft.  Disposition: Discharge disposition: 02-Transferred to [facility]        Allergies as of 08/27/2022   No Known Allergies      Medication List    You have not been prescribed any medications.      Signed: Kathaleen Maser Nayelie Gionfriddo 08/27/2022, 10:19 AM

## 2022-08-27 NOTE — Progress Notes (Addendum)
Genice Rouge, MD  Physician Physical Medicine and Rehabilitation   PMR Pre-admission    Signed   Date of Service: 08/23/2022  1:05 PM  Related encounter: ED to Hosp-Admission (Discharged) from 08/19/2022 in [facility]   Signed      Show:Clear all [x] Written[x] Templated[x] Copied  Added by: [x] Standley Brooking, RN[x] Theodoro Kos, Lauren Demetrius Charity, CCC-SLP[x] Genice Rouge, MD  [] Hover for details PMR Admission Coordinator Pre-Admission Assessment   Patient: Kenneth Flores is an 62 y.o., male MRN: [id number] DOB: 1961/02/25 Height: 6' (182.9 cm) Weight: 74.8 kg   Insurance Information HMO:     PPO: yes     PCP:      IPA:      80/20:      OTHER:  PRIMARY: [employer]      Policy#: [id number]      Subscriber: patient CM Name:Sara Mastrogianakis  Phone#: [phone] option 1, ext [phone]     Fax#: [phone] Pre-Cert#: [id number]   Pt approved for 7 days 08/27/22 until 09/03/22  F/u with Christa Nesmith phone [phone] option [phone] ext [phone] fax [phone]   Employer: [employer] Benefits:  Phone #: [phone]     Name:  Eff. Date: 06/21/21-12/31/999     Deduct: $6,000 ($0 met)      Out of Pocket Max: $7,000 ($0 met)      Life Max: NA CIR: 70% coverage, 30% co-insurance      SNF: 70% coverage, 30% co-insurance Outpatient: 70% coverage     Co-Pay: 30% co-insurance Home Health: 70% coverage      Co-Pay: 30% co-insurance DME: 70% coverage     Co-Pay: 30% co-insurance Providers: in-network  SECONDARY:    none    Financial Counselor:       Phone#:    The Data processing manager" for patients in Inpatient Rehabilitation Facilities with attached "Privacy Act Statement-Health Care Records" was provided and verbally reviewed with: N/A   Emergency Contact Information Contact Information       Name Relation Home Work Mobile    [place] Spouse [phone]   [phone]         Current Medical History  Patient Admitting  Diagnosis: Kenneth Flores is a 62 year old male status post micturition related syncope from vasovagal episode at home on 08/19/2022 resulting in fall with blow to head.Presented to [facility] ED on 08/19/22. Imaging revealed evidence of significant multilevel cervical degenerative disease with associated disc space collapse and associated spondylosis.    Patient with critical spinal stenosis with severe cord compression and cord signal change at C3-4. Severe stenosis at C4-5 and C5-6 without marked cord signal abnormality at these levels. There is some changes posteriorly from C3-C6 with some worrisome findings for some posterior ligamentous injury. There is no evidence of fracture on his CT scan. There is no evidence of malalignment. Neurosurgery consulted and admitted the patient for IV steroids and pain control. On 5/30, he was taken to the OR and underwent C3-4, C4-5, C5-6 anterior cervical discectomy with interbody fusion utilizing interbody cages, local harvested autograft, and anterior plate instrumentation by Dr. Jordan Likes. Motor function improved. Incision healing without signs of infection. Labs stable. Past medical history is significant for chronic hepatitis C; genotype 1a and is s/p Vosevi time 12 weeks. Undetectable viral RNA 01/21/2022.    Patient's medical record from [facility] has been reviewed by the rehabilitation admission coordinator and physician.   Past Medical History  Past Medical History:  Diagnosis Date   Hepatitis C      Has the patient had major surgery during 100 days prior to admission? Yes   Family History   family history includes Hepatitis C in his brother.   Current Medications   Current Facility-Administered Medications:    0.9 %  sodium chloride infusion, 250 mL, Intravenous, Continuous, Pool, Sherilyn Cooter, MD, Stopped at 08/22/22 1718   acetaminophen (TYLENOL) tablet 650 mg, 650 mg, Oral, Q4H PRN, 650 mg at 08/23/22 2214 **OR** acetaminophen (TYLENOL)  suppository 650 mg, 650 mg, Rectal, Q4H PRN, Julio Sicks, MD   cyclobenzaprine (FLEXERIL) tablet 10 mg, 10 mg, Oral, TID PRN, Julio Sicks, MD, 10 mg at 08/27/22 0910   docusate sodium (COLACE) capsule 100 mg, 100 mg, Oral, BID, Julio Sicks, MD, 100 mg at 08/27/22 1610   gabapentin (NEURONTIN) capsule 300 mg, 300 mg, Oral, TID, Julio Sicks, MD, 300 mg at 08/27/22 0852   HYDROcodone-acetaminophen (NORCO) 10-325 MG per tablet 2 tablet, 2 tablet, Oral, Q4H PRN, Julio Sicks, MD, 2 tablet at 08/27/22 0520   HYDROcodone-acetaminophen (NORCO/VICODIN) 5-325 MG per tablet 1 tablet, 1 tablet, Oral, Q4H PRN, Julio Sicks, MD   HYDROmorphone (DILAUDID) injection 1 mg, 1 mg, Intravenous, Q2H PRN, Julio Sicks, MD, 1 mg at 08/27/22 9604   menthol-cetylpyridinium (CEPACOL) lozenge 3 mg, 1 lozenge, Oral, PRN, 3 mg at 08/21/22 2200 **OR** phenol (CHLORASEPTIC) mouth spray 1 spray, 1 spray, Mouth/Throat, PRN, Julio Sicks, MD, 1 spray at 08/26/22 1652   ondansetron (ZOFRAN) tablet 4 mg, 4 mg, Oral, Q6H PRN **OR** ondansetron (ZOFRAN) injection 4 mg, 4 mg, Intravenous, Q6H PRN, Pool, Sherilyn Cooter, MD   polyethylene glycol (MIRALAX / GLYCOLAX) packet 17 g, 17 g, Oral, Daily PRN, Julio Sicks, MD, 17 g at 08/23/22 1316   sodium chloride flush (NS) 0.9 % injection 3 mL, 3 mL, Intravenous, Q12H, Pool, Sherilyn Cooter, MD, 3 mL at 08/27/22 0852   sodium chloride flush (NS) 0.9 % injection 3 mL, 3 mL, Intravenous, PRN, Julio Sicks, MD, 3 mL at 08/24/22 0845   Patients Current Diet:  Diet Order                  Diet regular Room service appropriate? Yes; Fluid consistency: Thin  Diet effective now                       Precautions / Restrictions Precautions Precautions: Fall, Cervical Precaution Booklet Issued: No Precaution Comments: MD states verbally in room that collar can be off supine. pt should have soft collar for the first week working oob Cervical Brace: Soft collar Restrictions Weight Bearing Restrictions: No    Has  the patient had 2 or more falls or a fall with injury in the past year? Yes   Prior Activity Level Community (5-7x/wk): works, drives, gets out of house often   Prior Functional Level Self Care: Did the patient need help bathing, dressing, using the toilet or eating? Independent   Indoor Mobility: Did the patient need assistance with walking from room to room (with or without device)? Independent   Stairs: Did the patient need assistance with internal or external stairs (with or without device)? Independent   Functional Cognition: Did the patient need help planning regular tasks such as shopping or remembering to take medications? Independent   Patient Information Are you of Hispanic, Latino/a,or Spanish origin?: A. No, not of Hispanic, Latino/a, or Spanish origin What is your race?: B. Black or African  American Do you need or want an interpreter to communicate with a doctor or health care staff?: 0. No   Patient's Response To:  Health Literacy and Transportation Is the patient able to respond to health literacy and transportation needs?: Yes Health Literacy - How often do you need to have someone help you when you read instructions, pamphlets, or other written material from your doctor or pharmacy?: Never In the past 12 months, has lack of transportation kept you from medical appointments or from getting medications?: No In the past 12 months, has lack of transportation kept you from meetings, work, or from getting things needed for daily living?: No   Journalist, newspaper / Equipment Home Assistive Devices/Equipment: None Home Equipment: None   Prior Device Use: Indicate devices/aids used by the patient prior to current illness, exacerbation or injury? None of the above   Current Functional Level Cognition   Overall Cognitive Status: Within Functional Limits for tasks assessed Orientation Level: Oriented X4    Extremity Assessment (includes Sensation/Coordination)   Upper  Extremity Assessment: RUE deficits/detail, LUE deficits/detail RUE Deficits / Details: deficits remain same :shoulder shrug present, elbow flexion and extension, supination / pronation controlled. Wrist extension present with less than full range, pt able to extend digits with wrist in neutral position. Pt oppose 1st and 2nd digit. Pt has decreased grasp and poor ability to sustain red built up grip in hand at this time but was able to self feed ~10 bites of food. Provided foam cubes (yellow and blue) to optimize strength. Yellow cube with hole cut through it as pt better able to maintain grasp. . RUE Sensation: decreased proprioception RUE Coordination: decreased gross motor, decreased fine motor LUE Deficits / Details: no digit activation, weak trace wrist extension present. pt elbow flexion and extension present. LUE Sensation: decreased proprioception LUE Coordination: decreased fine motor, decreased gross motor  Lower Extremity Assessment: Defer to PT evaluation RLE Deficits / Details: Grossly 4+/5 RLE Sensation: decreased light touch LLE Deficits / Details: Grossly 3-/5, pt reports decreased sensation in comparison to RLE, but intact to light touch upon testing LLE Sensation: decreased light touch     ADLs   Overall ADL's : Needs assistance/impaired Eating/Feeding: Set up, Sitting Eating/Feeding Details (indicate cue type and reason): Sitting EOB, pt able to self feed ~10 bites with built up grip, but observed to lose grip multiple times. Added theraband to build up grip as well as administered foam block with hole through center to trial. Pt able to bring spoon to mouth with foam cube arount spoon after session. Per wife, she has been feeding him the majority of the time. Toilet Transfer: Minimal assistance, +2 for physical assistance, +2 for safety/equipment (EVA walker) Toilet Transfer Details (indicate cue type and reason): with eva walker; cues for base of support Functional mobility  during ADLs: Minimal assistance, +2 for physical assistance, +2 for safety/equipment General ADL Comments: Cues for body mechanics throughout     Mobility   Overal bed mobility: Needs Assistance Bed Mobility: Supine to Sit, Sit to Supine Rolling: Supervision Sidelying to sit: Max assist, +2 for physical assistance Supine to sit: Min assist Sit to supine: Mod assist General bed mobility comments: increased time with HOB max elevated to turn out to EOB.  Mod A to bring BLE back into bed     Transfers   Overall transfer level: Needs assistance Equipment used: 2 person hand held assist, Ambulation equipment used (EVA walker) Transfers: Sit to/from Stand, Bed to  chair/wheelchair/BSC Sit to Stand: +2 physical assistance, Min assist Bed to/from chair/wheelchair/BSC transfer type:: Step pivot Stand pivot transfers: Min assist, +2 physical assistance Step pivot transfers: +2 physical assistance, Mod assist General transfer comment: Min A +2 with pt interlocking arms with OT and mobility specialist. Transition to EVA walker for ambulation     Ambulation / Gait / Stairs / Wheelchair Mobility   Ambulation/Gait Ambulation/Gait assistance: Min assist, +2 physical assistance Gait Distance (Feet): 25 Feet Assistive device: Fara Boros Gait Pattern/deviations: Step-through pattern, Decreased stride length, Knee hyperextension - left, Narrow base of support, Knee hyperextension - right General Gait Details: Pt able to progress BLE forward with decreased step length and narrow base of support. Minimal foot clearance from the floor but with improved hip flexion. Gait velocity interpretation: <1.31 ft/sec, indicative of household ambulator     Posture / Balance Dynamic Sitting Balance Sitting balance - Comments: 2-3 episodes of posterior LOB, requiring minA to correct Balance Overall balance assessment: Needs assistance Sitting-balance support: Feet supported Sitting balance-Leahy Scale: Fair Sitting  balance - Comments: 2-3 episodes of posterior LOB, requiring minA to correct Standing balance support: Bilateral upper extremity supported, During functional activity, Reliant on assistive device for balance Standing balance-Leahy Scale: Poor Standing balance comment: reliant on external support     Special needs/care consideration Soft touch call light Fall precautions    Previous Home Environment  Living Arrangements: Spouse/significant other, Children (stepson)  Lives With: Spouse, Son Available Help at Discharge: Family, Available 24 hours/day Type of Home: House Home Layout: Two level, 1/2 bath on main level Alternate Level Stairs-Rails: Can reach both Alternate Level Stairs-Number of Steps: 10-12 Home Access: Level entry Entrance Stairs-Number of Steps: 1 Bathroom Shower/Tub: Engineer, manufacturing systems: Standard Bathroom Accessibility: Yes How Accessible: Accessible via walker Home Care Services: No Additional Comments: Reports 2 falls within past couple weeks; both syncopal episodes when using the bathroom in the middle of the night   Discharge Living Setting Plans for Discharge Living Setting: Patient's home Type of Home at Discharge: House Discharge Home Layout: Two level, 1/2 bath on main level Alternate Level Stairs-Rails: Can reach both Alternate Level Stairs-Number of Steps: 10-12 Discharge Home Access: Level entry Discharge Bathroom Shower/Tub: Tub/shower unit Discharge Bathroom Toilet: Standard Discharge Bathroom Accessibility: Yes How Accessible: Accessible via walker Does the patient have any problems obtaining your medications?: No   Social/Family/Support Systems Anticipated Caregiver: Hulan Saas (wife) and other family Anticipated Caregiver's Contact Information: [name]: [phone] Caregiver Availability: 24/7 Discharge Plan Discussed with Primary Caregiver: Yes Is Caregiver In Agreement with Plan?: Yes Does Caregiver/Family have Issues with  Lodging/Transportation while Pt is in Rehab?: No   Goals Patient/Family Goal for Rehab: Supervision-Min A: PT/OT Expected length of stay: 16-18 days Pt/Family Agrees to Admission and willing to participate: Yes Program Orientation Provided & Reviewed with Pt/Caregiver Including Roles  & Responsibilities: Yes   Decrease burden of Care through IP rehab admission: NA   Possible need for SNF placement upon discharge: Not anticipated   Patient Condition: I have reviewed medical records from [facility], spoken with CM, and patient and spouse. I met with patient at the bedside and discussed via phone for inpatient rehabilitation assessment.  Patient will benefit from ongoing PT and OT, can actively participate in 3 hours of therapy a day 5 days of the week, and can make measurable gains during the admission.  Patient will also benefit from the coordinated team approach during an Inpatient Acute Rehabilitation admission.  The patient  will receive intensive therapy as well as Rehabilitation physician, nursing, social worker, and care management interventions.  Due to bladder management, safety, skin/wound care, disease management, medication administration, pain management, and patient education the patient requires 24 hour a day rehabilitation nursing.  The patient is currently mod assist overall with mobility and basic ADLs.  Discharge setting and therapy post discharge at home with home health is anticipated.  Patient has agreed to participate in the Acute Inpatient Rehabilitation Program and will admit today.   Preadmission Screen Completed By:  Domingo Pulse, 08/27/2022 10:45 AM ______________________________________________________________________   Discussed status with Dr. Berline Chough on 08/27/22 at 1045 and received approval for admission today.   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 Yes Co-Morbidities requiring supervision/potential complications: central cord syndrome, neurogenic bowel and bladder, syncope; chronic hep C Due to bladder management, bowel management, safety, skin/wound care, disease management, medication administration, pain management, and patient education, does the patient require 24 hr/day rehab nursing? Yes Does the patient require coordinated care of a physician, rehab nurse, PT, OT, and SLP to address physical and functional deficits in the context of the above medical diagnosis(es)? Yes Addressing deficits in the following areas: balance, endurance, locomotion, strength, transferring, bowel/bladder control, bathing, dressing, feeding, grooming, and toileting Can the patient actively participate in an intensive therapy program of at least 3 hrs of therapy 5 days a week? Yes The potential for patient to make measurable gains while on inpatient rehab is good Anticipated functional outcomes upon discharge from inpatient rehab: supervision and min assist PT, supervision and min assist OT, n/a SLP Estimated rehab length of stay to reach the above functional goals is: 16-20 days Anticipated discharge destination: Home 10. Overall Rehab/Functional Prognosis: good     MD Signature:           Revision History

## 2022-08-27 NOTE — TOC Transition Note (Signed)
Transition of Care [facility]) - CM/SW Discharge Note   Patient Details  Name: Kenneth Flores MRN: [id number] Date of Birth: [birth year]  [facility] of [facility] CM/SW Contact:  Kermit Balo, RN Phone Number: 08/27/2022, 10:41 AM   Clinical Narrative:    Pt is discharging to CIR today. CM signing off.    Final next level of care: IP Rehab Facility Barriers to Discharge: No Barriers Identified   Patient Goals and CMS Choice CMS Medicare.gov Compare Post Acute Care list provided to:: Patient Choice offered to / list presented to : Patient, Spouse  Discharge Placement                         Discharge Plan and Services Additional resources added to the After Visit Summary for                                       Social Determinants of Health (SDOH) Interventions SDOH Screenings   Food Insecurity: No Food Insecurity (08/19/2022)  Housing: Low Risk  (08/19/2022)  Transportation Needs: No Transportation Needs (08/19/2022)  Utilities: Not At Risk (08/19/2022)  Depression (PHQ2-9): Low Risk  (03/30/2022)  Tobacco Use: High Risk (08/24/2022)     Readmission Risk Interventions     No data to display

## 2022-08-27 NOTE — Progress Notes (Signed)
Report called to CIR nurse. Patient transported in bed to CIR accompanied by RN. Now in 4 midwest Room 7.

## 2022-08-27 NOTE — Progress Notes (Signed)
INPATIENT REHABILITATION ADMISSION NOTE   Arrival Method: Bed     Mental Orientation: Alert and oriented X4   Assessment: done   Skin: intact   IV'S: left arm   Pain: 10/10-medicated   Tubes and Drains: Condom cath  Safety Measures: reviewed with pt and spouse   Vital Signs: done    Height and Weight: done   Rehab Orientation: done   Family: at bedside    Notes: done  Marylu Lund, RN

## 2022-08-27 NOTE — H&P (Addendum)
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 Imaging revealed evidence of significant multilevel cervical degenerative disease with associated disc space collapse and associated spondylosis. Patient with critical spinal stenosis with severe cord compression and cord signal change at C3-4. Severe stenosis at C4-5 and C5-6 without marked cord signal abnormality at these levels. There is some changes posteriorly from C3-C6 with some worrisome findings for some posterior ligamentous injury. There is no evidence of fracture on his CT scan. There is no evidence of malalignment. Neurosurgery consulted and admitted the patient for IV steroids and pain control. On 5/30, he was taken to the OR and underwent C3-4, C4-5, C5-6 anterior cervical discectomy with interbody fusion utilizing interbody cages, local harvested autograft, and anterior plate instrumentation by Dr. Jordan Likes. Motor function improved. Incision healing without signs of infection. Labs stable. Past medical history is significant for chronic hepatitis C; genotype 1a and is s/p Vosevi time 12 weeks. Undetectable viral RNA 01/21/2022. 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    He is complaining of ongoing left arm numbness and discomfort. Had small hard stool yesterday but that's it since admission- no other BM's. Using purewick/condom cath- can feel when needs to go, however cannot control it when goes-when has to go, needs to go immediately;  not sure if empties or not. Prior to admission, had slowed stream, likely had prostate issues. Urine dark- admits might not be drinking enough.     Having burning pain in LUE mainly- especially L hand- gabapentin isn't working enough.    Having a lot of neck pain- feels like massive muscle spasms and feels really tight.  Doesn't think Robaxin is enough-  A pain medicine he received is making him really sleepy- when done, wanted to take a nap.      Lives at home with wife. Smokes Black & Mild cigars>>denies nicotine cravings. Drinking ~two beers daily.   Review of Systems  Constitutional:  Negative for chills and fever.  HENT:  Positive for sore throat. Negative for congestion.   Eyes:  Negative for blurred vision and double vision.  Respiratory:  Negative for cough and shortness of breath.   Cardiovascular:  Negative for chest pain and palpitations.  Gastrointestinal:  Positive for constipation. Negative for nausea and vomiting.  Genitourinary:  Negative for dysuria and urgency.  Musculoskeletal:  Positive for neck pain.  Neurological:  Positive for headaches. Negative for dizziness.  Psychiatric/Behavioral:  Negative for depression. The patient is not nervous/anxious.   All other systems reviewed and are negative.       Past Medical History:  Diagnosis Date   Hepatitis C           Past Surgical History:  Procedure Laterality Date   ANTERIOR CERVICAL DECOMP/DISCECTOMY FUSION N/A 08/21/2022    Procedure: ANTERIOR CERVICAL DECOMPRESSION/DISCECTOMY FUSION CERVICAL THREE-FOUR, CERVICAL FOUR-FIVE, CERVICAL FIVE-SIX;  Surgeon: Julio Sicks, MD;  Location: [facility] OR;  Service: Neurosurgery;  Laterality: N/A;   PELVIC FRACTURE SURGERY Right           Family History  Problem Relation Age of Onset   Hepatitis C Brother      Social History:  reports  that he has been smoking cigars. He has never used smokeless tobacco. He reports that he does not currently use alcohol. He reports that he does not currently use drugs after having used the following drugs: Cocaine and Marijuana. Allergies: No Known Allergies No medications prior to  admission.          Home: Home Living Family/patient expects to be discharged to:: Private residence Living Arrangements: Spouse/significant other, Children (stepson) Available Help at Discharge: Family, Available 24 hours/day Type of Home: House Home Access: Level entry Entrance Stairs-Number of Steps: 1 Home Layout: Two level, 1/2 bath on main level Alternate Level Stairs-Number of Steps: 10-12 Alternate Level Stairs-Rails: Can reach both Bathroom Shower/Tub: Engineer, manufacturing systems: Standard Bathroom Accessibility: Yes Home Equipment: None Additional Comments: Reports 2 falls within past couple weeks; both syncopal episodes when using the bathroom in the middle of the night  Lives With: Spouse, Son   Functional History: Prior Function Prior Level of Function : Independent/Modified Independent Mobility Comments: Works in Animal nutritionist Status:  Mobility: Bed Mobility Overal bed mobility: Needs Assistance Bed Mobility: Supine to Sit, Sit to Supine Rolling: Supervision Sidelying to sit: Max assist, +2 for physical assistance Supine to sit: Min assist Sit to supine: Mod assist General bed mobility comments: increased time with HOB max elevated to turn out to EOB.  Mod A to bring BLE back into bed Transfers Overall transfer level: Needs assistance Equipment used: 2 person hand held assist, Ambulation equipment used (EVA walker) Transfers: Sit to/from Stand, Bed to chair/wheelchair/BSC Sit to Stand: +2 physical assistance, Min assist Bed to/from chair/wheelchair/BSC transfer type:: Step pivot Stand pivot transfers: Min assist, +2 physical assistance Step pivot transfers: +2 physical assistance, Mod assist General transfer comment: Min A +2 with pt interlocking arms with OT and mobility specialist. Transition to EVA walker for ambulation Ambulation/Gait Ambulation/Gait assistance: Min assist, +2 physical assistance Gait Distance (Feet): 25  Feet Assistive device: Fara Boros Gait Pattern/deviations: Step-through pattern, Decreased stride length, Knee hyperextension - left, Narrow base of support, Knee hyperextension - right General Gait Details: Pt able to progress BLE forward with decreased step length and narrow base of support. Minimal foot clearance from the floor but with improved hip flexion. Gait velocity interpretation: <1.31 ft/sec, indicative of household ambulator   ADL: ADL Overall ADL's : Needs assistance/impaired Eating/Feeding: Set up, Sitting Eating/Feeding Details (indicate cue type and reason): Sitting EOB, pt able to self feed ~10 bites with built up grip, but observed to lose grip multiple times. Added theraband to build up grip as well as administered foam block with hole through center to trial. Pt able to bring spoon to mouth with foam cube arount spoon after session. Per wife, she has been feeding him the majority of the time. Toilet Transfer: Minimal assistance, +2 for physical assistance, +2 for safety/equipment (EVA walker) Toilet Transfer Details (indicate cue type and reason): with eva walker; cues for base of support Functional mobility during ADLs: Minimal assistance, +2 for physical assistance, +2 for safety/equipment General ADL Comments: Cues for body mechanics throughout   Cognition: Cognition Overall Cognitive Status: Within Functional Limits for tasks assessed Orientation Level: Oriented X4 Cognition Arousal/Alertness: Awake/alert Behavior During Therapy: WFL for tasks assessed/performed Overall Cognitive Status: Within Functional Limits for tasks assessed   Physical Exam: Blood pressure (!) 139/96, pulse 75, temperature 98 F (36.7 C), temperature source Oral, resp. rate 17, height 6' (1.829 m), weight 74.8 kg, SpO2 99 %. Physical Exam Vitals and nursing note  reviewed.  Constitutional:      General: He is not in acute distress.    Comments: Sitting up in bed; but keeps trying to rub  neck, in obvious discomfort, keeps stretching, rotating head as well, no acute distress  HENT:     Head: Normocephalic and atraumatic.     Comments: No facial droop Facial sensation intact    Right Ear: External ear normal.     Left Ear: External ear normal.     Nose: Nose normal. No congestion.     Mouth/Throat:     Mouth: Mucous membranes are dry.     Pharynx: Oropharynx is clear. No oropharyngeal exudate.  Eyes:     General:        Right eye: No discharge.        Left eye: No discharge.     Extraocular Movements: Extraocular movements intact.     Pupils: Pupils are equal, round, and reactive to light.  Neck:     Comments: Surgical incision covered with steri-strips; + mild edema Severe tightness of B/L neck/upper back/shoulders- rhomboids, splenius capitus, upper traps and levators and scalenes all tight Cardiovascular:     Rate and Rhythm: Normal rate and regular rhythm.     Heart sounds: Normal heart sounds. No murmur heard.    No gallop.  Pulmonary:     Effort: Pulmonary effort is normal. No respiratory distress.     Breath sounds: Normal breath sounds. No wheezing, rhonchi or rales.  Abdominal:     Comments: Soft, NT< however slightly distended; and hypoactive  Musculoskeletal:     Right lower leg: No edema.     Left lower leg: No edema.     Comments: RUE- biceps 4+/5; triceps 4-/5; WE 3+/5; Grip 4-/5; FA 2/5 LUE- biceps 3/5; triceps 2-/5; WE 2-/5; grip 2/5 and FA 1/5 RLE- HF 4/5; KE 4+/5; DF 4/5; PF 4+/5 and EHL 4/5 LLE- HF 4-/5; KE 4/5 DF 4-/5; PF 4/5 and EHL 4-/5   Skin:    General: Skin is warm and dry.     Comments: L forearm IV_ looks OK  Neurological:     Mental Status: He is alert.     Motor: Weakness present.     Comments: Decreased to light touch from C6 downwards- C3,4, 5 appear intact per pt report/testing Is decreased all the way to S2 testing No hoffman's no increased tone and absent DTRs in Ue's and LE's   Psychiatric:        Mood and Affect: Mood  normal.        Behavior: Behavior normal.     Comments: Disbelieving of SCI        Lab Results Last 48 Hours  No results found for this or any previous visit (from the past 48 hour(s)).   Imaging Results (Last 48 hours)  No results found.         Blood pressure (!) 139/96, pulse 75, temperature 98 F (36.7 C), temperature source Oral, resp. rate 17, height 6' (1.829 m), weight 74.8 kg, SpO2 99 %.   Medical Problem List and Plan: 1. 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 Patient is status post three-level anterior cervical decompression and fusion 5/30.              -patient may  shower if cover incision             -ELOS/Goals: ~ 3 weeks- supervision  to min A             Admit to CIR- will need WHO splint LUE- ordered- won't need PRAFOs             -suggest estim- no hx of Cancer 2.  Antithrombotics: -DVT/anticoagulation:  Mechanical:  Antiembolism stockings, knee (TED hose) Bilateral lower extremities Will need Lovenox as soon as allowed by surgery             -antiplatelet therapy: none   3. Pain Management: Tylenol, Flexeril, Norco 5 or 10, Robaxin as needed             -gabapentin 400 mg TID- increased Gabapentin due to nerve pain- added Valium 2 mg q6 hours prn for muscle tightness of neck and lidoderm patches 8pm to 8am   4. Mood/Behavior/Sleep: LCSW to evaluate and provide emotional support             -antipsychotic agents: n/a   5. Neuropsych/cognition: This patient is capable of making decisions on his own behalf.   6. Skin/Wound Care: Routine skin care checks             -monitor surgical incision   7. Fluids/Electrolytes/Nutrition: Routine Is and Os and follow-up chemistries   8: Constipation due to neurogenic bowel: currently Colace 100 mg BID, Miralax prn             -gave 45 cc Sorbitol to get pt to go and will follow with soap suds enema if need be tomorrow   9: History of  chronic hepatitis C: completed treatment; follows with ID   10. Neurogenic bladder with urinary retention- only voiding he's doing is overflow, not true urination -scheduled bladder scans q6 hours and will have pt cathed if volumes >350cc- required cath as soon as came to rehab.              Milinda Antis, PA-C 08/27/2022       I have personally performed a face to face diagnostic evaluation of this patient and formulated the key components of the plan.  Additionally, I have personally reviewed laboratory data, imaging studies, as well as relevant notes and concur with the physician assistant's documentation above.   The patient's status has not changed from the original H&P.  Any changes in documentation from the acute care chart have been noted above.     I spent a total of 86   minutes on total care today- >50% coordination of care- due to prolonged ASIA exam, review of chart, putting in order changes and d/w nursing about bladder scan/need for cath and making order changes from that- also typing up exam, plan.

## 2022-08-28 DIAGNOSIS — S14155D Other incomplete lesion at C5 level of cervical spinal cord, subsequent encounter: Secondary | ICD-10-CM

## 2022-08-28 DIAGNOSIS — K592 Neurogenic bowel, not elsewhere classified: Secondary | ICD-10-CM | POA: Diagnosis not present

## 2022-08-28 DIAGNOSIS — N319 Neuromuscular dysfunction of bladder, unspecified: Secondary | ICD-10-CM

## 2022-08-28 DIAGNOSIS — M792 Neuralgia and neuritis, unspecified: Secondary | ICD-10-CM

## 2022-08-28 LAB — CBC WITH DIFFERENTIAL/PLATELET
Abs Immature Granulocytes: 0.08 10*3/uL — ABNORMAL HIGH (ref 0.00–0.07)
Basophils Absolute: 0 10*3/uL (ref 0.0–0.1)
Basophils Relative: 0 %
Eosinophils Absolute: 0.2 10*3/uL (ref 0.0–0.5)
Eosinophils Relative: 1 %
HCT: 43.7 % (ref 39.0–52.0)
Hemoglobin: 15.7 g/dL (ref 13.0–17.0)
Immature Granulocytes: 1 %
Lymphocytes Relative: 15 %
Lymphs Abs: 1.8 10*3/uL (ref 0.7–4.0)
MCH: 33.5 pg (ref 26.0–34.0)
MCHC: 35.9 g/dL (ref 30.0–36.0)
MCV: 93.4 fL (ref 80.0–100.0)
Monocytes Absolute: 1.7 10*3/uL — ABNORMAL HIGH (ref 0.1–1.0)
Monocytes Relative: 14 %
Neutro Abs: 7.9 10*3/uL — ABNORMAL HIGH (ref 1.7–7.7)
Neutrophils Relative %: 69 %
Platelets: 151 10*3/uL (ref 150–400)
RBC: 4.68 MIL/uL (ref 4.22–5.81)
RDW: 11.8 % (ref 11.5–15.5)
WBC: 11.6 10*3/uL — ABNORMAL HIGH (ref 4.0–10.5)
nRBC: 0 % (ref 0.0–0.2)

## 2022-08-28 LAB — COMPREHENSIVE METABOLIC PANEL
ALT: 30 U/L (ref 0–44)
AST: 25 U/L (ref 15–41)
Albumin: 3.4 g/dL — ABNORMAL LOW (ref 3.5–5.0)
Alkaline Phosphatase: 63 U/L (ref 38–126)
Anion gap: 11 (ref 5–15)
BUN: 27 mg/dL — ABNORMAL HIGH (ref 6–20)
CO2: 26 mmol/L (ref 22–32)
Calcium: 9.1 mg/dL (ref 8.9–10.3)
Chloride: 94 mmol/L — ABNORMAL LOW (ref 98–111)
Creatinine, Ser: 0.84 mg/dL (ref 0.61–1.24)
GFR, Estimated: >60 mL/min (ref >60)
Glucose, Bld: 112 mg/dL — ABNORMAL HIGH (ref 70–99)
Potassium: 4.1 mmol/L (ref 3.5–5.1)
Sodium: 131 mmol/L — ABNORMAL LOW (ref 135–145)
Total Bilirubin: 0.9 mg/dL (ref 0.3–1.2)
Total Protein: 7.2 g/dL (ref 6.5–8.1)

## 2022-08-28 LAB — GLUCOSE, CAPILLARY: Glucose-Capillary: 116 mg/dL — ABNORMAL HIGH (ref 70–99)

## 2022-08-28 MED ORDER — BISACODYL 10 MG RE SUPP
10.0000 mg | Freq: Every day | RECTAL | Status: DC
  Administered 2022-08-28 – 2022-09-01 (×5): 10 mg via RECTAL
  Filled 2022-08-28 (×5): qty 1

## 2022-08-28 MED ORDER — POLYETHYLENE GLYCOL 3350 17 G PO PACK
17.0000 g | PACK | Freq: Every day | ORAL | Status: DC
  Administered 2022-08-29 – 2022-09-16 (×19): 17 g via ORAL
  Filled 2022-08-28 (×19): qty 1

## 2022-08-28 MED ORDER — ENOXAPARIN SODIUM 40 MG/0.4ML IJ SOSY
40.0000 mg | PREFILLED_SYRINGE | INTRAMUSCULAR | Status: DC
  Administered 2022-08-28 – 2022-09-14 (×18): 40 mg via SUBCUTANEOUS
  Filled 2022-08-28 (×18): qty 0.4

## 2022-08-28 NOTE — Plan of Care (Signed)
  Problem: RH Swallowing Goal: LTG Patient will consume least restrictive diet using compensatory strategies with assistance (SLP) Description: LTG:  Patient will consume least restrictive diet using compensatory strategies with assistance (SLP) Flowsheets (Taken 08/28/2022 1607) LTG: Pt Patient will consume least restrictive diet using compensatory strategies with assistance of (SLP): Supervision

## 2022-08-28 NOTE — Progress Notes (Signed)
Occupational Therapy Session Note  Patient Details  Name: Kenneth Flores MRN: [id number] Date of Birth: 11-23-1960  Today's Date: 08/28/2022 OT Individual Time: 1450-1530 OT Individual Time Calculation (min): 40 min    Short Term Goals: Week 1:  OT Short Term Goal 1 (Week 1): Pt will trial AE for use of feeding with Rt hand with Min A OT Short Term Goal 2 (Week 1): Pt will complete LB dressing with AE and/or adaptive methods as necessary with Mod A OT Short Term Goal 3 (Week 1): Pt will complete tub/shower transfer with CGA with LRAD OT Short Term Goal 4 (Week 1): Pt will stand at sink while completing simple grooming task with LRAD with CGA  Skilled Therapeutic Interventions/Progress Updates:  Skilled OT intervention completed with focus on LUE positioning education and A/AA/ROM within functional context. Pt received seated in w/c, soft collar already on, with family present, agreeable to session. Intermittent pain reported in L hand; pre-medicated. OT offered rest breaks and repositioning throughout for pain reduction.  Pt expressed frustration with Lt hand "not working" like he wants it to. Noted resting hand splint in room, with pt stating he does not prefer wearing it. Education provided on importance of increasing his tolerance with wear of the splint for contracture prevention and pain management. OT made adjustments to the splint to increase comfort; mod A needed to donn, supervision to doff with min difficulty with fine motor on R hand during strap release. Encouraged pt to try 1 hour tonight and progress 30 mins to an hour each night to build his tolerance.  Transported dependently in w/c <> gym for time. Completed 2x10 wrist supination/pronation with compensatory shoulder elevation and internal rotation noted with attempt at pronation. With guided stabilization at L shoulder and cues for relaxation of shoulder for greater advance at wrist, pt able to achieve full ROM of pronation.  Transitioned to simulated self-feeding task with universal cuff donned for scooping in pronated position small pegs on high ledged plate. Min A needed to complete x3 trials. Pt with discomfort of wearing u-cuff therefore removed. Issued green foam block to pt for grasping work.  Back in room, pt remained seated in w/c, with belt alarm on/activated, and with all needs in reach at end of session.   Therapy Documentation Precautions:  Precautions Precautions: Fall, Cervical Precaution Comments: soft collar when OOB Required Braces or Orthoses: Cervical Brace Cervical Brace: Soft collar Restrictions Weight Bearing Restrictions: No   Therapy/Group: Individual Therapy  Melvyn Novas, MS, OTR/L  08/28/2022, 3:56 PM

## 2022-08-28 NOTE — Evaluation (Signed)
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-C7 level [facility])  Past Medical History:  Past Medical History:  Diagnosis Date   Hepatitis C    Past Surgical History:  Past Surgical History:  Procedure Laterality Date   ANTERIOR CERVICAL DECOMP/DISCECTOMY FUSION N/A 08/21/2022   Procedure: ANTERIOR CERVICAL DECOMPRESSION/DISCECTOMY FUSION CERVICAL THREE-FOUR, CERVICAL FOUR-FIVE, CERVICAL FIVE-SIX;  Surgeon: Julio Sicks, MD;  Location: [facility] OR;  Service: Neurosurgery;  Laterality: N/A;   PELVIC FRACTURE SURGERY Right     Assessment / Plan / Recommendation Clinical Impression  Patient is a 62 year old R handed male with hx of chronic Hep C, status post micturition related syncope from vasovagal episode at home on 08/19/2022 resulting in fall with blow to head. Imaging revealed evidence of significant multilevel cervical degenerative disease with associated disc space collapse and associated spondylosis. Patient with critical spinal stenosis with severe cord compression and cord signal change at C3-4. Severe stenosis at C4-5 and C5-6 without marked cord signal abnormality at these levels. There is some changes posteriorly from C3-C6 with some worrisome findings for some posterior ligamentous injury. There is no evidence of fracture on his CT scan. There is no evidence of malalignment. Neurosurgery consulted and admitted the patient for IV steroids and pain control. On 5/30, he was taken to the OR and underwent C3-4, C4-5, C5-6 anterior cervical discectomy with interbody fusion utilizing interbody cages, local harvested autograft, and anterior plate instrumentation by Dr. Jordan Likes.  Motor function improved. Incision healing without signs of infection. Labs stable. Past medical history is significant for chronic hepatitis C; genotype 1a and is s/p Vosevi time 12 weeks. Undetectable viral RNA 01/21/2022. 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    Pt was referred for ST clinical swallow evaluation post ACDF surgery. Pt and NSG staff endorses difficulty with swallowing, specifically this AM with regular solids (bacon). SLP was informed that pt was in bed during breakfast, and pt thought maybe the angle of the bed impacted his swallow. SLP explained that the bed is not a great place to eat meals because it does not have ideal support for meals. Neck Collar was in place throughout today's evaluation. When SLP arrived, NT was in room, setting up for lunch and reported they had downgraded diet due to difficulty swallowing this morning. Pt was observed with solids (broccoli, sweet potatoes, and bbq). Pt did not display any oral or notable pharyngeal impairment during 30 minute assessment. Multiple swallows were observed with liquids; however, no overt s/sx of aspiration were noted. Pt was observed to eat rather quickly; therefore, SLP supplied pt with safe swallow strategies (I.e. smaller bites, putting fork down between bites). Pt agreed his meal was "going down much better" and reported, "maybe it was because of how I was laying this morning". SLP rec pt be OOB for all meals moving forward for optimum positioning. SLP recommends ST therapy services to provide support for safe swallow stategies and ID readiness for diet upgrade. Pt has no questions at this time and is agreeable to cont current diet.     Skilled Therapeutic Interventions  Clinical Evaluation of Swallowing post ACDF  SLP Assessment  Patient will need skilled Speech Lanaguage Pathology Services during CIR  admission    Recommendations  SLP Diet Recommendations: Dysphagia 3 (Mech soft);Thin Liquid Administration via: Straw;Cup Medication Administration: Whole meds with liquid Supervision: Patient able to self feed;Staff to assist with self feeding Compensations: Slow rate;Small sips/bites Postural Changes and/or Swallow Maneuvers: Out of bed for meals;Seated upright 90 degrees Oral Care Recommendations: Oral care BID Patient destination: Home Follow up Recommendations: None Equipment Recommended: None recommended by SLP    SLP Frequency 1 to 3 out of 7 days   SLP Duration  SLP Intensity  SLP Treatment/Interventions 3 weeks  Minumum of 1-2 x/day, 30 to 90 minutes  Cueing hierarchy;Dysphagia/aspiration precaution training;Patient/family education    Pain Pain Assessment Pain Scale: 0-10 Pain Score: 7  Pain Type: Acute pain Pain Location: Arm Pain Orientation: Right;Left Pain Descriptors / Indicators: Aching;Constant Pain Frequency: Constant Pain Onset: On-going Patients Stated Pain Goal: 4 Pain Intervention(s): Medication (See eMAR)  Prior Functioning    Care Tool Care Tool Cognition Ability to hear (with hearing aid or hearing appliances if normally used Ability to hear (with hearing aid or hearing appliances if normally used): 0. Adequate - no difficulty in normal conservation, social interaction, listening to TV   Expression of Ideas and Wants Expression of Ideas and Wants: 4. Without difficulty (complex and basic) - expresses complex messages without difficulty and with speech that is clear and easy to understand   Understanding Verbal and Non-Verbal Content Understanding Verbal and Non-Verbal Content: 4. Understands (complex and basic) - clear comprehension without cues or repetitions  Memory/Recall Ability Memory/Recall Ability : Current season;That he or she is in a hospital/hospital unit;Staff names and faces   PMSV Assessment  PMSV Trial    Bedside Swallowing  Assessment General Date of Onset: 08/28/22 Diet Prior to this Study: Regular;Thin liquids (Level 0);Other (Comment) (NSG downgraded prior to SLP arrival to dys3) Behavior/Cognition: Alert;Cooperative Oral Cavity - Dentition: Adequate natural dentition Self-Feeding Abilities: Needs set up Patient Positioning: Upright in chair/Tumbleform Baseline Vocal Quality: Normal Volitional Cough: Strong Volitional Swallow: Able to elicit  Oral Care Assessment   Ice Chips Ice chips: Not tested Thin Liquid Thin Liquid: Within functional limits Presentation: Straw;Self Fed Nectar Thick Nectar Thick Liquid: Not tested Honey Thick Honey Thick Liquid: Not tested Puree Puree: Within functional limits Presentation: Self Fed;Spoon Solid Solid: Within functional limits Presentation: Self Fed;Spoon BSE Assessment Risk for Aspiration Impact on safety and function: Mild aspiration risk Other Related Risk Factors: Other (comment) (Swelling 2/2 to ACDF)  Short Term Goals: Week 1: SLP Short Term Goal 1 (Week 1): Patient will maintain adequate hydration/nutrition without overt s/sx of aspiration for the highest appropriate diet level. SLP Short Term Goal 2 (Week 1): Patient will verbalize safe swallow strategies to increase safety with PO intake.  Refer to Care Plan for Long Term Goals  Recommendations for other services: None   Discharge Criteria: Patient will be discharged from SLP if patient refuses treatment 3 consecutive times without medical reason, if treatment goals not met, if there is a change in medical status, if patient makes no progress towards goals or if patient is discharged from hospital.  The above assessment, treatment plan, treatment alternatives and goals were discussed and mutually agreed upon: by patient  Dorena Bodo 08/28/2022, 3:56 PM

## 2022-08-28 NOTE — Progress Notes (Signed)
RN reports dysphagia with breakfast this morning. Will place SLP consult for swallowing.

## 2022-08-28 NOTE — IPOC Note (Signed)
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 [facility])     Functional Problem List: Nursing Safety, Bowel, Sensory, Skin Integrity, Edema, Endurance, Medication Management, Motor, Pain  PT Balance, Endurance, Motor, Pain, Sensory, Skin Integrity, Perception, Safety  OT Balance, Pain  SLP    TR         Basic ADL's: OT Eating, Grooming, Bathing, Dressing, Toileting     Advanced  ADL's: OT       Transfers: PT Bed Mobility, Bed to Chair, Car  OT Toilet, Tub/Shower     Locomotion: PT Ambulation, Psychologist, prison and probation services, Stairs     Additional Impairments: OT Fuctional Use of Upper Extremity  SLP        TR      Anticipated Outcomes Item Anticipated Outcome  Self Feeding Mod I  Swallowing      Basic self-care  Mod I grooming/UB ADLs, CGA LB ADLs  Toileting  Min A   Bathroom Transfers Supervision  Bowel/Bladder  continent of bowel and bladder  Transfers  Mod Ind slideboard/lateral scoot; Supervision for stand pivot transfers  Locomotion  Supervision with RW for household distances  Communication     Cognition     Pain  less than 4  Safety/Judgment  no falls   Therapy Plan: PT Intensity: Minimum of 1-2 x/day ,45 to 90 minutes PT Frequency: 5 out of 7 days PT Duration Estimated Length of Stay: 3-4 weeks OT Intensity: Minimum of 1-2 x/day, 45 to 90 minutes OT Frequency: 5 out of 7 days OT Duration/Estimated Length of Stay: 3-4 weeks     Team Interventions: Nursing Interventions Patient/Family Education, Medication Management, Psychosocial Support, Bowel Management, Skin Care/Wound Management, Disease Management/Prevention, Pain Management, Discharge Planning  PT interventions Ambulation/gait training, Balance/vestibular training, Cognitive  remediation/compensation, Community reintegration, Discharge planning, Disease management/prevention, DME/adaptive equipment instruction, Functional electrical stimulation, Functional mobility training, Neuromuscular re-education, Pain management, Patient/family education, Psychosocial support, Skin care/wound management, Splinting/orthotics, Stair training, Therapeutic Activities, Therapeutic Exercise, UE/LE Strength taining/ROM, UE/LE Coordination activities, Wheelchair propulsion/positioning  OT Interventions Balance/vestibular training, Functional electrical stimulation, Pain management, Self Care/advanced ADL retraining, Therapeutic Activities, UE/LE Coordination activities, Therapeutic Exercise, Patient/family education, Functional mobility training, Community reintegration, Fish farm manager, Neuromuscular re-education, Splinting/orthotics, UE/LE Strength taining/ROM  SLP Interventions    TR Interventions    SW/CM Interventions Discharge Planning, Psychosocial Support, Patient/Family Education, Disease Management/Prevention   Barriers to Discharge MD  Medical stability  Nursing Decreased caregiver support, Home environment access/layout, Incontinence, Wound Care Home with spouse 2 level with 1/2 bath on main level, bed up flight of ste (10/12) with level entry  PT Home environment access/layout, Inaccessible home environment, Incontinence, Neurogenic Bowel & Bladder    OT Neurogenic Bowel & Bladder    SLP      SW Insurance for SNF coverage, Lack of/limited family support, Home environment access/layout     Team Discharge Planning: Destination: PT-Home ,OT- Home , SLP-  Projected Follow-up: PT-Outpatient PT (pending progress), OT-  Outpatient OT, SLP-  Projected Equipment Needs: PT-Wheelchair (measurements), Wheelchair cushion (measurements), Sliding board, OT- 3 in 1 bedside comode, Tub/shower bench, To be determined, SLP-  Equipment Details: PT-pt would need cushioned  drop arm BSC, custom extra light weight WC (TBD), roho cusion, padded shower seat, RW  (TBD), OT-padded tub shower bench and drop arm commode Patient/family involved in discharge  planning: PT- Patient,  OT- , SLP-   MD ELOS: 3-4 weeks Medical Rehab Prognosis:  Excellent Assessment: The patient has been admitted for CIR therapies with the diagnosis of 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 The team will be addressing functional mobility, strength, stamina, balance, safety, adaptive techniques and equipment, self-care, bowel and bladder mgt, patient and caregiver education, NMR, pain mgt, community reentry. Goals have been set at mod I to min assist with self-care and mod I to supervision with mobility. Anticipated discharge destination is home with family.        See Team Conference Notes for weekly updates to the plan of care

## 2022-08-28 NOTE — Progress Notes (Signed)
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 [facility])   Past Medical History:  Past Medical History:  Diagnosis Date   Hepatitis C    Past Surgical History:  Past Surgical History:  Procedure Laterality Date   ANTERIOR CERVICAL DECOMP/DISCECTOMY FUSION N/A 08/21/2022   Procedure: ANTERIOR CERVICAL DECOMPRESSION/DISCECTOMY FUSION CERVICAL THREE-FOUR, CERVICAL FOUR-FIVE, CERVICAL FIVE-SIX;  Surgeon: Julio Sicks, MD;  Location: [facility] OR;  Service: Neurosurgery;  Laterality: N/A;   PELVIC FRACTURE SURGERY Right     Assessment & Plan Clinical Impression: Patient is a 62 year old R handed male with hx of chronic Hep C, status post micturition related syncope from vasovagal episode at home on 08/19/2022 resulting in fall with blow to head. Imaging revealed evidence of significant multilevel cervical degenerative disease with associated disc space collapse and associated spondylosis. Patient with critical spinal stenosis with severe cord compression and cord signal change at C3-4. Severe stenosis at C4-5 and C5-6 without marked cord signal abnormality at these levels. There is some changes posteriorly from C3-C6 with some worrisome findings for some posterior ligamentous injury. There is no evidence of fracture on his CT scan. There is no evidence of malalignment. Neurosurgery consulted and admitted the patient for IV steroids and pain control. On 5/30, he was taken to the OR and underwent C3-4, C4-5, C5-6 anterior cervical discectomy with  interbody fusion utilizing interbody cages, local harvested autograft, and anterior plate instrumentation by Dr. Jordan Likes. Motor function improved. Incision healing without signs of infection. Labs stable. Past medical history is significant for chronic hepatitis C; genotype 1a and is s/p Vosevi time 12 weeks. Undetectable viral RNA 01/21/2022. 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    He is complaining of ongoing left arm numbness and discomfort. Had small hard stool yesterday but that's it since admission- no other BM's. Using purewick/condom cath- can feel when needs to go, however cannot control it when goes-when has to go, needs to go immediately;  not sure if empties or not. Prior to admission, had slowed stream, likely had prostate issues. Urine dark- admits might not be drinking enough.      Patient currently requires mod with mobility secondary to muscle weakness, muscle joint tightness, and muscle paralysis, decreased cardiorespiratoy endurance, impaired timing and sequencing, unbalanced muscle activation, ataxia, decreased coordination, and decreased motor planning,  , and decreased standing balance, decreased postural control, decreased balance strategies, and difficulty maintaining precautions.  Prior to hospitalization, patient was independent  with mobility and lived with Spouse, Son in a House home.  Home access is 1Stairs to enter.  Patient will benefit from skilled PT intervention to maximize safe functional mobility, minimize fall risk, and decrease caregiver burden for planned discharge home with 24 hour assist.  Anticipate patient will benefit from follow up OP at discharge.  PT - End of Session Activity Tolerance: Tolerates 30+ min activity with multiple rests Endurance Deficit: Yes Endurance Deficit Description: muscular and cardiovascular endurance limited PT  Assessment Rehab Potential (ACUTE/IP ONLY): Good PT Barriers to Discharge: Home environment  access/layout;Inaccessible home environment;Incontinence;Neurogenic Bowel & Bladder PT Patient demonstrates impairments in the following area(s): Balance;Endurance;Motor;Pain;Sensory;Skin Integrity;Perception;Safety PT Transfers Functional Problem(s): Bed Mobility;Bed to Chair;Car PT Locomotion Functional Problem(s): Ambulation;Wheelchair Mobility;Stairs PT Plan PT Intensity: Minimum of 1-2 x/day ,45 to 90 minutes PT Frequency: 5 out of 7 days PT Duration Estimated Length of Stay: 3-4 weeks PT Treatment/Interventions: Ambulation/gait training;Balance/vestibular training;Cognitive remediation/compensation;Community reintegration;Discharge planning;Disease management/prevention;DME/adaptive equipment instruction;Functional electrical stimulation;Functional mobility training;Neuromuscular re-education;Pain management;Patient/family education;Psychosocial support;Skin care/wound management;Splinting/orthotics;Stair training;Therapeutic Activities;Therapeutic Exercise;UE/LE Strength taining/ROM;UE/LE Coordination activities;Wheelchair propulsion/positioning PT Transfers Anticipated Outcome(s): Mod Ind slideboard/lateral scoot; Supervision for stand pivot transfers PT Locomotion Anticipated Outcome(s): Supervision with RW for household distances PT Recommendation Recommendations for Other Services: Neuropsych consult Follow Up Recommendations: Outpatient PT (pending progress) Patient destination: Home Equipment Recommended: Wheelchair (measurements);Wheelchair cushion (measurements);Sliding board Equipment Details: pt would need cushioned drop arm BSC, custom extra light weight WC (TBD), roho cusion, padded shower seat, RW  (TBD)   PT Evaluation Precautions/Restrictions Precautions Precautions: Fall;Cervical Precaution Comments: soft collar when OOB Required Braces or Orthoses: Cervical Brace Cervical  Brace: Soft collar Restrictions Weight Bearing Restrictions: No General   Vital SignsTherapy Vitals Pulse Rate: 77 BP: (!) 139/91 Patient Position (if appropriate): Lying Oxygen Therapy SpO2: 99 % O2 Device: Room Air Patient Activity (if Appropriate): In bed Pain Pain Assessment Pain Scale: 0-10 Pain Score: 10-Worst pain ever (Lt and Rt arms, Lt arm>Rt) Pain Location: Arm Pain Orientation: Right;Left 2nd Pain Site Pain Intervention(s): Medication (See eMAR);Repositioned;Ambulation/increased activity;Relaxation;Distraction;Emotional support Pain Interference Pain Interference Pain Effect on Sleep: 3. Frequently Pain Interference with Therapy Activities: 2. Occasionally Pain Interference with Day-to-Day Activities: 3. Frequently Home Living/Prior Functioning Home Living Available Help at Discharge: Family;Available 24 hours/day Type of Home: House Home Access: Stairs to enter [employer] of Steps: 1 Entrance Stairs-Rails: None Home Layout: Multi-level;Bed/bath upstairs;1/2 bath on main level Alternate Level Stairs-Number of Steps: multi level, ~ 8 steps to go up with bilateral rails Alternate Level Stairs-Rails: Can reach both Bathroom Shower/Tub: Engineer, manufacturing systems: Standard Bathroom Accessibility: Yes Additional Comments: Reports 2 falls PTA to hospital; both syncopal episodes when using the bathroom in the middle of the night  Lives With: Spouse;Son Prior Function Level of Independence: Independent with basic ADLs;Independent with gait;Independent with transfers  Able to Take Stairs?: Yes Driving: Yes Vocation: Full time employment 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 Leisure: Hobbies-yes (Comment) Vision/Perception  Vision - History Baseline Vision: Wears glasses Visual History:  (progressive glasses) Ability to See in Adequate Light: 0 Adequate Patient Visual Report: No change from baseline Vision - Assessment Eye  Alignment: Within Functional Limits Vision Assessment: Vision not tested Perception Perception: Not tested Praxis Praxis: Not tested  Cognition Overall Cognitive Status: Within Functional Limits for tasks assessed Arousal/Alertness: Awake/alert Orientation Level: Oriented X4 Year: 2024 Month: June Day of Week: Correct Attention: Sustained;Selective Sustained Attention: Appears intact Selective Attention: Appears intact Memory: Appears intact Awareness: Appears intact Problem Solving: Appears intact Executive Function: Decision Making;Initiating;Reasoning Reasoning: Appears intact Decision Making: Appears intact Initiating: Appears intact Behaviors: Other (comment) (emotional regarding impairments and weaknesses) Safety/Judgment: Impaired Sensation Sensation Light Touch: Impaired by gross assessment Hot/Cold: Impaired by gross assessment (impaired below knee with more sensation reported above the knee) Proprioception: Appears Intact Stereognosis: Not tested Coordination Gross Motor Movements are Fluid and Coordinated: No Fine Motor Movements are Fluid and Coordinated: No Heel Shin Test: unable to complete due to tetriplegia deficits Motor  Motor Motor: Tetraplegia Motor - Skilled Clinical Observations: Lt LE deficits>Rt LE   Trunk/Postural Assessment  Cervical Assessment Cervical Assessment: Exceptions to [facility] Thoracic Assessment Thoracic Assessment:  (s/p ACDF) Lumbar Assessment Lumbar Assessment: Exceptions to [facility] (flat back) Postural Control Postural Control: Deficits on evaluation Trunk Control: impaired Righting Reactions: delayed Protective Responses: delayed  Balance Balance Balance Assessed: Yes Static Sitting Balance Static Sitting - Balance Support: Feet supported;Bilateral upper extremity supported;No upper extremity supported Static Sitting - Level of Assistance: 5: Stand by assistance Dynamic Sitting Balance Dynamic Sitting - Balance Support: Feet  supported;No upper extremity supported;Bilateral upper extremity supported Dynamic Sitting - Level of Assistance: 4: Min assist Sitting balance - Comments: min assist to stabilize balance with reaching out for cup on table and when coughing causes anterior lean Static Standing Balance Static Standing - Balance Support: During functional activity;Bilateral upper extremity supported Static Standing - Level of Assistance: 4: Min assist Dynamic Standing Balance Dynamic Standing - Balance Support: Bilateral upper extremity supported;During functional activity Dynamic Standing - Level of Assistance: 3: Mod assist Extremity Assessment  RUE Assessment RUE Assessment: Exceptions to [facility] RUE AROM (degrees) Overall AROM Right Upper Extremity: Deficits Right Shoulder Flexion: 40 Degrees Right Elbow Flexion: 80 Right Wrist Extension: 30 Degrees Right Wrist Flexion: 20 Degrees RUE PROM (degrees) Overall PROM Right Upper Extremity: Within functional limits for tasks performed RUE Strength Right Shoulder Flexion: 2-/5 Right Elbow Flexion: 3/5 Right Elbow Extension: 3/5 Right Hand Gross Grasp: Impaired LUE Assessment LUE Assessment: Exceptions to WFL LUE AROM (degrees) Overall AROM Left Upper Extremity: Deficits Left Shoulder Flexion: 30 Degrees Left Elbow Flexion: 40 LUE PROM (degrees) Overall PROM Left Upper Extremity: Deficits LUE Overall PROM Comments: ~100 shoulder flexion PROM LUE Strength LUE Overall Strength: Deficits Left Shoulder Flexion: 2-/5 Left Elbow Flexion: 2+/5 Left Elbow Extension: 2-/5 Left Hand Gross Grasp: Impaired RLE Assessment RLE Assessment: Exceptions to [facility] RLE Strength Right Hip Flexion: 3+/5 Right Knee Flexion: 4-/5 Right Knee Extension: 4/5 Right Ankle Dorsiflexion: 4/5 Right Ankle Plantar Flexion: 4-/5 RLE Tone RLE Tone: Within Functional Limits LLE Assessment LLE Assessment: Exceptions to [facility] LLE Strength Left Hip Flexion: 2+/5 Left Knee Flexion:  3-/5 Left Knee Extension: 3-/5 Left Ankle Dorsiflexion: 2+/5 Left Ankle Plantar Flexion: 2-/5 LLE Tone LLE Tone: Within Functional Limits LLE Tone Comments: some dorsiflexor tone possibly noted at ankle  Care Tool Care Tool Bed Mobility Roll left and right activity   Roll left and right assist level: Moderate Assistance - Patient 50 - 74%    Sit to lying activity   Sit to lying assist level: Moderate Assistance - Patient 50 - 74%    Lying to sitting on side of bed activity   Lying to sitting on side of bed assist level: the ability to move from lying on the back to sitting on the side of the bed with no back support.: Contact Guard/Touching assist     Care Tool Transfers Sit to stand transfer   Sit to stand assist level: Moderate Assistance - Patient 50 - 74%    Chair/bed transfer   Chair/bed transfer assist level: Minimal Assistance - Patient > 75%     Toilet transfer   Assist Level: Minimal Assistance - Patient > 75%    Car transfer   Car transfer assist level: Moderate Assistance - Patient 50 - 74%      Care Tool Locomotion Ambulation   Assist level: Moderate Assistance - Patient 50 - 74% Assistive device: Walker-rolling Max distance: 90  Walk 10 feet activity   Assist level: Moderate Assistance - Patient - 50 - 74% Assistive  device: Walker-rolling   Walk 50 feet with 2 turns activity   Assist level: Moderate Assistance - Patient - 50 - 74% Assistive device: Walker-rolling  Walk 150 feet activity Walk 150 feet activity did not occur: Safety/medical concerns      Walk 10 feet on uneven surfaces activity Walk 10 feet on uneven surfaces activity did not occur: Safety/medical concerns      Stairs Stair activity did not occur: Safety/medical concerns        Walk up/down 1 step activity Walk up/down 1 step or curb (drop down) activity did not occur: Safety/medical concerns      Walk up/down 4 steps activity Walk up/down 4 steps activity did not occur:  Safety/medical concerns      Walk up/down 12 steps activity Walk up/down 12 steps activity did not occur: Safety/medical concerns      Pick up small objects from floor Pick up small object from the floor (from standing position) activity did not occur: Safety/medical concerns      Wheelchair Is the patient using a wheelchair?: Yes Type of Wheelchair: Manual   Wheelchair assist level: Dependent - Patient 0% Max wheelchair distance: >150  Wheel 50 feet with 2 turns activity   Assist Level: Dependent - Patient 0%  Wheel 150 feet activity   Assist Level: Dependent - Patient 0%    Refer to Care Plan for Long Term Goals  SHORT TERM GOAL WEEK 1 PT Short Term Goal 1 (Week 1): pt will initiate self propulsion of WC with bil UE's. PT Short Term Goal 2 (Week 1): pt will begin management of WC features to be able to set up transfers. PT Short Term Goal 3 (Week 1): patient will complete slide board transfer at [facility] level. PT Short Term Goal 4 (Week 1): pt will ambulate 100' with min assist with RW.  Recommendations for other services: None   Skilled Therapeutic Intervention Mobility Bed Mobility Bed Mobility: Rolling Right;Rolling Left;Supine to Sit;Sitting - Scoot to [place] of Bed Rolling Right: Moderate Assistance - Patient 50-74% Rolling Left: Contact Guard/Touching assist Supine to Sit: Contact Guard/Touching assist Sitting - Scoot to Edge of Bed: Contact Guard/Touching assist Sit to Supine: Moderate Assistance - Patient 50-74% Transfers Transfers: Sit to Stand;Stand to Sit;Stand Pivot Transfers Sit to Stand: Moderate Assistance - Patient 50-74% Stand to Sit: Minimal Assistance - Patient > 75% Stand Pivot Transfers: Moderate Assistance - Patient 50 - 74% Stand Pivot Transfer Details: Verbal cues for sequencing;Verbal cues for technique;Verbal cues for safe use of DME/AE;Manual facilitation for weight shifting Transfer (Assistive device): Rolling walker (W/ Lt hand splint) Locomotion   Gait Gait Distance (Feet): 90 Feet Assistive device: Rolling walker (Lt hand splint) Gait Gait Pattern: Impaired Stairs / Additional Locomotion Stairs: No Wheelchair Mobility Wheelchair Mobility: Yes Wheelchair Assistance: Dependent - Patient 0% Wheelchair Parts Management: Needs assistance Distance: >150    PT eval completed addressing rehab process, PT purpose, POC, ELOS, and goals.  Pt received in bed and completed bed mobility and isolated sensation and strength testing. Pt completed supine>sit EOB with CGA and good use of UE's to scoot to edge. Sit<>stand completed with RW and stand Pivot with Mod assist to move bed>chair. Pt became emotional during session regarding current impairments and reflecting on PLOF. Emotional support provided and therapeutic breathing. Pt dependently transported for time management and energy conservation for purpose of evaluation. Pt completed car transfer followed by gait with RW and Lt hand splint due to decreased grip sensation. EOS pt dependently  returned to room and pt left in [facility] with alarm on, call bell in reach, and needs met. See functional navigator for further details regarding functional mobility performance.   Discharge Criteria: Patient will be discharged from PT if patient refuses treatment 3 consecutive times without medical reason, if treatment goals not met, if there is a change in medical status, if patient makes no progress towards goals or if patient is discharged from hospital.  The above assessment, treatment plan, treatment alternatives and goals were discussed and mutually agreed upon: by patient    Wynn Maudlin, DPT Acute Rehabilitation Services Office [phone]  08/28/22 12:49 PM

## 2022-08-28 NOTE — Progress Notes (Addendum)
PROGRESS NOTE   Subjective/Complaints: Pt is uncomfortable. Still having a neck discomfort and a lot of pain in his arms, L>R. Moved his bowels last night after sorbitol. Doesn't have a sense of his bladder being full. Was cathed twice last night. Does sense flatus. Seems to have some bowel sensation. Nurse reports he's struggling swallowing regular solid textures.   ROS: Patient denies fever, rash, sore throat, blurred vision, dizziness, nausea, vomiting, diarrhea, cough, shortness of breath or chest pain,   headache, or mood change.    Objective:   No results found. Recent Labs    08/28/22 0652  WBC 11.6*  HGB 15.7  HCT 43.7  PLT 151   Recent Labs    08/28/22 0652  NA 131*  K 4.1  CL 94*  CO2 26  GLUCOSE 112*  BUN 27*  CREATININE 0.84  CALCIUM 9.1    Intake/Output Summary (Last 24 hours) at 08/28/2022 1206 Last data filed at 08/28/2022 1043 Gross per 24 hour  Intake 118 ml  Output 1445 ml  Net -1327 ml        Physical Exam: Vital Signs Blood pressure (!) 139/91, pulse 77, temperature 98.6 F (37 C), temperature source Oral, resp. rate 16, height 6' (1.829 m), weight 73.9 kg, SpO2 99 %.  General: Alert and oriented x 3, No apparent distress HEENT: Head is normocephalic, atraumatic, PERRLA, EOMI, sclera anicteric, oral mucosa pink and moist, dentition intact, ext ear canals clear,  Neck: surgical incision cdi with steristrips Heart: Reg rate and rhythm. No murmurs rubs or gallops Chest: CTA bilaterally without wheezes, rales, or rhonchi; no distress Abdomen: Soft, non-tender, non-distended, bowel sounds positive. Extremities: No clubbing, cyanosis, or edema. Pulses are 2+ Psych: Pt's affect is appropriate. Pt is cooperative Skin: Clean and intact without signs of breakdown Neuro:  Alert and oriented x 3. Normal insight and awareness. Intact Memory. Normal language and speech. Cranial nerve exam unremarkable.  MMT: RUE 4/5 delt, biceps, 4- triceps, 3WE, 2/5 HI, LUE 2-3/5 deltoids, biceps, triceps and 2-WE, 1+ HI. BLE motor grossly 4/5 HF, KE and 4+ ADF/PF. Decreased LT in both arms. Senses pain and LT in both legs. No abnl resting tone. DTR's absent  Musculoskeletal: Head forward posture, shoulder girdle and posterior neck musculature tight.     Assessment/Plan: 1. Functional deficits which require 3+ hours per day of interdisciplinary therapy in a comprehensive inpatient rehab setting. Physiatrist is providing close team supervision and 24 hour management of active medical problems listed below. Physiatrist and rehab team continue to assess barriers to discharge/monitor patient progress toward functional and medical goals  Care Tool:  Bathing              Bathing assist       Upper Body Dressing/Undressing Upper body dressing        Upper body assist      Lower Body Dressing/Undressing Lower body dressing            Lower body assist       Toileting Toileting    Toileting assist       Transfers Chair/bed transfer  Transfers assist  Locomotion Ambulation   Ambulation assist              Walk 10 feet activity   Assist           Walk 50 feet activity   Assist           Walk 150 feet activity   Assist           Walk 10 feet on uneven surface  activity   Assist           Wheelchair     Assist               Wheelchair 50 feet with 2 turns activity    Assist            Wheelchair 150 feet activity     Assist          Blood pressure (!) 139/91, pulse 77, temperature 98.6 F (37 C), temperature source Oral, resp. rate 16, height 6' (1.829 m), weight 73.9 kg, SpO2 99 %.  Medical Problem List and Plan: 1. 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 Patient is status post three-level anterior cervical  decompression and fusion 5/30.              -patient may  shower if cover incision             -ELOS/Goals: ~ 3 weeks- supervision to min A             -Continue CIR therapies including PT, OT              -suggest estim- no hx of Cancer 2.  Antithrombotics: -DVT/anticoagulation:  Mechanical:  Antiembolism stockings, knee (TED hose) Bilateral lower extremities Checking with NS regarding starting lovenox although he has good lower extremity movement and strength. Should be ambulating more soon.              -antiplatelet therapy: none   3. Pain Management: Tylenol, Flexeril, Norco 5 or 10, Robaxin as needed             -gabapentin just increased to 400 mg TID last night--obsv for response today. May need 600mg  dose  - Valium 2 mg q6 hours prn for muscle tightness of neck - lidoderm patches 8pm to 8am   4. Mood/Behavior/Sleep: LCSW to evaluate and provide emotional support             -antipsychotic agents: n/a   5. Neuropsych/cognition: This patient is capable of making decisions on his own behalf.   6. Skin/Wound Care: Routine skin care checks             -monitor surgical incision   7. Fluids/Electrolytes/Nutrition: Routine Is and Os and follow-up chemistries   8: Constipation due to neurogenic bowel: currently Colace 100 mg BID, Miralax prn             -had results with sorbitol yesterday  -continue maintenance medication. Make miralax daily scheduled starting tomorrow   9: History of chronic hepatitis C: completed treatment; follows with ID   10. Neurogenic bladder with urinary retention-    -timed voiding attempts every 6 hours. Scan patient after voids and have pt cathed if volumes >350cc-   -encouraged up to bsc, toilet, bladder massage and double voiding 11. Post-op dysphagia---due to cervical surgery. Struggling with solids -change to D3 diet for now          Lynnell Jude  Setzer, PA-C 08/27/2022    LOS: 1 days A FACE TO FACE EVALUATION WAS PERFORMED  Ranelle Oyster 08/28/2022, 12:06 PM

## 2022-08-28 NOTE — Discharge Summary (Addendum)
Physician Discharge Summary  Patient ID: Kenneth Flores MRN: [id number] DOB/AGE: 1961-02-04 62 y.o.  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 male with hx of chronic Hep C, status post micturition related syncope from vasovagal episode at home on 08/19/2022 resulting in fall with blow to head. Imaging revealed evidence of significant multilevel cervical degenerative disease with associated disc space collapse and associated spondylosis. Patient with critical spinal stenosis with severe cord compression and cord signal change at C3-4. Severe stenosis at C4-5 and C5-6 without marked cord signal abnormality at these levels. There is some changes posteriorly from C3-C6 with some worrisome findings for some posterior ligamentous injury. There is no evidence of fracture on his CT scan. There is no evidence of malalignment. Neurosurgery consulted and admitted the patient for IV steroids and pain control. On 5/30, he was taken to the OR and underwent C3-4, C4-5, C5-6 anterior cervical discectomy with interbody fusion utilizing interbody cages, local harvested autograft, and anterior plate instrumentation by Dr. Jordan Likes. Motor function improved. Incision healing without signs of infection. Labs  stable. Past medical history is significant for chronic hepatitis C; genotype 1a and is s/p Vosevi time 12 weeks. Undetectable viral RNA 01/21/2022.    Hospital Course: Kenneth Flores was admitted to rehab 08/27/2022 for inpatient therapies to consist of PT, ST and OT at least three hours five days a week. Past admission physiatrist, therapy team and rehab RN have worked together to provide customized collaborative inpatient rehab. Follow-up labs pm 6/07 hyponatremia and BUN of 27, WBC 11.6. He was unable to eat breakfast due to dysphagia and SLP ordered.  Changed to D3 diet. Lovenox started for VTE prophylaxis. Required in and out catheterizations. Timed voids started. Increased gabapentin to 400 mg TID. Neuropsychology evaluation on 6/10. +BM with bowel program. Voiding better. Increased gabapentin again on 6/11 to 600 mg TID to better cover evening and morning pain. Patient reassured that his exam was not only stable but that he even looked a little stronger on 6/13. Dr. Riley Kill talked about the fact that paresthesias/dysesthesias can be variable in their intensity and location. Added kpad for shoulder girdles and menthol cream as well as above meds. Discussed importance of improving his head and shoulder/neck posture in the short term and long term. Voiding continently with low PVRs on 6/10. Bowel function improving, daily Miralax continued. Cymbalta 30 mg added at bedtime for neck pain as well as Lidoderm patch. Neuropsychology consult obtained 6/10. Had BM on his own on 6/14. Trigger point injection to traps on 6/20>>no positive effect. No further urinary retention>>bladder scans discontinued. Will discharge home on Eliquis 2.5 mg BID for one month. Lidoderm patches discontinued due to ineffectiveness. Tolerating regular diet.   Blood pressures were monitored on TID  basis and were elevated on review 6/09. Started hydrochlorothiazide 25 mg daily. Lisinopril 10 mg daily added on 6/10.   Rehab course: During  patient's stay in rehab weekly team conferences were held to monitor patient's progress, set goals and discuss barriers to discharge. At admission, patient required max with basic self-care skills and mod with mobility.  He has had improvement in activity tolerance, balance, postural control as well as ability to compensate for deficits. He has had improvement in functional use RUE/LUE  and RLE/LLE as well as improvement in awareness.  6/25: OT intervetion with focus on functional amb with SPC and LUE strengthening and GMC control. All amb with SPC at mod I. LUE strengthening/function using [employer]. Pt with improved shoulder abduction and elbow extension. Pt able to grasp handle efficiently. Standing task at [facility] for tracing with LUE. Improved open chain GMC. 6/24: Pt supervision with Single point cane but occasional stumbles that largely do not require assist to recover. Pt performed 4lb farmer carry with and without cane with CGA overall. Outpatient PT and OT arranged at [facility].   Discharge disposition: 01-Home or Self Care     Diet: Regular diet  Special Instructions: No driving, alcohol consumption or tobacco use.  Patient instructed to call PM&R office to request narcotic pain medication refills if needed in 5 days and not to wait until out of medication. He understands also limit of acetaminophen to 4,000 mg.  30-35 minutes were spent on discharge planning and discharge summary.  Discharge Instructions     Ambulatory referral to Occupational Therapy   Complete by: As directed    Eval and treat   Ambulatory referral to Physical Medicine Rehab   Complete by: As directed    Hospital follow-up   Ambulatory referral to Physical Therapy   Complete by: As directed    Eval and treat   Discharge patient   Complete by: As directed    Discharge disposition: 01-Home or Self Care   Discharge patient date: 09/16/2022      Allergies as of 09/16/2022   No Known Allergies       Medication List     TAKE these medications    docusate sodium 100 MG capsule Commonly known as: COLACE Take 1 capsule (100 mg total) by mouth 2 (two) times daily.   DULoxetine 60 MG capsule Commonly known as: CYMBALTA Take 1 capsule (60 mg total) by mouth at bedtime.   Eliquis 2.5 MG Tabs tablet Generic drug: apixaban Take 1 tablet (2.5 mg total) by mouth 2 (two) times daily.   gabapentin 300 MG capsule Commonly known as: NEURONTIN Take 2 capsules (600 mg total) by mouth 3 (three) times daily.   hydrochlorothiazide 25 MG tablet Commonly known as: HYDRODIURIL Take 1 tablet (25 mg total) by mouth daily.   HYDROcodone-acetaminophen 10-325 MG tablet Commonly known as: NORCO Take 1-2 tablets by mouth every 4 (four) hours as needed for severe pain ((score 7 to 10)).   lisinopril 10 MG tablet Commonly known as: ZESTRIL Take 1 tablet (10 mg total) by mouth daily.   melatonin 5 MG Tabs Take 1 tablet (5 mg total) by mouth at bedtime.   methocarbamol 500 MG tablet Commonly known as: ROBAXIN Take 1 tablet (500 mg total) by mouth every 6 (six) hours as needed for muscle spasms.   polyethylene glycol 17 g packet Commonly known as: MIRALAX / GLYCOLAX Take 17 g by mouth daily.        Follow-up Information  Norm Salt, PA Follow up.   Specialty: Physician Assistant Why: Call the office in 1-2 days for hospital follow-up appointment. Contact information: [address] [place] [address] [phone]         Genice Rouge, MD Follow up.   Specialty: Physical Medicine and Rehabilitation Why: office will call you to arrange your appt (sent) Contact information: [address] [place] [address] [phone]         Julio Sicks, MD Follow up.   Specialty: Neurosurgery Why: Call the office in 1-2 days for hospital follow-up appointment. Contact information: [address] [place] [address] [phone]                  Signed: Milinda Antis 09/16/2022, 8:57 AM

## 2022-08-28 NOTE — Evaluation (Signed)
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 [facility])   Past Medical History:  Past Medical History:  Diagnosis Date   Hepatitis C    Past Surgical History:  Past Surgical History:  Procedure Laterality Date   ANTERIOR CERVICAL DECOMP/DISCECTOMY FUSION N/A 08/21/2022   Procedure: ANTERIOR CERVICAL DECOMPRESSION/DISCECTOMY FUSION CERVICAL THREE-FOUR, CERVICAL FOUR-FIVE, CERVICAL FIVE-SIX;  Surgeon: Julio Sicks, MD;  Location: [facility] OR;  Service: Neurosurgery;  Laterality: N/A;   PELVIC FRACTURE SURGERY Right     Assessment & Plan Clinical Impression: Patient is a 62 year old R handed male with hx of chronic Hep C, status post micturition related syncope from vasovagal episode at home on 08/19/2022 resulting in fall with blow to head. Imaging revealed evidence of significant multilevel cervical degenerative disease with associated disc space collapse and associated spondylosis. Patient with critical spinal stenosis with severe cord compression and cord signal change at C3-4. Severe stenosis at C4-5 and C5-6 without marked cord signal abnormality at these levels. There is some changes posteriorly from C3-C6 with some worrisome findings for some posterior ligamentous injury. There is no evidence of fracture on his CT scan. There is no evidence of malalignment. Neurosurgery consulted and admitted the patient for IV steroids and pain control. On 5/30, he was taken to the OR and underwent C3-4, C4-5, C5-6 anterior cervical discectomy with interbody fusion utilizing interbody  cages, local harvested autograft, and anterior plate instrumentation by Dr. Jordan Likes. Motor function improved. Incision healing without signs of infection. Labs stable. Past medical history is significant for chronic hepatitis C; genotype 1a and is s/p Vosevi time 12 weeks. Undetectable viral RNA 01/21/2022. 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    He is complaining of ongoing left arm numbness and discomfort. Had small hard stool yesterday but that's it since admission- no other BM's. Using purewick/condom cath- can feel when needs to go, however cannot control it when goes-when has to go, needs to go immediately;  not sure if empties or not. Prior to admission, had slowed stream, likely had prostate issues. Urine dark- admits might not be drinking enough.    Having burning pain in LUE mainly- especially L hand- gabapentin isn't working enough.    Having a lot of neck pain- feels like massive muscle spasms and feels really tight.  Doesn't think Robaxin is enough-  A pain medicine he received is making him really sleepy- when done, wanted to take a nap. Patient transferred to CIR on 08/27/2022 .    Patient currently requires max with basic self-care skills secondary to muscle weakness, decreased coordination, and decreased sitting balance, decreased standing balance, and decreased balance strategies.  Prior to hospitalization, patient could complete ADLs with independent .  Patient will benefit from skilled intervention to decrease level of assist with basic self-care skills and increase independence with basic self-care skills prior to discharge home with care partner.  Anticipate patient will require intermittent supervision and follow up outpatient.  OT - End of Session Activity Tolerance: Tolerates < 10 min activity, no significant change  in vital signs Endurance Deficit: Yes Endurance  Deficit Description: muscular and cardiovascular endurance limited OT Assessment Rehab Potential (ACUTE ONLY): Good OT Barriers to Discharge: Neurogenic Bowel & Bladder OT Patient demonstrates impairments in the following area(s): Balance;Pain OT Basic ADL's Functional Problem(s): Eating;Grooming;Bathing;Dressing;Toileting OT Transfers Functional Problem(s): Toilet;Tub/Shower OT Additional Impairment(s): Fuctional Use of Upper Extremity OT Plan OT Intensity: Minimum of 1-2 x/day, 45 to 90 minutes OT Frequency: 5 out of 7 days OT Duration/Estimated Length of Stay: 3-4 weeks OT Treatment/Interventions: Balance/vestibular training;Functional electrical stimulation;Pain management;Self Care/advanced ADL retraining;Therapeutic Activities;UE/LE Coordination activities;Therapeutic Exercise;Patient/family education;Functional mobility training;Community reintegration;DME/adaptive equipment instruction;Neuromuscular re-education;Splinting/orthotics;UE/LE Strength taining/ROM OT Self Feeding Anticipated Outcome(s): Mod I OT Basic Self-Care Anticipated Outcome(s): Mod I grooming/UB ADLs, CGA LB ADLs OT Toileting Anticipated Outcome(s): Min A OT Bathroom Transfers Anticipated Outcome(s): Supervision OT Recommendation Recommendations for Other Services: Therapeutic Recreation consult Therapeutic Recreation Interventions: Outing/community reintergration Patient destination: Home Follow Up Recommendations: Outpatient OT Equipment Recommended: 3 in 1 bedside comode;Tub/shower bench;To be determined Equipment Details: padded tub shower bench and drop arm commode  OT Evaluation Precautions/Restrictions  Precautions Precautions: Fall;Cervical Precaution Comments: soft collar when OOB Required Braces or Orthoses: Cervical Brace Cervical Brace: Soft collar Restrictions Weight Bearing Restrictions: No General Chart Reviewed: Yes Family/Caregiver Present: No Vital Signs Therapy Vitals Pulse Rate:  77 BP: (!) 139/91 Patient Position (if appropriate): Lying Oxygen Therapy SpO2: 99 % O2 Device: Room Air Patient Activity (if Appropriate): In bed Pain Pain Assessment Pain Scale: 0-10 Pain Score: 10-Worst pain ever (Lt and Rt arms, Lt arm>Rt) Pain Location: Arm Pain Orientation: Right;Left 2nd Pain Site Pain Intervention(s): Medication (See eMAR);Repositioned;Ambulation/increased activity;Relaxation;Distraction;Emotional support Home Living/Prior Functioning Home Living Living Arrangements: Spouse/significant other, Children Available Help at Discharge: Family, Available 24 hours/day Type of Home: House Home Access: Stairs to enter [employer] of Steps: 1 Entrance Stairs-Rails: None Home Layout: Multi-level, Bed/bath upstairs, 1/2 bath on main level Alternate Level Stairs-Number of Steps: multi level, ~ 8 steps to go up with bilateral rails Alternate Level Stairs-Rails: Can reach both Bathroom Shower/Tub: Armed forces operational officer Accessibility: Yes Additional Comments: Reports 2 falls PTA to hospital; both syncopal episodes when using the bathroom in the middle of the night  Lives With: Spouse, Son IADL History Current License: Yes Occupation: Full time employment Buyer, retail) Leisure and Hobbies: Electrical engineer Prior Function Level of Independence: Independent with basic ADLs, Independent with gait, Independent with transfers  Able to Take Stairs?: Yes Driving: Yes Vocation: Full time employment 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 Leisure: Hobbies-yes (Comment) Vision Baseline Vision/History: 1 Wears glasses Ability to See in Adequate Light: 0 Adequate Patient Visual Report: No change from baseline Vision Assessment?: No apparent visual deficits Eye Alignment: Within Functional Limits Perception  Perception: Not tested Praxis Praxis: Not tested Cognition Cognition Overall Cognitive Status: Within  Functional Limits for tasks assessed Arousal/Alertness: Awake/alert Orientation Level: Person;Place;Situation Person: Oriented Place: Oriented Situation: Oriented Memory: Appears intact Attention: Sustained;Selective Sustained Attention: Appears intact Selective Attention: Appears intact Awareness: Appears intact Problem Solving: Appears intact Executive Function: Decision Making;Initiating;Reasoning Reasoning: Appears intact Decision Making: Appears intact Initiating: Appears intact Behaviors: Other (comment) (emotional regarding impairments and weaknesses) Safety/Judgment: Impaired Brief Interview for Mental Status (BIMS) Repetition of Three Words (First Attempt): 3 Temporal Orientation: Year: Correct Temporal Orientation: Month: Accurate within 5 days Temporal Orientation: Day: Correct Recall: "Sock": Yes, no cue required Recall: "Blue": Yes, no cue required Recall: "Bed": No, could not recall BIMS Summary Score: 13 Sensation Sensation Light Touch: Impaired by gross assessment  Hot/Cold: Impaired by gross assessment (with eyes occluded, was able to verbalize light touch and deep pressure in LUE, RUE and BUE simultaneously) Proprioception: Appears Intact Stereognosis: Not tested Coordination Gross Motor Movements are Fluid and Coordinated: No Fine Motor Movements are Fluid and Coordinated: No Heel Shin Test: unable to complete due to tetriplegia deficits Motor  Motor Motor: Tetraplegia Motor - Skilled Clinical Observations: Lt LE deficits>Rt LE  Trunk/Postural Assessment  Cervical Assessment Cervical Assessment: Exceptions to [facility] Thoracic Assessment Thoracic Assessment:  (s/p ACDF) Lumbar Assessment Lumbar Assessment: Exceptions to [facility] (flat back) Postural Control Postural Control: Deficits on evaluation Trunk Control: impaired Righting Reactions: delayed Protective Responses: delayed  Balance Balance Balance Assessed: Yes Static Sitting Balance Static  Sitting - Balance Support: Feet supported;Bilateral upper extremity supported;No upper extremity supported Static Sitting - Level of Assistance: 5: Stand by assistance Dynamic Sitting Balance Dynamic Sitting - Balance Support: Feet supported;No upper extremity supported;Bilateral upper extremity supported Dynamic Sitting - Level of Assistance: 4: Min assist Sitting balance - Comments: min assist to stabilize balance  Static Standing Balance Static Standing - Balance Support: During functional activity;Bilateral upper extremity supported Static Standing - Level of Assistance: 4: Min assist Dynamic Standing Balance Dynamic Standing - Balance Support: Bilateral upper extremity supported;During functional activity Dynamic Standing - Level of Assistance: 3: Mod assist Extremity/Trunk Assessment RUE Assessment RUE Assessment: Exceptions to [facility] RUE AROM (degrees) Overall AROM Right Upper Extremity: Deficits Right Shoulder Flexion: 40 Degrees Right Elbow Flexion: 80 Right Wrist Extension: 30 Degrees Right Wrist Flexion: 20 Degrees RUE PROM (degrees) Overall PROM Right Upper Extremity: Within functional limits for tasks performed RUE Strength Right Shoulder Flexion: 2-/5 Right Elbow Flexion: 3/5 Right Elbow Extension: 3/5 Right Hand Gross Grasp: Impaired LUE Assessment LUE Assessment: Exceptions to WFL LUE AROM (degrees) Overall AROM Left Upper Extremity: Deficits Left Shoulder Flexion: 30 Degrees Left Elbow Flexion: 40 LUE PROM (degrees) Overall PROM Left Upper Extremity: Deficits LUE Overall PROM Comments: ~100 shoulder flexion PROM LUE Strength LUE Overall Strength: Deficits Left Shoulder Flexion: 2-/5 Left Elbow Flexion: 2+/5 Left Elbow Extension: 2-/5 Left Hand Gross Grasp: Impaired  Care Tool Care Tool Self Care Eating   Eating Assist Level: Set up assist    Oral Care    Oral Care Assist Level: Moderate Assistance - Patient 50 - 74%    Bathing         Assist  Level: Maximal Assistance - Patient 24 - 49%    Upper Body Dressing(including orthotics)   What is the patient wearing?: Pull over shirt   Assist Level: Moderate Assistance - Patient 50 - 74%    Lower Body Dressing (excluding footwear)   What is the patient wearing?: Pants Assist for lower body dressing: Maximal Assistance - Patient 25 - 49%    Putting on/Taking off footwear   What is the patient wearing?: Ted hose;Socks Assist for footwear: Total Assistance - Patient < 25%       Care Tool Toileting Toileting activity   Assist for toileting: Total Assistance - Patient < 25%     Care Tool Bed Mobility Roll left and right activity   Roll left and right assist level: Minimal Assistance - Patient > 75%    Sit to lying activity   Sit to lying assist level: Moderate Assistance - Patient 50 - 74%    Lying to sitting on side of bed activity   Lying to sitting on side of bed assist level: the ability to move from lying on the  back to sitting on the side of the bed with no back support.: Contact Guard/Touching assist     Care Tool Transfers Sit to stand transfer   Sit to stand assist level: Minimal Assistance - Patient > 75%    Chair/bed transfer   Chair/bed transfer assist level: Minimal Assistance - Patient > 75%     Toilet transfer   Assist Level: Minimal Assistance - Patient > 75%     Care Tool Cognition  Expression of Ideas and Wants Expression of Ideas and Wants: 4. Without difficulty (complex and basic) - expresses complex messages without difficulty and with speech that is clear and easy to understand  Understanding Verbal and Non-Verbal Content Understanding Verbal and Non-Verbal Content: 4. Understands (complex and basic) - clear comprehension without cues or repetitions   Memory/Recall Ability Memory/Recall Ability : Current season;Location of own room;That he or she is in a hospital/hospital unit   Refer to Care Plan for Long Term Goals  SHORT TERM GOAL WEEK  1 OT Short Term Goal 1 (Week 1): Pt will trial AE for use of feeding with Rt hand with Min A OT Short Term Goal 2 (Week 1): Pt will complete LB dressing with AE and/or adaptive methods as necessary with Mod A OT Short Term Goal 3 (Week 1): Pt will complete tub/shower transfer with CGA with LRAD OT Short Term Goal 4 (Week 1): Pt will stand at sink while completing simple grooming task with LRAD with CGA  Recommendations for other services: Therapeutic Recreation  Outing/community reintegration   Skilled Therapeutic Intervention  "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 OT initiated 1;1 evaluation this date. OT rols, purpose and therapy goals discussed with pt. OT completed ADL this date (See levels of assist above). OT educated pt on resting hand splint and wearing schedule d/t receiving delivery during session. OT issued saddle splint for RW on Lt side. Pt with noted increased impairments on Lt arm hand versus right (see ROM/MMT above). Pt educated on AE for use of eating. Pt educated on dressing strategies while in bed. Pt did not display impulsive behaviors and was able to make needs known during session. Pt supine in bed with bed alarm activated, 2 bed rails up, call light within reach and 4Ps assessed.   ADL ADL Eating: Set up (drinking water) Where Assessed-Eating: Bed level Grooming: Moderate assistance Where Assessed-Grooming: Bed level Upper Body Dressing: Moderate assistance (overhead shirt) Where Assessed-Upper Body Dressing: Bed level Lower Body Dressing: Maximal assistance (Pt able to bridge to assist with pants over hips) Where Assessed-Lower Body Dressing: Bed level Mobility  Bed Mobility Bed Mobility: Rolling Right;Rolling Left;Supine to Sit;Sitting - Scoot to [place] of Bed Rolling Right: Moderate Assistance - Patient 50-74% Rolling Left: Contact Guard/Touching assist Supine to Sit: Contact Guard/Touching assist Sitting - Scoot to Edge  of Bed: Contact Guard/Touching assist Sit to Supine: Moderate Assistance - Patient 50-74% (assist with leg management) Transfers Sit to Stand: Moderate Assistance - Patient 50-74% Stand to Sit: Minimal Assistance - Patient > 75%   Discharge Criteria: Patient will be discharged from OT if patient refuses treatment 3 consecutive times without medical reason, if treatment goals not met, if there is a change in medical status, if patient makes no progress towards goals or if patient is discharged from hospital.  The above assessment, treatment plan, treatment alternatives and goals were discussed and mutually agreed upon: by patient  Velia Meyer, OTD, OTR/L  08/28/2022, 12:43  PM  

## 2022-08-28 NOTE — Progress Notes (Signed)
Inpatient Rehabilitation  Patient information reviewed and entered into eRehab system by Chesni Vos Loys Hoselton, OTR/L, Rehab Quality Coordinator.   Information including medical coding, functional ability and quality indicators will be reviewed and updated through discharge.   

## 2022-08-28 NOTE — Progress Notes (Signed)
Inpatient Rehabilitation Center Individual Statement of Services  Patient Name:  Kenneth Flores  Date:  08/28/2022  Welcome to the Inpatient Rehabilitation Center.  Our goal is to provide you with an individualized program based on your diagnosis and situation, designed to meet your specific needs.  With this comprehensive rehabilitation program, you will be expected to participate in at least 3 hours of rehabilitation therapies Monday-Friday, with modified therapy programming on the weekends.  Your rehabilitation program will include the following services:  Physical Therapy (PT), Occupational Therapy (OT), Speech Therapy (ST), 24 hour per day rehabilitation nursing, Therapeutic Recreaction (TR), Neuropsychology, Care Coordinator, Rehabilitation Medicine, Nutrition Services, Pharmacy Services, and Other  Weekly team conferences will be held on Tuesdays to discuss your progress.  Your Inpatient Rehabilitation Care Coordinator will talk with you frequently to get your input and to update you on team discussions.  Team conferences with you and your family in attendance may also be held.  Expected length of stay: 16-18 Days  Overall anticipated outcome: Supervision-Min A   Depending on your progress and recovery, your program may change. Your Inpatient Rehabilitation Care Coordinator will coordinate services and will keep you informed of any changes. Your Inpatient Rehabilitation Care Coordinator's name and contact numbers are listed  below.  The following services may also be recommended but are not provided by the Inpatient Rehabilitation Center:   Home Health Rehabiltiation Services Outpatient Rehabilitation Services    Arrangements will be made to provide these services after discharge if needed.  Arrangements include referral to agencies that provide these services.  Your insurance has been verified to be:   [employer] Your primary doctor is:  Norva Riffle, [place]  Pertinent information will be  shared with your doctor and your insurance company.  Inpatient Rehabilitation Care Coordinator:  Susie Cassette [phone] or ([phone]  Information discussed with and copy given to patient by: Andria Rhein, 08/28/2022, 10:38 AM

## 2022-08-28 NOTE — Plan of Care (Signed)
  Problem: RH Eating Goal: LTG Patient will perform eating w/assist, cues/equip (OT) Description: LTG: Patient will perform eating with assist, with/without cues using equipment (OT) Flowsheets (Taken 08/28/2022 1229) LTG: Pt will perform eating with assistance level of: Independent with assistive device    Problem: RH Grooming Goal: LTG Patient will perform grooming w/assist,cues/equip (OT) Description: LTG: Patient will perform grooming with assist, with/without cues using equipment (OT) Flowsheets (Taken 08/28/2022 1229) LTG: Pt will perform grooming with assistance level of: Independent with assistive device    Problem: RH Bathing Goal: LTG Patient will bathe all body parts with assist levels (OT) Description: LTG: Patient will bathe all body parts with assist levels (OT) Flowsheets (Taken 08/28/2022 1229) LTG: Pt will perform bathing with assistance level/cueing: Supervision/Verbal cueing   Problem: RH Dressing Goal: LTG Patient will perform upper body dressing (OT) Description: LTG Patient will perform upper body dressing with assist, with/without cues (OT). Flowsheets (Taken 08/28/2022 1229) LTG: Pt will perform upper body dressing with assistance level of: Independent with assistive device Goal: LTG Patient will perform lower body dressing w/assist (OT) Description: LTG: Patient will perform lower body dressing with assist, with/without cues in positioning using equipment (OT) Flowsheets (Taken 08/28/2022 1229) LTG: Pt will perform lower body dressing with assistance level of: Contact Guard/Touching assist   Problem: RH Toileting Goal: LTG Patient will perform toileting task (3/3 steps) with assistance level (OT) Description: LTG: Patient will perform toileting task (3/3 steps) with assistance level (OT)  Flowsheets (Taken 08/28/2022 1229) LTG: Pt will perform toileting task (3/3 steps) with assistance level: Minimal Assistance - Patient > 75%   Problem: RH Toilet Transfers Goal: LTG  Patient will perform toilet transfers w/assist (OT) Description: LTG: Patient will perform toilet transfers with assist, with/without cues using equipment (OT) Flowsheets (Taken 08/28/2022 1229) LTG: Pt will perform toilet transfers with assistance level of: Supervision/Verbal cueing   Problem: RH Tub/Shower Transfers Goal: LTG Patient will perform tub/shower transfers w/assist (OT) Description: LTG: Patient will perform tub/shower transfers with assist, with/without cues using equipment (OT) Flowsheets (Taken 08/28/2022 1229) LTG: Pt will perform tub/shower stall transfers with assistance level of: Supervision/Verbal cueing   Problem: RH Furniture Transfers Goal: LTG Patient will perform furniture transfers w/assist (OT/PT) Description: LTG: Patient will perform furniture transfers  with assistance (OT/PT). Flowsheets (Taken 08/28/2022 1229) LTG: Pt will perform furniture transfers with assist:: Supervision/Verbal cueing

## 2022-08-28 NOTE — Progress Notes (Signed)
Messaged Dr. Jordan Likes regarding initiating Lovenox for DVT prophylaxis and he replied we can start this now. I placed order, 40 mg daily.

## 2022-08-28 NOTE — Progress Notes (Addendum)
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)  Past Medical History:  Past Medical History:  Diagnosis Date   Hepatitis C    Past Surgical History:  Past Surgical History:  Procedure Laterality Date   ANTERIOR CERVICAL DECOMP/DISCECTOMY FUSION N/A 08/21/2022   Procedure: ANTERIOR CERVICAL DECOMPRESSION/DISCECTOMY FUSION CERVICAL THREE-FOUR, CERVICAL FOUR-FIVE, CERVICAL FIVE-SIX;  Surgeon: Julio Sicks, MD;  Location: [facility] OR;  Service: Neurosurgery;  Laterality: N/A;   PELVIC FRACTURE SURGERY Right    Social History:  reports that he has been smoking cigars. He has never used smokeless tobacco. He reports that he does not currently use alcohol. He reports that he does not currently use drugs after having used the following drugs: Cocaine and Marijuana.  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 - How often do you need to have someone help you when you read instructions, pamphlets, or other written material from your doctor or pharmacy?: Never Writes: Yes Employment Status: Employed Name of Employer: [employer] Return to Work Plans: yes Marine scientist Issues: n/a Guardian/Conservator: n/a   Abuse/Neglect Abuse/Neglect Assessment Can Be Completed: Yes Physical Abuse: Denies Verbal Abuse: Denies Sexual Abuse: Denies Exploitation of patient/patient's  resources: Denies Self-Neglect: Denies  Patient response to: Social Isolation - How often do you feel lonely or isolated from those around you?: Never  Emotional Status Pt's affect, behavior and adjustment status: Patient pleasant, difficulty with coping Recent Psychosocial Issues: Coping Psychiatric History: n/a Substance Abuse History: n/a  Patient / Family Perceptions, Expectations & Goals Pt/Family understanding of illness & functional limitations: yes Premorbid pt/family roles/activities: Independent, working and driving Anticipated changes in roles/activities/participation: Assistance from spouse and family Pt/family expectations/goals: Pharmacologist: None Premorbid Home Care/DME Agencies: None Transportation available at discharge: spouse able to transport Is the patient able to respond to transportation needs?: Yes In the past 12 months, has lack of transportation kept you from medical appointments or from getting medications?: No In the past 12 months, has lack of transportation kept you from meetings, work, or from getting things needed for daily living?: No Resource referrals recommended: Neuropsychology  Discharge Planning Living Arrangements: Spouse/significant other Support Systems: Spouse/significant other Type of Residence: Private residence Community education officer Resources: Media planner (specify) Financial Resources: Employment Financial Screen Referred: No Living Expenses: Own Money Management: Patient, Spouse Does the patient have any problems obtaining your medications?: No Home Management: Independent Patient/Family Preliminary Plans: Plans to manage or spouse able to assist Care Coordinator Barriers to Discharge: Insurance for SNF coverage, Lack of/limited family support, Home environment access/layout Care Coordinator Anticipated Follow Up Needs: HH/OP Expected length of stay: 16-18 Days  Clinical Impression  Covering  for primary SW, Auria. SW met with patient, introduced self and explained role. Patient anticipating to discharge home with assist from spouse 24/7. Spouse has been working with [facility] for about 4/5 years. No additional questions or concerns.  Andria Rhein 08/28/2022, 1:12 PM

## 2022-08-29 DIAGNOSIS — K592 Neurogenic bowel, not elsewhere classified: Secondary | ICD-10-CM | POA: Diagnosis not present

## 2022-08-29 DIAGNOSIS — G4700 Insomnia, unspecified: Secondary | ICD-10-CM

## 2022-08-29 DIAGNOSIS — E871 Hypo-osmolality and hyponatremia: Secondary | ICD-10-CM

## 2022-08-29 DIAGNOSIS — S14155D Other incomplete lesion at C5 level of cervical spinal cord, subsequent encounter: Secondary | ICD-10-CM | POA: Diagnosis not present

## 2022-08-29 MED ORDER — MELATONIN 5 MG PO TABS
5.0000 mg | ORAL_TABLET | Freq: Every day | ORAL | Status: DC
  Administered 2022-08-29 – 2022-09-15 (×18): 5 mg via ORAL
  Filled 2022-08-29 (×18): qty 1

## 2022-08-29 NOTE — Progress Notes (Signed)
PROGRESS NOTE   Subjective/Complaints:  Pt doing alright, still c/o neck pain and discomfort with swallowing and in his arms L>R. Slept "so-so". Pain seems controlled overall but still having paresthesias in his arms which are bothersome-- but denies any side effects from gabapentin thus far. LBM last night. Urinating ok, didn't need cathing overnight. Denies any other complaints or concerns today.   ROS: as per HPI. Denies CP, SOB, abd pain, N/V/D/C, or any other complaints at this time.     Objective:   No results found. Recent Labs    08/28/22 0652  WBC 11.6*  HGB 15.7  HCT 43.7  PLT 151   Recent Labs    08/28/22 0652  NA 131*  K 4.1  CL 94*  CO2 26  GLUCOSE 112*  BUN 27*  CREATININE 0.84  CALCIUM 9.1    Intake/Output Summary (Last 24 hours) at 08/29/2022 1137 Last data filed at 08/29/2022 0400 Gross per 24 hour  Intake 414 ml  Output 525 ml  Net -111 ml        Physical Exam: Vital Signs Blood pressure (!) 145/93, pulse 75, temperature 98.7 F (37.1 C), temperature source Oral, resp. rate 16, height 6' (1.829 m), weight 73.9 kg, SpO2 100 %.  General: Alert and oriented x 3, No apparent distress HEENT: Head is normocephalic, atraumatic, PERRLA, EOMI, sclera anicteric, oral mucosa pink and moist, dentition intact  Neck: surgical incision cdi with steristrips Heart: Reg rate and rhythm. No murmurs rubs or gallops Chest: CTA bilaterally without wheezes, rales, or rhonchi; no distress Abdomen: Soft, non-tender, non-distended, bowel sounds positive. Extremities: No clubbing, cyanosis, or edema. Pulses are 2+ Psych: Pt's affect is appropriate. Pt is cooperative Skin: Clean and intact without signs of breakdown Neuro: A&O x3 MsK: mild weakness in RUE but fair grip strength, LUE weaker, able to move to gravity.   PRIOR EXAMS: Neuro:  Alert and oriented x 3. Normal insight and awareness. Intact Memory. Normal  language and speech. Cranial nerve exam unremarkable. MMT: RUE 4/5 delt, biceps, 4- triceps, 3WE, 2/5 HI, LUE 2-3/5 deltoids, biceps, triceps and 2-WE, 1+ HI. BLE motor grossly 4/5 HF, KE and 4+ ADF/PF. Decreased LT in both arms. Senses pain and LT in both legs. No abnl resting tone. DTR's absent  Musculoskeletal: Head forward posture, shoulder girdle and posterior neck musculature tight.     Assessment/Plan: 1. Functional deficits which require 3+ hours per day of interdisciplinary therapy in a comprehensive inpatient rehab setting. Physiatrist is providing close team supervision and 24 hour management of active medical problems listed below. Physiatrist and rehab team continue to assess barriers to discharge/monitor patient progress toward functional and medical goals  Care Tool:  Bathing              Bathing assist Assist Level: Maximal Assistance - Patient 24 - 49%     Upper Body Dressing/Undressing Upper body dressing   What is the patient wearing?: Pull over shirt    Upper body assist Assist Level: Moderate Assistance - Patient 50 - 74%    Lower Body Dressing/Undressing Lower body dressing      What is the patient wearing?: Pants  Lower body assist Assist for lower body dressing: Maximal Assistance - Patient 25 - 49%     Toileting Toileting    Toileting assist Assist for toileting: Total Assistance - Patient < 25%     Transfers Chair/bed transfer  Transfers assist     Chair/bed transfer assist level: Minimal Assistance - Patient > 75%     Locomotion Ambulation   Ambulation assist      Assist level: Moderate Assistance - Patient 50 - 74% Assistive device: Walker-rolling Max distance: 90   Walk 10 feet activity   Assist     Assist level: Moderate Assistance - Patient - 50 - 74% Assistive device: Walker-rolling   Walk 50 feet activity   Assist    Assist level: Moderate Assistance - Patient - 50 - 74% Assistive device: Walker-rolling     Walk 150 feet activity   Assist Walk 150 feet activity did not occur: Safety/medical concerns         Walk 10 feet on uneven surface  activity   Assist Walk 10 feet on uneven surfaces activity did not occur: Safety/medical concerns         Wheelchair     Assist Is the patient using a wheelchair?: Yes Type of Wheelchair: Manual    Wheelchair assist level: Dependent - Patient 0% Max wheelchair distance: >150    Wheelchair 50 feet with 2 turns activity    Assist        Assist Level: Dependent - Patient 0%   Wheelchair 150 feet activity     Assist      Assist Level: Dependent - Patient 0%   Blood pressure (!) 145/93, pulse 75, temperature 98.7 F (37.1 C), temperature source Oral, resp. rate 16, height 6' (1.829 m), weight 73.9 kg, SpO2 100 %.  Medical Problem List and Plan: 1. 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 Patient is status post three-level anterior cervical decompression and fusion 5/30.              -patient may  shower if cover incision             -ELOS/Goals: ~ 3 weeks- supervision to min A             -Continue CIR therapies including PT, OT              -suggest estim- no hx of Cancer 2.  Antithrombotics: -DVT/anticoagulation:  Mechanical:  Antiembolism stockings, knee (TED hose) Bilateral lower extremities -08/28/22 Recardo Evangelist spoke with Dr. Jordan Likes, Lovenox 40mg  QD started             -antiplatelet therapy: none   3. Pain Management: Tylenol, Norco 5 or 10, and Robaxin as needed -gabapentin just increased to 400 mg TID last night--obsv for response today. May need 600mg  dose  - Valium 2 mg q6 hours prn for muscle tightness of neck - lidoderm 2 patches 8pm to 8am -08/29/22 still having paresthesias but just had increase in gabapentin; no side effects yet; monitor for effect this weekend, if not then likely will increase   4. Mood/Behavior/Sleep:  LCSW to evaluate and provide emotional support             -antipsychotic agents: n/a -08/29/22 not sleeping well, added melatonin 5mg  QHS; also has trazodone PRN   5. Neuropsych/cognition: This patient is capable of making decisions on his own behalf.   6. Skin/Wound Care: Routine skin care checks             -  monitor surgical incision   7. Fluids/Electrolytes/Nutrition: Routine Is and Os and follow-up chemistries  -08/29/22 Na 131 on yesterday's labs, monitor with weekly labs starting Monday, no changes for now.    8: Constipation due to neurogenic bowel: currently Colace 100 mg BID, Miralax QD, dulcolax 10mg  suppository QD             -had results with sorbitol yesterday -08/29/22 LBM last night, cont regimen   9: History of chronic hepatitis C: completed treatment; follows with ID   10. Neurogenic bladder with urinary retention-   -timed voiding attempts every 6 hours. Scan patient after voids and have pt cathed if volumes >350cc-   -encouraged up to bsc, toilet, bladder massage and double voiding  -08/29/22 no caths overnight, cont to monitor  11. Post-op dysphagia---due to cervical surgery. Struggling with solids -change to D3 diet for now  - cepacol and chloraseptic spray PRN     LOS: 2 days A FACE TO FACE EVALUATION WAS PERFORMED  [address] 08/29/2022, 11:37 AM

## 2022-08-29 NOTE — Progress Notes (Signed)
IP Rehab Bowel Program Documentation   Bowel Program Start time 2000  Dig Stim Indicated? Yes  Dig Stim Prior to Suppository or mini Enema Refused  Output from dig stim: none  Ordered intervention: Suppository Yes , mini enema No ,   Repeat dig stim after Suppository or Mini enema  X refused,  Output? Large   Bowel Program Complete? Yes , handoff given yes  Patient Tolerated? Refused DigStim  Kenneth Severin  RN

## 2022-08-29 NOTE — Progress Notes (Signed)
IP Rehab Bowel Program Documentation   Bowel Program Start time 2000  Dig Stim Indicated? Yes  Dig Stim Prior to Suppository or mini Enema X Refused   Output from dig stim: pt refused  Ordered intervention: Suppository Yes , mini enema No ,   Repeat dig stim after Suppository or Mini enema  X refused,  Output? Small   Bowel Program Complete? Yes , handoff given yes  Patient Tolerated? Suppository but refused DigStim  Dominica Severin   RN

## 2022-08-30 DIAGNOSIS — K592 Neurogenic bowel, not elsewhere classified: Secondary | ICD-10-CM | POA: Diagnosis not present

## 2022-08-30 DIAGNOSIS — S14155D Other incomplete lesion at C5 level of cervical spinal cord, subsequent encounter: Secondary | ICD-10-CM | POA: Diagnosis not present

## 2022-08-30 DIAGNOSIS — I10 Essential (primary) hypertension: Secondary | ICD-10-CM

## 2022-08-30 DIAGNOSIS — G4700 Insomnia, unspecified: Secondary | ICD-10-CM | POA: Diagnosis not present

## 2022-08-30 DIAGNOSIS — E871 Hypo-osmolality and hyponatremia: Secondary | ICD-10-CM | POA: Diagnosis not present

## 2022-08-30 MED ORDER — HYDROCHLOROTHIAZIDE 25 MG PO TABS
25.0000 mg | ORAL_TABLET | Freq: Every day | ORAL | Status: DC
  Administered 2022-08-30 – 2022-09-16 (×18): 25 mg via ORAL
  Filled 2022-08-30 (×18): qty 1

## 2022-08-30 NOTE — Progress Notes (Signed)
PROGRESS NOTE   Subjective/Complaints:  Pt doing well, slept ok. Pain seems controlled overall, has estim on today. LBM this AM. Urinating well, low PVRs. Denies any other complaints or concerns today.   ROS: as per HPI. Denies CP, SOB, abd pain, N/V/D/C, or any other complaints at this time.     Objective:   No results found. Recent Labs    08/28/22 0652  WBC 11.6*  HGB 15.7  HCT 43.7  PLT 151   Recent Labs    08/28/22 0652  NA 131*  K 4.1  CL 94*  CO2 26  GLUCOSE 112*  BUN 27*  CREATININE 0.84  CALCIUM 9.1    Intake/Output Summary (Last 24 hours) at 08/30/2022 1108 Last data filed at 08/30/2022 0400 Gross per 24 hour  Intake --  Output 400 ml  Net -400 ml        Physical Exam: Vital Signs Blood pressure (!) 146/90, pulse 69, temperature 97.7 F (36.5 C), temperature source Oral, resp. rate 18, height 6' (1.829 m), weight 73.9 kg, SpO2 100 %.  General: Alert and oriented x 3, No apparent distress, in w/c HEENT: Head is normocephalic, atraumatic, PERRLA, EOMI, sclera anicteric, oral mucosa pink and moist, dentition intact  Neck: surgical incision cdi with steristrips, soft collar on Heart: Reg rate and rhythm. No murmurs rubs or gallops Chest: CTA bilaterally without wheezes, rales, or rhonchi; no distress Abdomen: Soft, non-tender, non-distended, bowel sounds positive. Extremities: No clubbing, cyanosis, or edema. Pulses are 2+ Psych: Pt's affect is appropriate. Pt is cooperative Skin: Clean and intact without signs of breakdown Neuro: A&O x3 MsK: mild weakness in RUE but fair grip strength, LUE weaker, able to move to gravity. E-stim on LUE  PRIOR EXAMS: Neuro:  Alert and oriented x 3. Normal insight and awareness. Intact Memory. Normal language and speech. Cranial nerve exam unremarkable. MMT: RUE 4/5 delt, biceps, 4- triceps, 3WE, 2/5 HI, LUE 2-3/5 deltoids, biceps, triceps and 2-WE, 1+ HI. BLE  motor grossly 4/5 HF, KE and 4+ ADF/PF. Decreased LT in both arms. Senses pain and LT in both legs. No abnl resting tone. DTR's absent  Musculoskeletal: Head forward posture, shoulder girdle and posterior neck musculature tight.     Assessment/Plan: 1. Functional deficits which require 3+ hours per day of interdisciplinary therapy in a comprehensive inpatient rehab setting. Physiatrist is providing close team supervision and 24 hour management of active medical problems listed below. Physiatrist and rehab team continue to assess barriers to discharge/monitor patient progress toward functional and medical goals  Care Tool:  Bathing              Bathing assist Assist Level: Maximal Assistance - Patient 24 - 49%     Upper Body Dressing/Undressing Upper body dressing   What is the patient wearing?: Pull over shirt    Upper body assist Assist Level: Moderate Assistance - Patient 50 - 74%    Lower Body Dressing/Undressing Lower body dressing      What is the patient wearing?: Pants     Lower body assist Assist for lower body dressing: Maximal Assistance - Patient 25 - 49%     Toileting Toileting  Toileting assist Assist for toileting: Total Assistance - Patient < 25%     Transfers Chair/bed transfer  Transfers assist     Chair/bed transfer assist level: Minimal Assistance - Patient > 75%     Locomotion Ambulation   Ambulation assist      Assist level: Moderate Assistance - Patient 50 - 74% Assistive device: Walker-rolling Max distance: 90   Walk 10 feet activity   Assist     Assist level: Moderate Assistance - Patient - 50 - 74% Assistive device: Walker-rolling   Walk 50 feet activity   Assist    Assist level: Moderate Assistance - Patient - 50 - 74% Assistive device: Walker-rolling    Walk 150 feet activity   Assist Walk 150 feet activity did not occur: Safety/medical concerns         Walk 10 feet on uneven surface   activity   Assist Walk 10 feet on uneven surfaces activity did not occur: Safety/medical concerns         Wheelchair     Assist Is the patient using a wheelchair?: Yes Type of Wheelchair: Manual    Wheelchair assist level: Dependent - Patient 0% Max wheelchair distance: >150    Wheelchair 50 feet with 2 turns activity    Assist        Assist Level: Dependent - Patient 0%   Wheelchair 150 feet activity     Assist      Assist Level: Dependent - Patient 0%   Blood pressure (!) 146/90, pulse 69, temperature 97.7 F (36.5 C), temperature source Oral, resp. rate 18, height 6' (1.829 m), weight 73.9 kg, SpO2 100 %.  Medical Problem List and Plan: 1. 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 Patient is status post three-level anterior cervical decompression and fusion 5/30.              -patient may  shower if cover incision             -ELOS/Goals: ~ 3 weeks- supervision to min A             -Continue CIR therapies including PT, OT              -suggest estim- no hx of Cancer 2.  Antithrombotics: -DVT/anticoagulation:  Mechanical:  Antiembolism stockings, knee (TED hose) Bilateral lower extremities -08/28/22 Recardo Evangelist spoke with Dr. Jordan Likes, Lovenox 40mg  QD started             -antiplatelet therapy: none   3. Pain Management: Tylenol, Norco 5 or 10, and Robaxin as needed -gabapentin just increased to 400 mg TID last night--obsv for response today. May need 600mg  dose  - Valium 2 mg q6 hours prn for muscle tightness of neck - lidoderm 2 patches 8pm to 8am -08/29/22 still having paresthesias but just had increase in gabapentin; no side effects yet; monitor for effect this weekend, if not then likely will increase-- seems to be doing alright today 08/30/22, reassess tomorrow   4. Mood/Behavior/Sleep: LCSW to evaluate and provide emotional support             -antipsychotic agents:  n/a -08/29/22 not sleeping well, added melatonin 5mg  QHS; also has trazodone PRN -08/30/22 sleep improving, cont regimen   5. Neuropsych/cognition: This patient is capable of making decisions on his own behalf.   6. Skin/Wound Care: Routine skin care checks             -  monitor surgical incision   7. Fluids/Electrolytes/Nutrition: Routine Is and Os and follow-up chemistries -08/29/22 Na 131 on yesterday's labs, monitor with weekly labs starting Monday, no changes for now.  -08/30/22 nursing asking if we can get his IV out; will leave in for today, if labs tomorrow look good then could consider removing it   8: Constipation due to neurogenic bowel: currently Colace 100 mg BID, Miralax QD, dulcolax 10mg  suppository QD             -had results with sorbitol yesterday -08/30/22 LBM today, cont regimen   9: History of chronic hepatitis C: completed treatment; follows with ID   10. Neurogenic bladder with urinary retention-   -timed voiding attempts every 6 hours. Scan patient after voids and have pt cathed if volumes >350cc-   -encouraged up to bsc, toilet, bladder massage and double voiding  -6/8-9/24 no caths overnight and low PVRs, cont to monitor  11. Post-op dysphagia---due to cervical surgery. Struggling with solids -change to D3 diet for now  - cepacol and chloraseptic spray PRN  12. Elevated BPs: -08/30/22 pt noted to have elevated BPs, per chart review, it looks like he was remotely on HCTZ 25mg  QD and Lisinopril 40mg  QD; will restart HCTZ 25mg  QD today, monitor for trend of BPs Vitals:   08/27/22 1400 08/27/22 2014 08/28/22 0440 08/28/22 0930  BP: (!) 151/82 (!) 149/90 (!) 146/96 (!) 139/91   08/28/22 1324 08/28/22 2017 08/29/22 0440 08/29/22 1530  BP: (!) 137/93 (!) 141/85 (!) 145/93 (!) 157/102   08/29/22 1927 08/30/22 0423  BP: (!) 146/87 (!) 146/90     13. Tobacco use: no cravings per H&P, encourage cessation on discharge.   LOS: 3 days A FACE TO FACE EVALUATION WAS  PERFORMED  [address] 08/30/2022, 11:08 AM

## 2022-08-30 NOTE — Progress Notes (Signed)
Occupational Therapy Session Note  Patient Details  Name: Kenneth Flores MRN: [id number] Date of Birth: 1961/02/13  Today's Date: 08/30/2022 OT Individual Time: 1330-1430 OT Individual Time Calculation (min): 60 min    Short Term Goals: Week 1:  OT Short Term Goal 1 (Week 1): Pt will trial AE for use of feeding with Rt hand with Min A OT Short Term Goal 2 (Week 1): Pt will complete LB dressing with AE and/or adaptive methods as necessary with Mod A OT Short Term Goal 3 (Week 1): Pt will complete tub/shower transfer with CGA with LRAD OT Short Term Goal 4 (Week 1): Pt will stand at sink while completing simple grooming task with LRAD with CGA  Skilled Therapeutic Interventions/Progress Updates:    OT intervention with focus on LUE pronation/supination, elbow extension, finger flexion/strengthening. Tan theraputty tasks with focus on index finger strengthening/isolation without compensatory strategies. Elbow extension with pronation/supination without compensatory strategies in closed and open chain movements. Pt requires tactile cues for isolated movements. Forced use of LUE to pick up items from table. All stand pivot transfers with CGA. Pt returnd to room. Belt alarm actiated. All needs within reach. Brother present.   Therapy Documentation Precautions:  Precautions Precautions: Fall, Cervical Precaution Comments: soft collar when OOB Required Braces or Orthoses: Cervical Brace Cervical Brace: Soft collar Restrictions Weight Bearing Restrictions: No  Pain:  Pt c/o 5/10 pain in upper traps; meds received prior to therapy  Therapy/Group: Individual Therapy  [name] 08/30/2022, 2:47 PM

## 2022-08-30 NOTE — Progress Notes (Signed)
Occupational Therapy Session Note  Patient Details  Name: Kenneth Flores MRN: [id number] Date of Birth: [birth year]  Today's Date: 08/30/2022 OT Individual Time: 0865-7846 OT Individual Time Calculation (min): 75 min    Short Term Goals: Week 1:  OT Short Term Goal 1 (Week 1): Pt will trial AE for use of feeding with Rt hand with Min A OT Short Term Goal 2 (Week 1): Pt will complete LB dressing with AE and/or adaptive methods as necessary with Mod A OT Short Term Goal 3 (Week 1): Pt will complete tub/shower transfer with CGA with LRAD OT Short Term Goal 4 (Week 1): Pt will stand at sink while completing simple grooming task with LRAD with CGA  Skilled Therapeutic Interventions/Progress Updates:    Pt sitting EOB upon arrival with wife present. Initial focus on DME recommendations and discharge planning with wife present. Wife joined first part of session for demonstration and practiced with TTB. Practiced std toilet tranfser and recommended BSC (see equipment needs.) Pt transitioned to gym for table tasks with LUE-grasp, supination/pronation, finger extension. Saebo placed on LUE wrist/finger extensors at end of session.  Saebo Stim One Unattended 330 pulse width 35 Hz pulse rate On 8 sec/ off 8 sec Ramp up/ down 2 sec Symmetrical Biphasic wave form  Max intensity at 500 Ohm load  OTA returned upon completion of Saebo cycle and removed unit. No adverse reactions noted.  Therapy Documentation Precautions:  Precautions Precautions: Fall, Cervical Precaution Comments: soft collar when OOB Required Braces or Orthoses: Cervical Brace Cervical Brace: Soft collar Restrictions Weight Bearing Restrictions: No  Pain: Pt c/o 6/10 posterior neck pain (muscular); meds admin prior to therapy   Therapy/Group: Individual Therapy  [name] 08/30/2022, 11:14 AM

## 2022-08-30 NOTE — Progress Notes (Signed)
Physical Therapy Session Note  Patient Details  Name: Kenneth Flores MRN: [id number] Date of Birth: 1960-05-10  Today's Date: 08/30/2022 PT Individual Time: 1049-1203 PT Individual Time Calculation (min): 74 min   Short Term Goals: Week 1:  PT Short Term Goal 1 (Week 1): pt will initiate self propulsion of WC with bil UE's. PT Short Term Goal 2 (Week 1): pt will begin management of WC features to be able to set up transfers. PT Short Term Goal 3 (Week 1): patient will complete slide board transfer at [facility] level. PT Short Term Goal 4 (Week 1): pt will ambulate 100' with min assist with RW.  Skilled Therapeutic Interventions/Progress Updates: Pt presents tilting back in TIS and agreeable to therapy.  Pt wheeled to small gym and performed sit to stand transfer w/ min A and amb x 10' to Nu-step.  Pt performed Nu-step at level 3 x 10' and then short seated rest break and 4' at level 4 w/ good maintenance of hip neutral rotation.  Pt performed blocks of sit to stand w/ min A and verbal cues x 8 and then sit to stand w/ RLE on 3 1/2" platform to increase NMR to L LE.  Pt performed standing alternating toe-taps to 3 1/2" platform w/ min A for steadying.  Pt performed sidestepping B directions w/ HHA (PT in front).  Pt encouraged to use L hand w/ sit <> stands w/ visual input for placement and grip.  Pt amb 80' w/ RW and min A.  Pt amb into BR w/ RW and min A, requiring mod A for clothing management, min A for toilet transfer.  Pt remained sitting in w/c w/ chair alarm on and all needs in reach.     Therapy Documentation Precautions:  Precautions Precautions: Fall, Cervical Precaution Comments: soft collar when OOB Required Braces or Orthoses: Cervical Brace Cervical Brace: Soft collar Restrictions Weight Bearing Restrictions: No General:   Vital Signs:  Pain:0/10      Therapy/Group: Individual Therapy  Lucio Edward 08/30/2022, 12:46 PM

## 2022-08-31 DIAGNOSIS — S14155S Other incomplete lesion at C5 level of cervical spinal cord, sequela: Secondary | ICD-10-CM

## 2022-08-31 DIAGNOSIS — M792 Neuralgia and neuritis, unspecified: Secondary | ICD-10-CM | POA: Diagnosis not present

## 2022-08-31 DIAGNOSIS — S14155D Other incomplete lesion at C5 level of cervical spinal cord, subsequent encounter: Secondary | ICD-10-CM | POA: Diagnosis not present

## 2022-08-31 DIAGNOSIS — F4321 Adjustment disorder with depressed mood: Secondary | ICD-10-CM | POA: Diagnosis not present

## 2022-08-31 DIAGNOSIS — N319 Neuromuscular dysfunction of bladder, unspecified: Secondary | ICD-10-CM | POA: Diagnosis not present

## 2022-08-31 DIAGNOSIS — K592 Neurogenic bowel, not elsewhere classified: Secondary | ICD-10-CM | POA: Diagnosis not present

## 2022-08-31 LAB — CBC
HCT: 40.2 % (ref 39.0–52.0)
Hemoglobin: 14.8 g/dL (ref 13.0–17.0)
MCH: 33.9 pg (ref 26.0–34.0)
MCHC: 36.8 g/dL — ABNORMAL HIGH (ref 30.0–36.0)
MCV: 92 fL (ref 80.0–100.0)
Platelets: 176 10*3/uL (ref 150–400)
RBC: 4.37 MIL/uL (ref 4.22–5.81)
RDW: 11.4 % — ABNORMAL LOW (ref 11.5–15.5)
WBC: 9.4 10*3/uL (ref 4.0–10.5)
nRBC: 0 % (ref 0.0–0.2)

## 2022-08-31 LAB — BASIC METABOLIC PANEL
Anion gap: 9 (ref 5–15)
BUN: 18 mg/dL (ref 6–20)
CO2: 28 mmol/L (ref 22–32)
Calcium: 9.1 mg/dL (ref 8.9–10.3)
Chloride: 93 mmol/L — ABNORMAL LOW (ref 98–111)
Creatinine, Ser: 0.9 mg/dL (ref 0.61–1.24)
GFR, Estimated: >60 mL/min (ref >60)
Glucose, Bld: 102 mg/dL — ABNORMAL HIGH (ref 70–99)
Potassium: 3.5 mmol/L (ref 3.5–5.1)
Sodium: 130 mmol/L — ABNORMAL LOW (ref 135–145)

## 2022-08-31 MED ORDER — LISINOPRIL 5 MG PO TABS
10.0000 mg | ORAL_TABLET | Freq: Every day | ORAL | Status: DC
  Administered 2022-08-31 – 2022-09-16 (×17): 10 mg via ORAL
  Filled 2022-08-31 (×17): qty 2

## 2022-08-31 NOTE — Progress Notes (Signed)
Speech Language Pathology Daily Session Note  Patient Details  Name: Kenneth Flores MRN: [id number] Date of Birth: 03/11/1961  Today's Date: 08/31/2022 SLP Individual Time: 0725-0755 SLP Individual Time Calculation (min): 30 min  Short Term Goals: Week 1: SLP Short Term Goal 1 (Week 1): Patient will maintain adequate hydration/nutrition without overt s/sx of aspiration for the highest appropriate diet level. SLP Short Term Goal 2 (Week 1): Patient will verbalize safe swallow strategies to increase safety with PO intake.  Skilled Therapeutic Interventions: Skilled treatment session focused on dysphagia goals. Upon arrival, patient had already consumed his breakfast meal but reported good tolerance without overt coughing or signs of a globus sensation. Patient observed with thin liquids via straw and taking medications whole with thin. Patient utilized multiple swallows and an intermittent subtle throat clear but no overt coughing. Per discussion with patient, patient prefers to stay on Dys. 3 textures at this time for ease of PO intake as well as self-feeding. Discussed possibility of upgraded trials of during next session. Patient left upright in bed with alarm on and all needs within reach. Continue with current plan of care.      Pain Pain Assessment Pain Scale: 0-10 Pain Score: 8  Pain Intervention(s): Medication (See eMAR)  Therapy/Group: Individual Therapy  Arkel Cartwright 08/31/2022, 8:12 AM

## 2022-08-31 NOTE — Care Management (Deleted)
[facility]patient [facility] Individual Statement of Services  Patient Name:  Chirag Krueger  Date:  08/31/2022  Welcome to the [facility]patient [facility].  Our goal is to provide you with an individualized program based on your diagnosis and situation, designed to meet your specific needs.  With this comprehensive rehabilitation program, you will be expected to participate in at least 3 hours of rehabilitation therapies Monday-Friday, with modified therapy programming on the weekends.  Your rehabilitation program will include the following services:  Physical Therapy (PT), Occupational Therapy (OT), 24 hour per day rehabilitation nursing, Therapeutic Recreaction (TR), Psychology, Neuropsychology, Care Coordinator, Rehabilitation Medicine, Nutrition Services, Pharmacy Services, and Other  Weekly team conferences will be held on Tuesdays to discuss your progress.  Your Inpatient Rehabilitation Care Coordinator will talk with you frequently to get your input and to update you on team discussions.  Team conferences with you and your family in attendance may also be held.  Expected length of stay: 3 weeks    Overall anticipated outcome: Independent with Assistive Device  Depending on your progress and recovery, your program may change. Your Inpatient Rehabilitation Care Coordinator will coordinate services and will keep you informed of any changes. Your Inpatient Rehabilitation Care Coordinator's name and contact numbers are listed  below.  The following services may also be recommended but are not provided by the Inpatient Rehabilitation Center:  Driving Evaluations Home Health Rehabiltiation Services Outpatient Rehabilitation Services Vocational Rehabilitation   Arrangements will be made to provide these services after discharge if needed.  Arrangements include referral to agencies that provide these services.  Your insurance has been verified to be:  [employer]  Your primary doctor  is:  Norva Riffle  Pertinent information will be shared with your doctor and your insurance company.  Inpatient Rehabilitation Care Coordinator:  Susie Cassette [phone] or [phone]  Information discussed with and copy given to patient by: Gretchen Short, 08/31/2022, 9:24 AM

## 2022-08-31 NOTE — Progress Notes (Signed)
Physical Therapy Session Note  Patient Details  Name: Kenneth Flores MRN: [id number] Date of Birth: 1960-08-12  Today's Date: 08/31/2022 PT Individual Time: 0900-1000, 1300-1400 PT Individual Time Calculation (min): 60 min, 60 min   Short Term Goals: Week 1:  PT Short Term Goal 1 (Week 1): pt will initiate self propulsion of WC with bil UE's. PT Short Term Goal 2 (Week 1): pt will begin management of WC features to be able to set up transfers. PT Short Term Goal 3 (Week 1): patient will complete slide board transfer at [facility] level. PT Short Term Goal 4 (Week 1): pt will ambulate 100' with min assist with RW.  Skilled Therapeutic Interventions/Progress Updates:    Session 1: Pt seated EOB on arrival. Reports up to 5/10 back pain with activity, premedicated. Rest and positioning provided as needed. Sit to stand with min A-CGA to RW with wrist splint throughout session.   Pt requested to use bathroom. Min A for clothing management. Ambulated to sink with min A and performed hand and oral hygiene with CGA for balance and assist only to return cap to toothpaste.   Pt navigated 3" stairs x 8 with BHR with min A for knee block. Note hyperextension at times. Pt then performed step up plus 3 x 10 BIL with BUE support. Provided tactile feedback to prevent hyperextension on LLE, which was focus of activity.   Pt propelled w/c to/from gym with all 4 extremities for endurance and functional mobility. Pt remained in chair and was left with all needs in reach and alarm active.    Session 2: pt received in bed and agreeable to therapy. No complaint of pain. Pt transported to therapy gym for time management and energy conservation.  Sit to stand with as little as CGA, pt able to manage L wrist splint without assist.    Pt participated in 2 x 90 and 1 x 270 ft with light min A in case of knee buckling, but none noted at this time. Some knee hyperextension noted, L>R, and cues for increased upright posture and  foot clearance, as well as wider BOS.     Pt participated in lateral step downs, alternating, from 6" step in // bars with visual feedback from mirror. Cues for upright posture.  Knee control declines with fatigue, but no true buckle at this time. 4 bouts of ~10 with 2-3 minute rest breaks for muscle recovery.   Pt returned to room and to bed with min a Stand pivot transfer with RW. Bed mobility with min A. Pt was left with all needs in reach and alarm active.   Therapy Documentation Precautions:  Precautions Precautions: Fall, Cervical Precaution Comments: soft collar when OOB Required Braces or Orthoses: Cervical Brace Cervical Brace: Soft collar Restrictions Weight Bearing Restrictions: No General:       Therapy/Group: Individual Therapy  [name] 08/31/2022, 1:34 PM

## 2022-08-31 NOTE — Progress Notes (Signed)
Occupational Therapy Session Note  Patient Details  Name: Kenneth Flores MRN: [id number] Date of Birth: 02/21/1961  Today's Date: 08/31/2022 OT Individual Time: 1105-1200 OT Individual Time Calculation (min): 55 min    Short Term Goals: Week 1:  OT Short Term Goal 1 (Week 1): Pt will trial AE for use of feeding with Rt hand with Min A OT Short Term Goal 2 (Week 1): Pt will complete LB dressing with AE and/or adaptive methods as necessary with Mod A OT Short Term Goal 3 (Week 1): Pt will complete tub/shower transfer with CGA with LRAD OT Short Term Goal 4 (Week 1): Pt will stand at sink while completing simple grooming task with LRAD with CGA  Skilled Therapeutic Interventions/Progress Updates:    Pt resting in w/c upon arrival. OT intervention with focus on continued LUE pronation/supination, shoulder flexion, and elbow extension. Table tasks and open chain tasks reaching to grasp and touch objects. Block practice pronation/supination with LUE resting on table. Emphasis on controled movements with correct body mechanics and reduced recruitment of ancillary muscle groups Open chain task reaching to touch OTA hand 1x3, 1x4, 1x5, 1x6, and 1x10 with rest breaks. Min verbal cues for posture and technique. Pt with improved quality of movements. Saebo unit applied to LUE wrist/finger flexors at end of session.  Saebo Stim One Unattended 330 pulse width 35 Hz pulse rate On 8 sec/ off 8 sec Ramp up/ down 2 sec Symmetrical Biphasic wave form  Max intensity at 500 Ohm load  OTA returned on completion to remove unit. No adverse reactions noted.    Therapy Documentation Precautions:  Precautions Precautions: Fall, Cervical Precaution Comments: soft collar when OOB Required Braces or Orthoses: Cervical Brace Cervical Brace: Soft collar Restrictions Weight Bearing Restrictions: No  Pain:  Pt reports BUE "tingling/numbness" L>R; MD aware     Therapy/Group: Individual Therapy  [name] 08/31/2022, 12:21 PM

## 2022-08-31 NOTE — Progress Notes (Signed)
Patient ID: Kaysan Peixoto, male   DOB: 01/06/1961, 62 y.o.   MRN: [id number]  SW spoke with pt wife to introduce self, explain role, discuss discharge process, and confirm d/c plan. Wife reports that she works during the day, and his son and son's girlfriend will check in throughout the day. Pt will need to be intermittent level to Mod I at time of discharge. She intends to ensure he has all the care needs he will require at time of discharge. SW will follow-up with updates after team conference.   Cecile Sheerer, MSW, LCSWA Office: [phone] Cell: [phone] Fax: [phone]

## 2022-08-31 NOTE — Progress Notes (Signed)
PROGRESS NOTE   Subjective/Complaints:  Pt reports improved strength as well as better control of bowels and bladder. NSL gets in way of using e-stim LUE  ROS: Patient denies fever, rash, sore throat, blurred vision, dizziness, nausea, vomiting, diarrhea, cough, shortness of breath or chest pain, joint or back/neck pain, headache, or mood change.    Objective:   No results found. Recent Labs    08/31/22 0624  WBC 9.4  HGB 14.8  HCT 40.2  PLT 176   Recent Labs    08/31/22 0624  NA 130*  K 3.5  CL 93*  CO2 28  GLUCOSE 102*  BUN 18  CREATININE 0.90  CALCIUM 9.1    Intake/Output Summary (Last 24 hours) at 08/31/2022 1043 Last data filed at 08/31/2022 0948 Gross per 24 hour  Intake 358 ml  Output 400 ml  Net -42 ml        Physical Exam: Vital Signs Blood pressure (!) 126/104, pulse 71, temperature 98.5 F (36.9 C), temperature source Oral, resp. rate 18, height 6' (1.829 m), weight 73.9 kg, SpO2 100 %.  Constitutional: No distress . Vital signs reviewed. HEENT: NCAT, EOMI, oral membranes moist Neck: steristrips Cardiovascular: RRR without murmur. No JVD    Respiratory/Chest: CTA Bilaterally without wheezes or rales. Normal effort    GI/Abdomen: BS +, non-tender, non-distended Ext: no clubbing, cyanosis, or edema Psych: pleasant and cooperative  Skin: Clean and intact without signs of breakdown Neuro: A&O x3 MsK: mild weakness in RUE but fair grip strength, LUE weaker, able to move to gravity. E-stim on LUE  PRIOR EXAMS: Neuro:  Alert and oriented x 3. Normal insight and awareness. Intact Memory. Normal language and speech. Cranial nerve exam unremarkable. MMT: RUE 4/5 delt, biceps, 4- triceps, 3+WE, 3+/5 HI, LUE 3/5 deltoids, biceps, triceps and 2 to 2+ WE, 2+ HI. BLE motor grossly 4/5 HF, KE and 4+ ADF/PF. Decreased LT in both arms. Senses pain and LT in both legs. No abnl resting tone. DTR's absent   Musculoskeletal: Head forward posture, shoulder girdle and posterior neck musculature tight.     Assessment/Plan: 1. Functional deficits which require 3+ hours per day of interdisciplinary therapy in a comprehensive inpatient rehab setting. Physiatrist is providing close team supervision and 24 hour management of active medical problems listed below. Physiatrist and rehab team continue to assess barriers to discharge/monitor patient progress toward functional and medical goals  Care Tool:  Bathing              Bathing assist Assist Level: Maximal Assistance - Patient 24 - 49%     Upper Body Dressing/Undressing Upper body dressing   What is the patient wearing?: Pull over shirt    Upper body assist Assist Level: Moderate Assistance - Patient 50 - 74%    Lower Body Dressing/Undressing Lower body dressing      What is the patient wearing?: Pants     Lower body assist Assist for lower body dressing: Maximal Assistance - Patient 25 - 49%     Toileting Toileting    Toileting assist Assist for toileting: Minimal Assistance - Patient > 75%     Transfers Chair/bed transfer  Transfers  assist     Chair/bed transfer assist level: Minimal Assistance - Patient > 75%     Locomotion Ambulation   Ambulation assist      Assist level: Minimal Assistance - Patient > 75% Assistive device: Walker-rolling Max distance: 80   Walk 10 feet activity   Assist     Assist level: Minimal Assistance - Patient > 75% Assistive device: Walker-rolling   Walk 50 feet activity   Assist    Assist level: Minimal Assistance - Patient > 75% Assistive device: Walker-rolling    Walk 150 feet activity   Assist Walk 150 feet activity did not occur: Safety/medical concerns         Walk 10 feet on uneven surface  activity   Assist Walk 10 feet on uneven surfaces activity did not occur: Safety/medical concerns         Wheelchair     Assist Is the patient  using a wheelchair?: Yes Type of Wheelchair: Manual    Wheelchair assist level: Dependent - Patient 0% Max wheelchair distance: >150    Wheelchair 50 feet with 2 turns activity    Assist        Assist Level: Dependent - Patient 0%   Wheelchair 150 feet activity     Assist      Assist Level: Dependent - Patient 0%   Blood pressure (!) 126/104, pulse 71, temperature 98.5 F (36.9 C), temperature source Oral, resp. rate 18, height 6' (1.829 m), weight 73.9 kg, SpO2 100 %.  Medical Problem List and Plan: 1. 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 Patient is status post three-level anterior cervical decompression and fusion 5/30.              -patient may  shower if cover incision             -ELOS/Goals: ~ 3 weeks- supervision to min A            -Continue CIR therapies including PT, OT, and SLP               -suggest estim- no hx of Cancer 2.  Antithrombotics: -DVT/anticoagulation:   -spoke with Dr. Jordan Likes, Lovenox 40mg  QD started             -antiplatelet therapy: none   3. Pain Management: Tylenol, Norco 5 or 10, and Robaxin as needed -gabapentin just increased to 400 mg TID last night--obsv for response today. May need 600mg  dose  - Valium 2 mg q6 hours prn for muscle tightness of neck - lidoderm 2 patches 8pm to 8am -6/10 improved nerve pain with gabapentin 400mg  tid   4. Mood/Behavior/Sleep: LCSW to evaluate and provide emotional support             -antipsychotic agents: n/a -08/29/22 not sleeping well, added melatonin 5mg  QHS; also has trazodone PRN -08/31/22 sleep improving, cont regimen   5. Neuropsych/cognition: This patient is capable of making decisions on his own behalf.   6. Skin/Wound Care: Routine skin care checks             -monitor surgical incision   7. Fluids/Electrolytes/Nutrition: Routine Is and Os and follow-up chemistries I personally reviewed the patient's  labs today.  -Na+ holding at 130 today, other labs look good--dc NSL   8: Constipation due to neurogenic bowel: currently Colace 100 mg BID, Miralax QD, dulcolax 10mg  suppository QD             -  had results with sorbitol yesterday -08/30/22 LBM today, cont regimen   9: History of chronic hepatitis C: completed treatment; follows with ID   10. Neurogenic bladder with urinary retention-   -timed voiding attempts every 6 hours. Scan patient after voids and have pt cathed if volumes >350cc-   - up to bsc, toilet, bladder massage and double voiding  -6/10 now voiding continently and with low PVR's (122)  11. Post-op dysphagia---due to cervical surgery. Struggling with solids -change to D3 diet for now  - cepacol and chloraseptic spray PRN  12. Elevated BPs: -08/30/22 pt noted to have elevated BPs, per chart review, it looks like he was remotely on HCTZ 25mg  QD and Lisinopril 40mg  QD; will restart HCTZ 25mg  QD today -6/10 HCTZ just restarted--will add 10mg  lisinopril today too Vitals:   08/27/22 2014 08/28/22 0440 08/28/22 0930 08/28/22 1324  BP: (!) 149/90 (!) 146/96 (!) 139/91 (!) 137/93   08/28/22 2017 08/29/22 0440 08/29/22 1530 08/29/22 1927  BP: (!) 141/85 (!) 145/93 (!) 157/102 (!) 146/87   08/30/22 0423 08/30/22 1544 08/30/22 1954 08/31/22 0519  BP: (!) 146/90 (!) 160/99 (!) 166/100 (!) 126/104     13. Tobacco use: no cravings per H&P, encourage cessation on discharge.   LOS: 4 days A FACE TO FACE EVALUATION WAS PERFORMED  Ranelle Oyster 08/31/2022, 10:43 AM

## 2022-08-31 NOTE — Consult Note (Signed)
Neuropsychological Consultation Comprehensive Inpatient Rehab   Patient:   Kenneth Flores   DOB:   1960/10/04  MR Number:  [id number]  Location:  [facility] [address] CENTER B [id number] [place] [address] Dept: [phone] Loc: [phone]           Date of Service:   08/31/2022  Start Time:   10 AM End Time:   11 AM  Provider/Observer:  Arley Phenix, Psy.D.       Clinical Neuropsychologist       Billing Code/Service: [phone]  Reason for Service:    Kenneth Flores is a 62 year old male referred for neuropsychological consultation during his current admission onto the comprehensive inpatient rehabilitation unit.  Patient with a past medical history including chronic hepatitis C.  Patient recently had a syncopal episode due to vasovagal episode at home (08/19/2022).  The patient had a fall when in the bathroom with a blow to his head.  Patient presented to the emergency department and imaging revealed evidence of significant multilevel cervical degenerative disease with associated disc space collapse and associated spondylosis.  Patient with critical spinal stenosis with severe cord compression and cord signal change at C3-4 and severe stenosis at C4-5 and C5-6.  Neurosurgery was consulted and the patient was admitted for IV steroids and pain control.  On 5/30 he was taken to the OR and underwent cervical discectomy with fusion.  Patient had motor function improvement post surgery.  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  The patient was awake and alert and sitting in his wheelchair when I entered the room.  His left arm was in a large brace.  Patient remember the events that led to his cervical injury.  Patient acknowledged times of adjustment  difficulties with depressed mood and in fact had a crying spell while we were discussing the changes that he had experienced.  However, the patient has made significant improvements since initial presentation.  The patient is walking and while his motor function in his leg is weaker and coordination is not great with some somatosensory changes he is able to stand and walk.  The most pronounced residual abnormality is dysfunction for his left arm and wrist.  Patient continues to be motivated for improvement and is expected to be discharged in 4 days after making ongoing and significant improvements.  HPI for the current admission:    HPI: Patient is a 62 year old R handed male with hx of chronic Hep C, status post micturition related syncope from vasovagal episode at home on 08/19/2022 resulting in fall with blow to head. Imaging revealed evidence of significant multilevel cervical degenerative disease with associated disc space collapse and associated spondylosis. Patient with critical spinal stenosis with severe cord compression and cord signal change at C3-4. Severe stenosis at C4-5 and C5-6 without marked cord signal abnormality at these levels. There is some changes posteriorly from C3-C6 with some worrisome findings for some posterior ligamentous injury. There is no evidence of fracture on his CT scan. There is no evidence of malalignment. Neurosurgery consulted and admitted the patient for IV steroids and pain control. On 5/30, he was taken to the OR and underwent C3-4, C4-5, C5-6 anterior cervical discectomy with interbody fusion utilizing interbody cages, local harvested autograft, and anterior plate instrumentation by Dr. Jordan Likes. Motor function improved. Incision healing  without signs of infection. Labs stable. Past medical history is significant for chronic hepatitis C; genotype 1a and is s/p Vosevi time 12 weeks. Undetectable viral RNA 01/21/2022. The patient requires inpatient medicine and  rehabilitation evaluations and services for ongoing dysfunction secondary to incomplete spinal cord injury.   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 his dress was Appropriate and he was Well Groomed and his manners were Appropriate to the situation.  his participation was indicative of Appropriate and Redirectable behaviors.  There were physical disabilities noted.  he displayed an appropriate level of cooperation and motivation.    Interactions:    Active Appropriate  Attention:   abnormal and attention span appeared shorter than expected for age  Memory:   within normal limits; recent and remote memory intact  Visuo-spatial:   not examined  Speech (Volume):  low  Speech:   normal; normal  Thought Process:  Coherent and Relevant  Coherent, Logical, and Oriented  Though Content:  WNL; not suicidal and not homicidal  Orientation:   person, place, time/date, and situation  Judgment:   Good  Planning:   Fair  Affect:    Flat and Lethargic  Mood:    Dysphoric  Insight:   Good  Intelligence:   normal  Psychiatric History:  No prior psychiatric history noted  Family Med/Psych History:  Family History  Problem Relation Age of Onset   Hepatitis C Brother     Impression/DX:   Kenneth Flores is a 63 year old male referred for neuropsychological consultation during his current admission onto the comprehensive inpatient rehabilitation unit.  Patient with a past medical history  including chronic hepatitis C.  Patient recently had a syncopal episode due to vasovagal episode at home (08/19/2022).  The patient had a fall when in the bathroom with a blow to his head.  Patient presented to the emergency department and imaging revealed evidence of significant multilevel cervical degenerative disease with associated disc space collapse and associated spondylosis.  Patient with critical spinal stenosis with severe cord compression and cord signal change at C3-4 and severe stenosis at C4-5 and C5-6.  Neurosurgery was consulted and the patient was admitted for IV steroids and pain control.  On 5/30 he was taken to the OR and underwent cervical discectomy with fusion.  Patient had motor function improvement post surgery.  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  The patient was awake and alert and sitting in his wheelchair when I entered the room.  His left arm was in a large brace.  Patient remember the events that led to his cervical injury.  Patient acknowledged times of adjustment difficulties with depressed mood and in fact had a crying spell while we were discussing the changes that he had experienced.  However, the patient has made significant improvements since initial presentation.  The patient is  walking and while his motor function in his leg is weaker and coordination is not great with some somatosensory changes he is able to stand and walk.  The most pronounced residual abnormality is dysfunction for his left arm and wrist.  Patient continues to be motivated for improvement and is expected to be discharged in 4 days after making ongoing and significant improvements.  Disposition/Plan:  Today we worked on coping and adjustment issues with recent incomplete paraplegia and improvements post neurosurgical  interventions.          Electronically Signed   _______________________ Arley Phenix, Psy.D. Clinical Neuropsychologist

## 2022-09-01 DIAGNOSIS — M792 Neuralgia and neuritis, unspecified: Secondary | ICD-10-CM | POA: Diagnosis not present

## 2022-09-01 DIAGNOSIS — K592 Neurogenic bowel, not elsewhere classified: Secondary | ICD-10-CM | POA: Diagnosis not present

## 2022-09-01 DIAGNOSIS — S14155D Other incomplete lesion at C5 level of cervical spinal cord, subsequent encounter: Secondary | ICD-10-CM | POA: Diagnosis not present

## 2022-09-01 DIAGNOSIS — N319 Neuromuscular dysfunction of bladder, unspecified: Secondary | ICD-10-CM | POA: Diagnosis not present

## 2022-09-01 MED ORDER — GABAPENTIN 300 MG PO CAPS
600.0000 mg | ORAL_CAPSULE | Freq: Three times a day (TID) | ORAL | Status: DC
  Administered 2022-09-01 – 2022-09-16 (×45): 600 mg via ORAL
  Filled 2022-09-01 (×45): qty 2

## 2022-09-01 NOTE — Progress Notes (Signed)
Physical Therapy Session Note  Patient Details  Name: Kenneth Flores MRN: [id number] Date of Birth: 1960/03/30  Today's Date: 09/01/2022 PT Individual Time: 0800-0855 PT Individual Time Calculation (min): 55 min   Short Term Goals: Week 1:  PT Short Term Goal 1 (Week 1): pt will initiate self propulsion of WC with bil UE's. PT Short Term Goal 2 (Week 1): pt will begin management of WC features to be able to set up transfers. PT Short Term Goal 3 (Week 1): patient will complete slide board transfer at [facility] level. PT Short Term Goal 4 (Week 1): pt will ambulate 100' with min assist with RW.  Skilled Therapeutic Interventions/Progress Updates:    Pt recd in bed and agreeable to therapy. Pt reports unrated back pain, relieved with activity modification. Supine>sit with supervision. Pt dressed with set up, except fastening pants button.   Pt ambulated ~215 ft <> gym with RW with wrist splint. Pt with BIL knee instability but no true buckling, intermittent hyperextension. Cues for RW proximity.   Pt transferred in/out of kneeling from standing position with CGA. Performed tall kneeling squats with UE support on blue bench, 4 x 12. Then performed quadruped push ups 3 x 5 for core and UE strength. Cues for increasing abdominal engagement to relieve low back discomfort.   Pt returned to room and to EOB, was left with all needs in reach and alarm active.    Therapy Documentation Precautions:  Precautions Precautions: Fall, Cervical Precaution Comments: soft collar when OOB Required Braces or Orthoses: Cervical Brace Cervical Brace: Soft collar Restrictions Weight Bearing Restrictions: No General:      Therapy/Group: Individual Therapy  [name] 09/01/2022, 9:08 AM

## 2022-09-01 NOTE — Progress Notes (Signed)
Occupational Therapy Session Note  Patient Details  Name: Kenneth Flores MRN: [id number] Date of Birth: 1960-08-11  Today's Date: 09/01/2022 OT Individual Time: 1411-1459 OT Individual Time Calculation (min): 48 min    Short Term Goals: Week 1:  OT Short Term Goal 1 (Week 1): Pt will trial AE for use of feeding with Rt hand with Min A OT Short Term Goal 2 (Week 1): Pt will complete LB dressing with AE and/or adaptive methods as necessary with Mod A OT Short Term Goal 3 (Week 1): Pt will complete tub/shower transfer with CGA with LRAD OT Short Term Goal 4 (Week 1): Pt will stand at sink while completing simple grooming task with LRAD with CGA  Skilled Therapeutic Interventions/Progress Updates:  Pt received from direct handoff of PT for skilled OT session with focus on R [facility]. Pt agreeable to interventions, demonstrating overall pleasant mood. Pt reported 5/10 pain/tingling in BUE. OT offering intermediate rest breaks and positioning suggestions throughout session to address pain/fatigue and maximize participation/safety in session.   -Isolated opposition---focus placed on pincer pinch with 1st/2nd digits, coordination continues to be limited in remainder of digits -Ring pulls--- focus placed on using pincer pinch to retrieve rings from putty -Picking up small blocks and placing into cup--- cuing provided for pronation to drop blocks into cup  Pt returns to bed through stand-step, requiring CGA + RW.  Pt remained resting in bed with all immediate needs met at end of session. Pt continues to be appropriate for skilled OT intervention to promote further functional independence.   Therapy Documentation Precautions:  Precautions Precautions: Fall, Cervical Precaution Comments: soft collar when OOB Required Braces or Orthoses: Cervical Brace Cervical Brace: Soft collar Restrictions Weight Bearing Restrictions: No   Therapy/Group: Individual Therapy  Lou Cal, OTR/L,  MSOT  09/01/2022, 6:28 AM

## 2022-09-01 NOTE — Progress Notes (Signed)
Physical Therapy Session Note  Patient Details  Name: Kenneth Flores MRN: [id number] Date of Birth: [birth year]  Today's Date: 09/01/2022 PT Individual Time: 1340-1410 PT Individual Time Calculation (min): 30 min   Short Term Goals: Week 1:  PT Short Term Goal 1 (Week 1): pt will initiate self propulsion of WC with bil UE's. PT Short Term Goal 2 (Week 1): pt will begin management of WC features to be able to set up transfers. PT Short Term Goal 3 (Week 1): patient will complete slide board transfer at [facility] level. PT Short Term Goal 4 (Week 1): pt will ambulate 100' with min assist with RW.  Skilled Therapeutic Interventions/Progress Updates:    Pt sleeping in chair on arrival but easily roused, No complaint of pain. Pt transported to therapy gym for time management and energy conservation. Stand pivot transfer with RW and CGA.   Pt performed the following exercises to promote core and  LE strength and endurance:  Superset of sit ups to physioball and russian twists with 4 lb med ball.  2 x 10 Sit to stand from hemi height chair with CGA, no UE support.  Pt ambulated 250 ft from day room with CGA with RW and L hand splint. Poor foot clearance noted at times, with one instance of toe catching requiring min A.   Pt returned to room for direct hand off to OT.   Therapy Documentation Precautions:  Precautions Precautions: Fall, Cervical Precaution Comments: soft collar when OOB Required Braces or Orthoses: Cervical Brace Cervical Brace: Soft collar Restrictions Weight Bearing Restrictions: No General:    Therapy/Group: Individual Therapy  [name] 09/01/2022, 1:50 PM

## 2022-09-01 NOTE — Progress Notes (Signed)
Occupational Therapy Session Note  Patient Details  Name: Kenneth Flores MRN: [id number] Date of Birth: February 11, 1961  Today's Date: 09/01/2022 OT Individual Time: 0900-1015 OT Individual Time Calculation (min): 75 min    Short Term Goals: Week 1:  OT Short Term Goal 1 (Week 1): Pt will trial AE for use of feeding with Rt hand with Min A OT Short Term Goal 2 (Week 1): Pt will complete LB dressing with AE and/or adaptive methods as necessary with Mod A OT Short Term Goal 3 (Week 1): Pt will complete tub/shower transfer with CGA with LRAD OT Short Term Goal 4 (Week 1): Pt will stand at sink while completing simple grooming task with LRAD with CGA  Skilled Therapeutic Interventions/Progress Updates:    OT intervention with initial focus on amb with RW and bathing at shower level, followed bu dressing with sit<>stand from TTB. Amb with CGA/min A and min verbal cues for safety. Bathing with min A. LB dressing with min A. Pt able to don one Ted hose without assistance. Pt transitioned to gym. Focus on LUE grasp/release,pronation/supination, and elbow flexion/extension. Pt reports his arms are tired from earlier session. Pt erquired increased tactile cues to reduce compensatory strategies. Pt returned to room and remained in TIS. Belt alarm activated. All needs within reach.   Therapy Documentation Precautions:  Precautions Precautions: Fall, Cervical Precaution Comments: soft collar when OOB Required Braces or Orthoses: Cervical Brace Cervical Brace: Soft collar Restrictions Weight Bearing Restrictions: No   Pain:  Pt c/o "tightness" across posterior upper back and shoulders (traps); repositioned and warm shower   Therapy/Group: Individual Therapy  [name] 09/01/2022, 11:48 AM

## 2022-09-01 NOTE — Progress Notes (Signed)
Patient ID: Kenneth Flores, male   DOB: 12-11-1960, 62 y.o.   MRN: [id number]  SW spoke with pt wife [name] to provide updates from team conference on gains made, d/c date 6/29, and fam education. Fam edu scheduled for Tues (6/25) 1pm-4pm. SW informed will confirm all DME needs closer towards d/c.   Cecile Sheerer, MSW, LCSWA Office: [phone] Cell: [phone] Fax: [phone]

## 2022-09-01 NOTE — Patient Care Conference (Signed)
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 Faith Rogue Social Worker Present: Cecile Sheerer, LCSWA Nurse Present: Vedia Pereyra, RN PT Present: Bernie Covey, PT OT Present: Ardis Rowan, Carlis Abbott, OT SLP Present: Feliberto Gottron, SLP     Current Status/Progress Goal Weekly Team Focus  Bowel/Bladder   pt continent of b/b. LBM 6/10 Q6 bladder scan   Remain continent   Assist prn    Swallow/Nutrition/ Hydration   Dys. 3 textures with thin liquids, intermittent supervision-Mod I for use of swallowing strategies.   Mod I  tolerance of current diet, use of swallowing compensatory strategies, trials of upgraded textures    ADL's   bathing/dressing-min A; functional transfers-min A/CGA; standing balance-min A; initiating use of LUE but with no functional grasp/release, slightly impulsive   supervision overall   BADLs, standing balance, transfers education, safety awareness Barriers:will require 24 hour supervision 2/2 decreased safety awareness and impulsivity    Mobility   CGA STS, min-CGA gait and stairs up to 270 ft, limited by knee hyperextension and fatigue   mod I transfers, supervision gait, CGA stairs  NMR for improved knee control to better navigate stairs in home environment.    Communication                 Safety/Cognition/ Behavioral Observations               Pain   pt c/o neck pain 7-10, prn norco given   <3 on pain scale   Assess pain qshift and prn    Skin   Incision on the R side of neck, Steri strips   Maintain skin intergrity  Assess qshift and prn      Discharge Planning:  Pt will discharge to home. He will need to be intermittent superivison to Mod I at time of discharge as hiss wife works during the day, and the son and his girlfriend are only able to check in throughout the day. Unsure on how often they can check in. Pt wife is prepared to ensure he has the care he needs at discharge. SW will confirm there are no barriers to discharge.   Team Discussion: Incomplete spinal cord lesion at C5-C7. Gaining increased sensation bowel/bladder. Incision without drainage. [place] J collar when OOB. D3/Thin liquid will encourage compensatory swallowing strategies with upgraded textures. Sabo for LUE with limited grasp/release. Can be impulsive. PT limited by hyperextension of knee and fatigue. Will work towards improved control of knee and activity tolerance.  Patient on target to meet rehab goals: yes, will progress towards goals with discharge of 09/19/22  *See Care Plan and progress notes for long and short-term goals.   Revisions to Treatment Plan:  Increase Gabapentin for pain. Monitor labs/VS Teaching Needs: Medications, safety, self care, skin care, gait/transfer training, etc.   Current  Barriers to Discharge: Decreased caregiver support and Home enviroment access/layout  Possible Resolutions to Barriers: Family education, able to navigate stairs, order recommended DME     Medical Summary Current Status: pt with cervical cord myelopathy s/p ACDF. Central cord picture with uppers more involved than lowers. +dysphagia, dysesthetic pain in shoulders and arms. Bowel and bladder function improving  Barriers to Discharge: Medical stability;Uncontrolled  Pain;Neurogenic Bowel & Bladder   Possible Resolutions to Barriers/Weekly Focus: timed voids, bowel program. gabapentin added and titrated for neuropathic pain. SLP advancing diet   Continued Need for Acute Rehabilitation Level of Care: The patient requires daily medical management by a physician with specialized training in physical medicine and rehabilitation for the following reasons: Direction of a multidisciplinary physical rehabilitation program to maximize functional independence : Yes Medical management of patient stability for increased activity during participation in an intensive rehabilitation regime.: Yes Analysis of laboratory values and/or radiology reports with any subsequent need for medication adjustment and/or medical intervention. : Yes   I attest that I was present, lead the team conference, and concur with the assessment and plan of the team.   Jearld Adjutant 09/01/2022, 4:34 PM

## 2022-09-01 NOTE — Progress Notes (Signed)
Occupational Therapy Session Note  Patient Details  Name: Kenneth Flores MRN: [id number] Date of Birth: 09/14/1960  Today's Date: 09/01/2022 OT Individual Time: 0900-1015 OT Individual Time Calculation (min): 75 min    Short Term Goals: Week 1:  OT Short Term Goal 1 (Week 1): Pt will trial AE for use of feeding with Rt hand with Min A OT Short Term Goal 2 (Week 1): Pt will complete LB dressing with AE and/or adaptive methods as necessary with Mod A OT Short Term Goal 3 (Week 1): Pt will complete tub/shower transfer with CGA with LRAD OT Short Term Goal 4 (Week 1): Pt will stand at sink while completing simple grooming task with LRAD with CGA  Skilled Therapeutic Interventions/Progress Updates:   Pt seen for brief session with focus on amb to bathroom for toileting, standing tolerance and balance and transfer off and on toilet with grab bar and commode topper. Pt requesting toileting trial upon OT arrival. OT updated Safety Plan to reflect progress with RW access with min A fading to CGA with RW and L UE walker splint. Pt off and on toilet with CGA, managed clothing with min A up and down. Once seated was able to reach to posterior peri region for hygiene. Pt unable to have BM nor void despite increased time. Trial standing with CGA and cues for stability and safe hand placement for support. Amb to sink and cues for avoiding leaning on sink for LE support. Pt needs min A to open drink container but able to pour into adapted cup with R hand. Left pt with chair alarm set, needs and nurse call button in reach.    Pain: 2/10 L UE with relief resting, and with support    Therapy Documentation Precautions:  Precautions Precautions: Fall, Cervical Precaution Comments: soft collar when OOB Required Braces or Orthoses: Cervical Brace Cervical Brace: Soft collar Restrictions Weight Bearing Restrictions: No    Therapy/Group: Individual Therapy  Vicenta Dunning 09/01/2022, 7:30 AM

## 2022-09-01 NOTE — Progress Notes (Signed)
PROGRESS NOTE   Subjective/Complaints:  Pt says he worked hard with therapy this morning. Pain is around 7/10. It's worst at night and in the morning. Gabapentin has helped. Is emptying bladder. Had a bm with bowel program last night  ROS: Patient denies fever, rash, sore throat, blurred vision, dizziness, nausea, vomiting, diarrhea, cough, shortness of breath or chest pain, headache, or mood change.    Objective:   No results found. Recent Labs    08/31/22 0624  WBC 9.4  HGB 14.8  HCT 40.2  PLT 176   Recent Labs    08/31/22 0624  NA 130*  K 3.5  CL 93*  CO2 28  GLUCOSE 102*  BUN 18  CREATININE 0.90  CALCIUM 9.1    Intake/Output Summary (Last 24 hours) at 09/01/2022 0926 Last data filed at 09/01/2022 0706 Gross per 24 hour  Intake 1052 ml  Output 1400 ml  Net -348 ml        Physical Exam: Vital Signs Blood pressure 115/67, pulse 80, temperature 98.2 F (36.8 C), temperature source Oral, resp. rate 18, height 6' (1.829 m), weight 73.9 kg, SpO2 100 %.  Constitutional: No distress . Vital signs reviewed. HEENT: NCAT, EOMI, oral membranes moist Neck: supple Cardiovascular: RRR without murmur. No JVD    Respiratory/Chest: CTA Bilaterally without wheezes or rales. Normal effort    GI/Abdomen: BS +, non-tender, non-distended Ext: no clubbing, cyanosis, or edema Psych: pleasant and cooperative  Skin: Clean and intact without signs of breakdown Neuro:  Alert and oriented x 3. Normal insight and awareness. Intact Memory. Normal language and speech. Cranial nerve exam unremarkable. MMT: RUE 4/5 delt, biceps, 4- triceps, 3+WE, 3+/5 HI. LUE 3/5 deltoids, biceps, triceps and  2+ to 3- WE, 2+ HI. BLE motor grossly 4/5 HF, KE and 4+ ADF/PF. Decreased LT in both arms. Senses pain and LT in both legs. No abnl resting tone. DTR's absent  Musculoskeletal: Head forward posture, shoulder girdle and posterior neck musculature  tight.     Assessment/Plan: 1. Functional deficits which require 3+ hours per day of interdisciplinary therapy in a comprehensive inpatient rehab setting. Physiatrist is providing close team supervision and 24 hour management of active medical problems listed below. Physiatrist and rehab team continue to assess barriers to discharge/monitor patient progress toward functional and medical goals  Care Tool:  Bathing              Bathing assist Assist Level: Maximal Assistance - Patient 24 - 49%     Upper Body Dressing/Undressing Upper body dressing   What is the patient wearing?: Pull over shirt    Upper body assist Assist Level: Moderate Assistance - Patient 50 - 74%    Lower Body Dressing/Undressing Lower body dressing      What is the patient wearing?: Pants     Lower body assist Assist for lower body dressing: Maximal Assistance - Patient 25 - 49%     Toileting Toileting    Toileting assist Assist for toileting: Minimal Assistance - Patient > 75%     Transfers Chair/bed transfer  Transfers assist     Chair/bed transfer assist level: Minimal Assistance - Patient > 75%  Locomotion Ambulation   Ambulation assist      Assist level: Minimal Assistance - Patient > 75% Assistive device: Walker-rolling Max distance: 80   Walk 10 feet activity   Assist     Assist level: Minimal Assistance - Patient > 75% Assistive device: Walker-rolling   Walk 50 feet activity   Assist    Assist level: Minimal Assistance - Patient > 75% Assistive device: Walker-rolling    Walk 150 feet activity   Assist Walk 150 feet activity did not occur: Safety/medical concerns         Walk 10 feet on uneven surface  activity   Assist Walk 10 feet on uneven surfaces activity did not occur: Safety/medical concerns         Wheelchair     Assist Is the patient using a wheelchair?: Yes Type of Wheelchair: Manual    Wheelchair assist level:  Dependent - Patient 0% Max wheelchair distance: >150    Wheelchair 50 feet with 2 turns activity    Assist        Assist Level: Dependent - Patient 0%   Wheelchair 150 feet activity     Assist      Assist Level: Dependent - Patient 0%   Blood pressure 115/67, pulse 80, temperature 98.2 F (36.8 C), temperature source Oral, resp. rate 18, height 6' (1.829 m), weight 73.9 kg, SpO2 100 %.  Medical Problem List and Plan: 1. 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 Patient is status post three-level anterior cervical decompression and fusion 5/30.              -patient may  shower if cover incision             -ELOS/Goals: ~ 3 weeks- supervision to min A           -Continue CIR therapies including PT, OT, and SLP. Interdisciplinary team conference today to discuss goals, barriers to discharge, and dc planning.               -suggest estim- no hx of Cancer 2.  Antithrombotics: -DVT/anticoagulation:   -spoke with Dr. Jordan Likes, Lovenox 40mg  QD started             -antiplatelet therapy: none   3. Pain Management: Tylenol, Norco 5 or 10, and Robaxin as needed -gabapentin just increased to 400 mg TID last night--obsv for response today. May need 600mg  dose  - Valium 2 mg q6 hours prn for muscle tightness of neck - lidoderm 2 patches 8pm to 8am -6/11 increase gabapentin to 600mg  tid to better cover evening/morning pain   4. Mood/Behavior/Sleep: LCSW to evaluate and provide emotional support             -antipsychotic agents: n/a -08/29/22 not sleeping well, added melatonin 5mg  QHS; also has trazodone PRN -08/31/22 sleep improving, cont regimen   5. Neuropsych/cognition: This patient is capable of making decisions on his own behalf.   6. Skin/Wound Care: Routine skin care checks             -monitor surgical incision   7. Fluids/Electrolytes/Nutrition: Routine Is and Os and follow-up chemistries I  personally reviewed the patient's labs today.  -Na+ holding at 130 6/10, other labs look good    8: Constipation due to neurogenic bowel: currently Colace 100 mg BID, Miralax QD, dulcolax 10mg  suppository QD             -had results  with sorbitol yesterday -6/11/24bm reported by nursing with program last night   9: History of chronic hepatitis C: completed treatment; follows with ID   10. Neurogenic bladder with urinary retention-   -timed voiding attempts every 6 hours. Scan patient after voids and have pt cathed if volumes >350cc-   - up to bsc, toilet, bladder massage and double voiding  -6/10 now voiding continently and with low PVR's (122)  11. Post-op dysphagia---due to cervical surgery. Struggling with solids -change to D3 diet for now  - cepacol and chloraseptic spray PRN  12. Elevated BPs: -08/30/22 pt noted to have elevated BPs, per chart review, it looks like he was remotely on HCTZ 25mg  QD and Lisinopril 40mg  QD; will restart HCTZ 25mg  QD today -6/10 HCTZ just restarted--added 10mg  lisinopril also -6/11 numbers better today Vitals:   08/28/22 1324 08/28/22 2017 08/29/22 0440 08/29/22 1530  BP: (!) 137/93 (!) 141/85 (!) 145/93 (!) 157/102   08/29/22 1927 08/30/22 0423 08/30/22 1544 08/30/22 1954  BP: (!) 146/87 (!) 146/90 (!) 160/99 (!) 166/100   08/31/22 0519 08/31/22 1307 08/31/22 1958 09/01/22 0417  BP: (!) 126/104 (!) 155/90 128/73 115/67     13. Tobacco use: no cravings per H&P, encourage cessation on discharge.   LOS: 5 days A FACE TO FACE EVALUATION WAS PERFORMED  Ranelle Oyster 09/01/2022, 9:26 AM

## 2022-09-02 DIAGNOSIS — S14155D Other incomplete lesion at C5 level of cervical spinal cord, subsequent encounter: Secondary | ICD-10-CM | POA: Diagnosis not present

## 2022-09-02 DIAGNOSIS — N319 Neuromuscular dysfunction of bladder, unspecified: Secondary | ICD-10-CM | POA: Diagnosis not present

## 2022-09-02 DIAGNOSIS — M792 Neuralgia and neuritis, unspecified: Secondary | ICD-10-CM | POA: Diagnosis not present

## 2022-09-02 DIAGNOSIS — K592 Neurogenic bowel, not elsewhere classified: Secondary | ICD-10-CM | POA: Diagnosis not present

## 2022-09-02 NOTE — Progress Notes (Signed)
Occupational Therapy Session Note  Patient Details  Name: Kenneth Flores MRN: [id number] Date of Birth: [birth year]  Today's Date: 09/02/2022 OT Individual Time: 1000-1101 OT Individual Time Calculation (min): 61 min    Short Term Goals: Week 1:  OT Short Term Goal 1 (Week 1): Pt will trial AE for use of feeding with Rt hand with Min A OT Short Term Goal 2 (Week 1): Pt will complete LB dressing with AE and/or adaptive methods as necessary with Mod A OT Short Term Goal 3 (Week 1): Pt will complete tub/shower transfer with CGA with LRAD OT Short Term Goal 4 (Week 1): Pt will stand at sink while completing simple grooming task with LRAD with CGA  Skilled Therapeutic Interventions/Progress Updates:      Therapy Documentation Precautions:  Precautions Precautions: Fall, Cervical Precaution Comments: soft collar when OOB Required Braces or Orthoses: Cervical Brace Cervical Brace: Soft collar Restrictions Weight Bearing Restrictions: No General: "Hello there." Pt seated in W/C upon OT arrival, agreeable to OT.  Pain: pt reports 6/10 in bilateral arms/shoulders, nursing medicated pt upon OT arrival.      Neuromuscular-re-ed: Pt completed blocked practice of exercises seated in W/C in order to increase independence with functional reaching/grasp/release to perform ADLs such as LB dressing: -pronation/supination -forward reaching to target -forward reach and grasp/release cup out of BOS   Therapeutic Activities: Pt completed variety of functional grasp/release activities while practicing functional movement patterns with LUE. Activities as follows: -pt standing at tabletop CGA retrieving and placing magnetic checker pieces on vertical inclined board, pt demonstrated increased difficulty with maintaining functional movement patterns while maintaining standing, occasional hand over hand assistance provided in order to maintain functional movement patterns, focus on functional  grasp/pinch/release with increased difficulty -pt seated in W/C to decrease challenge, retrieving and placing pegs into peg board,  occasional hand over hand assistance provided in order to maintain functional movement patterns, focus on functional grasp/pinch/release with larger/longer items to decrease difficulty and increase success rate, less noted errors with activity with increased ability to complete without hand over hand assistance    Other Treatments: OT wrapped W/C hand rims with yellow theraband to accommodate for decreased grip strength when propelling manual W/C. Pt trialed pushing W/C ~50 feet with decreased difficulty to propel reported from pt and noted from OT.  Pt seated in W/C at end of session with W/C alarm donned, call light within reach and 4Ps assessed.   Therapy/Group: Individual Therapy  Velia Meyer, OTD, OTR/L 09/02/2022, 4:07 PM

## 2022-09-02 NOTE — Progress Notes (Signed)
Occupational Therapy Session Note  Patient Details  Name: Kenneth Flores MRN: [id number] Date of Birth: [birth year]  Today's Date: 09/02/2022 OT Individual Time: 1402-1430 OT Individual Time Calculation (min): 28 min    Short Term Goals: Week 1:  OT Short Term Goal 1 (Week 1): Pt will trial AE for use of feeding with Rt hand with Min A OT Short Term Goal 2 (Week 1): Pt will complete LB dressing with AE and/or adaptive methods as necessary with Mod A OT Short Term Goal 3 (Week 1): Pt will complete tub/shower transfer with CGA with LRAD OT Short Term Goal 4 (Week 1): Pt will stand at sink while completing simple grooming task with LRAD with CGA  Skilled Therapeutic Interventions/Progress Updates:    Patient received supine in bed sleeping.  Patient arousable to knock on door, but slightly drowsy at start of session.  Sitting up in bed and attempting to bring legs over to short sitting position with bed rail up.  Patient able to accurately verbalize what he did in therapy earlier today, and also his deficits. Assessed UE movement in seated position and determined supine would be beneficial for controlled proximal movement (shoulder and elbow) with reduced gravitational impact.   With 1lb weighted bar and left hand wrapped to ensure grip, worked on shoulder flexion with elbow extension - chest press x 10.  Patient needing cueing and facilitation to activate anterior versus lateral deltoid for exercise.  Patient did well with cue to tuck in elbows.   Patient lacks full active elbow extension with 80-90* shoulder flexion.  Worked on isolated elbow, wrist, forearm motion as well.   Patient left in bed with bed alarm engaged and call bell/ personal items in reach.   Therapy Documentation Precautions:  Precautions Precautions: Fall, Cervical Precaution Comments: soft collar when OOB Required Braces or Orthoses: Cervical Brace Cervical Brace: Soft collar Restrictions Weight Bearing Restrictions: No    Pain:  Denies pain     Therapy/Group: Individual Therapy  Collier Salina 09/02/2022, 2:38 PM

## 2022-09-02 NOTE — Progress Notes (Addendum)
PROGRESS NOTE   Subjective/Complaints:  Neck sore. Asked about steristrips-wound. Feels that he doesn't need suppository any more for bm  ROS: Patient denies fever, rash, sore throat, blurred vision, dizziness, nausea, vomiting, diarrhea, cough, shortness of breath or chest pain, joint or back/neck pain, headache, or mood change.     Objective:   No results found. Recent Labs    08/31/22 0624  WBC 9.4  HGB 14.8  HCT 40.2  PLT 176   Recent Labs    08/31/22 0624  NA 130*  K 3.5  CL 93*  CO2 28  GLUCOSE 102*  BUN 18  CREATININE 0.90  CALCIUM 9.1    Intake/Output Summary (Last 24 hours) at 09/02/2022 0800 Last data filed at 09/02/2022 0401 Gross per 24 hour  Intake 480 ml  Output 650 ml  Net -170 ml        Physical Exam: Vital Signs Blood pressure 123/76, pulse 70, temperature 98.1 F (36.7 C), temperature source Oral, resp. rate 16, height 6' (1.829 m), weight 73.9 kg, SpO2 100 %.  Constitutional: No distress . Vital signs reviewed. HEENT: NCAT, EOMI, oral membranes moist Neck: sl swelling at incision site.  Cardiovascular: RRR without murmur. No JVD    Respiratory/Chest: CTA Bilaterally without wheezes or rales. Normal effort    GI/Abdomen: BS +, non-tender, non-distended Ext: no clubbing, cyanosis, or edema Psych: pleasant and cooperative  Skin: incision Clean and intact without signs of breakdown--steristrips coming loose Neuro:  Alert and oriented x 3. Normal insight and awareness. Intact Memory. Normal language and speech. Cranial nerve exam unremarkable. MMT: RUE 4/5 delt, biceps, 4- triceps, 3+WE, 3+/5 HI. LUE 3/5 deltoids, biceps, triceps and  2+ to 3- WE, remains 2+ perhaps 3- HI. BLE motor grossly 4/5 HF, KE and 4+ ADF/PF. Decreased LT in both arms. Senses pain and LT in both legs. No abnl resting tone. DTR's absent  Musculoskeletal: Less head forward posture, shoulder girdle and posterior neck  musculature tight.     Assessment/Plan: 1. Functional deficits which require 3+ hours per day of interdisciplinary therapy in a comprehensive inpatient rehab setting. Physiatrist is providing close team supervision and 24 hour management of active medical problems listed below. Physiatrist and rehab team continue to assess barriers to discharge/monitor patient progress toward functional and medical goals  Care Tool:  Bathing    Body parts bathed by patient: Left arm, Chest, Abdomen, Front perineal area, Buttocks, Right upper leg, Left upper leg, Right lower leg, Left lower leg, Face   Body parts bathed by helper: Right arm     Bathing assist Assist Level: Minimal Assistance - Patient > 75%     Upper Body Dressing/Undressing Upper body dressing   What is the patient wearing?: Pull over shirt    Upper body assist Assist Level: Supervision/Verbal cueing    Lower Body Dressing/Undressing Lower body dressing      What is the patient wearing?: Pants, Underwear/pull up     Lower body assist Assist for lower body dressing: Minimal Assistance - Patient > 75%     Toileting Toileting    Toileting assist Assist for toileting: Minimal Assistance - Patient > 75%  Transfers Chair/bed transfer  Transfers assist     Chair/bed transfer assist level: Minimal Assistance - Patient > 75%     Locomotion Ambulation   Ambulation assist      Assist level: Minimal Assistance - Patient > 75% Assistive device: Walker-rolling Max distance: 80   Walk 10 feet activity   Assist     Assist level: Minimal Assistance - Patient > 75% Assistive device: Walker-rolling   Walk 50 feet activity   Assist    Assist level: Minimal Assistance - Patient > 75% Assistive device: Walker-rolling    Walk 150 feet activity   Assist Walk 150 feet activity did not occur: Safety/medical concerns         Walk 10 feet on uneven surface  activity   Assist Walk 10 feet on uneven  surfaces activity did not occur: Safety/medical concerns         Wheelchair     Assist Is the patient using a wheelchair?: Yes Type of Wheelchair: Manual    Wheelchair assist level: Dependent - Patient 0% Max wheelchair distance: >150    Wheelchair 50 feet with 2 turns activity    Assist        Assist Level: Dependent - Patient 0%   Wheelchair 150 feet activity     Assist      Assist Level: Dependent - Patient 0%   Blood pressure 123/76, pulse 70, temperature 98.1 F (36.7 C), temperature source Oral, resp. rate 16, height 6' (1.829 m), weight 73.9 kg, SpO2 100 %.  Medical Problem List and Plan: 1. 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 Patient is status post three-level anterior cervical decompression and fusion 5/30.              -patient may  shower with incision UNCOVERED             -ELOS/Goals: 09/19/22           --Continue CIR therapies including PT, OT                -continue e-stim 2.  Antithrombotics: -DVT/anticoagulation:     Lovenox 40mg  QD               -antiplatelet therapy: none   3. Pain Management: Tylenol, Norco 5 or 10, and Robaxin as needed - Valium 2 mg q6 hours prn for muscle tightness of neck - lidoderm 2 patches 8pm to 8am -6/11 increased gabapentin to 600mg  tid to better cover evening/morning pain   -6/12--observe response and tolerance today.  4. Mood/Behavior/Sleep: LCSW to evaluate and provide emotional support             -antipsychotic agents: n/a -08/29/22 not sleeping well, added melatonin 5mg  QHS; also has trazodone PRN -08/31/22 sleep improving, cont regimen   5. Neuropsych/cognition: This patient is capable of making decisions on his own behalf.   6. Skin/Wound Care: Routine skin care checks             -monitor surgical incision   7. Fluids/Electrolytes/Nutrition: Routine Is and Os and follow-up chemistries I personally reviewed the  patient's labs today.  -Na+ holding at 130 6/10, other labs look good    8: Constipation due to neurogenic bowel: currently Colace 100 mg BID, Miralax QD, dulcolax 10mg  suppository QD             -had results with sorbitol yesterday -6/12--will try without evening suppository as he's having urge/sensation  to empty  9: History of chronic hepatitis C: completed treatment; follows with ID   10. Neurogenic bladder with urinary retention-   -timed voiding attempts every 6 hours. Scan patient after voids and have pt cathed if volumes >350cc-   - up to bsc, toilet, bladder massage and double voiding  -6/10 now voiding continently and with low PVR's (122)  11. Post-op dysphagia---due to cervical surgery. Struggling with solids -change to D3 diet for now  - cepacol and chloraseptic spray PRN  12. Elevated BPs: -08/30/22 pt noted to have elevated BPs, per chart review, it looks like he was remotely on HCTZ 25mg  QD and Lisinopril 40mg  QD; will restart HCTZ 25mg  QD today -6/10 HCTZ just restarted--added 10mg  lisinopril also -6/12 improving control Vitals:   08/29/22 1927 08/30/22 0423 08/30/22 1544 08/30/22 1954  BP: (!) 146/87 (!) 146/90 (!) 160/99 (!) 166/100   08/31/22 0519 08/31/22 1307 08/31/22 1958 09/01/22 0417  BP: (!) 126/104 (!) 155/90 128/73 115/67   09/01/22 1308 09/01/22 1525 09/01/22 1941 09/02/22 0400  BP: 100/71 126/76 120/77 123/76     13. Tobacco use: no cravings per H&P, encourage cessation on discharge.   LOS: 6 days A FACE TO FACE EVALUATION WAS PERFORMED  Ranelle Oyster 09/02/2022, 8:00 AM

## 2022-09-02 NOTE — Progress Notes (Addendum)
Physical Therapy Session Note  Patient Details  Name: Kenneth Flores MRN: [id number] Date of Birth: [birth year]  Today's Date: 09/02/2022 PT Individual Time: 1308-6578 + 1450-1534 PT Individual Time Calculation (min): 70 min  + 44 min  Short Term Goals: Week 1:  PT Short Term Goal 1 (Week 1): pt will initiate self propulsion of WC with bil UE's. PT Short Term Goal 2 (Week 1): pt will begin management of WC features to be able to set up transfers. PT Short Term Goal 3 (Week 1): patient will complete slide board transfer at [facility] level. PT Short Term Goal 4 (Week 1): pt will ambulate 100' with min assist with RW.  Skilled Therapeutic Interventions/Progress Updates:     SESSION 1: Pt presents in room seated EOB, agreeable to PT. Pt reports unrated pain in neck but does not request intervention. Session focused on gait training with and without assistive device and NMR without UE support to promote BLE coordination, dynamic standing balance, and single limb stance time. Pt completes sit<>stands and stand pivots with supervision with device, CGA without device throughout session.  Pt requests to brush teeth, completes sit>stand to RW managing L hemi splint without assist, pt ambulates within room to sink to complete hygiene with pt managing LUE off hemi splint for bimanual manipulation of tooth brush and tooth paste. Pt maintains standing at counter for 2 minutes without UE support but does use anterior hips propped on counter for balance for activity. Pt then requests to use bathroom, ambulates to bathroom, able to manage opening door with supervision and verbal cueing with RUE. Pt stands to use bathroom for 4 min unilateral UE support, unable to void with increased time to complete.  Pt ambulates from room to main gym with CGA/close supervision, good foot clearance no toe catch.  Pt completes NMR for standing balance activities without UE support CGA for stability throughout to promote improved BLE  coordination, postural stability and single limb stance including: Marching alternating BLE x20 (decreased stance time LLE however improves with repetition) Forward step BLE x10  Lateral step BLE x10 Backward step BLE x10 Forward Step taps 6" steps BLE x10 Lateral step taps 6" steps BLE x10  Pt ambulates 106' and 139' no device with CGA/light min assist demo increased anterior/posterior and lateral postural sway but no LOB, good foot clearance no knee instability noted. Pt demonstrates decreased gait speed and good self correction for instability improves with repetition.  Pt ambulates forward/backward 4x5' with CGA/light min assist no device. Pt completes side stepping 3x7' bilaterally CGA no device. Pt completes side stepping while holding mini squat 2x7' bilaterally CGA. Gait training completed for multidirectional stepping stability needed for postural reactions and directional changes with gait.  Pt completes up/down 8 6" steps, BHRs, with CGA/light min assist, reciprocal gait ascending and descending, verbal cues to place full foot on step for initial 4 steps with ascending however corrects with 2nd 4 steps ascending without verbal cueing.  Pt ambulates from main gym to room with close supervision with RW L hemi hand splint, occasionally demonstrates decreased proximity to RW however self corrects without verbal cues, supervision provided for safety with increased trunk sway. Pt remains seated in WC with all needs within reach, call light in place, and chair alarm donned and activated at end of session.  SESSION 2: Pt presents in room in bed and agreeable to PT. Pt reporting some soreness in BUEs from earlier sessions and some pain and soreness in neck however declines intervention.  Session focused on therapeutic exercise to promote BUE/BLE/core strengthening and muscular endurance. Pt completes sit<>stands with CGA with RW this session.  Pt completes bed mobility modI with minimal elevation  to HOB. Pt completes sit>stand to RW and ambulates 115' from room to ortho gym to sit on nustep.  Pt completes interval training on Nustep on L5 with BUE/BLE work/73min rest for total 10 min, verbal cues to maintain 60 steps/minute with each working trial to maintain RPE 13 or greater.  Pt completes seated and supine therex for core strengthening including: Seated sit ups with med ball x10 (recline to ball positioned posterior) Supine heel taps maintaining table top position with feet x10 BLE (therapist provide min assist for maintain LLE elevated during RLE taps) Lower trunk rotations x10 with verbal cues for core tension throughout  Pt completes supine to sit EOM from flat mat with supervision, completes sit>stand to RW with CGA and ambulates back to room 115' with CGA due to fatigue with occasional L foot catch, increased postural sway. Pt returns to sitting EOB and completes doffing of shoes and TED hose with supervision for RLE, requires min assist for LLE with fatigue. Pt dons L nonslip sock with min assist, therapist dons R nonslip sock with total assist with fatigue. Pt remains supine semi reclined with all needs within reach, call light in place, and bed alarm activated at end of session.  Therapy Documentation Precautions:  Precautions Precautions: Fall, Cervical Precaution Comments: soft collar when OOB Required Braces or Orthoses: Cervical Brace Cervical Brace: Soft collar Restrictions Weight Bearing Restrictions: No  Therapy/Group: Individual Therapy  Edwin Cap PT, DPT 09/02/2022, 9:16 AM

## 2022-09-03 DIAGNOSIS — N319 Neuromuscular dysfunction of bladder, unspecified: Secondary | ICD-10-CM | POA: Diagnosis not present

## 2022-09-03 DIAGNOSIS — M792 Neuralgia and neuritis, unspecified: Secondary | ICD-10-CM | POA: Diagnosis not present

## 2022-09-03 DIAGNOSIS — S14155D Other incomplete lesion at C5 level of cervical spinal cord, subsequent encounter: Secondary | ICD-10-CM | POA: Diagnosis not present

## 2022-09-03 DIAGNOSIS — K592 Neurogenic bowel, not elsewhere classified: Secondary | ICD-10-CM | POA: Diagnosis not present

## 2022-09-03 NOTE — Progress Notes (Signed)
Patient ID: Kenneth Flores, male   DOB: 08/09/1960, 62 y.o.   MRN: [id number]  SW met with pt in room to provide updates from team conference, and pick up FMLA forms. SW provided forms to PA.   Cecile Sheerer, MSW, LCSWA Office: [phone] Cell: [phone] Fax: [phone]

## 2022-09-03 NOTE — Progress Notes (Signed)
Physical Therapy Session Note  Patient Details  Name: Kenneth Flores MRN: [id number] Date of Birth: 1961-03-05  Today's Date: 09/03/2022 PT Individual Time: 0950-1050 and 1427-1539 PT Individual Time Calculation (min): 60 min and 72 min  Short Term Goals: Week 1:  PT Short Term Goal 1 (Week 1): pt will initiate self propulsion of WC with bil UE's. PT Short Term Goal 2 (Week 1): pt will begin management of WC features to be able to set up transfers. PT Short Term Goal 3 (Week 1): patient will complete slide board transfer at [facility] level. PT Short Term Goal 4 (Week 1): pt will ambulate 100' with min assist with RW.  Skilled Therapeutic Interventions/Progress Updates: Pt presented sitting EOB agreeable to therapy. Pt states mild pain in LUE, did not rate during session. Pt provided with shoes and was able to don with set up and increased time. Performed Sit to stand with CGA and RW. Pt ambulated with RW to main gym and CGA. Noted decreased L foot clearance but foot did not catch. In main gyn participated in dynamic balance activities including toe taps to 6in step without AD 2 x 10 with overall CGA but pt requiring minA due to x 1 LOB to R. Pt also participated in several rounds of catch initially in sitting then was able to progress to standing with use of rebounder 2 x 15, pt pleased that he is able to catch using both hands. Pt also participated in BERG balance test with score of 42/56 as noted below. During rest break pt indicated needs MD to complete STD form, sent message to [place], LSW. Pt then ambulated back to room in same manner as prior and returned to EOB doffed shoes then transferred to supine with supervision and use of bed features. Pt left in bed at end of session with bed alarm on, call bell within reach and needs met.   Tx2: Pt presented sitting EOB agreeable to therapy. Pt states mild pain in LUE but premedicated. Pt stood with CGA and ambulated with RW to day room. Mild L knee instability  noted but was able to consistently clear L foot and no LOB noted. In day room pt participated in first portion of dance group incorporating BUE and BLE activities for coordination. Pt required intermittent rest breaks due to increased pain and fatigue of LUE however was able to perform most movements modifying at times as needed. Pt then ambulated back to main gym in same manner as prior with RW and CGA. In gym pt participated in dynamic balance and gait activities. In parallel bars worked using agility ladder incorporating forward stepping/side stepping/backwards stepping with no AD. Pt able to complete with CGA and tactile cues for improved erect posture. With fatigue noted increased L knee flexion but no buckling. After backwards walking pt stating increased tightness in lower back. Ambulated back to chair and performed forward and lateral stretching with use of physioball x 5 each direction with extended hold of 15 seconds on last rep. Pt stated improvement after activity. Pt then ambulated to stairs and ascended/descended x 8 steps with B rails and CGA. Pt was able to ambulate with step through pattern and only required verbal cues for avoiding adduction when stepping down with LLE. Pt then worked on L hip flexors performing toe taps to second step with LLE 2 x 10. Pt then ambulated back to chair and set up with Airex. Pt stepped onto Airex with RW but was able to maintain static standing  without AD. Activity was then progressed to PTA performing gentle nudges multidirectional for increased ankle strategy recruitment with good tolerance. PTA then set up soft "lego" blocks with pt remaining on Airex to add bimanual challenge to activity. Pt was able to complete L2 challenges with increased time but without assist. After seated rest pt then ambulated back to room with CGA and RW. In room pt sat EOB and doffed shoes and TED hose with increased time. Pt transferred to supine with supervision and handed off to NT  for bladder scan with current needs met.      Therapy Documentation Precautions:  Precautions Precautions: Fall, Cervical Precaution Comments: soft collar when OOB Required Braces or Orthoses: Cervical Brace Cervical Brace: Soft collar Restrictions Weight Bearing Restrictions: No General:   Vital Signs:   Pain: Pain Assessment Pain Scale: 0-10 Pain Score: 7  Pain Type: Acute pain Pain Location: Neck Pain Orientation: Right Pain Radiating Towards: shoulder;arm Pain Descriptors / Indicators: Aching Pain Frequency: Constant Pain Onset: Gradual Pain Intervention(s): Medication (See eMAR) Mobility:   Locomotion :    Trunk/Postural Assessment :    Balance: Standardized Balance Assessment Standardized Balance Assessment: Berg Balance Test Berg Balance Test Sit to Stand: Able to stand without using hands and stabilize independently Standing Unsupported: Able to stand safely 2 minutes Sitting with Back Unsupported but Feet Supported on Floor or Stool: Able to sit safely and securely 2 minutes Stand to Sit: Sits safely with minimal use of hands Transfers: Able to transfer safely, definite need of hands Standing Unsupported with Eyes Closed: Able to stand 10 seconds with supervision Standing Ubsupported with Feet Together: Able to place feet together independently and stand for 1 minute with supervision From Standing, Reach Forward with Outstretched Arm: Can reach forward >12 cm safely (5") From Standing Position, Pick up Object from Floor: Able to pick up shoe, needs supervision From Standing Position, Turn to Look Behind Over each Shoulder: Needs assist to keep from losing balance and falling (Cx precautions) Turn 360 Degrees: Able to turn 360 degrees safely but slowly Standing Unsupported, Alternately Place Feet on Step/Stool: Able to stand independently and complete 8 steps >20 seconds Standing Unsupported, One Foot in Front: Able to plae foot ahead of the other  independently and hold 30 seconds Standing on One Leg: Able to lift leg independently and hold 5-10 seconds Total Score: 42 Exercises:   Other Treatments:      Therapy/Group: Individual Therapy  Bayleigh Loflin 09/03/2022, 12:27 PM

## 2022-09-03 NOTE — Plan of Care (Signed)
  Problem: RH Bathing Goal: LTG Patient will bathe all body parts with assist levels (OT) Description: LTG: Patient will bathe all body parts with assist levels (OT) Flowsheets (Taken 09/03/2022 1540) LTG: Pt will perform bathing with assistance level/cueing: (Upgraded d/t progress) Independent with assistive device  Note: Upgraded d/t progress    Problem: RH Dressing Goal: LTG Patient will perform lower body dressing w/assist (OT) Description: LTG: Patient will perform lower body dressing with assist, with/without cues in positioning using equipment (OT) Flowsheets (Taken 09/03/2022 1540) LTG: Pt will perform lower body dressing with assistance level of: (Upgraded d/t progress) Independent with assistive device Note: Upgraded d/t progress    Problem: RH Toileting Goal: LTG Patient will perform toileting task (3/3 steps) with assistance level (OT) Description: LTG: Patient will perform toileting task (3/3 steps) with assistance level (OT)  Flowsheets (Taken 09/03/2022 1540) LTG: Pt will perform toileting task (3/3 steps) with assistance level: (Upgraded d/t progress) Independent with assistive device Note: Upgraded d/t progress    Problem: RH Toilet Transfers Goal: LTG Patient will perform toilet transfers w/assist (OT) Description: LTG: Patient will perform toilet transfers with assist, with/without cues using equipment (OT) Flowsheets (Taken 09/03/2022 1540) LTG: Pt will perform toilet transfers with assistance level of: (Upgraded d/t progress) Independent with assistive device Note: Upgraded d/t progress    Problem: RH Tub/Shower Transfers Goal: LTG Patient will perform tub/shower transfers w/assist (OT) Description: LTG: Patient will perform tub/shower transfers with assist, with/without cues using equipment (OT) Flowsheets (Taken 09/03/2022 1540) LTG: Pt will perform tub/shower stall transfers with assistance level of: (Upgraded d/t progress) Independent with assistive device Note:  Upgraded d/t progress    Problem: RH Furniture Transfers Goal: LTG Patient will perform furniture transfers w/assist (OT/PT) Description: LTG: Patient will perform furniture transfers  with assistance (OT/PT). Flowsheets (Taken 09/03/2022 1540) LTG: Pt will perform furniture transfers with assist:: (Upgraded d/t progress) Independent with assistive device  Note: Upgraded d/t progress    Problem: RH Functional Use of Upper Extremity Goal: LTG Patient will use RT/LT upper extremity as a (OT) Description: LTG: Patient will use right/left upper extremity as a stabilizer/gross assist/diminished/nondominant/dominant level with assist, with/without cues during functional activity (OT) Flowsheets (Taken 09/03/2022 1549) LTG: Use of upper extremity in functional activities:  LUE as gross assist level  RUE as nondominant level LTG: Pt will use upper extremity in functional activity with assistance level of: Independent with assistive device Added goal

## 2022-09-03 NOTE — Progress Notes (Signed)
PROGRESS NOTE   Subjective/Complaints:  Pt felt more weakness and a cool feeling in his left arm today. Had some concerns. Found him in the bathroom with therapy and he was actually using his left arm and hand to move the walker and to void.  ROS: Patient denies fever, rash, sore throat, blurred vision, dizziness, nausea, vomiting, diarrhea, cough, shortness of breath or chest pain,   headache, or mood change.    Objective:   No results found. Recent Labs    08/31/22 0624  WBC 9.4  HGB 14.8  HCT 40.2  PLT 176   Recent Labs    08/31/22 0624  NA 130*  K 3.5  CL 93*  CO2 28  GLUCOSE 102*  BUN 18  CREATININE 0.90  CALCIUM 9.1    Intake/Output Summary (Last 24 hours) at 09/02/2022 0800 Last data filed at 09/02/2022 0401 Gross per 24 hour  Intake 480 ml  Output 650 ml  Net -170 ml        Physical Exam: Vital Signs Blood pressure 123/76, pulse 70, temperature 98.1 F (36.7 C), temperature source Oral, resp. rate 16, height 6' (1.829 m), weight 73.9 kg, SpO2 100 %.  Constitutional: No distress . Vital signs reviewed. HEENT: NCAT, EOMI, oral membranes moist Neck: supple Cardiovascular: RRR without murmur. No JVD    Respiratory/Chest: CTA Bilaterally without wheezes or rales. Normal effort    GI/Abdomen: BS +, non-tender, non-distended Ext: no clubbing, cyanosis, or edema. Both arms appropriately warm to touch Psych: pleasant and cooperative, a little anxious  Skin: incision Clean and intact without signs of breakdown--steristrips coming loose Neuro:  Alert and oriented x 3. Normal insight and awareness. Intact Memory. Normal language and speech. Cranial nerve exam unremarkable. MMT: RUE 4/5 delt, biceps, 4- triceps, 3+WE, 3+/5 HI. LUE 3/5 deltoids, biceps, triceps and    3- to 3 WE and HI. BLE motor grossly 4/5 HF, KE and 4+ ADF/PF. Decreased LT in both arms. Senses pain and LT in both legs. No abnl resting tone.  DTR's absent  Musculoskeletal: Less head forward posture, shoulder girdle and posterior neck musculature tight.     Assessment/Plan: 1. Functional deficits which require 3+ hours per day of interdisciplinary therapy in a comprehensive inpatient rehab setting. Physiatrist is providing close team supervision and 24 hour management of active medical problems listed below. Physiatrist and rehab team continue to assess barriers to discharge/monitor patient progress toward functional and medical goals  Care Tool:  Bathing    Body parts bathed by patient: Left arm, Chest, Abdomen, Front perineal area, Buttocks, Right upper leg, Left upper leg, Right lower leg, Left lower leg, Face   Body parts bathed by helper: Right arm     Bathing assist Assist Level: Minimal Assistance - Patient > 75%     Upper Body Dressing/Undressing Upper body dressing   What is the patient wearing?: Pull over shirt    Upper body assist Assist Level: Supervision/Verbal cueing    Lower Body Dressing/Undressing Lower body dressing      What is the patient wearing?: Pants, Underwear/pull up     Lower body assist Assist for lower body dressing: Minimal Assistance - Patient >  75%     Toileting Toileting    Toileting assist Assist for toileting: Minimal Assistance - Patient > 75%     Transfers Chair/bed transfer  Transfers assist     Chair/bed transfer assist level: Minimal Assistance - Patient > 75%     Locomotion Ambulation   Ambulation assist      Assist level: Minimal Assistance - Patient > 75% Assistive device: Walker-rolling Max distance: 80   Walk 10 feet activity   Assist     Assist level: Minimal Assistance - Patient > 75% Assistive device: Walker-rolling   Walk 50 feet activity   Assist    Assist level: Minimal Assistance - Patient > 75% Assistive device: Walker-rolling    Walk 150 feet activity   Assist Walk 150 feet activity did not occur: Safety/medical  concerns         Walk 10 feet on uneven surface  activity   Assist Walk 10 feet on uneven surfaces activity did not occur: Safety/medical concerns         Wheelchair     Assist Is the patient using a wheelchair?: Yes Type of Wheelchair: Manual    Wheelchair assist level: Dependent - Patient 0% Max wheelchair distance: >150    Wheelchair 50 feet with 2 turns activity    Assist        Assist Level: Dependent - Patient 0%   Wheelchair 150 feet activity     Assist      Assist Level: Dependent - Patient 0%   Blood pressure 123/76, pulse 70, temperature 98.1 F (36.7 C), temperature source Oral, resp. rate 16, height 6' (1.829 m), weight 73.9 kg, SpO2 100 %.  Medical Problem List and Plan: 1. 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 Patient is status post three-level anterior cervical decompression and fusion 5/30.              -patient may  shower with incision UNCOVERED             -ELOS/Goals: 09/19/22           -Continue CIR therapies including PT, OT                -reassured pt that his exam was not only stable but that he even looked a little stronger today. I talked about the fact that paresthesias/dysesthesias can be variable in their intensity and location.  2.  Antithrombotics: -DVT/anticoagulation:     Lovenox 40mg  QD               -antiplatelet therapy: none   3. Pain Management: Tylenol, Norco 5 or 10, and Robaxin as needed - Valium 2 mg q6 hours prn for muscle tightness of neck - lidoderm 2 patches 8pm to 8am -6/13 continue gabapentin at 600mg  tid 4. Mood/Behavior/Sleep: LCSW to evaluate and provide emotional support             -antipsychotic agents: n/a -08/29/22 not sleeping well, added melatonin 5mg  QHS; also has trazodone PRN -sleep improved, cont regimen   5. Neuropsych/cognition: This patient is capable of making decisions on his own behalf.   6.  Skin/Wound Care: Routine skin care checks             -monitor surgical incision   7. Fluids/Electrolytes/Nutrition: Routine Is and Os and follow-up chemistries I personally reviewed the patient's labs today.  -Na+ holding at 130 6/10, other labs look good  8: Constipation due to neurogenic bowel: currently Colace 100 mg BID, Miralax QD, dulcolax 10mg  suppository QD             -had results with sorbitol yesterday -6/12--went  without evening suppository as he's having urge/sensation to empty  6/13 no bm yesterday 9: History of chronic hepatitis C: completed treatment; follows with ID   10. Neurogenic bladder with urinary retention-   -timed voiding attempts every 6 hours. Scan patient after voids and have pt cathed if volumes >350cc-   - up to bsc, toilet, bladder massage and double voiding  -6/10 now voiding continently and with low PVR's (122)  11. Post-op dysphagia---due to cervical surgery. Struggling with solids -change to D3 diet for now  - cepacol and chloraseptic spray PRN  12. Elevated BPs: -08/30/22 pt noted to have elevated BPs, per chart review, it looks like he was remotely on HCTZ 25mg  QD and Lisinopril 40mg  QD; will restart HCTZ 25mg  QD today -6/10 HCTZ just restarted--added 10mg  lisinopril also -6/13 improving control Vitals:   08/29/22 1927 08/30/22 0423 08/30/22 1544 08/30/22 1954  BP: (!) 146/87 (!) 146/90 (!) 160/99 (!) 166/100   08/31/22 0519 08/31/22 1307 08/31/22 1958 09/01/22 0417  BP: (!) 126/104 (!) 155/90 128/73 115/67   09/01/22 1308 09/01/22 1525 09/01/22 1941 09/02/22 0400  BP: 100/71 126/76 120/77 123/76     13. Tobacco use: no cravings per H&P, encourage cessation on discharge.   LOS: 6 days A FACE TO FACE EVALUATION WAS PERFORMED  Ranelle Oyster 09/02/2022, 8:00 AM

## 2022-09-03 NOTE — Progress Notes (Signed)
Occupational Therapy Daily/Weekly Progress Note  Patient Details  Name: Kenneth Flores MRN: [id number] Date of Birth: [birth year]  Beginning of progress report period: August 28, 2022 End of progress report period: September 03, 2022  Today's Date: 09/03/2022 OT Individual Time: 7829-5621 OT Individual Time Calculation (min): 76 min    Patient has met 4 of 4 short term goals.    Patient continues to demonstrate the following deficits: muscle weakness, unbalanced muscle activation and decreased coordination, and decreased standing balance and therefore will continue to benefit from skilled OT intervention to enhance overall performance with BADL and Reduce care partner burden. Pt overall SBA for ADLs and CGA-SA for functional mobility (see levels of assist below for more details). Pt LTG are Mod I level. Pt has not completed family Ed at current time. LTG upgraded d/t increased progress during IPR stay.  Patient progressing toward long term goals..  Plan of care revisions: goals upgraded and BUE goal added.  OT Short Term Goals Week 1:  OT Short Term Goal 1 (Week 1): Pt will trial AE for use of feeding with Rt hand with Min A OT Short Term Goal 1 - Progress (Week 1): Met OT Short Term Goal 2 (Week 1): Pt will complete LB dressing with AE and/or adaptive methods as necessary with Mod A OT Short Term Goal 2 - Progress (Week 1): Met OT Short Term Goal 3 (Week 1): Pt will complete tub/shower transfer with CGA with LRAD OT Short Term Goal 3 - Progress (Week 1): Met OT Short Term Goal 4 (Week 1): Pt will stand at sink while completing simple grooming task with LRAD with CGA OT Short Term Goal 4 - Progress (Week 1): Met Week 2:  OT Short Term Goal 1 (Week 2): Pt will initiate complete HEP program with Mod I OT Short Term Goal 2 (Week 2): Pt will stand to complete shower with CGA with AE as necessary OT Short Term Goal 3 (Week 2): Pt will demonstrate improved scapular/shoulder stability in order to increase  fine motor coordination while completing self care tasks  Skilled Therapeutic Interventions/Progress Updates:      Therapy Documentation Precautions:  Precautions Precautions: Fall, Cervical Precaution Comments: soft collar when OOB Required Braces or Orthoses: Cervical Brace Cervical Brace: Soft collar Restrictions Weight Bearing Restrictions: No General:  "I need a shower." Pt supine in bed upon OT arrival, agreeable to OT session.  Pain: No pain, reported numbness/tingling in LUE, doctor checked in during session regarding it.    ADL: Bed mobility: SBA supine> EOB without use of bed rails Grooming: SBA for washing face at sink in standing at RW, SBA in seated position to apply lotion to hands/feet Oral hygiene: SBA standing at sink with RW ~3:00 min Toilet transfer: SBA standing, ambulated from bed> toilet ~20 ft Toileting: SBA standing to void urine, able to manage pants over hips and flush UB dressing: SBA for overhead shirt in seated position LB dressing: SBA donning/doffing, figure 4 method to thread underwear/pants over feet, standing to manage over hips Footwear: SBA for donning/doffing grip socks/tennis shoes, Mod A for TEDs  Shower transfer: SBA at RW, ambulated from toilet> shower ~7 ft Bathing: SBA, able to wash/rinse/dry all body parts in seated position Transfers: SBA with RW for all transfers completed this date    Exercises: Pt completed the following exercise circuit in order to improve functional activity, strength and endurance to prepare for ADLs such as bathing. Pt completed the following exercises in seated/standing position with  no noted LOB/SOB and 20 repetitions on each exercise: -sit to stands without arm support -bicep curls with blue theraband -squeeze and hold 2 sec with blue sponge  Rest breaks after each set   Other Treatments:  Pt educated on muscle/nerve recovery 2/2 concerns of "tinglyness" in LUE. Pt educated on stress management strategies.    Pt supine in bed with bed alarm activated, 2 bed rails up, call light within reach and 4Ps assessed.  Therapy/Group: Individual Therapy  Velia Meyer, OTD, OTR/L 09/03/2022, 9:23 AM

## 2022-09-04 DIAGNOSIS — N319 Neuromuscular dysfunction of bladder, unspecified: Secondary | ICD-10-CM | POA: Diagnosis not present

## 2022-09-04 DIAGNOSIS — K592 Neurogenic bowel, not elsewhere classified: Secondary | ICD-10-CM | POA: Diagnosis not present

## 2022-09-04 DIAGNOSIS — S14155D Other incomplete lesion at C5 level of cervical spinal cord, subsequent encounter: Secondary | ICD-10-CM | POA: Diagnosis not present

## 2022-09-04 DIAGNOSIS — M792 Neuralgia and neuritis, unspecified: Secondary | ICD-10-CM | POA: Diagnosis not present

## 2022-09-04 NOTE — Progress Notes (Addendum)
Met with patient. Binder not in room. Reports that nurse had it and went over information. Asked to see if wife took home and if not, will make another book. No issues with bowel and bladder at this time. Smokes cigars at home. Wife doesn't smoke. Did encourage smoking cessation as may cause issues with healing process. Biggest issue at this point is pain control. K-pad added and hopefully will assist to decrease pain. Did not see a [place] J collar in room, only soft collar. All needs met, call bell in reach.   Nurse reported back that wife has educational binder.

## 2022-09-04 NOTE — Progress Notes (Signed)
Occupational Therapy Session Note  Patient Details  Name: Kenneth Flores MRN: [id number] Date of Birth: 06-27-1960  Today's Date: 09/04/2022 OT Individual Time: 1300-1345 OT Individual Time Calculation (min): 45 min    Short Term Goals: Week 2:  OT Short Term Goal 1 (Week 2): Pt will initiate complete HEP program with Mod I OT Short Term Goal 2 (Week 2): Pt will stand to complete shower with CGA with AE as necessary OT Short Term Goal 3 (Week 2): Pt will demonstrate improved scapular/shoulder stability in order to increase fine motor coordination while completing self care tasks  Skilled Therapeutic Interventions/Progress Updates:    Pt resting EOB upon arrival with RN present. Pt amb with RW to gym and participated in table tasks with large pegs. Pt used LUE to grasp and place large pegs on peg board replicating pattern. Pt donned gloves and removed pegs, cleaned pegs, and placed in container. Min A to don LUE glove. Pt amb with RW back to room and returned to bed. Bed alarm activated. All needs within reach.   Therapy Documentation Precautions:  Precautions Precautions: Fall, Cervical Precaution Comments: soft collar when OOB Required Braces or Orthoses: Cervical Brace Cervical Brace: Soft collar Restrictions Weight Bearing Restrictions: No  Pain: Pain Assessment Pain Scale: 0-10 Pain Score: 7  Pain Type: Acute pain Pain Location: Shoulder Pain Orientation: Right Pain Descriptors / Indicators: Aching;Constant Pain Frequency: Constant Pain Onset: On-going Patients Stated Pain Goal: 2 Pain Intervention(s): Meds admin at beginning of session; MFR   Therapy/Group: Individual Therapy  [name] 09/04/2022, 2:40 PM

## 2022-09-04 NOTE — Discharge Instructions (Addendum)
Inpatient Rehab Discharge Instructions  Dearl Rudden Discharge date and time: 09/19/2022  Activities/Precautions/ Functional Status: Activity: no lifting, driving, or strenuous exercise until cleared by MD Diet: regular diet Wound Care: keep wound clean and dry Functional status:  ___ No restrictions     ___ Walk up steps independently ___ 24/7 supervision/assistance   ___ Walk up steps with assistance _x__ Intermittent supervision/assistance  ___ Bathe/dress independently ___ Walk with walker     ___ Bathe/dress with assistance ___ Walk Independently    ___ Shower independently ___ Walk with assistance    __x_ Shower with assistance _x__ No alcohol     ___ Return to work/school ________  Special Instructions: No driving, alcohol consumption or tobacco use.   COMMUNITY REFERRALS UPON DISCHARGE:    Outpatient: PT     OT             Agency:[facility]     [phone]              Appointment Date/Time:*Please expect follow-up within 7-10 business days to schedule your appointment. If you have not received follow-u, be sure to contact the site directly.*  Medical Equipment/Items Ordered:single point cane                                                 Agency/Supplier:[facility] [phone]    My questions have been answered and I understand these instructions. I will adhere to these goals and the provided educational materials after my discharge from the hospital.  Patient/Caregiver Signature _______________________________ Date __________  Clinician Signature _______________________________________ Date __________  Please bring this form and your medication list with you to all your follow-up doctor's appointments.

## 2022-09-04 NOTE — Progress Notes (Addendum)
PROGRESS NOTE   Subjective/Complaints:  Pt doing fairly well this am. Left arm feels better but having soreness in his lower neck and traps still. I discussed his posture with him, and he said he's always leaned forward/had head forward posture. It's just more pronounced now.   ROS: Patient denies fever, rash, sore throat, blurred vision, dizziness, nausea, vomiting, diarrhea, cough, shortness of breath or chest pain,  headache, or mood change.    Objective:   No results found. No results for input(s): "WBC", "HGB", "HCT", "PLT" in the last 72 hours.  No results for input(s): "NA", "K", "CL", "CO2", "GLUCOSE", "BUN", "CREATININE", "CALCIUM" in the last 72 hours.   Intake/Output Summary (Last 24 hours) at 09/04/2022 1003 Last data filed at 09/04/2022 0700 Gross per 24 hour  Intake 358 ml  Output --  Net 358 ml        Physical Exam: Vital Signs Blood pressure (!) 149/88, pulse 64, temperature 97.7 F (36.5 C), temperature source Oral, resp. rate 18, height 6' (1.829 m), weight 73.9 kg, SpO2 100 %.  Constitutional: No distress . Vital signs reviewed. HEENT: NCAT, EOMI, oral membranes moist Neck: steristrips off neck.  Cardiovascular: RRR without murmur. No JVD    Respiratory/Chest: CTA Bilaterally without wheezes or rales. Normal effort    GI/Abdomen: BS +, non-tender, non-distended Ext: no clubbing, cyanosis, or edema Psych: pleasant and cooperative  Skin: incision Clean and intact without signs of breakdown Neuro:  Alert and oriented x 3. Normal insight and awareness. Intact Memory. Normal language and speech. Cranial nerve exam unremarkable. MMT: RUE 4/5 delt, biceps, 4- triceps, 3+WE, 3+/5 HI. LUE 3/5 deltoids, biceps, triceps and    3- to 3 WE and HI. BLE motor grossly 4/5 HF, KE and 4+ ADF/PF. Decreased LT in both arms. Senses pain and LT in both legs. No abnl resting tone. DTR's absent  Musculoskeletal: still with  head forward position, shoulders rotated inward.  Can improve it with cueing. Traps generally tight. No focal TP's appreciated   Assessment/Plan: 1. Functional deficits which require 3+ hours per day of interdisciplinary therapy in a comprehensive inpatient rehab setting. Physiatrist is providing close team supervision and 24 hour management of active medical problems listed below. Physiatrist and rehab team continue to assess barriers to discharge/monitor patient progress toward functional and medical goals  Care Tool:  Bathing    Body parts bathed by patient: Left arm, Chest, Abdomen, Front perineal area, Buttocks, Right upper leg, Left upper leg, Right lower leg, Left lower leg, Face, Right arm   Body parts bathed by helper: Right arm     Bathing assist Assist Level: Supervision/Verbal cueing     Upper Body Dressing/Undressing Upper body dressing   What is the patient wearing?: Pull over shirt    Upper body assist Assist Level: Supervision/Verbal cueing    Lower Body Dressing/Undressing Lower body dressing      What is the patient wearing?: Pants, Underwear/pull up     Lower body assist Assist for lower body dressing: Supervision/Verbal cueing     Toileting Toileting    Toileting assist Assist for toileting: Supervision/Verbal cueing     Transfers Chair/bed transfer  Transfers  assist     Chair/bed transfer assist level: Contact Guard/Touching assist     Locomotion Ambulation   Ambulation assist      Assist level: Minimal Assistance - Patient > 75% Assistive device: Walker-rolling Max distance: 80   Walk 10 feet activity   Assist     Assist level: Minimal Assistance - Patient > 75% Assistive device: Walker-rolling   Walk 50 feet activity   Assist    Assist level: Minimal Assistance - Patient > 75% Assistive device: Walker-rolling    Walk 150 feet activity   Assist Walk 150 feet activity did not occur: Safety/medical concerns          Walk 10 feet on uneven surface  activity   Assist Walk 10 feet on uneven surfaces activity did not occur: Safety/medical concerns         Wheelchair     Assist Is the patient using a wheelchair?: Yes Type of Wheelchair: Manual    Wheelchair assist level: Dependent - Patient 0% Max wheelchair distance: >150    Wheelchair 50 feet with 2 turns activity    Assist        Assist Level: Dependent - Patient 0%   Wheelchair 150 feet activity     Assist      Assist Level: Dependent - Patient 0%   Blood pressure (!) 149/88, pulse 64, temperature 97.7 F (36.5 C), temperature source Oral, resp. rate 18, height 6' (1.829 m), weight 73.9 kg, SpO2 100 %.  Medical Problem List and Plan: 1. 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 Patient is status post three-level anterior cervical decompression and fusion 5/30.              -patient may  shower with incision UNCOVERED             -ELOS/Goals: 09/19/22           -Continue CIR therapies including PT, OT  2.  Antithrombotics: -DVT/anticoagulation:     Lovenox 40mg  QD               -antiplatelet therapy: none   3. Pain Management: Tylenol, Norco 5 or 10, and Robaxin as needed - Valium 2 mg q6 hours prn for muscle tightness of neck - lidoderm 2 patches 8pm to 8am -6/14  gabapentin 600mg  tid  -add kpad for shoulder girdles  -has menthol cream as well as above meds  -discussed importance of improving his head and shoulder/neck posture in the short term and long term 4. Mood/Behavior/Sleep: LCSW to evaluate and provide emotional support             -antipsychotic agents: n/a - melatonin 5mg  QHS; also has trazodone PRN -sleep improved, cont regimen   5. Neuropsych/cognition: This patient is capable of making decisions on his own behalf.   6. Skin/Wound Care: Routine skin care checks             -monitor surgical incision   7.  Fluids/Electrolytes/Nutrition: Routine Is and Os and follow-up chemistries I personally reviewed the patient's labs today.  -Na+ holding at 130 6/10, other labs look good    8: Constipation due to neurogenic bowel: currently Colace 100 mg BID, Miralax QD, dulcolax 10mg  suppository QD             -had results with sorbitol yesterday -6/12-stopped evening suppository as he's having urge/sensation to empty  6/14 had bm last evening on his own.  9: History of chronic hepatitis C: completed treatment; follows with ID   10. Neurogenic bladder with urinary retention-   -timed voiding attempts every 6 hours. Scan patient after voids and have pt cathed if volumes >350cc-   - up to bsc, toilet, bladder massage and double voiding  -6/10 now voiding continently and with low PVR's (122)  11. Post-op dysphagia---due to cervical surgery. Struggling with solids -can continue D3 diet for now  - cepacol and chloraseptic spray PRN  12. Elevated BPs: -remotely on HCTZ 25mg  QD and Lisinopril 40mg  QD;  -6/9 HCTZ  restarted--added 10mg  lisinopril also 6/10 -6/14 improved control in general, consider increasing lisinopril Vitals:   08/31/22 1958 09/01/22 0417 09/01/22 1308 09/01/22 1525  BP: 128/73 115/67 100/71 126/76   09/01/22 1941 09/02/22 0400 09/02/22 1437 09/02/22 1826  BP: 120/77 123/76 123/77 115/73   09/03/22 0440 09/03/22 1313 09/03/22 2031 09/04/22 0407  BP: 126/83 116/70 126/80 (!) 149/88     13. Tobacco use: no cravings per H&P, encourage cessation on discharge.   LOS: 8 days A FACE TO FACE EVALUATION WAS PERFORMED  Ranelle Oyster 09/04/2022, 10:03 AM

## 2022-09-04 NOTE — Progress Notes (Signed)
Occupational Therapy Session Note  Patient Details  Name: Kenneth Flores MRN: [id number] Date of Birth: Sep 21, 1960  Today's Date: 09/04/2022 OT Individual Time: 1047-1200 OT Individual Time Calculation (min): 73 min    Short Term Goals: Week 2:  OT Short Term Goal 1 (Week 2): Pt will initiate complete HEP program with Mod I OT Short Term Goal 2 (Week 2): Pt will stand to complete shower with CGA with AE as necessary OT Short Term Goal 3 (Week 2): Pt will demonstrate improved scapular/shoulder stability in order to increase fine motor coordination while completing self care tasks  Skilled Therapeutic Interventions/Progress Updates:      Therapy Documentation Precautions:  Precautions Precautions: Fall, Cervical Precaution Comments: soft collar when OOB Required Braces or Orthoses: Cervical Brace Cervical Brace: Soft collar Restrictions Weight Bearing Restrictions: No  General: "Hi there." Pt seated in W/C upon OT arrival, agreeable to OT. Soft collar worn throughout session.  Pain: 0/10 pain reported/noted.     Balance: Pt ambulated from room> therapy gym SBA with RW and no LOB/SOB. Pt ambulated from therapy gym>room without AE CGA with VC for increased strides with arm swing, increased throughout ambulation, no LOB.    NM re-ed: Pt completed multiple trials of exercises targeting repetitive task practice for scapular protraction/retraction, pronation/supination, and FMC. Pt completed the following exercises with tactile, visual with mirror and verbal cues for limiting compensatory movements in LUE: -standing CGA; shoulder flexion up/down wall with Lt hand for multiple trials, then adding Rt hand as stabilizer -table slides seated with towel -seated;retrieving cards with pronated hand, supinating to flip over and place in pile, assist required for increased independence with retrieving cards -opening/closing BUE fists  PRN rest breaks during activities for muscle recovery. VC for  slow movements in order to increase muscle contraction.     Transfers: SBA for all sit to stands completed, SBA for functional mobility with RW, CGA for mobility without AE   Other Treatments:    -9 hole peg test: Pt completed Rt hand with 48 sec, Lt hand 3 min 52 sec, demonstrating increased impairments in Lt hand compared to Rt.   -Manual therapy: OT provided soft tissue massage to Lt and Rt upper trapezius d/t increased stiffness from compensatory movements. Decreased tightness noted after manual therapy.  Education: Pt educated on muscle recovery and how to limit compensatory strategies and the purpose of limiting compensatory strategies. Participated in therapeutic discussion of increased challenge during therapy in order to maximize pt outcomes.   -[place]o  Applied for 60 mins (unattended) to the Lt wrist extensors for NMR with the following parameters: 330 pulse width 35 Hz pulse rate On 8 sec/ off 8 sec Ramp up/ down 2 sec Symmetrical Biphasic wave form  Max intensity at 500 Ohm load Removed from pt at end of cycle with no adverse skin reaction or irritation noted.   Pt seated in W/C at end of session with W/C alarm donned, call light within reach and 4Ps assessed, friend present at end of session.   Therapy/Group: Individual Therapy  Velia Meyer, OTD, OTR/L 09/04/2022, 12:33 PM

## 2022-09-04 NOTE — Progress Notes (Signed)
Speech Language Pathology Weekly Progress and Session Note  Patient Details  Name: Kenneth Flores MRN: [id number] Date of Birth: [birth year]  Beginning of progress report period: August 28, 2022 End of progress report period: September 04, 2022  Today's Date: 09/04/2022 SLP Individual Time: 1430-1455 SLP Individual Time Calculation (min): 25 min  Short Term Goals: Week 1: SLP Short Term Goal 1 (Week 1): Patient will maintain adequate hydration/nutrition without overt s/sx of aspiration for the highest appropriate diet level. SLP Short Term Goal 1 - Progress (Week 1): Met SLP Short Term Goal 2 (Week 1): Patient will verbalize safe swallow strategies to increase safety with PO intake. SLP Short Term Goal 2 - Progress (Week 1): Met    New Short Term Goals: Week 2: SLP Short Term Goal 1 (Week 2): Patient will consume regular textures with thin liquids with minimal overt s/s of aspiration with mod I for use of swallowing compensatory strategies.  Weekly Progress Updates: Patient has made functional gains and has met 2 of 2 STGs this reporting period. Currently, patient is consuming regular textures with thin liquids with minimal overt s/s of aspiration with overall supervision-Mod I for use of swallowing compensatory strategies. Patient education ongoing. Patient was upgraded to regular textures today, therefore, SLP will follow-up briefly for tolerance. Continue with current plan of care.     Intensity: Minumum of 1-2 x/day, 30 to 90 minutes Frequency: 1 to 3 out of 7 days Duration/Length of Stay: 09/19/22 but probably shorter for SLP Treatment/Interventions: Cueing hierarchy;Dysphagia/aspiration precaution training;Patient/family education;Functional tasks;Environmental controls;Internal/external aids;Therapeutic Activities   Daily Session  Skilled Therapeutic Interventions:  Skilled treatment session focused on dysphagia goals. Upon arrival, patient was sitting EOB with several regular textured  snacks on his bedside table. SLP facilitated session by providing skilled observation with trials of regular textures. Patient took large bites but demonstrated efficient mastication and complete oral clearance without overt s/s of aspiration. Recommend patient upgrade to regular textures. Patient requested to use the bathroom and required verbal cues to donn soft collar before attempting to impulsively stand. Patient ambulated to the commode with CGA with the RW but was unable to void. Nursing made aware. Patient left upright in bed with alarm on and all needs within reach. Continue with current plan of care.     Pain Pain Assessment Pain Scale: 0-10 Pain Score: 7  Pain Type: Acute pain Pain Location: Shoulder Pain Orientation: Right Pain Descriptors / Indicators: Aching;Constant Pain Frequency: Constant Pain Onset: On-going Patients Stated Pain Goal: 2 Pain Intervention(s): Medication (See eMAR)  Therapy/Group: Individual Therapy  Kenneth Flores 09/04/2022, 3:06 PM

## 2022-09-04 NOTE — Progress Notes (Signed)
Physical Therapy Weekly Progress Note  Patient Details  Name: Kenneth Flores MRN: [id number] Date of Birth: [birth year]  Beginning of progress report period: August 28, 2022 End of progress report period: September 04, 2022  Today's Date: 09/04/2022 PT Individual Time: 4098-1191 PT Individual Time Calculation (min): 45 min   Patient has met 2 of 4 short term goals.  2 goals discontinued d/t pt progress as he is no longer expected to d/c at w/c level. Pt is progressing well, CGA overall with RW wrist splint and navigating stairs with CGA-min A. Pt has attempted ambulation without AD for balance training with good success and min A.   Patient continues to demonstrate the following deficits muscle weakness and muscle paralysis, impaired timing and sequencing, unbalanced muscle activation, and decreased coordination, and decreased standing balance, decreased balance strategies, and difficulty maintaining precautions and therefore will continue to benefit from skilled PT intervention to increase functional independence with mobility.  Patient progressing toward long term goals..  Plan of care revisions: Upgraded goals d/t progress.  PT Short Term Goals Week 1:  PT Short Term Goal 1 (Week 1): pt will initiate self propulsion of WC with bil UE's. PT Short Term Goal 1 - Progress (Week 1): Met PT Short Term Goal 2 (Week 1): pt will begin management of WC features to be able to set up transfers. PT Short Term Goal 2 - Progress (Week 1): Discontinued (comment) (goal no longer appropriate d/t anticiapted ambulatory level at d/c) PT Short Term Goal 3 (Week 1): patient will complete slide board transfer at [facility] level. PT Short Term Goal 3 - Progress (Week 1): Discontinued (comment) (goal no longer appropriate d/t anticiapted ambulatory level at d/c) PT Short Term Goal 4 (Week 1): pt will ambulate 100' with min assist with RW. PT Short Term Goal 4 - Progress (Week 1): Met Week 2:  PT Short Term Goal 1 (Week 2): Pt will  navigate stairs with single handrail PT Short Term Goal 2 (Week 2): Pt will ambulate >100 ft without AD PT Short Term Goal 3 (Week 2): Pt will navigate 6" hurdles no LOB  Skilled Therapeutic Interventions/Progress Updates:  pt received in bed and agreeable to therapy. Sit to stand with CGA-supervision throughout session. Pt ambulated to/from therapy gym with RW and L wrist splint. Session focused on stair navigation for endurance and strength. Forward navigation with BHR and alternating pattern, x 12 with CGA. Pt then performed 3 bouts of 3 x 4 steps with lateral technique each side with no rest breaks for lateral hip and eccentric quad strength. Then donned 4lb ankle weights for proprioception and intensity. Performed several rounds of figure 8s with cones placed 8 ft apart with no AD. Min a overall with very minor LOB which pt was largely able to correct. Pt then returned to room, demoing good carryover of improved coordination with weight removed. Pt remained in w/c, was left with all needs in reach and alarm active.   Therapy Documentation Precautions:  Precautions Precautions: Fall, Cervical Precaution Comments: soft collar when OOB Required Braces or Orthoses: Cervical Brace Cervical Brace: Soft collar Restrictions Weight Bearing Restrictions: No General:     Therapy/Group: Individual Therapy  [name] 09/04/2022, 4:36 PM

## 2022-09-05 NOTE — Progress Notes (Signed)
PROGRESS NOTE   Subjective/Complaints:  Doing well, slept well overnight. Pain well controlled. LBM yesterday. Urinating well. Denies any other complaints or concerns.   ROS: as per HPI. Denies CP, SOB, abd pain, N/V/D/C, or any other complaints at this time.     Objective:   No results found. No results for input(s): "WBC", "HGB", "HCT", "PLT" in the last 72 hours.  No results for input(s): "NA", "K", "CL", "CO2", "GLUCOSE", "BUN", "CREATININE", "CALCIUM" in the last 72 hours.  No intake or output data in the 24 hours ending 09/05/22 1030       Physical Exam: Vital Signs Blood pressure (!) 129/91, pulse 81, temperature 98.1 F (36.7 C), temperature source Oral, resp. rate 18, height 6' (1.829 m), weight 73.9 kg, SpO2 100 %.  Constitutional: No distress . Vital signs reviewed. Sitting at EOB HEENT: NCAT, EOMI, oral membranes moist Neck: steristrips off neck.  Cardiovascular: RRR without murmur. No JVD    Respiratory/Chest: CTA Bilaterally without wheezes or rales. Normal effort    GI/Abdomen: BS +, non-tender, non-distended Ext: no clubbing, cyanosis, or edema Psych: pleasant and cooperative, laughing more today Skin: incision Clean and intact without signs of breakdown  PRIOR EXAMS: Neuro:  Alert and oriented x 3. Normal insight and awareness. Intact Memory. Normal language and speech. Cranial nerve exam unremarkable. MMT: RUE 4/5 delt, biceps, 4- triceps, 3+WE, 3+/5 HI. LUE 3/5 deltoids, biceps, triceps and    3- to 3 WE and HI. BLE motor grossly 4/5 HF, KE and 4+ ADF/PF. Decreased LT in both arms. Senses pain and LT in both legs. No abnl resting tone. DTR's absent  Musculoskeletal: still with head forward position, shoulders rotated inward.  Can improve it with cueing. Traps generally tight. No focal TP's appreciated   Assessment/Plan: 1. Functional deficits which require 3+ hours per day of interdisciplinary  therapy in a comprehensive inpatient rehab setting. Physiatrist is providing close team supervision and 24 hour management of active medical problems listed below. Physiatrist and rehab team continue to assess barriers to discharge/monitor patient progress toward functional and medical goals  Care Tool:  Bathing    Body parts bathed by patient: Left arm, Chest, Abdomen, Front perineal area, Buttocks, Right upper leg, Left upper leg, Right lower leg, Left lower leg, Face, Right arm   Body parts bathed by helper: Right arm     Bathing assist Assist Level: Supervision/Verbal cueing     Upper Body Dressing/Undressing Upper body dressing   What is the patient wearing?: Pull over shirt    Upper body assist Assist Level: Supervision/Verbal cueing    Lower Body Dressing/Undressing Lower body dressing      What is the patient wearing?: Pants, Underwear/pull up     Lower body assist Assist for lower body dressing: Supervision/Verbal cueing     Toileting Toileting    Toileting assist Assist for toileting: Supervision/Verbal cueing     Transfers Chair/bed transfer  Transfers assist     Chair/bed transfer assist level: Contact Guard/Touching assist     Locomotion Ambulation   Ambulation assist      Assist level: Minimal Assistance - Patient > 75% Assistive device: Walker-rolling Max distance: 80  Walk 10 feet activity   Assist     Assist level: Minimal Assistance - Patient > 75% Assistive device: Walker-rolling   Walk 50 feet activity   Assist    Assist level: Minimal Assistance - Patient > 75% Assistive device: Walker-rolling    Walk 150 feet activity   Assist Walk 150 feet activity did not occur: Safety/medical concerns         Walk 10 feet on uneven surface  activity   Assist Walk 10 feet on uneven surfaces activity did not occur: Safety/medical concerns         Wheelchair     Assist Is the patient using a wheelchair?:  Yes Type of Wheelchair: Manual    Wheelchair assist level: Dependent - Patient 0% Max wheelchair distance: >150    Wheelchair 50 feet with 2 turns activity    Assist        Assist Level: Dependent - Patient 0%   Wheelchair 150 feet activity     Assist      Assist Level: Dependent - Patient 0%   Blood pressure (!) 129/91, pulse 81, temperature 98.1 F (36.7 C), temperature source Oral, resp. rate 18, height 6' (1.829 m), weight 73.9 kg, SpO2 100 %.  Medical Problem List and Plan: 1. 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 Patient is status post three-level anterior cervical decompression and fusion 5/30.              -patient may  shower with incision UNCOVERED             -ELOS/Goals: 09/19/22           -Continue CIR therapies including PT, OT  2.  Antithrombotics: -DVT/anticoagulation:   Lovenox 40mg  QD               -antiplatelet therapy: none   3. Pain Management: Tylenol, Norco 5 or 10, and Robaxin as needed - Valium 2 mg q6 hours prn for muscle tightness of neck - lidoderm 2 patches 8pm to 8am -6/14  gabapentin 600mg  tid  -add kpad for shoulder girdles  -has menthol cream as well as above meds -discussed importance of improving his head and shoulder/neck posture in the short term and long term -09/05/22 pain well controlled, cont regimen 4. Mood/Behavior/Sleep: LCSW to evaluate and provide emotional support             -antipsychotic agents: n/a - melatonin 5mg  QHS; also has trazodone PRN -sleep improved, cont regimen   5. Neuropsych/cognition: This patient is capable of making decisions on his own behalf.   6. Skin/Wound Care: Routine skin care checks             -monitor surgical incision   7. Fluids/Electrolytes/Nutrition: Routine Is and Os and follow-up chemistries -Na+ holding at 130 6/10, other labs look good    8: Constipation due to neurogenic bowel: currently  Colace 100 mg BID, Miralax QD, dulcolax 10mg  suppository QD             -had results with sorbitol yesterday -6/12-stopped evening suppository as he's having urge/sensation to empty  -6/14 had bm last evening on his own.  -09/05/22 LBM last night, cont regimen  9: History of chronic hepatitis C: completed treatment; follows with ID   10. Neurogenic bladder with urinary retention-   -timed voiding attempts every 6 hours. Scan patient after voids and have pt cathed if volumes >350cc-   -  up to bsc, toilet, bladder massage and double voiding  -6/10 now voiding continently and with low PVR's (122)  11. Post-op dysphagia---due to cervical surgery. Struggling with solids -can continue D3 diet for now  - cepacol and chloraseptic spray PRN  12. Elevated BPs: -remotely on HCTZ 25mg  QD and Lisinopril 40mg  QD;  -6/9 HCTZ  restarted--added 10mg  lisinopril also 6/10 -6/14 improved control in general, consider increasing lisinopril -09/05/22 improving control, cont regimen.  Vitals:   09/01/22 1525 09/01/22 1941 09/02/22 0400 09/02/22 1437  BP: 126/76 120/77 123/76 123/77   09/02/22 1826 09/03/22 0440 09/03/22 1313 09/03/22 2031  BP: 115/73 126/83 116/70 126/80   09/04/22 0407 09/04/22 1501 09/04/22 2017 09/05/22 0308  BP: (!) 149/88 131/84 119/80 (!) 129/91     13. Tobacco use: no cravings per H&P, encourage cessation on discharge.   LOS: 9 days A FACE TO FACE EVALUATION WAS PERFORMED  [address] 09/05/2022, 10:30 AM

## 2022-09-05 NOTE — Progress Notes (Signed)
Physical Therapy Session Note  Patient Details  Name: Kenneth Flores MRN: [id number] Date of Birth: 1961-03-17  Today's Date: 09/05/2022 PT Individual Time: 0920-1031 PT Individual Time Calculation (min): 71 min   Short Term Goals: Week 2:  PT Short Term Goal 1 (Week 2): Pt will navigate stairs with single handrail PT Short Term Goal 2 (Week 2): Pt will ambulate >100 ft without AD PT Short Term Goal 3 (Week 2): Pt will navigate 6" hurdles no LOB  Skilled Therapeutic Interventions/Progress Updates: Pt presents sitting on EOB and ready for therapy.  Pt transfers sit to stand w/ supervision and amb to w/c.  Pt wheeled to main gym for time conservation.  Pt performed multiple exercises to challenge balance w/ and w/o AD.  Pt performed toe taps to 3 3/4" platform w/ min A steadying and cues for speed to maintain balance and 4# AW.  Pt performed step over hurdles during fluid gait w/ either foot stepping over.  Note left foot drags over hurdle when right foot leads.  Pt amb over floor mat as part of longer gait w/ CGA for minimal LOB.  Pt performs floor ladder but then also toe taps cone on perimeter of ladder w/ alternating foot before stepping into that square, no 2 feet in same square.  Pt does knock down 1-2 cones to left side and somewhat frustrated.  Encouraged him on progress and that challenging him is a sign of progress.  Pt amb holding yellow T-ball, w/o LOB and maintaining proper positioning of feet.  Pt amb multiple trials w/ RW and CGA to close supervision of 200'.  Pt returned to room and remained sitting in w/c w/ chair alarm on and all needs in reach.     Therapy Documentation Precautions:  Precautions Precautions: Fall, Cervical Precaution Comments: soft collar when OOB Required Braces or Orthoses: Cervical Brace Cervical Brace: Soft collar Restrictions Weight Bearing Restrictions: No General:   Vital Signs:  Pain:3/10 between shoulder blades. Pain Assessment Pain Scale:  0-10 Pain Score: 5  Pain Type: Acute pain Pain Location: Neck Pain Orientation: Posterior Pain Descriptors / Indicators: Aching Pain Onset: On-going Pain Intervention(s): Medication (See eMAR)     Therapy/Group: Individual Therapy  Lucio Edward 09/05/2022, 10:37 AM

## 2022-09-06 NOTE — Progress Notes (Signed)
Physical Therapy Session Note  Patient Details  Name: Kenneth Flores MRN: [id number] Date of Birth: 06-23-1960  Today's Date: 09/06/2022 PT Individual Time: 0915-1000 PT Individual Time Calculation (min): 45 min   Short Term Goals: Week 2:  PT Short Term Goal 1 (Week 2): Pt will navigate stairs with single handrail PT Short Term Goal 2 (Week 2): Pt will ambulate >100 ft without AD PT Short Term Goal 3 (Week 2): Pt will navigate 6" hurdles no LOB  Skilled Therapeutic Interventions/Progress Updates:   Pt found sitting EOB without cervical collar on and wife present. Pt reports pain to be 7/10 and nursing had already given pain meds. Pt agreeable to treatment. Pt donned neck collar.  Gait training: with and without RW > 200 ft each with PT light CGA to min A at times without RW due to slight stumble. Obstacle negotiation and turns performed for further balance challenge. Pt uses a high guard position to assist with balance when ambulating without an AD. Pt also noted with fatigue to have L knee recurvatum in stance phase. Once pointed out and education given on how to correct, knee recurvatum improved.   NMR:  -Step taps onto 6" step frontally x 20 unilaterally bilaterally with PT light CGA -Retro gait with concentration on balance without AD x 32 ft with PT light CGA for safety -Stepping over cones each LE leading x 2 laps x 20 ft each -Lateral side steps for glute med strengthening x 8 ft x 4 bouts with PT CGA for safety -Weaving around cones performed x 12 ft x 4 bouts for further balance challenge -Standing weightshift activities performed for further balance challenge with PT CGA for safety  Pt returned to room EOB without any increased pain from therapy initiation. Nursing arrived at end of treatment to perform procedure. Pt left in nursing care for procedure and supervision to toileting prior to procedure.        Therapy Documentation Precautions:  Precautions Precautions: Fall,  Cervical Precaution Comments: soft collar when OOB Required Braces or Orthoses: Cervical Brace Cervical Brace: Soft collar Restrictions Weight Bearing Restrictions: No     Therapy/Group: Individual Therapy  Luna Fuse 09/06/2022, 7:29 AM

## 2022-09-06 NOTE — Progress Notes (Signed)
PROGRESS NOTE   Subjective/Complaints:  Doing well again today, slept well overnight. Pain well controlled, still having the nerve pain in the shoulders/arms/hands, but will monitor this week to see how he feels the gabapentin is helping-- will discuss with weekday team. LBM yesterday. Urinating well, no retention. Denies any other complaints or concerns.   ROS: as per HPI. Denies CP, SOB, abd pain, N/V/D/C, or any other complaints at this time.     Objective:   No results found. No results for input(s): "WBC", "HGB", "HCT", "PLT" in the last 72 hours.  No results for input(s): "NA", "K", "CL", "CO2", "GLUCOSE", "BUN", "CREATININE", "CALCIUM" in the last 72 hours.   Intake/Output Summary (Last 24 hours) at 09/06/2022 1037 Last data filed at 09/06/2022 0734 Gross per 24 hour  Intake 1066 ml  Output --  Net 1066 ml         Physical Exam: Vital Signs Blood pressure 132/72, pulse 75, temperature (!) 97 F (36.1 C), temperature source Axillary, resp. rate 18, height 6' (1.829 m), weight 73.9 kg, SpO2 98 %.  Constitutional: No distress . Vital signs reviewed. Sitting in bed HEENT: NCAT, EOMI, oral membranes moist Neck: steristrips off neck.  Cardiovascular: RRR without murmur. No JVD    Respiratory/Chest: CTA Bilaterally without wheezes or rales. Normal effort    GI/Abdomen: BS +, non-tender, non-distended Ext: no clubbing, cyanosis, or edema Psych: pleasant and cooperative, in good spirits Skin: incision Clean and intact without signs of breakdown  PRIOR EXAMS: Neuro:  Alert and oriented x 3. Normal insight and awareness. Intact Memory. Normal language and speech. Cranial nerve exam unremarkable. MMT: RUE 4/5 delt, biceps, 4- triceps, 3+WE, 3+/5 HI. LUE 3/5 deltoids, biceps, triceps and    3- to 3 WE and HI. BLE motor grossly 4/5 HF, KE and 4+ ADF/PF. Decreased LT in both arms. Senses pain and LT in both legs. No abnl  resting tone. DTR's absent  Musculoskeletal: still with head forward position, shoulders rotated inward.  Can improve it with cueing. Traps generally tight. No focal TP's appreciated   Assessment/Plan: 1. Functional deficits which require 3+ hours per day of interdisciplinary therapy in a comprehensive inpatient rehab setting. Physiatrist is providing close team supervision and 24 hour management of active medical problems listed below. Physiatrist and rehab team continue to assess barriers to discharge/monitor patient progress toward functional and medical goals  Care Tool:  Bathing    Body parts bathed by patient: Left arm, Chest, Abdomen, Front perineal area, Buttocks, Right upper leg, Left upper leg, Right lower leg, Left lower leg, Face, Right arm   Body parts bathed by helper: Right arm     Bathing assist Assist Level: Supervision/Verbal cueing     Upper Body Dressing/Undressing Upper body dressing   What is the patient wearing?: Pull over shirt    Upper body assist Assist Level: Supervision/Verbal cueing    Lower Body Dressing/Undressing Lower body dressing      What is the patient wearing?: Pants, Underwear/pull up     Lower body assist Assist for lower body dressing: Supervision/Verbal cueing     Toileting Toileting    Toileting assist Assist for toileting: Supervision/Verbal  cueing     Transfers Chair/bed transfer  Transfers assist     Chair/bed transfer assist level: Contact Guard/Touching assist     Locomotion Ambulation   Ambulation assist      Assist level: Contact Guard/Touching assist Assistive device: Walker-rolling Max distance: 200   Walk 10 feet activity   Assist     Assist level: Contact Guard/Touching assist Assistive device: Walker-rolling   Walk 50 feet activity   Assist    Assist level: Contact Guard/Touching assist Assistive device: Walker-rolling    Walk 150 feet activity   Assist Walk 150 feet activity  did not occur: Safety/medical concerns  Assist level: Contact Guard/Touching assist Assistive device: Walker-rolling    Walk 10 feet on uneven surface  activity   Assist Walk 10 feet on uneven surfaces activity did not occur: Safety/medical concerns         Wheelchair     Assist Is the patient using a wheelchair?: Yes Type of Wheelchair: Manual    Wheelchair assist level: Dependent - Patient 0% Max wheelchair distance: >150    Wheelchair 50 feet with 2 turns activity    Assist        Assist Level: Dependent - Patient 0%   Wheelchair 150 feet activity     Assist      Assist Level: Dependent - Patient 0%   Blood pressure 132/72, pulse 75, temperature (!) 97 F (36.1 C), temperature source Axillary, resp. rate 18, height 6' (1.829 m), weight 73.9 kg, SpO2 98 %.  Medical Problem List and Plan: 1. 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 Patient is status post three-level anterior cervical decompression and fusion 5/30.              -patient may  shower with incision UNCOVERED             -ELOS/Goals: 09/19/22           -Continue CIR therapies including PT, OT  2.  Antithrombotics: -DVT/anticoagulation:   Lovenox 40mg  QD               -antiplatelet therapy: none   3. Pain Management: Tylenol, Norco 5 or 10, and Robaxin as needed - Valium 2 mg q6 hours prn for muscle tightness of neck - lidoderm 2 patches 8pm to 8am -6/14  gabapentin 600mg  tid  -add kpad for shoulder girdles  -has menthol cream as well as above meds -discussed importance of improving his head and shoulder/neck posture in the short term and long term -6/15-16/24 pain well controlled, cont regimen, will discuss with weekday team if gabapentin is helping or if he wants to try increasing dose--objectively seems to be less bothered by the paresthesias, but still reports them being present and bothersome 4.  Mood/Behavior/Sleep: LCSW to evaluate and provide emotional support             -antipsychotic agents: n/a - melatonin 5mg  QHS; also has trazodone PRN -sleep improved, cont regimen   5. Neuropsych/cognition: This patient is capable of making decisions on his own behalf.   6. Skin/Wound Care: Routine skin care checks             -monitor surgical incision   7. Fluids/Electrolytes/Nutrition: Routine Is and Os and follow-up chemistries -Na+ holding at 130 6/10, other labs look good    8: Constipation due to neurogenic bowel: currently Colace 100 mg BID, Miralax QD, dulcolax 10mg  suppository QD             -  had results with sorbitol yesterday -6/12-stopped evening suppository as he's having urge/sensation to empty  -6/14 had bm last evening on his own.  -09/06/22 LBM last night, cont regimen  9: History of chronic hepatitis C: completed treatment; follows with ID   10. Neurogenic bladder with urinary retention-   -timed voiding attempts every 6 hours. Scan patient after voids and have pt cathed if volumes >350cc-   - up to bsc, toilet, bladder massage and double voiding  -6/10 now voiding continently and with low PVR's (122)  -09/06/22 no ongoing retention  11. Post-op dysphagia---due to cervical surgery. Struggling with solids -can continue D3 diet for now  - cepacol and chloraseptic spray PRN  12. Elevated BPs: -remotely on HCTZ 25mg  QD and Lisinopril 40mg  QD;  -6/9 HCTZ  restarted--added 10mg  lisinopril also 6/10 -6/14 improved control in general, consider increasing lisinopril -6/15-16/24 improving control, cont regimen.  Vitals:   09/02/22 1437 09/02/22 1826 09/03/22 0440 09/03/22 1313  BP: 123/77 115/73 126/83 116/70   09/03/22 2031 09/04/22 0407 09/04/22 1501 09/04/22 2017  BP: 126/80 (!) 149/88 131/84 119/80   09/05/22 0308 09/05/22 1304 09/05/22 1933 09/06/22 0610  BP: (!) 129/91 121/79 111/78 132/72     13. Tobacco use: no cravings per H&P, encourage cessation on  discharge.   LOS: 10 days A FACE TO FACE EVALUATION WAS PERFORMED  [address] Zayah Keilman 09/06/2022, 10:37 AM

## 2022-09-07 LAB — BASIC METABOLIC PANEL
Anion gap: 12 (ref 5–15)
BUN: 23 mg/dL — ABNORMAL HIGH (ref 6–20)
CO2: 28 mmol/L (ref 22–32)
Calcium: 9.1 mg/dL (ref 8.9–10.3)
Chloride: 96 mmol/L — ABNORMAL LOW (ref 98–111)
Creatinine, Ser: 0.85 mg/dL (ref 0.61–1.24)
GFR, Estimated: >60 mL/min (ref >60)
Glucose, Bld: 101 mg/dL — ABNORMAL HIGH (ref 70–99)
Potassium: 4 mmol/L (ref 3.5–5.1)
Sodium: 136 mmol/L (ref 135–145)

## 2022-09-07 LAB — CBC
HCT: 38.2 % — ABNORMAL LOW (ref 39.0–52.0)
Hemoglobin: 13.8 g/dL (ref 13.0–17.0)
MCH: 33.3 pg (ref 26.0–34.0)
MCHC: 36.1 g/dL — ABNORMAL HIGH (ref 30.0–36.0)
MCV: 92.3 fL (ref 80.0–100.0)
Platelets: 163 10*3/uL (ref 150–400)
RBC: 4.14 MIL/uL — ABNORMAL LOW (ref 4.22–5.81)
RDW: 11.4 % — ABNORMAL LOW (ref 11.5–15.5)
WBC: 5.9 10*3/uL (ref 4.0–10.5)
nRBC: 0 % (ref 0.0–0.2)

## 2022-09-07 MED ORDER — DULOXETINE HCL 30 MG PO CPEP
30.0000 mg | ORAL_CAPSULE | Freq: Every day | ORAL | Status: DC
  Administered 2022-09-07 – 2022-09-10 (×4): 30 mg via ORAL
  Filled 2022-09-07 (×4): qty 1

## 2022-09-07 NOTE — Plan of Care (Signed)
  Problem: RH Swallowing Goal: LTG Patient will consume least restrictive diet using compensatory strategies with assistance (SLP) Description: LTG:  Patient will consume least restrictive diet using compensatory strategies with assistance (SLP) Outcome: Completed/Met   

## 2022-09-07 NOTE — Progress Notes (Signed)
Occupational Therapy Session Note  Patient Details  Name: Kenneth Flores MRN: [id number] Date of Birth: 25-Apr-1960  Today's Date: 09/07/2022 OT Individual Time: 0930-1015 OT Individual Time Calculation (min): 45 min    Short Term Goals: Week 2:  OT Short Term Goal 1 (Week 2): Pt will initiate complete HEP program with Mod I OT Short Term Goal 2 (Week 2): Pt will stand to complete shower with CGA with AE as necessary OT Short Term Goal 3 (Week 2): Pt will demonstrate improved scapular/shoulder stability in order to increase fine motor coordination while completing self care tasks  Skilled Therapeutic Interventions/Progress Updates:    OT intervention with focus on functional amb with RW, tub transfers, home safety recommendations, LUE GMC/FMC, and standing balance to increase independence with BADLs. Pt amb with RW to ADL apartment. Block practice sit<>stand from lower surface (couch) x 5 with supervision. Pt practice stepping over into tub and using TTB. Pt in agreement that TTB is the safer method for taking shower at home. Pt amb with RW to gym and engaged in tasks with focus on supination/pronation in addition to [facility].   Box and Blocks Test measures unilateral gross manual dexterity. - Instructions The pt was instructed to carry one block over at a time and go as quickly as they could, making sure their fingertips crossed the partition. One minute was given to complete the task per UE. The pt was allowed a 15-second trial period prior to testing if needed. - Results The pt transferred *41blocks with the R hand and 27 with the L hand. The total number of blocks carried from one compartment to the other in one minute is scored per hand. Higher scores on the test indicate better gross manual dexterity.  - Norms for adults males 50-75+ -50-54 R 79 L 77.0 -55-59 R 75.2 L 73.8 -60-64 R 71.3 L 70.5 -65-69 R 68.5 L 67.4 -70-74 R 66.3 L 64.33 -75+ R 63.0 L 61.3  Pt returned to room and  requested to use toilet. Amb with RW to bathroom. Pt stood at toilet to void-CGA. Pt returned to room and stood at sink to wash hands before returning to w/c. Pt assisted with donning belt alarm. NT present for bladder scan. All needs within reach.    Therapy Documentation Precautions:  Precautions Precautions: Fall, Cervical Precaution Comments: soft collar when OOB Required Braces or Orthoses: Cervical Brace Cervical Brace: Soft collar Restrictions Weight Bearing Restrictions: No Pain: Pain Assessment Pain Scale: 0-10 Pain Score: 7  Pain Type: Acute pain Pain Location: Neck MD aware, rest   Therapy/Group: Individual Therapy  [name] 09/07/2022, 10:24 AM

## 2022-09-07 NOTE — Progress Notes (Signed)
Physical Therapy Session Note  Patient Details  Name: Kenneth Flores MRN: [id number] Date of Birth: 10-28-1960  Today's Date: 09/07/2022 PT Individual Time: 1455-1547 PT Individual Time Calculation (min): 52 min   Short Term Goals: Week 1:  PT Short Term Goal 1 (Week 1): pt will initiate self propulsion of WC with bil UE's. PT Short Term Goal 1 - Progress (Week 1): Met PT Short Term Goal 2 (Week 1): pt will begin management of WC features to be able to set up transfers. PT Short Term Goal 2 - Progress (Week 1): Discontinued (comment) (goal no longer appropriate d/t anticiapted ambulatory level at d/c) PT Short Term Goal 3 (Week 1): patient will complete slide board transfer at [facility] level. PT Short Term Goal 3 - Progress (Week 1): Discontinued (comment) (goal no longer appropriate d/t anticiapted ambulatory level at d/c) PT Short Term Goal 4 (Week 1): pt will ambulate 100' with min assist with RW. PT Short Term Goal 4 - Progress (Week 1): Met Week 2:  PT Short Term Goal 1 (Week 2): Pt will navigate stairs with single handrail PT Short Term Goal 2 (Week 2): Pt will ambulate >100 ft without AD PT Short Term Goal 3 (Week 2): Pt will navigate 6" hurdles no LOB Week 3:     Skilled Therapeutic Interventions/Progress Updates:      Therapy Documentation Precautions:  Precautions Precautions: Fall, Cervical Precaution Comments: soft collar when OOB Required Braces or Orthoses: Cervical Brace Cervical Brace: Soft collar Restrictions Weight Bearing Restrictions: No General:   Vital Signs:   Pain: Pain Assessment Pain Score: 7  Mobility:   Locomotion :    Trunk/Postural Assessment :    Balance:   Exercises:   Other Treatments:      Therapy/Group: Individual Therapy  Loel Dubonnet PT, DPT, CSRS 09/07/2022, 6:39 PM

## 2022-09-07 NOTE — Progress Notes (Signed)
PROGRESS NOTE   Subjective/Complaints:  Pt reports still having nerve pain as well as pain in shoulders and neck and hands- 7/10- is tolerable- but sounds like mixture of nerve pain and myofascial pain.   Stopped supp last week Walking without RW per pt but has knee buckling sometimes- with therapy.   LBM yesterday- going daily with Miralax.   ROS:  Pt denies SOB, abd pain, CP, N/V/C/D, and vision changes  Except for HPI  Objective:   No results found. Recent Labs    09/07/22 0558  WBC 5.9  HGB 13.8  HCT 38.2*  PLT 163    Recent Labs    09/07/22 0558  NA 136  K 4.0  CL 96*  CO2 28  GLUCOSE 101*  BUN 23*  CREATININE 0.85  CALCIUM 9.1     Intake/Output Summary (Last 24 hours) at 09/07/2022 0846 Last data filed at 09/07/2022 0724 Gross per 24 hour  Intake 834 ml  Output --  Net 834 ml         Physical Exam: Vital Signs Blood pressure 114/66, pulse 70, temperature 97.7 F (36.5 C), temperature source Oral, resp. rate 18, height 6' (1.829 m), weight 73.9 kg, SpO2 98 %.    General: awake, alert, appropriate, sitting EOB trying to finish eating then put shirt on; NAD HENT: conjugate gaze; oropharynx moist- glasses on CV: regular rate and rhythm; no JVD Pulmonary: CTA B/L; no W/R/R- good air movement GI: soft, NT, ND, (+)BS- normoactive Psychiatric: appropriate- pleasant Neurological: Ox3 Obviously has difficulty with arms- difficulty putting on shirt Skin: incision Clean and intact without signs of breakdown  PRIOR EXAMS: Neuro:  Alert and oriented x 3. Normal insight and awareness. Intact Memory. Normal language and speech. Cranial nerve exam unremarkable. MMT: RUE 4/5 delt, biceps, 4- triceps, 3+WE, 3+/5 HI. LUE 3/5 deltoids, biceps, triceps and    3- to 3 WE and HI. BLE motor grossly 4/5 HF, KE and 4+ ADF/PF. Decreased LT in both arms. Senses pain and LT in both legs. No abnl resting tone. DTR's  absent  Musculoskeletal: still with head forward position, shoulders rotated inward.  Can improve it with cueing. Traps generally tight. No focal TP's appreciated   Assessment/Plan: 1. Functional deficits which require 3+ hours per day of interdisciplinary therapy in a comprehensive inpatient rehab setting. Physiatrist is providing close team supervision and 24 hour management of active medical problems listed below. Physiatrist and rehab team continue to assess barriers to discharge/monitor patient progress toward functional and medical goals  Care Tool:  Bathing    Body parts bathed by patient: Left arm, Chest, Abdomen, Front perineal area, Buttocks, Right upper leg, Left upper leg, Right lower leg, Left lower leg, Face, Right arm   Body parts bathed by helper: Right arm     Bathing assist Assist Level: Supervision/Verbal cueing     Upper Body Dressing/Undressing Upper body dressing   What is the patient wearing?: Pull over shirt    Upper body assist Assist Level: Supervision/Verbal cueing    Lower Body Dressing/Undressing Lower body dressing      What is the patient wearing?: Pants, Underwear/pull up     Lower  body assist Assist for lower body dressing: Supervision/Verbal cueing     Toileting Toileting    Toileting assist Assist for toileting: Supervision/Verbal cueing     Transfers Chair/bed transfer  Transfers assist     Chair/bed transfer assist level: Contact Guard/Touching assist     Locomotion Ambulation   Ambulation assist      Assist level: Contact Guard/Touching assist Assistive device: Walker-rolling Max distance: 200   Walk 10 feet activity   Assist     Assist level: Contact Guard/Touching assist Assistive device: Walker-rolling   Walk 50 feet activity   Assist    Assist level: Contact Guard/Touching assist Assistive device: Walker-rolling    Walk 150 feet activity   Assist Walk 150 feet activity did not occur:  Safety/medical concerns  Assist level: Contact Guard/Touching assist Assistive device: Walker-rolling    Walk 10 feet on uneven surface  activity   Assist Walk 10 feet on uneven surfaces activity did not occur: Safety/medical concerns         Wheelchair     Assist Is the patient using a wheelchair?: Yes Type of Wheelchair: Manual    Wheelchair assist level: Dependent - Patient 0% Max wheelchair distance: >150    Wheelchair 50 feet with 2 turns activity    Assist        Assist Level: Dependent - Patient 0%   Wheelchair 150 feet activity     Assist      Assist Level: Dependent - Patient 0%   Blood pressure 114/66, pulse 70, temperature 97.7 F (36.5 C), temperature source Oral, resp. rate 18, height 6' (1.829 m), weight 73.9 kg, SpO2 98 %.  Medical Problem List and Plan: 1. 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 Patient is status post three-level anterior cervical decompression and fusion 5/30.              -patient may  shower with incision UNCOVERED             -ELOS/Goals: 09/19/22           -Con't CIR PT and OT 2.  Antithrombotics: -DVT/anticoagulation:   Lovenox 40mg  QD               -antiplatelet therapy: none   3. Pain Management: Tylenol, Norco 5 or 10, and Robaxin as needed - Valium 2 mg q6 hours prn for muscle tightness of neck - lidoderm 2 patches 8pm to 8am -6/14  gabapentin 600mg  tid  -add kpad for shoulder girdles  -has menthol cream as well as above meds -discussed importance of improving his head and shoulder/neck posture in the short term and long term -6/15-16/24 pain well controlled, cont regimen, will discuss with weekday team if gabapentin is helping or if he wants to try increasing dose--objectively seems to be less bothered by the paresthesias, but still reports them being present and bothersome 6/17- will add Cymbalta 30 mg at bedtime for nerve  pain- will have pt have lidoderm put on neck and if doesn't work, might benefit from Trp injections.  4. Mood/Behavior/Sleep: LCSW to evaluate and provide emotional support             -antipsychotic agents: n/a - melatonin 5mg  QHS; also has trazodone PRN -sleep improved, cont regimen   5. Neuropsych/cognition: This patient is capable of making decisions on his own behalf.   6. Skin/Wound Care: Routine skin care checks             -  monitor surgical incision   7. Fluids/Electrolytes/Nutrition: Routine Is and Os and follow-up chemistries -Na+ holding at 130 6/10, other labs look good    6/17- Na 136 today 8: Constipation due to neurogenic bowel: currently Colace 100 mg BID, Miralax QD, dulcolax 10mg  suppository QD             -had results with sorbitol yesterday -6/12-stopped evening suppository as he's having urge/sensation to empty  -6/14 had bm last evening on his own.  -09/06/22 LBM last night, cont regimen 6/17- going daily with miralax per pt 9: History of chronic hepatitis C: completed treatment; follows with ID   10. Neurogenic bladder with urinary retention-   -timed voiding attempts every 6 hours. Scan patient after voids and have pt cathed if volumes >350cc-   - up to bsc, toilet, bladder massage and double voiding  -6/10 now voiding continently and with low PVR's (122)  -09/06/22 no ongoing retention  11. Post-op dysphagia---due to cervical surgery. Struggling with solids -can continue D3 diet for now  - cepacol and chloraseptic spray PRN  12. Elevated BPs: -remotely on HCTZ 25mg  QD and Lisinopril 40mg  QD;  -6/9 HCTZ  restarted--added 10mg  lisinopril also 6/10 -6/14 improved control in general, consider increasing lisinopril -6/15-16/24 improving control, cont regimen.   6/17- BP doing better- con't regimen Vitals:   09/03/22 1313 09/03/22 2031 09/04/22 0407 09/04/22 1501  BP: 116/70 126/80 (!) 149/88 131/84   09/04/22 2017 09/05/22 0308 09/05/22 1304 09/05/22 1933   BP: 119/80 (!) 129/91 121/79 111/78   09/06/22 0610 09/06/22 1307 09/06/22 1906 09/07/22 0307  BP: 132/72 121/69 114/74 114/66     13. Tobacco use: no cravings per H&P, encourage cessation on discharge.     I spent a total of 36   minutes on total care today- >50% coordination of care- due to review of chart- since been gone- review of labs and prolonged time with pt reviewing medical issues and strength and pain issues  LOS: 11 days A FACE TO FACE EVALUATION WAS PERFORMED  Lyrica Mcclarty 09/07/2022, 8:46 AM

## 2022-09-07 NOTE — Progress Notes (Signed)
Physical Therapy Session Note  Patient Details  Name: Frazier Metzker MRN: [id number] Date of Birth: 07-Jul-1960  Today's Date: 09/07/2022 PT Individual Time: 1110-1203 PT Individual Time Calculation (min): 53 min   Short Term Goals: Week 1:  PT Short Term Goal 1 (Week 1): pt will initiate self propulsion of WC with bil UE's. PT Short Term Goal 1 - Progress (Week 1): Met PT Short Term Goal 2 (Week 1): pt will begin management of WC features to be able to set up transfers. PT Short Term Goal 2 - Progress (Week 1): Discontinued (comment) (goal no longer appropriate d/t anticiapted ambulatory level at d/c) PT Short Term Goal 3 (Week 1): patient will complete slide board transfer at [facility] level. PT Short Term Goal 3 - Progress (Week 1): Discontinued (comment) (goal no longer appropriate d/t anticiapted ambulatory level at d/c) PT Short Term Goal 4 (Week 1): pt will ambulate 100' with min assist with RW. PT Short Term Goal 4 - Progress (Week 1): Met Week 2:  PT Short Term Goal 1 (Week 2): Pt will navigate stairs with single handrail PT Short Term Goal 2 (Week 2): Pt will ambulate >100 ft without AD PT Short Term Goal 3 (Week 2): Pt will navigate 6" hurdles no LOB  Skilled Therapeutic Interventions/Progress Updates:    Pt seated in w/c on arrival and agreeable to therapy. Pt with unrated neck pain, addressed with manual therapy and k pad at end of session. Session focused on gait training, with discussion of AD use at home and safety at d/c. Pt ambulated throughout session with RW at supervision-CGA level. Without AD, ambulates at [facility] level.  Pt performed carrying tasks with BUE in very crowded gym  with no LOB. Pt then ambulated 3 x ~300 ft with tidal tank for balance challenge. Note increase in ataxia but no true LOB. Pt returned to room and to w/c. Therapist provided triggerpoint release at levator scap and upper trap BIL for pain relief. Pt remained in w/c with all needs in reach.  Therapy  Documentation Precautions:  Precautions Precautions: Fall, Cervical Precaution Comments: soft collar when OOB Required Braces or Orthoses: Cervical Brace Cervical Brace: Soft collar Restrictions Weight Bearing Restrictions: No General:     Therapy/Group: Individual Therapy  [name] 09/07/2022, 12:12 PM

## 2022-09-07 NOTE — Progress Notes (Signed)
Speech Language Pathology Discharge Summary  Patient Details  Name: Kenneth Flores MRN: [id number] Date of Birth: Oct 17, 1960  Date of Discharge from SLP service:September 07, 2022  Today's Date: 09/07/2022 SLP Individual Time: 4098-1191 SLP Individual Time Calculation (min): 25 min   Skilled Therapeutic Interventions:   Skilled treatment session focused on dysphagia goals. SLP facilitated session by providing skilled observation with breakfast meal of regular textures with thin liquids. Patient consumed meal without overt s/s of aspiration and was overall Mod I for use of swallowing compensatory strategies. Patient's swallowing function has returned to baseline and all education has been completed, therefore, skilled SLP intervention is no longer warranted at this time. Patient verbalized understanding and agreement. Patient left sitting EOB with alarm on and all needs within reach.   Patient has met 1 of 1 long term goals.  Patient to discharge at overall Modified Independent level.   Reasons goals not met: N/A   Clinical Impression/Discharge Summary: Patient has made excellent gains and has met 1 of 1 LTG this admission. Currently, patient is consuming regular textures with thin liquids without overt s/s of aspiration and is overall Mod I for use of swallowing compensatory strategies. Patient has met all goals and education is complete, therefore, patient will be discharged from skilled SLP intervention with follow-up not warranted at this time. Patient verbalized understanding and agreement.   Care Partner:  Caregiver Able to Provide Assistance: Yes     Recommendation:  None      Equipment: N/A   Reasons for discharge: Treatment goals met   Patient/Family Agrees with Progress Made and Goals Achieved: Yes    [name] 09/07/2022, 7:05 AM

## 2022-09-07 NOTE — Progress Notes (Signed)
Occupational Therapy Session Note  Patient Details  Name: Kenneth Flores MRN: [id number] Date of Birth: 1961-01-10  Today's Date: 09/07/2022 OT Individual Time: 1348-1430 OT Individual Time Calculation (min): 42 min    Short Term Goals: Week 2:  OT Short Term Goal 1 (Week 2): Pt will initiate complete HEP program with Mod I OT Short Term Goal 2 (Week 2): Pt will stand to complete shower with CGA with AE as necessary OT Short Term Goal 3 (Week 2): Pt will demonstrate improved scapular/shoulder stability in order to increase fine motor coordination while completing self care tasks  Skilled Therapeutic Interventions/Progress Updates:      Therapy Documentation Precautions:  Precautions Precautions: Fall, Cervical Precaution Comments: soft collar when OOB Required Braces or Orthoses: Cervical Brace Cervical Brace: Soft collar Restrictions Weight Bearing Restrictions: No General:   Vital Signs: Therapy Vitals Temp: 98.5 F (36.9 C) Temp Source: Oral Pulse Rate: 77 Resp: 18 BP: 113/67 Patient Position (if appropriate): Sitting Oxygen Therapy SpO2: 99 % O2 Device: Room Air Pain:   ADL: Toilet transfer: SBA ambulating to toilet at [employer]: SBA, able to manage pants over hips, standing to continently void urine Grooming: SBA standing at sink for hand hygiene Transfers/functional mobility: SBA for community ambulation room>< therapy gym 2X with no rest breaks required, no LOB/SOB, VC for increased step stride     Therapeutic activity:  Pt standing SBA at table to complete [facility] task. Pt retrieving thumb tack from box, placing it on paper on vertical platform, pt demonstrating significantly decreased compensatory shoulder movements during shoulder flexion when placing pins in cork board. Pt able to manipulate small push pins with several noted errors, pt required occasional assistance with retrieving pins from crowded box d/t  limited sensation. Pt demonstrating emerging ability  to complete in hand manipulation with small items. Pt standing for ~26 minutes at table top and during functional ambulation with RW with no rest breaks required.    Therapy/Group: Individual Therapy  Velia Meyer, OTD, OTR/L 09/07/2022, 4:52 PM

## 2022-09-08 NOTE — Progress Notes (Signed)
Occupational Therapy Session Note  Patient Details  Name: Kenneth Flores MRN: [id number] Date of Birth: 04-28-1960  Today's Date: 09/08/2022 OT Individual Time: 4098-1191 OT Individual Time Calculation (min): 69 min    Short Term Goals: Week 2:  OT Short Term Goal 1 (Week 2): Pt will initiate complete HEP program with Mod I OT Short Term Goal 2 (Week 2): Pt will stand to complete shower with CGA with AE as necessary OT Short Term Goal 3 (Week 2): Pt will demonstrate improved scapular/shoulder stability in order to increase fine motor coordination while completing self care tasks  Skilled Therapeutic Interventions/Progress Updates:      Therapy Documentation Precautions:  Precautions Precautions: Fall, Cervical Precaution Comments: soft collar when OOB Required Braces or Orthoses: Cervical Brace Cervical Brace: Soft collar Restrictions Weight Bearing Restrictions: No General: "Hi there." Pt seated in W/C upon OT arrival, agreeable to OT. Pt taken outside, nursing notified for safety.    Pain: 0/10 reported/noted this date   ADL: Toilet transfer:distant supervision with RW ambulating ~20 feet from W/C to toilet Toileting: standing distant supervision to void, able to manage pants over hips doffing/donning Transfers: distant supervision with RW, CGA emerging close supervision without device, no LOB/SOB, occasional VC for foot coordination when not using AE Functional mobility: pt able to ambulate community distances light CGA without device with no LOB, occasional VC for foot coordination when not using AE  Balance: Pt completed activities in order to increase independence with balance in order to complete ADLs such as bathing.  Pt standing CGA without device to toss bean bags to varied locations into a bucket for multiple trials. Pt instructed to retrieve bean bags from ground with CGA with no LOB, able to reach down for multiple trials without dizziness. Pt demonstrating safe  techniques.    [facility] activities: Pt completed various FMC exercises with Lt hand in order to increase functional grasp and fine motor coordination/dexterity. Pt completed The following exercises: - palm to finger/finger to palm with blue sponge piece -simple rotation of blue sponge piece   Education:   Pt educated on completing in hand manipulation with blue sponge pieces while in room to increase coordination.    Therapy/Group: Individual Therapy  Velia Meyer, OTD, OTR/L 09/08/2022, 4:44 PM

## 2022-09-08 NOTE — Patient Care Conference (Signed)
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 Genice Rouge Social Worker Present: Cecile Sheerer, LCSWA Nurse Present: Vedia Pereyra, RN PT Present: Bernie Covey, PT OT Present: Valetta Fuller, OT PPS Coordinator present : Fae Pippin, SLP     Current Status/Progress Goal Weekly Team Focus  Bowel/Bladder   Pt continent B/B  LBM 09/06/22   Will maintain normal B.B pattern   Assist pt with toileting qshift/prn    Swallow/Nutrition/ Hydration   Regular textures with thin liquids, Mod I   Mod I  Education complete, goals met, Discharged from skilled SLP intervention    ADL's   SBA all ADLs and functional mobility with RW   upgraded to Mod I overall   decreasing compensatory movements in LUE, increasing fine motor control in Lt hand Barriers: slight impulsivity, safety awareness    Mobility   Supervision gait and transfers, improving balance and knee stability, responded well to manual therapy   mod I transfers, supervision gait, CGA stairs  NMR and gait training for improved community and home mobility    Communication                Safety/Cognition/ Behavioral Observations               Pain   Verbalizes pain of 7/10 to neck incision. Pain being managed with current Prn pain meds   Will verbalize pain  ,3   Assess pt for pain qshift/prn    Skin   Incision to right neck with steri strip in place.   Pt will be free from infection  Assess skin for breakdown, infection and promote healing      Discharge Planning:  Discharge remains to home with his wife. He will need to be intermittent superivison to Mod I at time of discharge as hiss wife works during the day, and the son and his girlfriend are only able to check in throughout the day. Unsure on how often they can check in. Pt wife is prepared to ensure he has the care he needs at discharge. Fam edu scheduled for 6/25 1pm-4pm.  SW will confirm there are no barriers to discharge.   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 Low PVR's and having bowel movements daily without bowel program. Family education  09/15/22. Impulsive. Blood pressure improved. Now wearing soft collar. Increase in left hand fine motor/strength. K-tape and e-stem.  Patient on target to meet rehab goals: yes, some goals upgraded with discharged moved up from 09/19/22 to 09/16/22  *See Care Plan and progress notes for long and short-term goals.   Revisions to Treatment Plan:  Cymbalta added.  Monitor labs/VS Teaching Needs: Medications, safety, self care, skin care, functional gait/transfer training, etc.   Current Barriers to Discharge: Decreased caregiver support and impulsiveness and safety awareness.  Possible Resolutions to Barriers: Family education, order recommended DME if needed.      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- baldder emptying- pain 7/10- all the time- mainly nerve pain- now wearing soft collar- traps knotted up-  Barriers to Discharge: Medical stability;Neurogenic Bowel & Bladder;Uncontrolled Pain;Weight bearing restrictions;Self-care education  Barriers to Discharge Comments: barriers-central cord syndrome; UE weakness-  nerve pain and doing things unsafely- not locking brakes  and myofascial pain- which increases pain; also nerve pain Possible Resolutions to [employer] Focus: family ed 6/25 work on Engineer, building services- will work on myofascial pain;a dded cymbalta for nerve pain; doing estim on hands- also k-taping shoulders- d/c- 6/26- moved up from 6/29   Continued Need for Acute Rehabilitation Level of Care: The patient requires daily medical management by a physician with specialized training in physical medicine and rehabilitation for the following reasons: Direction of a multidisciplinary physical rehabilitation program to maximize functional independence : Yes Medical management of patient stability for increased activity during participation in an intensive rehabilitation regime.: Yes Analysis of laboratory values and/or radiology reports with any subsequent need for medication adjustment and/or medical intervention. : Yes   I attest that I was present, lead the team conference, and concur with the assessment and plan of the team.   Jearld Adjutant 09/08/2022, 3:28 PM

## 2022-09-08 NOTE — Progress Notes (Signed)
PROGRESS NOTE   Subjective/Complaints:  Pt reports he feels "comfortable" standing at sink with w/c behind him- even though I don't think cleared for this in room- advised pt to not do, esp because needed reminder to set brakes to get up/down on w/c.   LBM 3am- large  Went outside with therapy yesterday- going well ROS:   Pt denies SOB, abd pain, CP, N/V/C/D, and vision changes  Except for HPI  Objective:   No results found. Recent Labs    09/07/22 0558  WBC 5.9  HGB 13.8  HCT 38.2*  PLT 163    Recent Labs    09/07/22 0558  NA 136  K 4.0  CL 96*  CO2 28  GLUCOSE 101*  BUN 23*  CREATININE 0.85  CALCIUM 9.1     Intake/Output Summary (Last 24 hours) at 09/08/2022 0914 Last data filed at 09/08/2022 0711 Gross per 24 hour  Intake 597 ml  Output --  Net 597 ml         Physical Exam: Vital Signs Blood pressure 123/85, pulse 69, temperature 97.7 F (36.5 C), temperature source Oral, resp. rate 16, height 6' (1.829 m), weight 73.9 kg, SpO2 97 %.     General: awake, alert, appropriate, sitting in w/c after sat down- initially standing at sink grooming/bathing;  NAD HENT: conjugate gaze; oropharynx moist CV: regular rate and rhythm; no JVD Pulmonary: CTA B/L; no W/R/R- good air movement GI: soft, NT, ND, (+)BS- normoactive Psychiatric: appropriate- interactive Neurological: Ox3  Obviously has difficulty with arms- difficulty putting on shirt- but a little better today Skin: incision Clean and intact without signs of breakdown MS: RUE-  deltoid 4/5; Biceps 4/5; Triceps 4-/5; WE 4/5; Grip 2+/5 and FA 2-/5 LUE- Deltoid 4/5; biceps 4/5; Triceps 3+/5; WE 3+/5; grip 2+/5 and FA 2/5  PRIOR EXAMS: Neuro:  Alert and oriented x 3. Normal insight and awareness. Intact Memory. Normal language and speech. Cranial nerve exam unremarkable. MMT: RUE 4/5 delt, biceps, 4- triceps, 3+WE, 3+/5 HI. LUE 3/5 deltoids,  biceps, triceps and    3- to 3 WE and HI. BLE motor grossly 4/5 HF, KE and 4+ ADF/PF. Decreased LT in both arms. Senses pain and LT in both legs. No abnl resting tone. DTR's absent  Musculoskeletal: still with head forward position, shoulders rotated inward.  Can improve it with cueing. Traps generally tight. No focal TP's appreciated   Assessment/Plan: 1. Functional deficits which require 3+ hours per day of interdisciplinary therapy in a comprehensive inpatient rehab setting. Physiatrist is providing close team supervision and 24 hour management of active medical problems listed below. Physiatrist and rehab team continue to assess barriers to discharge/monitor patient progress toward functional and medical goals  Care Tool:  Bathing    Body parts bathed by patient: Left arm, Chest, Abdomen, Front perineal area, Buttocks, Right upper leg, Left upper leg, Right lower leg, Left lower leg, Face, Right arm   Body parts bathed by helper: Right arm     Bathing assist Assist Level: Supervision/Verbal cueing     Upper Body Dressing/Undressing Upper body dressing   What is the patient wearing?: Pull over shirt    Upper  body assist Assist Level: Supervision/Verbal cueing    Lower Body Dressing/Undressing Lower body dressing      What is the patient wearing?: Pants, Underwear/pull up     Lower body assist Assist for lower body dressing: Supervision/Verbal cueing     Toileting Toileting    Toileting assist Assist for toileting: Supervision/Verbal cueing     Transfers Chair/bed transfer  Transfers assist     Chair/bed transfer assist level: Contact Guard/Touching assist     Locomotion Ambulation   Ambulation assist      Assist level: Contact Guard/Touching assist Assistive device: Walker-rolling Max distance: 200   Walk 10 feet activity   Assist     Assist level: Contact Guard/Touching assist Assistive device: Walker-rolling   Walk 50 feet  activity   Assist    Assist level: Contact Guard/Touching assist Assistive device: Walker-rolling    Walk 150 feet activity   Assist Walk 150 feet activity did not occur: Safety/medical concerns  Assist level: Contact Guard/Touching assist Assistive device: Walker-rolling    Walk 10 feet on uneven surface  activity   Assist Walk 10 feet on uneven surfaces activity did not occur: Safety/medical concerns         Wheelchair     Assist Is the patient using a wheelchair?: Yes Type of Wheelchair: Manual    Wheelchair assist level: Dependent - Patient 0% Max wheelchair distance: >150    Wheelchair 50 feet with 2 turns activity    Assist        Assist Level: Dependent - Patient 0%   Wheelchair 150 feet activity     Assist      Assist Level: Dependent - Patient 0%   Blood pressure 123/85, pulse 69, temperature 97.7 F (36.5 C), temperature source Oral, resp. rate 16, height 6' (1.829 m), weight 73.9 kg, SpO2 97 %.  Medical Problem List and Plan: 1. 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 Patient is status post three-level anterior cervical decompression and fusion 5/30.              -patient may  shower with incision UNCOVERED             -ELOS/Goals: 09/19/22           Con't CIR PT and OT  Team conference today to f/u on progress 2.  Antithrombotics: -DVT/anticoagulation:   Lovenox 40mg  QD               -antiplatelet therapy: none   3. Pain Management: Tylenol, Norco 5 or 10, and Robaxin as needed - Valium 2 mg q6 hours prn for muscle tightness of neck - lidoderm 2 patches 8pm to 8am -6/14  gabapentin 600mg  tid  -add kpad for shoulder girdles  -has menthol cream as well as above meds -discussed importance of improving his head and shoulder/neck posture in the short term and long term -6/15-16/24 pain well controlled, cont regimen, will discuss with weekday team  if gabapentin is helping or if he wants to try increasing dose--objectively seems to be less bothered by the paresthesias, but still reports them being present and bothersome 6/17- will add Cymbalta 30 mg at bedtime for nerve pain- will have pt have lidoderm put on neck and if doesn't work, might benefit from Trp injections.  6/18- no side effects from Cymbalta- should take some time to help nerve pain 4. Mood/Behavior/Sleep: LCSW to evaluate and provide emotional support             -  antipsychotic agents: n/a - melatonin 5mg  QHS; also has trazodone PRN -sleep improved, cont regimen   6/18- sleeping well 5. Neuropsych/cognition: This patient is capable of making decisions on his own behalf.   6. Skin/Wound Care: Routine skin care checks             -monitor surgical incision   7. Fluids/Electrolytes/Nutrition: Routine Is and Os and follow-up chemistries -Na+ holding at 130 6/10, other labs look good    6/17- Na 136 today 8: Constipation due to neurogenic bowel: currently Colace 100 mg BID, Miralax QD, dulcolax 10mg  suppository QD             -had results with sorbitol yesterday -6/12-stopped evening suppository as he's having urge/sensation to empty  -6/14 had bm last evening on his own.  -09/06/22 LBM last night, cont regimen 6/18- going daily with miralax- per pt 9: History of chronic hepatitis C: completed treatment; follows with ID   10. Neurogenic bladder with urinary retention-   -timed voiding attempts every 6 hours. Scan patient after voids and have pt cathed if volumes >350cc-   - up to bsc, toilet, bladder massage and double voiding  -6/10 now voiding continently and with low PVR's (122)  -09/06/22 no ongoing retention  6/18- no issues/resolved 11. Post-op dysphagia---due to cervical surgery. Struggling with solids -can continue D3 diet for now  - cepacol and chloraseptic spray PRN  12. Elevated BPs: -remotely on HCTZ 25mg  QD and Lisinopril 40mg  QD;  -6/9 HCTZ   restarted--added 10mg  lisinopril also 6/10 -6/14 improved control in general, consider increasing lisinopril -6/15-16/24 improving control, cont regimen.  6/17- 6/18BP doing better- con't regimen Vitals:   09/04/22 1501 09/04/22 2017 09/05/22 0308 09/05/22 1304  BP: 131/84 119/80 (!) 129/91 121/79   09/05/22 1933 09/06/22 0610 09/06/22 1307 09/06/22 1906  BP: 111/78 132/72 121/69 114/74   09/07/22 0307 09/07/22 1257 09/07/22 1942 09/08/22 0423  BP: 114/66 113/67 117/77 123/85     13. Tobacco use: no cravings per H&P, encourage cessation on discharge.      I spent a total of 41   minutes on total care today- >50% coordination of care- due to team conference to f/u on progress- and seeing pt standing in room "getting washed up"- asked him to use w/c to do so and advised to lock brakes-    LOS: 12 days A FACE TO FACE EVALUATION WAS PERFORMED  Jakylan Ron 09/08/2022, 9:14 AM

## 2022-09-08 NOTE — Progress Notes (Signed)
Occupational Therapy Session Note  Patient Details  Name: Name Seese MRN: [id number] Date of Birth: 1960-12-31  Today's Date: 09/08/2022 OT Individual Time: 1440-1538 OT Individual Time Calculation (min): 58 min    Short Term Goals: Week 2:  OT Short Term Goal 1 (Week 2): Pt will initiate complete HEP program with Mod I OT Short Term Goal 2 (Week 2): Pt will stand to complete shower with CGA with AE as necessary OT Short Term Goal 3 (Week 2): Pt will demonstrate improved scapular/shoulder stability in order to increase fine motor coordination while completing self care tasks  Skilled Therapeutic Interventions/Progress Updates:  Skilled OT intervention completed with focus on ambulatory transfers, standing tolerance, L hand fine motor coordination and IADL medication management. Pt received seated in w/c, agreeable to session. 7/10 pain reported in bilateral trapezius regions; nurse in gym to provide meds. OT offered rest breaks and repositioning throughout for pain reduction.  Pt declined self-care needs. Completed close supervision sit > stand and ambulatory transfers with [facility] throughout session, without LOB.  Ambulated about 137ft to gym. In stance for standing tolerance, pt was able to use L hand during connect 4 game at elevated table level x3 rounds. First 2 rounds, pt was unable to identify or attend to when OT got 4 in a row unless prompted. On 3rd trial when reminded of goal of the game, pt was able to prevent 4 in a row from occurring.  Transitioned to seated fine motor task incorporating IADL medication management task. Pt was unable to report his current meds while in hospital, but when given 3 containers with complex schedule, pt was able to accurately place all pills into pill box with supervision as he did have 1 question about the med that was 2 times daily at breakfast/dinner. Demonstrated difficulty using L hand to pick up pieces, however with dycem placed underneath pt with  increased ability pincer grasp the pieces and transfer to pill box. Education provided on how dycem can be used for other activities for increased grasping. Issued pt a personal amount to take home.  Ambulated to Nustep then completed 6 mins on level 4 with BLE only for cardiovascular endurance needed for BADL management.   Ambulated back to room, then to commode with pt continent of standing void, with distant supervision provided. Ambulated to w/c, with w/c seat slightly recline for cervical comfort while seated.   Pt remained seated in w/c, with belt alarm on/activated, and with all needs in reach at end of session.   Therapy Documentation Precautions:  Precautions Precautions: Fall, Cervical Precaution Comments: soft collar when OOB Required Braces or Orthoses: Cervical Brace Cervical Brace: Soft collar Restrictions Weight Bearing Restrictions: No    Therapy/Group: Individual Therapy  Melvyn Novas, MS, OTR/L  09/08/2022, 3:47 PM

## 2022-09-08 NOTE — Progress Notes (Signed)
Patient ID: Kenneth Flores, male   DOB: [birth year], 62 y.o.   MRN: [id number]  [id number]- SW spoke with pt wife [name] to provide updates from team conference, and d/c date now changed from 6/29 to 6/26. SW informed received claim form from pt signed by physician and will fax. SW will fax other documents once completed by physician. Fam edu remains on 6/25.  SW made efforts to meet with pt but pt in PT session. SW will follow-up with updates.   SW faxed claim form to [employer] [place] [id number].  Cecile Sheerer, MSW, LCSWA Office: [phone] Cell: [phone] Fax: [phone]

## 2022-09-08 NOTE — Progress Notes (Signed)
Physical Therapy Session Note  Patient Details  Name: Kenneth Flores MRN: [id number] Date of Birth: December 06, 1960  Today's Date: 09/08/2022 PT Individual Time: 1130-1200, 1330-1430 PT Individual Time Calculation (min): 30 min, 60 min   Short Term Goals: Week 2:  PT Short Term Goal 1 (Week 2): Pt will navigate stairs with single handrail PT Short Term Goal 2 (Week 2): Pt will ambulate >100 ft without AD PT Short Term Goal 3 (Week 2): Pt will navigate 6" hurdles no LOB  Skilled Therapeutic Interventions/Progress Updates:    Session 1: Pt seated in w/c on arrival and agreeable to therapy. Pt reports 7/10 pain, requested pain medication at end of session. ambulatory transfer to bathroom with CGA, supervision for 3/3 toileting tasks. Session focused on gait and stair navigation.   Pt ambulated with RW with CGA-supervision throughout session.  Pt performed 3 bouts of stair navigation + gait for endurance and functional mobility. Stair navigation with self selected step to pattern. Pt was able to perform alternating with L HR, but reports step to pattern feels safer for him, demoing good safety awareness. While ambulating without UE support, pt cued for improved arm swing with good carryover. Note improved knee stability from previous sessions and no true kne buckle or LOB noted.   Pt returned to room and remained in w/c, was left with all needs in reach and alarm active.   Session 2: Pt seated in w/c on arrival and agreeable to therapy. No complaint of pain. ambulatory transfer to bathroom, supervision for 3/3 toileting tasks. Pt ambulated to gym with RW and supervision.   Participated in trials with single point cane. Pt able to use safely over level ground and over obstacle course. Pt also reports feeling better/safer with cane over RW Obstacle course: mat with weights underneath for unlevel compliant surface, 6" hurdles, 3" step, 8"step, weaving through cones. At end of session pt ambulated x~15 min  with 1 seated rest break with spc. Supervision-CGA with fatigue. Pt with small LOB x 3 requiring no more than CGA to recover with fatigue. Discussed energy conservation and rest breaks for safe fatigue management.   Pt also participated in floor transfer training. Discussed carrying phone in order to be able to call for help, scanning for injuries, and appropriate activation of EMS if needed. Pt performed floor transfer x 2 with CGA. Pt then performed tall kneeling<>standing with cane and mat table for UE support, x 10 BIL and then x 10 alternating LE for strength and confidence with floor transfer.   Pt returned to room and to w/c, remained with needs in reach to await OT session.   Therapy Documentation Precautions:  Precautions Precautions: Fall, Cervical Precaution Comments: soft collar when OOB Required Braces or Orthoses: Cervical Brace Cervical Brace: Soft collar Restrictions Weight Bearing Restrictions: No General:       Therapy/Group: Individual Therapy  [name] 09/08/2022, 12:28 PM

## 2022-09-09 DIAGNOSIS — G8254 Quadriplegia, C5-C7 incomplete: Secondary | ICD-10-CM | POA: Diagnosis present

## 2022-09-09 NOTE — Progress Notes (Signed)
Occupational Therapy Session Note  Patient Details  Name: Kenneth Flores MRN: [id number] Date of Birth: 1960-07-11  Today's Date: 09/09/2022 OT Individual Time: 4098-1191 OT Individual Time Calculation (min): 42 min    Short Term Goals: Week 2:  OT Short Term Goal 1 (Week 2): Pt will initiate complete HEP program with Mod I OT Short Term Goal 2 (Week 2): Pt will stand to complete shower with CGA with AE as necessary OT Short Term Goal 3 (Week 2): Pt will demonstrate improved scapular/shoulder stability in order to increase fine motor coordination while completing self care tasks  Skilled Therapeutic Interventions/Progress Updates:    OT intervention with focus on amb with/without SPC, standing balance, LUE functional use, and safety awareness to increase independene with BADLs. Pt engaged in therapy ball toss with focus on LUE use, therapy ball toss against mini trampoline, and small weighted ball (1.5#) toss agaist mini trampoline, carrying basket with BUE, and carrying ball with BUE. Amb without AD with CGA. All other amb with supervision. Pt stood at window to remove Squigz with LUE to promote shoulder flexion and grasp/release. Pt returned to room and sat EOB. Bel alarm activated. All needs within reach.   Therapy Documentation Precautions:  Precautions Precautions: Fall, Cervical Precaution Comments: soft collar when OOB Required Braces or Orthoses: Cervical Brace Cervical Brace: Soft collar Restrictions Weight Bearing Restrictions: No  Pain: Pt reports 7/10 pain across upper traps/shoulders; MD aware, repositioned  Therapy/Group: Individual Therapy  [name] 09/09/2022, 7:47 AM

## 2022-09-09 NOTE — Progress Notes (Signed)
Physical Therapy Session Note  Patient Details  Name: Kenneth Flores MRN: [id number] Date of Birth: 14-Mar-1961  Today's Date: 09/09/2022 PT Individual Time: 4098-1191 PT Individual Time Calculation (min): 41 min   Short Term Goals: Week 2:  PT Short Term Goal 1 (Week 2): Pt will navigate stairs with single handrail PT Short Term Goal 2 (Week 2): Pt will ambulate >100 ft without AD PT Short Term Goal 3 (Week 2): Pt will navigate 6" hurdles no LOB  Skilled Therapeutic Interventions/Progress Updates:    Pt presents in room seated EOB, agreeable to PT. Pt reporting persistent pain in neck, unrated but reports premedicated. Session focused on gait training for BUE/BLE coordination, dual tasking, multidirectional stepping stability, and foot clearance.  Pt ambulates to bathroom, stands to complete void, continent and charted, maintains standing without UE support 2 minutes with supervision. Pt placing SPC propped against wall during toileting.  Pt ambulates to main gym with SPC, CGA with occasional postural sway but no LOB. Pt educated on "hurrycane attachment" for SPC to decrease potential of SPC falling over when not using it with pt verbalizing understanding.  Pt completes agility ladder to promote BLE coordination, single limb stance stability, foot clearance, and multidirectional stepping stability incuding: - forward step through gait x4 - backward step to gait x4 - backward step through gait x2 - multidirectional stepping (forward, backward, sidestepping into/out of agility ladder  Pt completes self ball toss with 1# weighted ball as follows to promote BUE/BLE coordination, dynamic standing balance and dual tasking including: - seated x30 seconds - standing x30 seconds - marching x30 seconds - walking 4x25' - sidestepping 2x25' bilaterally - backwards walking 4x20'  Pt reports increased soreness in low back following gait training. Pt instructed in self mobilizing and stretching with  lumbar roll in seated chair with pt demonstrating understanding. Pt ambulates back to room with SPC CGA, one LOB requiring CGA only to recover with fatigue. Pt returns to sitting on EOB and completes bed mobility supervision. Pt remains supine with all needs within reach, call light in place, and bed alarm activated at end of session.  Therapy Documentation Precautions:  Precautions Precautions: Fall, Cervical Precaution Comments: soft collar when OOB Required Braces or Orthoses: Cervical Brace Cervical Brace: Soft collar Restrictions Weight Bearing Restrictions: No   Therapy/Group: Individual Therapy  Edwin Cap PT, DPT 09/09/2022, 12:22 PM

## 2022-09-09 NOTE — Plan of Care (Signed)
  Problem: SCI BOWEL ELIMINATION Goal: RH STG SCI MANAGE BOWEL WITH MEDICATION WITH ASSISTANCE Description: STG SCI Manage bowel with medication with min assistance. Outcome: Progressing; bowel program cancelled per order   Problem: SCI BLADDER ELIMINATION Goal: RH STG MANAGE BLADDER WITH ASSISTANCE Description: STG Manage Bladder With min/mod Assistance Outcome: Progressing; bladder scan  q 6

## 2022-09-09 NOTE — Progress Notes (Signed)
PROGRESS NOTE   Subjective/Complaints:  Pt reports BM yesterday and at 2am.  Voiding well Pain 7/10 and doesn't change because neck pain- patches not helpful.    ROS:   . Pt denies SOB, abd pain, CP, N/V/C/D, and vision changes   Except for HPI  Objective:   No results found. Recent Labs    09/07/22 0558  WBC 5.9  HGB 13.8  HCT 38.2*  PLT 163    Recent Labs    09/07/22 0558  NA 136  K 4.0  CL 96*  CO2 28  GLUCOSE 101*  BUN 23*  CREATININE 0.85  CALCIUM 9.1     Intake/Output Summary (Last 24 hours) at 09/09/2022 1854 Last data filed at 09/09/2022 1700 Gross per 24 hour  Intake 472 ml  Output --  Net 472 ml         Physical Exam: Vital Signs Blood pressure (!) 146/83, pulse 75, temperature 98.1 F (36.7 C), temperature source Oral, resp. rate 18, height 6' (1.829 m), weight 73.9 kg, SpO2 100 %.      General: awake, alert, appropriate, sitting EOB; NAD HENT: conjugate gaze; oropharynx moist CV: regular rate and rhythm; no JVD Pulmonary: CTA B/L; no W/R/R- good air movement GI: soft, NT, ND, (+)BS Psychiatric: appropriate Neurological: Ox3 MS; tight in scalenes, upper traps, splenius capitus and levators and rhomboids B/L Obviously has difficulty with arms- difficulty putting on shirt- but a little better today Skin: incision Clean and intact without signs of breakdown MS: RUE-  deltoid 4/5; Biceps 4/5; Triceps 4-/5; WE 4/5; Grip 2+/5 and FA 2-/5 LUE- Deltoid 4/5; biceps 4/5; Triceps 3+/5; WE 3+/5; grip 2+/5 and FA 2/5  PRIOR EXAMS: Neuro:  Alert and oriented x 3. Normal insight and awareness. Intact Memory. Normal language and speech. Cranial nerve exam unremarkable. MMT: RUE 4/5 delt, biceps, 4- triceps, 3+WE, 3+/5 HI. LUE 3/5 deltoids, biceps, triceps and    3- to 3 WE and HI. BLE motor grossly 4/5 HF, KE and 4+ ADF/PF. Decreased LT in both arms. Senses pain and LT in both legs. No abnl  resting tone. DTR's absent  Musculoskeletal: still with head forward position, shoulders rotated inward.  Can improve it with cueing. Traps generally tight. No focal TP's appreciated   Assessment/Plan: 1. Functional deficits which require 3+ hours per day of interdisciplinary therapy in a comprehensive inpatient rehab setting. Physiatrist is providing close team supervision and 24 hour management of active medical problems listed below. Physiatrist and rehab team continue to assess barriers to discharge/monitor patient progress toward functional and medical goals  Care Tool:  Bathing    Body parts bathed by patient: Left arm, Chest, Abdomen, Front perineal area, Buttocks, Right upper leg, Left upper leg, Right lower leg, Left lower leg, Face, Right arm   Body parts bathed by helper: Right arm     Bathing assist Assist Level: Supervision/Verbal cueing     Upper Body Dressing/Undressing Upper body dressing   What is the patient wearing?: Pull over shirt    Upper body assist Assist Level: Supervision/Verbal cueing    Lower Body Dressing/Undressing Lower body dressing      What is the patient  wearing?: Pants, Underwear/pull up     Lower body assist Assist for lower body dressing: Supervision/Verbal cueing     Toileting Toileting    Toileting assist Assist for toileting: Supervision/Verbal cueing     Transfers Chair/bed transfer  Transfers assist     Chair/bed transfer assist level: Contact Guard/Touching assist     Locomotion Ambulation   Ambulation assist      Assist level: Contact Guard/Touching assist Assistive device: Walker-rolling Max distance: 200   Walk 10 feet activity   Assist     Assist level: Contact Guard/Touching assist Assistive device: Walker-rolling   Walk 50 feet activity   Assist    Assist level: Contact Guard/Touching assist Assistive device: Walker-rolling    Walk 150 feet activity   Assist Walk 150 feet activity  did not occur: Safety/medical concerns  Assist level: Contact Guard/Touching assist Assistive device: Walker-rolling    Walk 10 feet on uneven surface  activity   Assist Walk 10 feet on uneven surfaces activity did not occur: Safety/medical concerns         Wheelchair     Assist Is the patient using a wheelchair?: Yes Type of Wheelchair: Manual    Wheelchair assist level: Dependent - Patient 0% Max wheelchair distance: >150    Wheelchair 50 feet with 2 turns activity    Assist        Assist Level: Dependent - Patient 0%   Wheelchair 150 feet activity     Assist      Assist Level: Dependent - Patient 0%   Blood pressure (!) 146/83, pulse 75, temperature 98.1 F (36.7 C), temperature source Oral, resp. rate 18, height 6' (1.829 m), weight 73.9 kg, SpO2 100 %.  Medical Problem List and Plan: 1. 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 Patient is status post three-level anterior cervical decompression and fusion 5/30.              -patient may  shower with incision UNCOVERED             -ELOS/Goals: 09/19/22           Con't CIR PT and OT  Con't CIR PT and OT- moved d/c date to 6/26 2.  Antithrombotics: -DVT/anticoagulation:   Lovenox 40mg  QD               -antiplatelet therapy: none   3. Pain Management: Tylenol, Norco 5 or 10, and Robaxin as needed - Valium 2 mg q6 hours prn for muscle tightness of neck - lidoderm 2 patches 8pm to 8am -6/14  gabapentin 600mg  tid  -add kpad for shoulder girdles  -has menthol cream as well as above meds -discussed importance of improving his head and shoulder/neck posture in the short term and long term -6/15-16/24 pain well controlled, cont regimen, will discuss with weekday team if gabapentin is helping or if he wants to try increasing dose--objectively seems to be less bothered by the paresthesias, but still reports them being present  and bothersome 6/17- will add Cymbalta 30 mg at bedtime for nerve pain- will have pt have lidoderm put on neck and if doesn't work, might benefit from Trp injections.  6/18- no side effects from Cymbalta- should take some time to help nerve pain 6/19- will do Trigger point injections in AM-educated what they do- no side effects from cymbalta 4. Mood/Behavior/Sleep: LCSW to evaluate and provide emotional support             -  antipsychotic agents: n/a - melatonin 5mg  QHS; also has trazodone PRN -sleep improved, cont regimen   6/18- sleeping well 5. Neuropsych/cognition: This patient is capable of making decisions on his own behalf.   6. Skin/Wound Care: Routine skin care checks             -monitor surgical incision   7. Fluids/Electrolytes/Nutrition: Routine Is and Os and follow-up chemistries -Na+ holding at 130 6/10, other labs look good    6/17- Na 136 today 8: Constipation due to neurogenic bowel: currently Colace 100 mg BID, Miralax QD, dulcolax 10mg  suppository QD             -had results with sorbitol yesterday -6/12-stopped evening suppository as he's having urge/sensation to empty  -6/14 had bm last evening on his own.  -09/06/22 LBM last night, cont regimen 6/18- going daily with miralax- per pt 6/19- 2 Bms yesterday 9: History of chronic hepatitis C: completed treatment; follows with ID   10. Neurogenic bladder with urinary retention-   -timed voiding attempts every 6 hours. Scan patient after voids and have pt cathed if volumes >350cc-   - up to bsc, toilet, bladder massage and double voiding  -6/10 now voiding continently and with low PVR's (122)  -09/06/22 no ongoing retention  6/18- no issues/resolved 11. Post-op dysphagia---due to cervical surgery. Struggling with solids -can continue D3 diet for now  - cepacol and chloraseptic spray PRN  12. Elevated BPs: -remotely on HCTZ 25mg  QD and Lisinopril 40mg  QD;  -6/9 HCTZ  restarted--added 10mg  lisinopril also 6/10 -6/14  improved control in general, consider increasing lisinopril -6/15-16/24 improving control, cont regimen.  6/17- 6/18BP doing better- con't regimen Vitals:   09/05/22 1933 09/06/22 0610 09/06/22 1307 09/06/22 1906  BP: 111/78 132/72 121/69 114/74   09/07/22 0307 09/07/22 1257 09/07/22 1942 09/08/22 0423  BP: 114/66 113/67 117/77 123/85   09/08/22 1319 09/08/22 2008 09/09/22 0315 09/09/22 1257  BP: 119/76 119/76 (!) 149/83 (!) 146/83     13. Tobacco use: no cravings per H&P, encourage cessation on discharge.        LOS: 13 days A FACE TO FACE EVALUATION WAS PERFORMED  Alixis Harmon 09/09/2022, 6:54 PM

## 2022-09-09 NOTE — Progress Notes (Signed)
Occupational Therapy Session Note  Patient Details  Name: Kenneth Flores MRN: [id number] Date of Birth: November 14, 1960  Today's Date: 09/09/2022 OT Individual Time: 6213-0865 & 1446-1530 OT Individual Time Calculation (min): 73 min & 44 min   Short Term Goals: Week 2:  OT Short Term Goal 1 (Week 2): Pt will initiate complete HEP program with Mod I OT Short Term Goal 2 (Week 2): Pt will stand to complete shower with CGA with AE as necessary OT Short Term Goal 3 (Week 2): Pt will demonstrate improved scapular/shoulder stability in order to increase fine motor coordination while completing self care tasks  Skilled Therapeutic Interventions/Progress Updates:      Therapy Documentation Precautions:  Precautions Precautions: Fall, Cervical Precaution Comments: soft collar when OOB Required Braces or Orthoses: Cervical Brace Cervical Brace: Soft collar Restrictions Weight Bearing Restrictions: No  Session 1 General:  "I slept good. Pt supine in bed upon OT arrival, agreeable to OT session.  Pain: 0/10 pain reported/noted   ADL: Bed mobility: distant supervision supine>EOB without use of bed rails Grooming: distant supervision standing with SPC at sink for hand hygiene and applying lotion  Oral hygiene: distant supervision standing with SPC at sink  Toilet transfer: distant supervision ambulating ~20 ft from bed>toilet with SPC  Toileting: distant supervision standing unsupported to void at toilet UB dressing: set-up for overhead shirt in standing LB dressing: set-up for underwear and pants, seated to thread over feet, standing to manage over waist Footwear: SBA seated on EOB to don/doff grip socks, TED hose and shoes Shower transfer: distant supervision with SPC ambulating ~5 ft from toilet>shower Bathing: close supervision standing to bathe all body parts, used grab bars for stabilizing when  Transfers: distant supervision for functional mobility with [facility]     Exercises: Pt completed  10 minutes of nu step, in order to increase BUE/BLEstrength and endurance in preparation for increased independence in ADLs such as bathing. Rest break after 6 min, on level 7 resistance. Pt reported level 13 (somewhat hard) on Borg scale.  Pt completed the following exercise circuit in order to improve functional activity, strength and endurance to prepare for ADLs such as bathing. Pt completed the following exercises in seated/standing position with no noted LOB/SOB and 10-20 repetitions on each exercise: -20x bicep curls with 5# dowel rod -triceps extensions with 4# dumbbell, hand over hand assistance provided on Lt side d/t weak grip strength -shoulder flexion with 4.4# ball -diagonal shoulder raises with trunk rotation with 4.4# ball -20x modified sit ups with 4.4# ball -10x step ups on uneven surface (CGA without device) pt demonstrated difficulty holding position on uneven surface    Pt seated in recliner at end of session with W/C alarm donned, call light within reach and 4Ps assessed.   Session 2 General:  "Hi again". Pt seated in recliner upon OT arrival, agreeable to OT session.  Pain: 0/10 pain reported/noted   ADL: Toilet transfer: SBA with SPA ambulating ~20 ft recliner>toilet Toileting: distant supervision for standing to void urine Functional mobility: Pt ambulated room>< therapy gym supervision with SPC and no LOB/SOB Bed mobility: distant supervision supine>EOB Footwear: doffing TEDs, socks, shoes distant supervision seated EOB Functional mobility: Pt ambulating consistently community distances with SPC no LOB, occasional VC for posture and stride length  9 hole peg test: Lt hand: 66 sec Rt hand: 28 sec Results showing significant improvement from results on 6/14 (see summary for update)  Exercises: [facility]: Pt demonstrated in hand manipulation skills by completing the following  activities seated in chair. Pt completed the exercises in order to increase fine motor  coordination and dexterity in BUE -screwing nuts and bolts on vertical screws -finger to palm/palm to finger with large checker in Lt hand to provide just right challenge, graded down from bolt d/t increased difficulty -finger to palm/palm to finger with small bolt in Rt hand to provide just right challenge  Pt supine in bed with bed alarm activated, 2 bed rails up, call light within reach and 4Ps assessed.   Therapy/Group: Individual Therapy  Velia Meyer, OTD, OTR/L 09/09/2022, 4:13 PM

## 2022-09-09 NOTE — Evaluation (Addendum)
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 Kenneth Flores)     Past Medical History:      Past Medical History:  Diagnosis Date   Hepatitis C      Past Surgical History:       Past Surgical History:  Procedure Laterality Date   ANTERIOR CERVICAL DECOMP/DISCECTOMY FUSION N/A 08/21/2022    Procedure: ANTERIOR CERVICAL DECOMPRESSION/DISCECTOMY FUSION CERVICAL THREE-FOUR, CERVICAL FOUR-FIVE, CERVICAL FIVE-SIX;  Surgeon: Kenneth Sicks, MD;  Location: [facility] OR;  Service: Neurosurgery;  Laterality: N/A;   PELVIC FRACTURE SURGERY Right        Assessment & Plan Clinical Impression: Patient is a 62 year old R handed male with hx of chronic Hep C, status post micturition related syncope from vasovagal episode at home on 08/19/2022 resulting in fall with blow to head. Imaging revealed evidence of significant multilevel cervical degenerative disease with associated disc space collapse and associated spondylosis. Patient with critical spinal stenosis with severe cord compression and cord signal change at C3-4. Severe stenosis at C4-5 and C5-6 without marked cord signal abnormality at these levels. There is some changes posteriorly from C3-C6 with some worrisome findings for some posterior ligamentous injury. There is no evidence of fracture on his CT scan. There is no evidence of malalignment. Neurosurgery consulted and admitted the patient for IV steroids and pain control. On 5/30, he was taken to the OR and underwent C3-4, C4-5, C5-6 anterior cervical discectomy with interbody fusion utilizing interbody cages, local harvested autograft, and anterior plate instrumentation by Dr. Jordan Flores. Motor function improved. Incision healing without signs of infection. Labs stable. Past medical  history is significant for chronic hepatitis C; genotype 1a and is s/p Vosevi time 12 weeks. Undetectable viral RNA 01/21/2022. 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    He is complaining of ongoing left arm numbness and discomfort. Had small hard stool yesterday but that's it since admission- no other BM's. Using purewick/condom cath- can feel when needs to go, however cannot control it when goes-when has to go, needs to go immediately;  not sure if empties or not. Prior to admission, had slowed stream, likely had prostate issues. Urine dark- admits might not be drinking enough.    Having burning pain in LUE mainly- especially L hand- gabapentin isn't working enough. Having a lot of neck pain- feels like massive muscle spasms and feels really tight. Doesn't think Robaxin is enough-  A pain medicine he received is making him really sleepy- when done, wanted to take a nap. Patient transferred to CIR on 08/27/2022 .     Pt presents with decreased activity tolerance, decreased functional mobility, decreased balance Limiting pt's independence with leisure/community pursuits.  Met with pt today to discuss TR services including leisure education, activity analysis/modifications and stress management.  Also discussed the importance of social, emotional, spiritual health in addition to physical health and their effects on overall health and wellness.  Pt stated understanding.   Plan  Min 1 TR session >20 minutes during LOS  Recommendations for other services: None   Discharge Criteria: Patient will be discharged from TR if patient refuses treatment 3 consecutive times without medical reason.  If treatment goals not met, if there is a  change in medical status, if patient makes no progress towards goals or if patient is discharged from [facility].  The above assessment, treatment plan, treatment  alternatives and goals were discussed and mutually agreed upon: by patient  Kenneth Flores 09/09/2022, 3:11 PM

## 2022-09-10 MED ORDER — LIDOCAINE HCL 1 % IJ SOLN
6.0000 mL | Freq: Once | INTRAMUSCULAR | Status: DC
  Filled 2022-09-10: qty 6

## 2022-09-10 MED ORDER — LIDOCAINE HCL (PF) 1 % IJ SOLN
6.0000 mL | Freq: Once | INTRAMUSCULAR | Status: AC
  Administered 2022-09-10: 6 mL
  Filled 2022-09-10: qty 30

## 2022-09-10 NOTE — Progress Notes (Signed)
PROGRESS NOTE   Subjective/Complaints:  Pt reports muscles so tight in neck and shoulders, really painful- hard to tolerate.   BM's daily ROS:    Pt denies SOB, abd pain, CP, N/V/C/D, and vision changes  Except for HPI  Objective:   No results found. No results for input(s): "WBC", "HGB", "HCT", "PLT" in the last 72 hours.   No results for input(s): "NA", "K", "CL", "CO2", "GLUCOSE", "BUN", "CREATININE", "CALCIUM" in the last 72 hours.    Intake/Output Summary (Last 24 hours) at 09/10/2022 0848 Last data filed at 09/10/2022 0736 Gross per 24 hour  Intake 708 ml  Output --  Net 708 ml         Physical Exam: Vital Signs Blood pressure 112/80, pulse 67, temperature 97.7 F (36.5 C), resp. rate 17, height 6' (1.829 m), weight 73.9 kg, SpO2 100 %.       General: awake, alert, appropriate, sitting EOB; already got self dressed; NAD HENT: conjugate gaze; oropharynx moist CV: regular rate and rhythm; no JVD Pulmonary: CTA B/L; no W/R/R- good air movement GI: soft, NT, ND, (+)BS- normoactive Psychiatric: appropriate Neurological: Ox3  MS; tight in scalenes, upper traps, splenius capitus and levators and rhomboids B/L- no change Obviously has difficulty with arms- difficulty putting on shirt- but a little better today Skin: incision Clean and intact without signs of breakdown MS: RUE-  deltoid 4/5; Biceps 4/5; Triceps 4-/5; WE 4/5; Grip 2+/5 and FA 2-/5 LUE- Deltoid 4/5; biceps 4/5; Triceps 3+/5; WE 3+/5; grip 2+/5 and FA 2/5  PRIOR EXAMS: Neuro:  Alert and oriented x 3. Normal insight and awareness. Intact Memory. Normal language and speech. Cranial nerve exam unremarkable. MMT: RUE 4/5 delt, biceps, 4- triceps, 3+WE, 3+/5 HI. LUE 3/5 deltoids, biceps, triceps and    3- to 3 WE and HI. BLE motor grossly 4/5 HF, KE and 4+ ADF/PF. Decreased LT in both arms. Senses pain and LT in both legs. No abnl resting tone.  DTR's absent  Musculoskeletal: still with head forward position, shoulders rotated inward.  Can improve it with cueing. Traps generally tight. No focal TP's appreciated   Assessment/Plan: 1. Functional deficits which require 3+ hours per day of interdisciplinary therapy in a comprehensive inpatient rehab setting. Physiatrist is providing close team supervision and 24 hour management of active medical problems listed below. Physiatrist and rehab team continue to assess barriers to discharge/monitor patient progress toward functional and medical goals  Care Tool:  Bathing    Body parts bathed by patient: Left arm, Chest, Abdomen, Front perineal area, Buttocks, Right upper leg, Left upper leg, Right lower leg, Left lower leg, Face, Right arm   Body parts bathed by helper: Right arm     Bathing assist Assist Level: Supervision/Verbal cueing     Upper Body Dressing/Undressing Upper body dressing   What is the patient wearing?: Pull over shirt    Upper body assist Assist Level: Supervision/Verbal cueing    Lower Body Dressing/Undressing Lower body dressing      What is the patient wearing?: Pants, Underwear/pull up     Lower body assist Assist for lower body dressing: Supervision/Verbal cueing     Toileting Toileting  Toileting assist Assist for toileting: Supervision/Verbal cueing     Transfers Chair/bed transfer  Transfers assist     Chair/bed transfer assist level: Contact Guard/Touching assist     Locomotion Ambulation   Ambulation assist      Assist level: Contact Guard/Touching assist Assistive device: Walker-rolling Max distance: 200   Walk 10 feet activity   Assist     Assist level: Contact Guard/Touching assist Assistive device: Walker-rolling   Walk 50 feet activity   Assist    Assist level: Contact Guard/Touching assist Assistive device: Walker-rolling    Walk 150 feet activity   Assist Walk 150 feet activity did not occur:  Safety/medical concerns  Assist level: Contact Guard/Touching assist Assistive device: Walker-rolling    Walk 10 feet on uneven surface  activity   Assist Walk 10 feet on uneven surfaces activity did not occur: Safety/medical concerns         Wheelchair     Assist Is the patient using a wheelchair?: Yes Type of Wheelchair: Manual    Wheelchair assist level: Dependent - Patient 0% Max wheelchair distance: >150    Wheelchair 50 feet with 2 turns activity    Assist        Assist Level: Dependent - Patient 0%   Wheelchair 150 feet activity     Assist      Assist Level: Dependent - Patient 0%   Blood pressure 112/80, pulse 67, temperature 97.7 F (36.5 C), resp. rate 17, height 6' (1.829 m), weight 73.9 kg, SpO2 100 %.  Medical Problem List and Plan: 1. 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 Patient is status post three-level anterior cervical decompression and fusion 5/30.              -patient may  shower with incision UNCOVERED             -ELOS/Goals: 09/19/22        d/c 6/26 Con't CIR PT and OT 2.  Antithrombotics: -DVT/anticoagulation:   Lovenox 40mg  QD   6/20- walking well- will d/w PT how far walking to determine if needs Lovenox at d/c. I think yes, for 1 extra month, since is central cord syndrome             -antiplatelet therapy: none   3. Pain Management: Tylenol, Norco 5 or 10, and Robaxin as needed - Valium 2 mg q6 hours prn for muscle tightness of neck - lidoderm 2 patches 8pm to 8am -6/14  gabapentin 600mg  tid  -add kpad for shoulder girdles  -has menthol cream as well as above meds -discussed importance of improving his head and shoulder/neck posture in the short term and long term -6/15-16/24 pain well controlled, cont regimen, will discuss with weekday team if gabapentin is helping or if he wants to try increasing dose--objectively seems to be less  bothered by the paresthesias, but still reports them being present and bothersome 6/17- will add Cymbalta 30 mg at bedtime for nerve pain- will have pt have lidoderm put on neck and if doesn't work, might benefit from Trp injections.  6/18- no side effects from Cymbalta- should take some time to help nerve pain 6/19- will do Trigger point injections in AM-educated what they do- no side effects from cymbalta 6/20- Trigger point injections today 4. Mood/Behavior/Sleep: LCSW to evaluate and provide emotional support             -antipsychotic agents: n/a - melatonin 5mg   QHS; also has trazodone PRN -sleep improved, cont regimen   6/18- sleeping well 5. Neuropsych/cognition: This patient is capable of making decisions on his own behalf.   6. Skin/Wound Care: Routine skin care checks             -monitor surgical incision   7. Fluids/Electrolytes/Nutrition: Routine Is and Os and follow-up chemistries -Na+ holding at 130 6/10, other labs look good    6/17- Na 136 today 8: Constipation due to neurogenic bowel: currently Colace 100 mg BID, Miralax QD, dulcolax 10mg  suppository QD             -had results with sorbitol yesterday -6/12-stopped evening suppository as he's having urge/sensation to empty  -6/14 had bm last evening on his own.  -09/06/22 LBM last night, cont regimen 6/18- going daily with miralax- per pt 6/20- going daily with miralax 9: History of chronic hepatitis C: completed treatment; follows with ID   10. Neurogenic bladder with urinary retention-   -timed voiding attempts every 6 hours. Scan patient after voids and have pt cathed if volumes >350cc-   - up to bsc, toilet, bladder massage and double voiding  -6/10 now voiding continently and with low PVR's (122)  -09/06/22 no ongoing retention  6/18- no issues/resolved 11. Post-op dysphagia---due to cervical surgery. Struggling with solids -can continue D3 diet for now  - cepacol and chloraseptic spray PRN  12. Elevated  BPs: -remotely on HCTZ 25mg  QD and Lisinopril 40mg  QD;  -6/9 HCTZ  restarted--added 10mg  lisinopril also 6/10 -6/14 improved control in general, consider increasing lisinopril -6/15-16/24 improving control, cont regimen.  6/20- BP slightly elevated x2 yesterday -in 140's- now better this AM Vitals:   09/06/22 1307 09/06/22 1906 09/07/22 0307 09/07/22 1257  BP: 121/69 114/74 114/66 113/67   09/07/22 1942 09/08/22 0423 09/08/22 1319 09/08/22 2008  BP: 117/77 123/85 119/76 119/76   09/09/22 0315 09/09/22 1257 09/09/22 1941 09/10/22 0529  BP: (!) 149/83 (!) 146/83 129/77 112/80     13. Tobacco use: no cravings per H&P, encourage cessation on discharge.    6/20-  Patient here for trigger point injections for  Consent done and on chart.  Cleaned areas with alcohol and injected using a 27 gauge 1.5 inch needle  Injected 3cc- none wasted Using 1% Lidocaine with no EPI  Upper traps- B/L  Levators- b/L  Posterior scalenes Middle scalenes- B/L  Splenius Capitus Pectoralis Major Rhomboids- b/L x2 Infraspinatus Teres Major/minor Thoracic paraspinals Lumbar paraspinals Other injections-   Current level of pain after injections is improved somewhat after injections  There was no bleeding or complications.  Patient was advised to drink a lot of water on day after injections to flush system Will have increased soreness for 12-48 hours after injections.  Can use Lidocaine patches the day AFTER injections Can use theracane on day of injections in places didn't inject Can use heating pad 4-6 hours AFTER injections    I spent a total of  55  minutes on total care today- >50% coordination of care- due to seeing pt twice and doing trigger point injections for myofascial pain       LOS: 14 days A FACE TO FACE EVALUATION WAS PERFORMED  Phillp Dolores 09/10/2022, 8:48 AM

## 2022-09-10 NOTE — Progress Notes (Signed)
Occupational Therapy Session Note  Patient Details  Name: Kenneth Flores MRN: [id number] Date of Birth: 08-13-1960  Today's Date: 09/10/2022 OT Individual Time: 7829-5621 & OT Individual Time Calculation (min): 60 min &   Short Term Goals: Week 2:  OT Short Term Goal 1 (Week 2): Pt will initiate complete HEP program with Mod I OT Short Term Goal 2 (Week 2): Pt will stand to complete shower with CGA with AE as necessary OT Short Term Goal 3 (Week 2): Pt will demonstrate improved scapular/shoulder stability in order to increase fine motor coordination while completing self care tasks  Skilled Therapeutic Interventions/Progress Updates:      Therapy Documentation Precautions:  Precautions Precautions: Fall, Cervical Precaution Comments: soft collar when OOB Required Braces or Orthoses: Cervical Brace Cervical Brace: Soft collar Restrictions Weight Bearing Restrictions: No  Session 1 General:  "Hi." Pt seated in recliner upon OT arrival, agreeable to OT session. Dr Berline Chough administered lidocane during session. Pt and OT outside for session for increase quality of life.   Pain: 0/10 pain reported/noted  ADL: Pt completed community mobility with SPC with no LOB/SOB. Pt able to manipulate elevator buttons.   Therapeutic activity: Pt completed multiple in hand manipulation activities in seated position with Lt and Rt hand, including finger to palm/palm to finger with sponge pieces, making tight fist and releasing fingers to end range 20X, simple rotation with sponge pieces, pt attempted translation with Lt hand, unable to complete 2/2 thumb ROM limitations, able to complete with Rt hand for multiple trials. PRN rest breaks during activities. Pt completed activities in order to increase in hand manipulation/coordination/dexterity in order to manage zippers and buttons for clothing.  Education: OT educated/discussed purpose of family education.     Pt seated in recliner at end of session  with W/C alarm donned, call light within reach and 4Ps assessed.    Session 2 General: "Hello" Pt seated in recliner upon OT arrival, agreeable to OT.   Pain: 0/10 pain, reporting decreased stiffness in shoulders  ADL: Toilet transfer: distant supervision with SPC ambulating ~20 feet to bathroom Toileting: distant supervision to stand and void Grooming: distant supervision for hand hygiene Functional mobility: pt ambulating community distances with [facility] distant supervision with no LOB/SOB   Therapeutic activities: -pt seated in standard chair participating in game reaching out of BOS retrieving checker pieces with pinch on vertical checker board and placing them in different location, required VB and tactile cue for decreased compensatory movement when completing shoulder flexion -pt standing at wall with no device at supervision while completing towel slides for multiple trials onto wall, VC to relax shoulder when reaching over head and tuck elbow to minimize compensatory movements -pt seated completing multiple putty exercises with pink theraputty in order to strengthen inner hand muscles to complete ADLs such as buttoning pants. Pt completed putty drags, rolling into log, breaking apart log and rolling into ball with Lt hand.   PRN rest breaks during activities.    Other Treatments:  Pt issued pink theraputty d/t increased strength in BUE/hands.  Pt supine in bed with bed alarm activated, 2 bed rails up, call light within reach and 4Ps assessed.   Therapy/Group: Individual Therapy  Velia Meyer, OTD, OTR/L 09/10/2022, 4:23 PM

## 2022-09-10 NOTE — Progress Notes (Signed)
Physical Therapy Session Note  Patient Details  Name: Kenneth Flores MRN: [id number] Date of Birth: April 04, 1960  Today's Date: 09/10/2022 PT Individual Time: 0800-0915 PT Individual Time Calculation (min): 75 min   Short Term Goals: Week 2:  PT Short Term Goal 1 (Week 2): Pt will navigate stairs with single handrail PT Short Term Goal 2 (Week 2): Pt will ambulate >100 ft without AD PT Short Term Goal 3 (Week 2): Pt will navigate 6" hurdles no LOB  Skilled Therapeutic Interventions/Progress Updates:    pt received in bed and agreeable to therapy. Pt reports up to 7/10 neck/shoulder pain, denies intervention at this time. Session focused on gait and balance challenge to increase ankle strategies and dynamic balance. Pt stood with heels on compliant wedge and tossed ball for dynamic balance challenge, CGA-min A. Pt then performed obstacle course walking up/down red compliant wedge to curb step, rocker board (various tasks), and squats on bosu x 5, ambulation without AD x ~50 ft, x 5 bouts without seated rest break. MinA fading to CGA. Cues for normalized UE swing during gait. Transitioned to quadruped for anterior/posterior weight shifts for UE strength and tone management, 3 x 8 with x 5 push ups on last bout. Cues for shoulder protraction and min-mod A L elbow support. Pt then 3 x 412 ft ambulation with tidal tank for dynamic balance and gait challenge, CGA fading to close supervision. Demoes large improvement from previous attempts with significantly less ataxia, even with fatigue. Pt returned to room and to w/c with supervision, was left with all needs in reach and alarm active.   Therapy Documentation Precautions:  Precautions Precautions: Fall, Cervical Precaution Comments: soft collar when OOB Required Braces or Orthoses: Cervical Brace Cervical Brace: Soft collar Restrictions Weight Bearing Restrictions: No General:       Therapy/Group: Individual Therapy  [name] 09/10/2022, 3:57 PM

## 2022-09-11 MED ORDER — DULOXETINE HCL 30 MG PO CPEP
60.0000 mg | ORAL_CAPSULE | Freq: Every day | ORAL | Status: DC
  Administered 2022-09-11 – 2022-09-15 (×5): 60 mg via ORAL
  Filled 2022-09-11 (×5): qty 2

## 2022-09-11 NOTE — Progress Notes (Signed)
Patient ID: Kenneth Flores, male   DOB: 10-Feb-1961, 62 y.o.   MRN: [id number]  SW faxed disability claims form to [place] [employer] [place] ([phone]/[phone]).   *SW left forms in room.   Cecile Sheerer, MSW, LCSWA Office: [phone] Cell: [phone] Fax: [phone]

## 2022-09-11 NOTE — Progress Notes (Signed)
PROGRESS NOTE   Subjective/Complaints:  LBM x2 yesterday TrP injections yesterday was not clear if helpful- still feels like pincushion- when feels muscles in neck, feel looser, but doesn't feel less pain so far.     ROS:     Pt denies SOB, abd pain, CP, N/V/C/D, and vision changes  Except for HPI  Objective:   No results found. No results for input(s): "WBC", "HGB", "HCT", "PLT" in the last 72 hours.   No results for input(s): "NA", "K", "CL", "CO2", "GLUCOSE", "BUN", "CREATININE", "CALCIUM" in the last 72 hours.    Intake/Output Summary (Last 24 hours) at 09/11/2022 0818 Last data filed at 09/11/2022 7829 Gross per 24 hour  Intake 531 ml  Output 400 ml  Net 131 ml         Physical Exam: Vital Signs Blood pressure 126/89, pulse 61, temperature 98 F (36.7 C), temperature source Oral, resp. rate 17, height 6' (1.829 m), weight 73.9 kg, SpO2 100 %.       General: awake, alert, appropriate, sitting EOB eating breakfast; not dressed yet; NAD HENT: conjugate gaze; oropharynx moist CV: regular rate and rhythm; no JVD Pulmonary: CTA B/L; no W/R/R- good air movement GI: soft, NT, ND, (+)BS- normoactive Psychiatric: appropriate Neurological: Ox3  MS; tight in scalenes, upper traps, splenius capitus and levators and rhomboids B/L- somewhat reduced tightness, but still TTP  Skin: incision Clean and intact without signs of breakdown MS: RUE-  deltoid 4/5; Biceps 4/5; Triceps 4-/5; WE 4/5; Grip 2+/5 and FA 2-/5 LUE- Deltoid 4/5; biceps 4/5; Triceps 3+/5; WE 3+/5; grip 2+/5 and FA 2/5  PRIOR EXAMS: Neuro:  Alert and oriented x 3. Normal insight and awareness. Intact Memory. Normal language and speech. Cranial nerve exam unremarkable. MMT: RUE 4/5 delt, biceps, 4- triceps, 3+WE, 3+/5 HI. LUE 3/5 deltoids, biceps, triceps and    3- to 3 WE and HI. BLE motor grossly 4/5 HF, KE and 4+ ADF/PF. Decreased LT in both  arms. Senses pain and LT in both legs. No abnl resting tone. DTR's absent  Musculoskeletal: still with head forward position, shoulders rotated inward.  Can improve it with cueing. Traps generally tight. No focal TP's appreciated   Assessment/Plan: 1. Functional deficits which require 3+ hours per day of interdisciplinary therapy in a comprehensive inpatient rehab setting. Physiatrist is providing close team supervision and 24 hour management of active medical problems listed below. Physiatrist and rehab team continue to assess barriers to discharge/monitor patient progress toward functional and medical goals  Care Tool:  Bathing    Body parts bathed by patient: Left arm, Chest, Abdomen, Front perineal area, Buttocks, Right upper leg, Left upper leg, Right lower leg, Left lower leg, Face, Right arm   Body parts bathed by helper: Right arm     Bathing assist Assist Level: Supervision/Verbal cueing     Upper Body Dressing/Undressing Upper body dressing   What is the patient wearing?: Pull over shirt    Upper body assist Assist Level: Supervision/Verbal cueing    Lower Body Dressing/Undressing Lower body dressing      What is the patient wearing?: Pants, Underwear/pull up     Lower body assist Assist  for lower body dressing: Supervision/Verbal cueing     Toileting Toileting    Toileting assist Assist for toileting: Supervision/Verbal cueing     Transfers Chair/bed transfer  Transfers assist     Chair/bed transfer assist level: Contact Guard/Touching assist     Locomotion Ambulation   Ambulation assist      Assist level: Contact Guard/Touching assist Assistive device: Walker-rolling Max distance: 200   Walk 10 feet activity   Assist     Assist level: Contact Guard/Touching assist Assistive device: Walker-rolling   Walk 50 feet activity   Assist    Assist level: Contact Guard/Touching assist Assistive device: Walker-rolling    Walk 150 feet  activity   Assist Walk 150 feet activity did not occur: Safety/medical concerns  Assist level: Contact Guard/Touching assist Assistive device: Walker-rolling    Walk 10 feet on uneven surface  activity   Assist Walk 10 feet on uneven surfaces activity did not occur: Safety/medical concerns         Wheelchair     Assist Is the patient using a wheelchair?: Yes Type of Wheelchair: Manual    Wheelchair assist level: Dependent - Patient 0% Max wheelchair distance: >150    Wheelchair 50 feet with 2 turns activity    Assist        Assist Level: Dependent - Patient 0%   Wheelchair 150 feet activity     Assist      Assist Level: Dependent - Patient 0%   Blood pressure 126/89, pulse 61, temperature 98 F (36.7 C), temperature source Oral, resp. rate 17, height 6' (1.829 m), weight 73.9 kg, SpO2 100 %.  Medical Problem List and Plan: 1. 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 Patient is status post three-level anterior cervical decompression and fusion 5/30.              -patient may  shower with incision UNCOVERED             -ELOS/Goals: 09/19/22        d/c 6/26 Con't CIR PT and OT- making good gains 2.  Antithrombotics: -DVT/anticoagulation:   Lovenox 40mg  QD   6/20- walking well- will d/w PT how far walking to determine if needs Lovenox at d/c. I think yes, for 1 extra month, since is central cord syndrome             -antiplatelet therapy: none   3. Pain Management: Tylenol, Norco 5 or 10, and Robaxin as needed - Valium 2 mg q6 hours prn for muscle tightness of neck - lidoderm 2 patches 8pm to 8am -6/14  gabapentin 600mg  tid  -add kpad for shoulder girdles  -has menthol cream as well as above meds -discussed importance of improving his head and shoulder/neck posture in the short term and long term -6/15-16/24 pain well controlled, cont regimen, will discuss with  weekday team if gabapentin is helping or if he wants to try increasing dose--objectively seems to be less bothered by the paresthesias, but still reports them being present and bothersome 6/17- will add Cymbalta 30 mg at bedtime for nerve pain- will have pt have lidoderm put on neck and if doesn't work, might benefit from Trp injections.  6/18- no side effects from Cymbalta- should take some time to help nerve pain 6/19- will do Trigger point injections in AM-educated what they do- no side effects from cymbalta 6/20- Trigger point injections today 6/21- hasn't felt much improvement from  TrP injections so far- even though muscles feel looser- will increase Duloxetine to 60 mg at bedtime- and see if that helps.  4. Mood/Behavior/Sleep: LCSW to evaluate and provide emotional support             -antipsychotic agents: n/a - melatonin 5mg  QHS; also has trazodone PRN -sleep improved, cont regimen   6/18- sleeping well 5. Neuropsych/cognition: This patient is capable of making decisions on his own behalf.   6. Skin/Wound Care: Routine skin care checks             -monitor surgical incision   7. Fluids/Electrolytes/Nutrition: Routine Is and Os and follow-up chemistries -Na+ holding at 130 6/10, other labs look good    6/17- Na 136 today 8: Constipation due to neurogenic bowel: currently Colace 100 mg BID, Miralax QD, dulcolax 10mg  suppository QD             -had results with sorbitol yesterday -6/12-stopped evening suppository as he's having urge/sensation to empty  -6/14 had bm last evening on his own.  -6/21-  had 2 BM's yesterday- doing well 9: History of chronic hepatitis C: completed treatment; follows with ID   10. Neurogenic bladder with urinary retention-   -timed voiding attempts every 6 hours. Scan patient after voids and have pt cathed if volumes >350cc-   - up to bsc, toilet, bladder massage and double voiding  -6/10 now voiding continently and with low PVR's (122)  -09/06/22 no  ongoing retention  6/18- no issues/resolved 11. Post-op dysphagia---due to cervical surgery. Struggling with solids -can continue D3 diet for now  - cepacol and chloraseptic spray PRN  12. Elevated BPs: -remotely on HCTZ 25mg  QD and Lisinopril 40mg  QD;  -6/9 HCTZ  restarted--added 10mg  lisinopril also 6/10 -6/14 improved control in general, consider increasing lisinopril -6/15-16/24 improving control, cont regimen.  6/21- BP doing better- con't to monitor and con't regimen Vitals:   09/07/22 1257 09/07/22 1942 09/08/22 0423 09/08/22 1319  BP: 113/67 117/77 123/85 119/76   09/08/22 2008 09/09/22 0315 09/09/22 1257 09/09/22 1941  BP: 119/76 (!) 149/83 (!) 146/83 129/77   09/10/22 0529 09/10/22 1317 09/10/22 2000 09/11/22 0609  BP: 112/80 111/73 127/84 126/89     13. Tobacco use: no cravings per H&P, encourage cessation on discharge.    6/20-  Patient here for trigger point injections for  Consent done and on chart.  Cleaned areas with alcohol and injected using a 27 gauge 1.5 inch needle  Injected 3cc- none wasted Using 1% Lidocaine with no EPI  Upper traps- B/L  Levators- b/L  Posterior scalenes Middle scalenes- B/L  Splenius Capitus Pectoralis Major Rhomboids- b/L x2 Infraspinatus Teres Major/minor Thoracic paraspinals Lumbar paraspinals Other injections-   Current level of pain after injections is improved somewhat after injections  There was no bleeding or complications.  Patient was advised to drink a lot of water on day after injections to flush system Will have increased soreness for 12-48 hours after injections.  Can use Lidocaine patches the day AFTER injections Can use theracane on day of injections in places didn't inject Can use heating pad 4-6 hours AFTER injections    I spent a total of 35   minutes on total care today- >50% coordination of care- due to  D/w pt about options for neck and shoulder pain- decided to increase nerve pain meds- wait  on TrP injections until see if they were actually effective.       LOS: 15 days A  FACE TO FACE EVALUATION WAS PERFORMED  Allin Frix 09/11/2022, 8:18 AM

## 2022-09-11 NOTE — Progress Notes (Signed)
Occupational Therapy Session Note  Patient Details  Name: Kenneth Flores MRN: [id number] Date of Birth: 12-Dec-1960  Today's Date: 09/11/2022 OT Individual Time: 4098-1191 OT Individual Time Calculation (min): 72 min    Short Term Goals: Week 2:  OT Short Term Goal 1 (Week 2): Pt will initiate complete HEP program with Mod I OT Short Term Goal 2 (Week 2): Pt will stand to complete shower with CGA with AE as necessary OT Short Term Goal 3 (Week 2): Pt will demonstrate improved scapular/shoulder stability in order to increase fine motor coordination while completing self care tasks  Skilled Therapeutic Interventions/Progress Updates:    OT intervention with focus on functional amb with SPC, standing balance, LUE function, and safety awareness to increase independence with BADLs. All amb at supervision throughout session. Standing tasks included boxing with no LOB noted. LUE tasks with theraputty, removing and replacing beads, placing nuts on bolts, folding linens. Pt returned to room and sat in relciner. Belt alarm activated. All needs wihtin reach.   Therapy Documentation Precautions:  Precautions Precautions: Fall, Cervical Precaution Comments: soft collar when OOB Required Braces or Orthoses: Cervical Brace Cervical Brace: Soft collar Restrictions Weight Bearing Restrictions: No Pain: Pt c/o 7/10 upper traps pain; MD aware Therapy/Group: Individual Therapy  [name] 09/11/2022, 9:32 AM

## 2022-09-11 NOTE — Progress Notes (Signed)
Occupational Therapy Weekly Progress Note  Patient Details  Name: Kenneth Flores MRN: [id number] Date of Birth: [birth year]  Beginning of progress report period: September 03, 2022 End of progress report period: September 11, 2022  Today's Date: 09/11/2022 OT Individual Time: 7829-5621 OT Individual Time Calculation (min): 41 min    Patient has met 3 of 3 short term goals.    Patient continues to demonstrate the following deficits: muscle weakness and decreased functional use of LUE  and therefore will continue to benefit from skilled OT intervention to enhance overall performance with BADL, iADL, and Reduce care partner burden. Pt has significantly increased LUE function with decreased compensatory movements when completing ADLs. Pt demonstrating significant improvements in ADLs and functional mobility this progress period.  Patient progressing toward long term goals..  Continue plan of care.  OT Short Term Goals Week 2:  OT Short Term Goal 1 (Week 2): Pt will initiate complete HEP program with Mod I OT Short Term Goal 1 - Progress (Week 2): Met OT Short Term Goal 2 (Week 2): Pt will stand to complete shower with CGA with AE as necessary OT Short Term Goal 2 - Progress (Week 2): Met OT Short Term Goal 3 (Week 2): Pt will demonstrate improved scapular/shoulder stability in order to increase fine motor coordination while completing self care tasks OT Short Term Goal 3 - Progress (Week 2): Met Week 3:  OT Short Term Goal 1 (Week 3): STG=LTG d/t ELOS  Skilled Therapeutic Interventions/Progress Updates:      Therapy Documentation Precautions:  Precautions Precautions: Fall, Cervical Precaution Comments: soft collar when OOB Required Braces or Orthoses: Cervical Brace Cervical Brace: Soft collar Restrictions Weight Bearing Restrictions: No  Tx Session  "Hello." Pt seated in recliner upon OT arrival, agreeable to OT. Pt reports no pain, although soreness potentially from pain management  strategies prior day.   Pt completed 10 minutes of nustep bike in order to increase BUE/BLEstrength and endurance in preparation for increased independence in ADLs such as functional mobility. No rest break necessary until after activity, on level 8 resistance.  Pt issued and reviewed HEP. Pt issued yellow, green and blue therabands for HEP in preparation for D/C. Exercises included were external rotation, diagonal pulls, bicep curls, triceps extension, shoulder horizontal abduction. Pt demonstrated exercises and verbal understood tasks. Pt educated on going within pain limits in order to prevent injury  Pt completed 2 exercises in quadriped on mat table. Pt completed shoulder flexion one arm up at a time 10X with moderate difficulty, then pt completed hip extension one leg at a time with moderate difficulty 10X. Pt completed exercises in order to provide increase balance and stabilization to improve strength and endurance to complete functional mobility for community distances.   Pt seated in recliner at end of session with W/C alarm donned, call light within reach and 4Ps assessed.     Therapy/Group: Individual Therapy  Velia Meyer, OTD, OTR/L 09/11/2022, 12:39 PM

## 2022-09-11 NOTE — Progress Notes (Signed)
Physical Therapy Session Note  Patient Details  Name: Kenneth Flores MRN: [id number] Date of Birth: [birth year]  Today's Date: 09/11/2022 PT Individual Time: 4401-0272, 1400-1430 PT Individual Time Calculation (min): 60 min, 30 min   Short Term Goals: Week 2:  PT Short Term Goal 1 (Week 2): Pt will navigate stairs with single handrail PT Short Term Goal 2 (Week 2): Pt will ambulate >100 ft without AD PT Short Term Goal 3 (Week 2): Pt will navigate 6" hurdles no LOB  Skilled Therapeutic Interventions/Progress Updates:    Session 1: Pt received in recliner and agreeable to therapy.  Pt reports muscle soreness after yesterday's trigger point injections. Advised to drink fluids and positioning as applicable. Session focused on gait in community environments with RT, Misty Stanley, present and leading discussion about community integration, energy conservation, safe community ambulation. Pt navigated smooth level and unlevel surfaces with light CGA-supervision. CGA for steep, multidirectional slope only. Discussed using rail if available for stairs, but pt was able to navigate with cane only safely. With rail and cane, pt navigated steps with alternating pattern at normal speed. Pt ambulated >30 min with 1 seated rest break. Pt returned to unit and used bathroom with distant supervision. Pt then participated in stair training with cane and handrail x 4 flights with seated rest break on ground floor before returning to unit. CGA while descending d/t fatigue. During session, pt with 1-2 small stumbles but pt recovered without assist each time. Pt returned to room and to recliner, was left with all needs in reach and alarm active.   Session 2: pt received in bed and agreeable to therapy. Pt reports continued neck and shoulder soreness, denies any interventions at this time. Pt used bathroom with distant supervision. Pt then participated in forward, backward, lateral stepping activities as well as stepping over obstacles  for improved step reactions with an emphasis on speed for fall prevention. Pt returned to room after session and returned to bed, doffed ted hose and shoes without assist. Pt was left with all needs in reach and alarm active.   Therapy Documentation Precautions:  Precautions Precautions: Fall, Cervical Precaution Comments: soft collar when OOB Required Braces or Orthoses: Cervical Brace Cervical Brace: Soft collar Restrictions Weight Bearing Restrictions: No General:      Therapy/Group: Individual Therapy  [name] 09/11/2022, 10:24 AM

## 2022-09-11 NOTE — Progress Notes (Signed)
Recreational Therapy Session Note  Patient Details  Name: Kenneth Flores MRN: [id number] Date of Birth: [birth year] Today's Date: 09/11/2022  Pain: no c/o Goal:  Pt will ambulate using SPC >1000' on even/uneven outdoor community surfaces with supervision.  MET CTRS/LRT will educate pt on energy conservation techniques, safety awareness during community mobility, negotiating obstacles, public restroom access and self advocacy.  MET Skilled Therapeutic Interventions/Progress Updates: Session focused on activity tolerance during community reintegration tasks.  Pt ambulated throughout the hospital and outside on uneven surfaces (tile, concrete, grass, ramps, steps, curbs with supervision using SPC.  Pt did experience one LOB but was able to recover with Mod I.  Pt only required one seated rest break during the 60 minutes session focused on mobility tasks.  Provided education on energy conservation techniques, time management, self monitoring for fatigue, self advocacy, obstacle negotiation and accessing public restroom.  Pt stated understanding of the above & is looking forward to going out with his wife in the community after discharge.  Jaelee Laughter 09/11/2022, 11:15 AM

## 2022-09-12 NOTE — Progress Notes (Signed)
PROGRESS NOTE   Subjective/Complaints:  Pt doing well, slept ok, pain fairly well managed but isn't sure that the TrP injections helped much. LBM yesterday. Urinating fine. Denies any other complaints or concerns.     ROS:     Pt denies SOB, abd pain, CP, N/V/C/D, and vision changes  Except for HPI  Objective:   No results found. No results for input(s): "WBC", "HGB", "HCT", "PLT" in the last 72 hours.   No results for input(s): "NA", "K", "CL", "CO2", "GLUCOSE", "BUN", "CREATININE", "CALCIUM" in the last 72 hours.    Intake/Output Summary (Last 24 hours) at 09/12/2022 1051 Last data filed at 09/12/2022 0721 Gross per 24 hour  Intake 718 ml  Output --  Net 718 ml         Physical Exam: Vital Signs Blood pressure (!) 139/96, pulse 65, temperature 97.6 F (36.4 C), temperature source Oral, resp. rate 18, height 6' (1.829 m), weight 73.9 kg, SpO2 100 %.   General: awake, alert, appropriate, sitting in bed. NAD HENT: conjugate gaze; oropharynx moist CV: regular rate and rhythm; no JVD Pulmonary: CTA B/L; no W/R/R- good air movement GI: soft, NT, ND, (+)BS- normoactive Psychiatric: appropriate Neurological: Ox3  PRIOR EXAMS: MS; tight in scalenes, upper traps, splenius capitus and levators and rhomboids B/L- somewhat reduced tightness, but still TTP  Skin: incision Clean and intact without signs of breakdown MS: RUE-  deltoid 4/5; Biceps 4/5; Triceps 4-/5; WE 4/5; Grip 2+/5 and FA 2-/5 LUE- Deltoid 4/5; biceps 4/5; Triceps 3+/5; WE 3+/5; grip 2+/5 and FA 2/5   Neuro:  Alert and oriented x 3. Normal insight and awareness. Intact Memory. Normal language and speech. Cranial nerve exam unremarkable. MMT: RUE 4/5 delt, biceps, 4- triceps, 3+WE, 3+/5 HI. LUE 3/5 deltoids, biceps, triceps and    3- to 3 WE and HI. BLE motor grossly 4/5 HF, KE and 4+ ADF/PF. Decreased LT in both arms. Senses pain and LT in both legs.  No abnl resting tone. DTR's absent  Musculoskeletal: still with head forward position, shoulders rotated inward.  Can improve it with cueing. Traps generally tight. No focal TP's appreciated   Assessment/Plan: 1. Functional deficits which require 3+ hours per day of interdisciplinary therapy in a comprehensive inpatient rehab setting. Physiatrist is providing close team supervision and 24 hour management of active medical problems listed below. Physiatrist and rehab team continue to assess barriers to discharge/monitor patient progress toward functional and medical goals  Care Tool:  Bathing    Body parts bathed by patient: Left arm, Chest, Abdomen, Front perineal area, Buttocks, Right upper leg, Left upper leg, Right lower leg, Left lower leg, Face, Right arm   Body parts bathed by helper: Right arm     Bathing assist Assist Level: Supervision/Verbal cueing (in standing)     Upper Body Dressing/Undressing Upper body dressing   What is the patient wearing?: Pull over shirt    Upper body assist Assist Level: Set up assist    Lower Body Dressing/Undressing Lower body dressing      What is the patient wearing?: Pants, Underwear/pull up     Lower body assist Assist for lower body dressing: Supervision/Verbal  cueing     Toileting Toileting    Toileting assist Assist for toileting: Supervision/Verbal cueing     Transfers Chair/bed transfer  Transfers assist     Chair/bed transfer assist level: Supervision/Verbal cueing     Locomotion Ambulation   Ambulation assist      Assist level: Contact Guard/Touching assist Assistive device: Walker-rolling Max distance: 200   Walk 10 feet activity   Assist     Assist level: Contact Guard/Touching assist Assistive device: Walker-rolling   Walk 50 feet activity   Assist    Assist level: Contact Guard/Touching assist Assistive device: Walker-rolling    Walk 150 feet activity   Assist Walk 150 feet  activity did not occur: Safety/medical concerns  Assist level: Contact Guard/Touching assist Assistive device: Walker-rolling    Walk 10 feet on uneven surface  activity   Assist Walk 10 feet on uneven surfaces activity did not occur: Safety/medical concerns         Wheelchair     Assist Is the patient using a wheelchair?: Yes Type of Wheelchair: Manual    Wheelchair assist level: Dependent - Patient 0% Max wheelchair distance: >150    Wheelchair 50 feet with 2 turns activity    Assist        Assist Level: Dependent - Patient 0%   Wheelchair 150 feet activity     Assist      Assist Level: Dependent - Patient 0%   Blood pressure (!) 139/96, pulse 65, temperature 97.6 F (36.4 C), temperature source Oral, resp. rate 18, height 6' (1.829 m), weight 73.9 kg, SpO2 100 %.  Medical Problem List and Plan: 1. 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 Patient is status post three-level anterior cervical decompression and fusion 5/30.              -patient may  shower with incision UNCOVERED             -ELOS/Goals: 09/19/22         -d/c 6/26 -Con't CIR PT and OT- making good gains 2.  Antithrombotics: -DVT/anticoagulation:   Lovenox 40mg  QD   6/20- walking well- will d/w PT how far walking to determine if needs Lovenox at d/c. I think yes, for 1 extra month, since is central cord syndrome             -antiplatelet therapy: none   3. Pain Management: Tylenol, Norco 5 or 10, and Robaxin as needed - Valium 2 mg q6 hours prn for muscle tightness of neck - lidoderm 2 patches 8pm to 8am -6/14  gabapentin 600mg  tid  -add kpad for shoulder girdles  -has menthol cream as well as above meds -discussed importance of improving his head and shoulder/neck posture in the short term and long term -6/15-16/24 pain well controlled, cont regimen, will discuss with weekday team if gabapentin is  helping or if he wants to try increasing dose--objectively seems to be less bothered by the paresthesias, but still reports them being present and bothersome -6/17- will add Cymbalta 30 mg at bedtime for nerve pain- will have pt have lidoderm put on neck and if doesn't work, might benefit from Trp injections.  -6/18- no side effects from Cymbalta- should take some time to help nerve pain -6/19- will do Trigger point injections in AM-educated what they do- no side effects from cymbalta -6/20- Trigger point injections today -6/21- hasn't felt much improvement from TrP injections so far-  even though muscles feel looser- will increase Duloxetine to 60 mg at bedtime- and see if that helps.  -09/12/22 about the same, still monitoring for improvement, understands it will take time 4. Mood/Behavior/Sleep: LCSW to evaluate and provide emotional support             -antipsychotic agents: n/a - melatonin 5mg  QHS; also has trazodone PRN -sleep improved, cont regimen   -09/12/22 sleeping well 5. Neuropsych/cognition: This patient is capable of making decisions on his own behalf.   6. Skin/Wound Care: Routine skin care checks             -monitor surgical incision   7. Fluids/Electrolytes/Nutrition: Routine Is and Os and follow-up chemistries -Na+ holding at 130 6/10, other labs look good    -6/17- Na 136 today 8: Constipation due to neurogenic bowel: currently Colace 100 mg BID, Miralax QD, dulcolax 10mg  suppository QD             -had results with sorbitol yesterday -6/12-stopped evening suppository as he's having urge/sensation to empty  -6/14 had bm last evening on his own.  -09/12/22 BMs seem regular, cont regimen  9: History of chronic hepatitis C: completed treatment; follows with ID   10. Neurogenic bladder with urinary retention-   -timed voiding attempts every 6 hours. Scan patient after voids and have pt cathed if volumes >350cc-   - up to bsc, toilet, bladder massage and double  voiding  -6/10 now voiding continently and with low PVR's (122)  -09/06/22 no ongoing retention  -6/18- no issues/resolved 11. Post-op dysphagia---due to cervical surgery. Struggling with solids -can continue D3 diet for now  - cepacol and chloraseptic spray PRN  12. Elevated BPs: -remotely on HCTZ 25mg  QD and Lisinopril 40mg  QD;  -6/9 HCTZ  restarted--added 10mg  lisinopril also 6/10 -6/14 improved control in general, consider increasing lisinopril -6/15-16/24 improving control, cont regimen.  -6/21-22/24- BP doing better- con't to monitor and con't regimen Vitals:   09/08/22 1319 09/08/22 2008 09/09/22 0315 09/09/22 1257  BP: 119/76 119/76 (!) 149/83 (!) 146/83   09/09/22 1941 09/10/22 0529 09/10/22 1317 09/10/22 2000  BP: 129/77 112/80 111/73 127/84   09/11/22 0609 09/11/22 1254 09/11/22 1951 09/12/22 0504  BP: 126/89 132/72 135/86 (!) 139/96     13. Tobacco use: no cravings per H&P, encourage cessation on discharge.     LOS: 16 days A FACE TO FACE EVALUATION WAS PERFORMED  [address] 09/12/2022, 10:51 AM

## 2022-09-13 NOTE — Progress Notes (Addendum)
PROGRESS NOTE   Subjective/Complaints:  Pt doing well again today, slept ok, pain pretty well controlled today, about the same as usual. LBM the day before yesterday but then had one this morning after exam. Urinating fine. Denies any other complaints or concerns.     ROS:     Pt denies SOB, abd pain, CP, N/V/C/D, and vision changes  Except for HPI  Objective:   No results found. No results for input(s): "WBC", "HGB", "HCT", "PLT" in the last 72 hours.   No results for input(s): "NA", "K", "CL", "CO2", "GLUCOSE", "BUN", "CREATININE", "CALCIUM" in the last 72 hours.    Intake/Output Summary (Last 24 hours) at 09/13/2022 1105 Last data filed at 09/13/2022 0755 Gross per 24 hour  Intake 720 ml  Output --  Net 720 ml         Physical Exam: Vital Signs Blood pressure 127/76, pulse 73, temperature 97.9 F (36.6 C), resp. rate 16, height 6' (1.829 m), weight 73.9 kg, SpO2 99 %.   General: awake, alert, appropriate, sitting in bed. NA. Appears the most comfortable he's been HENT: conjugate gaze; oropharynx moist CV: regular rate and rhythm; no JVD Pulmonary: CTA B/L; no W/R/R- good air movement GI: soft, NT, ND, (+)BS- normoactive Psychiatric: appropriate Neurological: Ox3  PRIOR EXAMS: MS; tight in scalenes, upper traps, splenius capitus and levators and rhomboids B/L- somewhat reduced tightness, but still TTP  Skin: incision Clean and intact without signs of breakdown MS: RUE-  deltoid 4/5; Biceps 4/5; Triceps 4-/5; WE 4/5; Grip 2+/5 and FA 2-/5 LUE- Deltoid 4/5; biceps 4/5; Triceps 3+/5; WE 3+/5; grip 2+/5 and FA 2/5   Neuro:  Alert and oriented x 3. Normal insight and awareness. Intact Memory. Normal language and speech. Cranial nerve exam unremarkable. MMT: RUE 4/5 delt, biceps, 4- triceps, 3+WE, 3+/5 HI. LUE 3/5 deltoids, biceps, triceps and    3- to 3 WE and HI. BLE motor grossly 4/5 HF, KE and 4+ ADF/PF.  Decreased LT in both arms. Senses pain and LT in both legs. No abnl resting tone. DTR's absent  Musculoskeletal: still with head forward position, shoulders rotated inward.  Can improve it with cueing. Traps generally tight. No focal TP's appreciated   Assessment/Plan: 1. Functional deficits which require 3+ hours per day of interdisciplinary therapy in a comprehensive inpatient rehab setting. Physiatrist is providing close team supervision and 24 hour management of active medical problems listed below. Physiatrist and rehab team continue to assess barriers to discharge/monitor patient progress toward functional and medical goals  Care Tool:  Bathing    Body parts bathed by patient: Left arm, Chest, Abdomen, Front perineal area, Buttocks, Right upper leg, Left upper leg, Right lower leg, Left lower leg, Face, Right arm   Body parts bathed by helper: Right arm     Bathing assist Assist Level: Supervision/Verbal cueing (in standing)     Upper Body Dressing/Undressing Upper body dressing   What is the patient wearing?: Pull over shirt    Upper body assist Assist Level: Set up assist    Lower Body Dressing/Undressing Lower body dressing      What is the patient wearing?: Pants, Underwear/pull up  Lower body assist Assist for lower body dressing: Supervision/Verbal cueing     Toileting Toileting    Toileting assist Assist for toileting: Supervision/Verbal cueing     Transfers Chair/bed transfer  Transfers assist     Chair/bed transfer assist level: Supervision/Verbal cueing     Locomotion Ambulation   Ambulation assist      Assist level: Contact Guard/Touching assist Assistive device: Walker-rolling Max distance: 200   Walk 10 feet activity   Assist     Assist level: Contact Guard/Touching assist Assistive device: Walker-rolling   Walk 50 feet activity   Assist    Assist level: Contact Guard/Touching assist Assistive device: Walker-rolling     Walk 150 feet activity   Assist Walk 150 feet activity did not occur: Safety/medical concerns  Assist level: Contact Guard/Touching assist Assistive device: Walker-rolling    Walk 10 feet on uneven surface  activity   Assist Walk 10 feet on uneven surfaces activity did not occur: Safety/medical concerns         Wheelchair     Assist Is the patient using a wheelchair?: Yes Type of Wheelchair: Manual    Wheelchair assist level: Dependent - Patient 0% Max wheelchair distance: >150    Wheelchair 50 feet with 2 turns activity    Assist        Assist Level: Dependent - Patient 0%   Wheelchair 150 feet activity     Assist      Assist Level: Dependent - Patient 0%   Blood pressure 127/76, pulse 73, temperature 97.9 F (36.6 C), resp. rate 16, height 6' (1.829 m), weight 73.9 kg, SpO2 99 %.  Medical Problem List and Plan: 1. 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 Patient is status post three-level anterior cervical decompression and fusion 5/30.              -patient may  shower with incision UNCOVERED             -ELOS/Goals: 09/16/22 -Con't CIR PT and OT- making good gains -09/13/22 grounds pass ordered, discussed rules of pass 2.  Antithrombotics: -DVT/anticoagulation:   Lovenox 40mg  QD   6/20- walking well- will d/w PT how far walking to determine if needs Lovenox at d/c. I think yes, for 1 extra month, since is central cord syndrome             -antiplatelet therapy: none   3. Pain Management: Tylenol, Norco 5 or 10, and Robaxin as needed - Valium 2 mg q6 hours prn for muscle tightness of neck - lidoderm 2 patches 8pm to 8am -6/14  gabapentin 600mg  tid  -add kpad for shoulder girdles  -has menthol cream as well as above meds -discussed importance of improving his head and shoulder/neck posture in the short term and long term -6/15-16/24 pain well controlled,  cont regimen, will discuss with weekday team if gabapentin is helping or if he wants to try increasing dose--objectively seems to be less bothered by the paresthesias, but still reports them being present and bothersome -6/17- will add Cymbalta 30 mg at bedtime for nerve pain- will have pt have lidoderm put on neck and if doesn't work, might benefit from Trp injections.  -6/18- no side effects from Cymbalta- should take some time to help nerve pain -6/19- will do Trigger point injections in AM-educated what they do- no side effects from cymbalta -6/20- Trigger point injections today -6/21- hasn't felt much improvement from TrP  injections so far- even though muscles feel looser- will increase Duloxetine to 60 mg at bedtime- and see if that helps.  -09/12/22 about the same, still monitoring for improvement, understands it will take time 4. Mood/Behavior/Sleep: LCSW to evaluate and provide emotional support             -antipsychotic agents: n/a - melatonin 5mg  QHS; also has trazodone PRN -sleep improved, cont regimen   -6/22-23/24 sleeping well 5. Neuropsych/cognition: This patient is capable of making decisions on his own behalf.   6. Skin/Wound Care: Routine skin care checks             -monitor surgical incision   7. Fluids/Electrolytes/Nutrition: Routine Is and Os and follow-up chemistries -Na+ holding at 130 6/10, other labs look good    -6/17- Na 136 today 8: Constipation due to neurogenic bowel: currently Colace 100 mg BID, Miralax QD, dulcolax 10mg  suppository QD             -had results with sorbitol yesterday -6/12-stopped evening suppository as he's having urge/sensation to empty  -6/14 had bm last evening on his own.  -6/22-23/24 BMs seem regular, cont regimen  9: History of chronic hepatitis C: completed treatment; follows with ID   10. Neurogenic bladder with urinary retention-   -timed voiding attempts every 6 hours. Scan patient after voids and have pt cathed if volumes  >350cc-   - up to bsc, toilet, bladder massage and double voiding  -6/10 now voiding continently and with low PVR's (122)  -09/06/22 no ongoing retention  -6/18- no issues/resolved 11. Post-op dysphagia---due to cervical surgery. Struggling with solids -can continue D3 diet for now  - cepacol and chloraseptic spray PRN  12. Elevated BPs: -remotely on HCTZ 25mg  QD and Lisinopril 40mg  QD;  -6/9 HCTZ  restarted--added 10mg  lisinopril also 6/10 -6/14 improved control in general, consider increasing lisinopril -6/15-16/24 improving control, cont regimen.  -6/21-23/24- BP doing better- con't to monitor and con't regimen Vitals:   09/09/22 1257 09/09/22 1941 09/10/22 0529 09/10/22 1317  BP: (!) 146/83 129/77 112/80 111/73   09/10/22 2000 09/11/22 0609 09/11/22 1254 09/11/22 1951  BP: 127/84 126/89 132/72 135/86   09/12/22 0504 09/12/22 1329 09/12/22 2300 09/13/22 0508  BP: (!) 139/96 121/69 107/62 127/76     13. Tobacco use: no cravings per H&P, encourage cessation on discharge.     LOS: 17 days A FACE TO FACE EVALUATION WAS PERFORMED  [address] 09/13/2022, 11:05 AM

## 2022-09-14 LAB — BASIC METABOLIC PANEL
Anion gap: 8 (ref 5–15)
BUN: 15 mg/dL (ref 6–20)
CO2: 26 mmol/L (ref 22–32)
Calcium: 9 mg/dL (ref 8.9–10.3)
Chloride: 98 mmol/L (ref 98–111)
Creatinine, Ser: 0.85 mg/dL (ref 0.61–1.24)
GFR, Estimated: >60 mL/min (ref >60)
Glucose, Bld: 96 mg/dL (ref 70–99)
Potassium: 4.1 mmol/L (ref 3.5–5.1)
Sodium: 132 mmol/L — ABNORMAL LOW (ref 135–145)

## 2022-09-14 LAB — CBC
HCT: 38.3 % — ABNORMAL LOW (ref 39.0–52.0)
Hemoglobin: 13.9 g/dL (ref 13.0–17.0)
MCH: 33.8 pg (ref 26.0–34.0)
MCHC: 36.3 g/dL — ABNORMAL HIGH (ref 30.0–36.0)
MCV: 93.2 fL (ref 80.0–100.0)
Platelets: 169 10*3/uL (ref 150–400)
RBC: 4.11 MIL/uL — ABNORMAL LOW (ref 4.22–5.81)
RDW: 11.2 % — ABNORMAL LOW (ref 11.5–15.5)
WBC: 6 10*3/uL (ref 4.0–10.5)
nRBC: 0 % (ref 0.0–0.2)

## 2022-09-14 NOTE — Progress Notes (Signed)
Occupational Therapy Session Note  Patient Details  Name: Kenneth Flores MRN: [id number] Date of Birth: 15-Mar-1961  Today's Date: 09/14/2022 OT Individual Time: 8295-6213 OT Individual Time Calculation (min): 71 min    Short Term Goals: Week 3:  OT Short Term Goal 1 (Week 3): STG=LTG d/t ELOS  Skilled Therapeutic Interventions/Progress Updates:    Pt sitting EOB upon arrival. Pt requested to wash face and brush teeth. Amb with SPC to sink and standing at sink to complete tasks with supervision. Pt amb with SPC to day room with supervision. Standing task tossing/bouncing therapy ball and basketball. Dribbling basketball with BUE. Amb without AD with close supervision. Kicking Hoverball with CGA in hallway with CGA. Pt returned to room and sat in recliner. All needs within reach. NT Bria present.   Therapy Documentation Precautions:  Precautions Precautions: Fall, Cervical Precaution Comments: soft collar when OOB Required Braces or Orthoses: Cervical Brace Cervical Brace: Soft collar Restrictions Weight Bearing Restrictions: No Pain: Pt c/o 7/10 upper traps pain; MD aware   Therapy/Group: Individual Therapy  [name] 09/14/2022, 8:16 AM

## 2022-09-14 NOTE — Progress Notes (Signed)
Patient ID: Kenneth Flores, male   DOB: 1960-06-25, 62 y.o.   MRN: [id number]  SW spoke with pt wife to inform on d/c recs: TTB, 3in1 BSC, and SPC. Wife reports she has TTB, and does not think 3in1 BSC is needed. Would like SW to order [facility]. Preferred outpatient location is [facility] ([address]).   SW ordered [facility] with [employer] via parachute.   Cecile Sheerer, MSW, LCSWA Office: [phone] Cell: [phone] Fax: [phone]

## 2022-09-14 NOTE — Progress Notes (Signed)
Physical Therapy Session Note  Patient Details  Name: Kenneth Flores MRN: [id number] Date of Birth: April 03, 1960  Today's Date: 09/14/2022 PT Individual Time: 1105-1200 PT Individual Time Calculation (min): 55 min   Short Term Goals: Week 2:  PT Short Term Goal 1 (Week 2): Pt will navigate stairs with single handrail PT Short Term Goal 2 (Week 2): Pt will ambulate >100 ft without AD PT Short Term Goal 3 (Week 2): Pt will navigate 6" hurdles no LOB  Skilled Therapeutic Interventions/Progress Updates:    pt received in recliner and agreeable to therapy. Pt reports 7/10 pain, addressed with manual therapy and triggerpoint release to SCM, upper trap, and levator scap. Pt reports pain down to 5/10 after manual. Standing>supine on mat table with supervision. Session then focused on gait with and without Single point cane. Pt supervision with Single point cane but occasional stumbles that largely do not require assist to recover. Pt performed 4lb farmer carry with and without cane with CGA overall. Pt then tossed phsyio ball with tech while ambulating without cane for several minutes (>200 ft), progressed to smaller 1kg ball (>300 ft) with small increase in ataxic movements, but no LOB. At end of session pt ambulated backward while tossing 1kg ball with no LOB and CGA. Pt returned to room and to recliner, was left with all needs in reach and alarm active.   Therapy Documentation Precautions:  Precautions Precautions: Fall, Cervical Precaution Comments: soft collar when OOB Required Braces or Orthoses: Cervical Brace Cervical Brace: Soft collar Restrictions Weight Bearing Restrictions: No General:       Therapy/Group: Individual Therapy  [name] 09/14/2022, 3:44 PM

## 2022-09-14 NOTE — Progress Notes (Signed)
Physical Therapy Session Note  Patient Details  Name: Kenneth Flores MRN: [id number] Date of Birth: Aug 17, 1960  Today's Date: 09/14/2022 PT Individual Time: 0915-0945 PT Individual Time Calculation (min): 30 min   Short Term Goals: Week 2:  PT Short Term Goal 1 (Week 2): Pt will navigate stairs with single handrail PT Short Term Goal 2 (Week 2): Pt will ambulate >100 ft without AD PT Short Term Goal 3 (Week 2): Pt will navigate 6" hurdles no LOB  Skilled Therapeutic Interventions/Progress Updates:      Therapy Documentation Precautions:  Precautions Precautions: Fall, Cervical Precaution Comments: soft collar when OOB Required Braces or Orthoses: Cervical Brace Cervical Brace: Soft collar Restrictions Weight Bearing Restrictions: No  Pt agreeable to PT session with emphasis on fine motor hand and digit coordination with household activities to prepare for discharge. Pt without reports of pain in session and (S) for all mobility in session including gait >300 ft with and without SPC. Pt folded and organized laundry in standing on unlevel surface (airex foam pad). Pt held laundry basket with towels and performed mini squats to retrieve various targets (bean bags) on floor. Pt demonstrates good squat technique/form. Pt transitioned to modified sweeping of bean bags for high level balance/coordination training. Pt cleaned all equipment and able to incorporate bilateral hands while wiping down and placing objects away. Pt left seated in w/c at bedside with all needs in reach.    Therapy/Group: Individual Therapy  Truitt Leep Truitt Leep PT, DPT  09/14/2022, 5:47 AM

## 2022-09-14 NOTE — Progress Notes (Signed)
PROGRESS NOTE   Subjective/Complaints:  Pt reports pain still an issue- TrP injections were not helpful at all.  Waiting for Cymbalta to kick in.     ROS:    Pt denies SOB, abd pain, CP, N/V/C/D, and vision changes   Except for HPI  Objective:   No results found. Recent Labs    09/14/22 0606  WBC 6.0  HGB 13.9  HCT 38.3*  PLT 169     Recent Labs    09/14/22 0606  NA 132*  K 4.1  CL 98  CO2 26  GLUCOSE 96  BUN 15  CREATININE 0.85  CALCIUM 9.0      Intake/Output Summary (Last 24 hours) at 09/14/2022 0852 Last data filed at 09/14/2022 0715 Gross per 24 hour  Intake 716 ml  Output 650 ml  Net 66 ml         Physical Exam: Vital Signs Blood pressure 127/76, pulse 62, temperature 97.8 F (36.6 C), resp. rate 16, height 6' (1.829 m), weight 73.9 kg, SpO2 100 %.   General: awake, alert, appropriate, standing/walking with OT- HHA; NAD HENT: conjugate gaze; oropharynx moist-wearing collar CV: regular rate and rhythm; no JVD Pulmonary: CTA B/L; no W/R/R- good air movement GI: soft, NT, ND, (+)BS- normoactive Psychiatric: appropriate Neurological: Ox3   PRIOR EXAMS: MS; tight in scalenes, upper traps, splenius capitus and levators and rhomboids B/L- somewhat reduced tightness, but still TTP  Skin: incision Clean and intact without signs of breakdown MS: RUE-  deltoid 4/5; Biceps 4/5; Triceps 4-/5; WE 4/5; Grip 2+/5 and FA 2-/5 LUE- Deltoid 4/5; biceps 4/5; Triceps 3+/5; WE 3+/5; grip 2+/5 and FA 2/5 MS; Shoulders still tight, but much better than before injections, but hasn't helped pain.   Neuro:  Alert and oriented x 3. Normal insight and awareness. Intact Memory. Normal language and speech. Cranial nerve exam unremarkable. MMT: RUE 4/5 delt, biceps, 4- triceps, 3+WE, 3+/5 HI. LUE 3/5 deltoids, biceps, triceps and    3- to 3 WE and HI. BLE motor grossly 4/5 HF, KE and 4+ ADF/PF. Decreased LT in  both arms. Senses pain and LT in both legs. No abnl resting tone. DTR's absent  Musculoskeletal: still with head forward position, shoulders rotated inward.  Can improve it with cueing. Traps generally tight. No focal TP's appreciated   Assessment/Plan: 1. Functional deficits which require 3+ hours per day of interdisciplinary therapy in a comprehensive inpatient rehab setting. Physiatrist is providing close team supervision and 24 hour management of active medical problems listed below. Physiatrist and rehab team continue to assess barriers to discharge/monitor patient progress toward functional and medical goals  Care Tool:  Bathing    Body parts bathed by patient: Left arm, Chest, Abdomen, Front perineal area, Buttocks, Right upper leg, Left upper leg, Right lower leg, Left lower leg, Face, Right arm   Body parts bathed by helper: Right arm     Bathing assist Assist Level: Supervision/Verbal cueing     Upper Body Dressing/Undressing Upper body dressing   What is the patient wearing?: Pull over shirt    Upper body assist Assist Level: Independent    Lower Body Dressing/Undressing Lower body dressing  What is the patient wearing?: Pants, Underwear/pull up     Lower body assist Assist for lower body dressing: Supervision/Verbal cueing     Toileting Toileting    Toileting assist Assist for toileting: Supervision/Verbal cueing     Transfers Chair/bed transfer  Transfers assist     Chair/bed transfer assist level: Supervision/Verbal cueing     Locomotion Ambulation   Ambulation assist      Assist level: Contact Guard/Touching assist Assistive device: Walker-rolling Max distance: 200   Walk 10 feet activity   Assist     Assist level: Contact Guard/Touching assist Assistive device: Walker-rolling   Walk 50 feet activity   Assist    Assist level: Contact Guard/Touching assist Assistive device: Walker-rolling    Walk 150 feet  activity   Assist Walk 150 feet activity did not occur: Safety/medical concerns  Assist level: Contact Guard/Touching assist Assistive device: Walker-rolling    Walk 10 feet on uneven surface  activity   Assist Walk 10 feet on uneven surfaces activity did not occur: Safety/medical concerns         Wheelchair     Assist Is the patient using a wheelchair?: Yes Type of Wheelchair: Manual    Wheelchair assist level: Dependent - Patient 0% Max wheelchair distance: >150    Wheelchair 50 feet with 2 turns activity    Assist        Assist Level: Dependent - Patient 0%   Wheelchair 150 feet activity     Assist      Assist Level: Dependent - Patient 0%   Blood pressure 127/76, pulse 62, temperature 97.8 F (36.6 C), resp. rate 16, height 6' (1.829 m), weight 73.9 kg, SpO2 100 %.  Medical Problem List and Plan: 1. 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 Patient is status post three-level anterior cervical decompression and fusion 5/30.              -patient may  shower with incision UNCOVERED             -ELOS/Goals: 09/16/22 -Con't CIR PT and OT- making good gains -Con't CIR PT and OT 2.  Antithrombotics: -DVT/anticoagulation:   Lovenox 40mg  QD   6/20- walking well- will d/w PT how far walking to determine if needs Lovenox at d/c. I think yes, for 1 extra month, since is central cord syndrome             -antiplatelet therapy: none   3. Pain Management: Tylenol, Norco 5 or 10, and Robaxin as needed - Valium 2 mg q6 hours prn for muscle tightness of neck - lidoderm 2 patches 8pm to 8am -6/14  gabapentin 600mg  tid  -add kpad for shoulder girdles  -has menthol cream as well as above meds -discussed importance of improving his head and shoulder/neck posture in the short term and long term -6/15-16/24 pain well controlled, cont regimen, will discuss with weekday team if  gabapentin is helping or if he wants to try increasing dose--objectively seems to be less bothered by the paresthesias, but still reports them being present and bothersome -6/17- will add Cymbalta 30 mg at bedtime for nerve pain- will have pt have lidoderm put on neck and if doesn't work, might benefit from Trp injections.  -6/18- no side effects from Cymbalta- should take some time to help nerve pain -6/19- will do Trigger point injections in AM-educated what they do- no side effects from cymbalta -6/20- Trigger point  injections today -6/21- hasn't felt much improvement from TrP injections so far- even though muscles feel looser- will increase Duloxetine to 60 mg at bedtime- and see if that helps.  -09/12/22 about the same, still monitoring for improvement, understands it will take time 6/24- pain about the same- but Cymbalta usually takes 10+ days to kick in- explained to pt. No response to Trp injections even though tightness has improved some 4. Mood/Behavior/Sleep: LCSW to evaluate and provide emotional support             -antipsychotic agents: n/a - melatonin 5mg  QHS; also has trazodone PRN -sleep improved, cont regimen   -6/22-23/24 sleeping well 5. Neuropsych/cognition: This patient is capable of making decisions on his own behalf.   6. Skin/Wound Care: Routine skin care checks             -monitor surgical incision   7. Fluids/Electrolytes/Nutrition: Routine Is and Os and follow-up chemistries -Na+ holding at 130 6/10, other labs look good    -6/17- Na 136 today  6/24- Na 132 8: Constipation due to neurogenic bowel: currently Colace 100 mg BID, Miralax QD, dulcolax 10mg  suppository QD             -had results with sorbitol yesterday -6/12-stopped evening suppository as he's having urge/sensation to empty  -6/14 had bm last evening on his own.  -6/22-23/24 BMs seem regular, cont regimen 6/24- LBM yesterday- going daily with PO laxatives 9: History of chronic hepatitis C: completed  treatment; follows with ID   10. Neurogenic bladder with urinary retention-   -timed voiding attempts every 6 hours. Scan patient after voids and have pt cathed if volumes >350cc-   - up to bsc, toilet, bladder massage and double voiding  -6/10 now voiding continently and with low PVR's (122)  -09/06/22 no ongoing retention  -6/18- no issues/resolved  6/24 will d/c PVRs/bladder scans 11. Post-op dysphagia---due to cervical surgery. Struggling with solids -can continue D3 diet for now  - cepacol and chloraseptic spray PRN  12. Elevated Bps HTN: -remotely on HCTZ 25mg  QD and Lisinopril 40mg  QD;  -6/9 HCTZ  restarted--added 10mg  lisinopril also 6/10 6/24- BP controlled on meds- con't regimen Vitals:   09/10/22 0529 09/10/22 1317 09/10/22 2000 09/11/22 0609  BP: 112/80 111/73 127/84 126/89   09/11/22 1254 09/11/22 1951 09/12/22 0504 09/12/22 1329  BP: 132/72 135/86 (!) 139/96 121/69   09/12/22 2300 09/13/22 0508 09/13/22 1923 09/14/22 0545  BP: 107/62 127/76 129/72 127/76     13. Tobacco use: no cravings per H&P, encourage cessation on discharge.    I spent a total of 35   minutes on total care today- >50% coordination of care- due to review of labs, vitals and d/w OTA this AM   LOS: 18 days A FACE TO FACE EVALUATION WAS PERFORMED  Martavion Couper 09/14/2022, 8:52 AM

## 2022-09-14 NOTE — Progress Notes (Signed)
Occupational Therapy Session Note  Patient Details  Name: Kenneth Flores MRN: [id number] Date of Birth: [birth year]  Today's Date: 09/14/2022 OT Individual Time: 1345-1430 OT Individual Time Calculation (min): 45 min    Short Term Goals: Week 3:  OT Short Term Goal 1 (Week 3): STG=LTG d/t ELOS  Skilled Therapeutic Interventions/Progress Updates:    OT intervention with focus on functional amb with SPC and without SPC in community envirionment, standing balance, discharge planning, and home safety. Pt amb with SPC to [facility] and out into courtyard-supervision. Amb on uneven surfaces with CGA. Pt amb without AD back to Rehab floor with CGA. 7 mins standing at [employer] level 4 random. Pt amb up ramp onto mulch without AD-CGA. Pt amb to gym. Ball toss/weighted ball toss (4#) against mini trampoline standing on Air[employer]-CGA. Pt amb without AD while holding 3# weight in LUE. Pt returned to room and sat EOB. Pt removed shoes and [name]. Pt remained seated EOB with all needs within reach.   Therapy Documentation Precautions:  Precautions Precautions: Fall, Cervical Precaution Comments: soft collar when OOB Required Braces or Orthoses: Cervical Brace Cervical Brace: Soft collar Restrictions Weight Bearing Restrictions: No Pain:  Pt reports 6/10 upper traps pain; meds admin prior to therapy    Therapy/Group: Individual Therapy  [name] 09/14/2022, 2:46 PM

## 2022-09-15 ENCOUNTER — Other Ambulatory Visit (HOSPITAL_COMMUNITY): Payer: Self-pay

## 2022-09-15 MED ORDER — APIXABAN 2.5 MG PO TABS
2.5000 mg | ORAL_TABLET | Freq: Two times a day (BID) | ORAL | Status: DC
  Administered 2022-09-15 – 2022-09-16 (×3): 2.5 mg via ORAL
  Filled 2022-09-15 (×3): qty 1

## 2022-09-15 MED ORDER — GABAPENTIN 300 MG PO CAPS
600.0000 mg | ORAL_CAPSULE | Freq: Three times a day (TID) | ORAL | 0 refills | Status: DC
  Filled 2022-09-15: qty 180, 30d supply, fill #0

## 2022-09-15 MED ORDER — HYDROCODONE-ACETAMINOPHEN 10-325 MG PO TABS
1.0000 | ORAL_TABLET | ORAL | 0 refills | Status: DC | PRN
  Filled 2022-09-15: qty 42, 4d supply, fill #0

## 2022-09-15 MED ORDER — DOCUSATE SODIUM 100 MG PO CAPS: 100.0000 mg | ORAL_CAPSULE | Freq: Two times a day (BID) | ORAL | 0 refills | Status: DC

## 2022-09-15 MED ORDER — DULOXETINE HCL 60 MG PO CPEP
60.0000 mg | ORAL_CAPSULE | Freq: Every day | ORAL | 0 refills | Status: DC
  Filled 2022-09-15: qty 30, 30d supply, fill #0

## 2022-09-15 MED ORDER — MELATONIN 5 MG PO TABS
5.0000 mg | ORAL_TABLET | Freq: Every day | ORAL | 0 refills | Status: DC
  Filled 2022-09-15: qty 30, 30d supply, fill #0

## 2022-09-15 MED ORDER — HYDROCHLOROTHIAZIDE 25 MG PO TABS
25.0000 mg | ORAL_TABLET | Freq: Every day | ORAL | 0 refills | Status: DC
  Filled 2022-09-15: qty 30, 30d supply, fill #0

## 2022-09-15 MED ORDER — POLYETHYLENE GLYCOL 3350 17 G PO PACK: 17.0000 g | PACK | Freq: Every day | ORAL | 0 refills | Status: DC

## 2022-09-15 MED ORDER — APIXABAN 2.5 MG PO TABS
2.5000 mg | ORAL_TABLET | Freq: Two times a day (BID) | ORAL | 0 refills | Status: DC
  Filled 2022-09-15: qty 60, 30d supply, fill #0

## 2022-09-15 MED ORDER — ACETAMINOPHEN 325 MG PO TABS: 325.0000 mg | ORAL_TABLET | ORAL | Status: DC | PRN

## 2022-09-15 MED ORDER — METHOCARBAMOL 500 MG PO TABS
500.0000 mg | ORAL_TABLET | Freq: Four times a day (QID) | ORAL | 0 refills | Status: DC | PRN
  Filled 2022-09-15: qty 30, 8d supply, fill #0

## 2022-09-15 MED ORDER — LISINOPRIL 10 MG PO TABS
10.0000 mg | ORAL_TABLET | Freq: Every day | ORAL | 0 refills | Status: DC
  Filled 2022-09-15: qty 30, 30d supply, fill #0

## 2022-09-15 NOTE — Progress Notes (Signed)
Occupational Therapy Discharge Summary  Patient Details  Name: Kenneth Flores MRN: [id number] Date of Birth: May 26, 1960  Date of Discharge from OT service:September 15, 2022  Patient has met 10 of 10 long term goals due to improved activity tolerance, improved balance, postural control, ability to compensate for deficits, functional use of  RIGHT upper, RIGHT lower, LEFT upper, and LEFT lower extremity, and improved coordination.  Pt made excellent progress with BADLs and functional transfers during rhis admission. Pt completes all bathing, dressing, and toileting tasks at mod I level. Pt is mod I for all functional transfers. Pt uses his LUE at diminished level and continues to demonstrate daily improvements. Pt's wife has been present and participated in therapy. Patient to discharge at overall Modified Independent level for BADL's and S for higher level mobility with SPC.   Patient's care partner is independent to provide the necessary physical assistance at discharge.    Reasons goals not met: n/a  Recommendation:  Patient will benefit from ongoing skilled OT services in outpatient setting to continue to advance functional skills in the area of BADL, iADL, Vocation, and Reduce care partner burden.  Equipment: No equipment provided  Reasons for discharge: treatment goals met  Patient/family agrees with progress made and goals achieved: Yes  OT Discharge ADL ADL Eating: Modified independent Where Assessed-Eating: Chair Grooming: Modified independent Where Assessed-Grooming: Sitting at sink Upper Body Bathing: Modified independent Where Assessed-Upper Body Bathing: Shower Lower Body Bathing: Modified independent Where Assessed-Lower Body Bathing: Shower Upper Body Dressing: Modified independent (Device) Where Assessed-Upper Body Dressing: Edge of bed Lower Body Dressing: Modified independent Where Assessed-Lower Body Dressing: Edge of bed Toileting: Modified independent Where  Assessed-Toileting: Teacher, adult education: Engineer, agricultural Method: Insurance claims handler: Modified independent Web designer Method: Ship broker: Insurance underwriter: Modified independent Film/video editor Method: Designer, industrial/product: Sales promotion account executive Baseline Vision/History: 1 Wears glasses Patient Visual Report: No change from baseline Vision Assessment?: No apparent visual deficits Eye Alignment: Within Chemical engineer  Perception: Within Functional Limits Praxis Praxis: Intact Cognition Cognition Overall Cognitive Status: Within Functional Limits for tasks assessed Arousal/Alertness: Awake/alert Orientation Level: Person;Place;Situation Person: Oriented Place: Oriented Situation: Oriented Memory: Appears intact Attention: Sustained;Selective Sustained Attention: Appears intact Selective Attention: Appears intact Awareness: Appears intact Problem Solving: Appears intact Safety/Judgment: Appears intact Brief Interview for Mental Status (BIMS) Repetition of Three Words (First Attempt): 3 Temporal Orientation: Year: Correct Temporal Orientation: Month: Accurate within 5 days Temporal Orientation: Day: Correct Recall: "Sock": Yes, no cue required Recall: "Blue": Yes, no cue required Recall: "Bed": Yes, no cue required BIMS Summary Score: 15 Sensation Sensation Light Touch: Impaired by gross assessment Proprioception: Appears Intact Stereognosis: Not tested Motor  Motor Motor: Tetraplegia   Trunk/Postural Assessment  Cervical Assessment Cervical Assessment: Exceptions to [facility] (soft cervical collar) Thoracic Assessment Thoracic Assessment: Exceptions to [facility] (s/p ACD) Lumbar Assessment Lumbar Assessment: Within Functional Limits Postural Control Postural Control: Within Functional Limits  Balance Static Sitting Balance Static Sitting -  Balance Support: Feet supported Static Sitting - Level of Assistance: 6: Modified independent (Device/Increase time) Dynamic Sitting Balance Dynamic Sitting - Balance Support: During functional activity Dynamic Sitting - Level of Assistance: 6: Modified independent (Device/Increase time) Extremity/Trunk Assessment RUE Assessment RUE Assessment: Within Functional Limits LUE Assessment LUE Assessment: Exceptions to [facility] Passive Range of Motion (PROM) Comments: PROM General Strength Comments: 3+/5 overall LUE AROM (degrees) Overall AROM Left Upper Extremity: Deficits Left Shoulder Flexion: 110 Degrees Left Composite  Finger Extension: 75% Left Composite Finger Flexion: 75% Left Thumb Opposition: Digit 2;Digit 3;Digit 4 LUE Strength Left Shoulder Flexion: 3+/5 Left Elbow Extension: 4-/5 Left Hand Gross Grasp: Impaired   [name] 09/15/2022, 7:03 AM

## 2022-09-15 NOTE — Patient Care Conference (Signed)
Inpatient RehabilitationTeam Conference and Plan of Care Update Date: 09/15/2022   Time: 11:11 AM    Patient Name: Kenneth Flores      Medical Record Number: [id number]  Date of Birth: 01-Feb-1961 Sex: Male         Room/Bed: 4M07C/4M07C-01 Payor Info: Payor: MEDCOST / Plan: MEDCOST ULTRA / Product Type: *No Product type* /    Admit Date/Time:  08/27/2022  1:59 PM  Primary Diagnosis:  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-6 level [facility])  Hospital Problems: Principal Problem:   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-6 level [facility]) Active Problems:   Adjustment disorder with depressed mood    Expected Discharge Date: Expected Discharge Date: 09/16/22  Team Members Present: Physician leading conference: Dr. Genice Rouge Social Worker Present: Cecile Sheerer, LCSWA Nurse Present: Vedia Pereyra, RN PT Present: Bernie Covey, PT OT Present: Roney Mans, OT;Ardis Rowan, COTA PPS Coordinator present : Fae Pippin, SLP     Current Status/Progress Goal Weekly Team Focus  Bowel/Bladder   pt is continent to bowel and bladder, LBM 6/24   Patient to remain continent   Assess every shift and PRn    Swallow/Nutrition/ Hydration               ADL's   supervision overall   upgraded to Mod I overall   home safety, family educaiton, LUE GMC. Barriers: safety awareness and some impulsivity    Mobility               Communication                Safety/Cognition/ Behavioral Observations               Pain     7/10 neck pain  Less than 4 with PRN medications  Assess every 4 hours and PRN    Skin   incision to right night, edges aproximated   wound to show signs of healing, and free from drainage or signs of infection  assess every shift and prn      Discharge Planning:  Discharge remains as home with his wife. He will need to be intermittent superivison to Mod I at time of discharge as hiss wife works during the day, and the son and his girlfriend are only able to check in  throughout the day. Unsure on how often they can check in. Pt wife is prepared to ensure he has the care he needs at discharge. Fam edu scheduled for 6/25 1pm-4pm. DME- SPC ordered. Outpatient PT/OT at [facility]. SW will confirm there are no barriers to discharge.   Team Discussion: 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-6 level. Incision to neck is approximated without drainage. Trigger point injections done without relief. Increase Cymbalta. Continues to take Norco throughout the day. Family education today.  Patient on target to meet rehab goals: yes, patient meeting therapy goals and ready for discharge. 09/16/22  *See Care Plan and progress notes for long and short-term goals.   Revisions to Treatment Plan:  Medication adjustments, monitor labs/VS  Teaching Needs: Medications, self care, safety, transfer training, skin care, etc.   Current Barriers to Discharge: Decreased caregiver support  Possible Resolutions to Barriers: Family education, order recommended DME     Medical Summary Current Status: still having shoulder/neck- no improvement from TrP injections; increased cymbatla to help with pain; still taking NOrco 2-3x/day- pain 7/10- continent B/B-  Barriers to Discharge: Uncontrolled Pain  Barriers to Discharge Comments: going for oupt therapy- family ed this afternoon- getting SPC for walking  Possible Resolutions to [employer] Focus: d/c tomorrow- family ed will be done by then   Continued Need for Acute Rehabilitation Level of Care: The patient requires daily medical management by a physician with specialized training in physical medicine and rehabilitation for the following reasons: Direction of a multidisciplinary physical rehabilitation program to maximize functional independence : Yes Medical management of patient stability for increased activity during participation in an intensive rehabilitation regime.: Yes Analysis of laboratory values and/or radiology reports with  any subsequent need for medication adjustment and/or medical intervention. : Yes   I attest that I was present, lead the team conference, and concur with the assessment and plan of the team.   Jearld Adjutant 09/15/2022, 2:18 PM

## 2022-09-15 NOTE — Progress Notes (Signed)
PROGRESS NOTE   Subjective/Complaints:  Pt reports pain still an issue- TrP injections Pt reports shoulder pain the same-  Still taking Norco for pain- ~ 3-4x/day  Nursing perfume gave him a bad HA last night.    LBM yesterday ROS:    Pt denies SOB, abd pain, CP, N/V/C/D, and vision changes  Except for HPI  Objective:   No results found. Recent Labs    09/14/22 0606  WBC 6.0  HGB 13.9  HCT 38.3*  PLT 169     Recent Labs    09/14/22 0606  NA 132*  K 4.1  CL 98  CO2 26  GLUCOSE 96  BUN 15  CREATININE 0.85  CALCIUM 9.0      Intake/Output Summary (Last 24 hours) at 09/15/2022 0834 Last data filed at 09/15/2022 0756 Gross per 24 hour  Intake 537 ml  Output 600 ml  Net -63 ml         Physical Exam: Vital Signs Blood pressure (!) 142/74, pulse 61, temperature 97.7 F (36.5 C), temperature source Oral, resp. rate 16, height 6' (1.829 m), weight 73.9 kg, SpO2 100 %.    General: awake, alert, appropriate, sitting u on mat with OT in dayroom; walked there; NAD HENT: conjugate gaze; oropharynx moist-wearing soft collar- incision is now a scar CV: regular rate and rhythm; no JVD Pulmonary: CTA B/L; no W/R/R- good air movement GI: soft, NT, ND, (+)BS- normoactive Psychiatric: appropriate- flatter this AM, but sounds like due to HA Neurological: Ox3   PRIOR EXAMS: MS; tight in scalenes, upper traps, splenius capitus and levators and rhomboids B/L- somewhat reduced tightness, but still TTP  Skin: incision Clean and intact without signs of breakdown MS: RUE-  deltoid 4/5; Biceps 4/5; Triceps 4-/5; WE 4/5; Grip 2+/5 and FA 2-/5 LUE- Deltoid 4/5; biceps 4/5; Triceps 3+/5; WE 3+/5; grip 2+/5 and FA 2/5 MS; Shoulders still tight, but much better than before injections, but hasn't helped pain.   Neuro:  Alert and oriented x 3. Normal insight and awareness. Intact Memory. Normal language and speech.  Cranial nerve exam unremarkable. MMT: RUE 4/5 delt, biceps, 4- triceps, 3+WE, 3+/5 HI. LUE 3/5 deltoids, biceps, triceps and    3- to 3 WE and HI. BLE motor grossly 4/5 HF, KE and 4+ ADF/PF. Decreased LT in both arms. Senses pain and LT in both legs. No abnl resting tone. DTR's absent  Musculoskeletal: still with head forward position, shoulders rotated inward.  Can improve it with cueing. Traps generally tight. No focal TP's appreciated   Assessment/Plan: 1. Functional deficits which require 3+ hours per day of interdisciplinary therapy in a comprehensive inpatient rehab setting. Physiatrist is providing close team supervision and 24 hour management of active medical problems listed below. Physiatrist and rehab team continue to assess barriers to discharge/monitor patient progress toward functional and medical goals  Care Tool:  Bathing    Body parts bathed by patient: Left arm, Chest, Abdomen, Front perineal area, Buttocks, Right upper leg, Left upper leg, Right lower leg, Left lower leg, Face, Right arm   Body parts bathed by helper: Right arm     Bathing assist Assist Level: Independent with  assistive device     Upper Body Dressing/Undressing Upper body dressing   What is the patient wearing?: Pull over shirt, Orthosis    Upper body assist Assist Level: Independent    Lower Body Dressing/Undressing Lower body dressing      What is the patient wearing?: Pants, Underwear/pull up     Lower body assist Assist for lower body dressing: Independent with assitive device     Toileting Toileting    Toileting assist Assist for toileting: Independent with assistive device     Transfers Chair/bed transfer  Transfers assist     Chair/bed transfer assist level: Supervision/Verbal cueing     Locomotion Ambulation   Ambulation assist      Assist level: Supervision/Verbal cueing Assistive device: Cane-straight Max distance: 1000   Walk 10 feet activity   Assist      Assist level: Supervision/Verbal cueing Assistive device: Cane-straight   Walk 50 feet activity   Assist    Assist level: Supervision/Verbal cueing Assistive device: Cane-straight    Walk 150 feet activity   Assist Walk 150 feet activity did not occur: Safety/medical concerns  Assist level: Supervision/Verbal cueing Assistive device: Cane-straight    Walk 10 feet on uneven surface  activity   Assist Walk 10 feet on uneven surfaces activity did not occur: Safety/medical concerns         Wheelchair     Assist Is the patient using a wheelchair?: Yes Type of Wheelchair: Manual    Wheelchair assist level: Dependent - Patient 0% Max wheelchair distance: >150    Wheelchair 50 feet with 2 turns activity    Assist        Assist Level: Dependent - Patient 0%   Wheelchair 150 feet activity     Assist      Assist Level: Dependent - Patient 0%   Blood pressure (!) 142/74, pulse 61, temperature 97.7 F (36.5 C), temperature source Oral, resp. rate 16, height 6' (1.829 m), weight 73.9 kg, SpO2 100 %.  Medical Problem List and Plan: 1. 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 Patient is status post three-level anterior cervical decompression and fusion 5/30.              -patient may  shower with incision UNCOVERED             -ELOS/Goals: 09/16/22 Con't CIR PT and OT Team conference today to finalize d/c tomorrow 2.  Antithrombotics: -DVT/anticoagulation:   Lovenox 40mg  QD   6/20- walking well- will d/w PT how far walking to determine if needs Lovenox at d/c. I think yes, for 1 extra month, since is central cord syndrome 6/25- Will send home on Eliquis 2.5 mg BID x 1 month- since walking well- will change order today             -antiplatelet therapy: none   3. Pain Management: Tylenol, Norco 5 or 10, and Robaxin as needed - Valium 2 mg q6 hours prn for muscle  tightness of neck - lidoderm 2 patches 8pm to 8am -6/14  gabapentin 600mg  tid  -add kpad for shoulder girdles  -has menthol cream as well as above meds -discussed importance of improving his head and shoulder/neck posture in the short term and long term -6/15-16/24 pain well controlled, cont regimen, will discuss with weekday team if gabapentin is helping or if he wants to try increasing dose--objectively seems to be less bothered by the paresthesias, but still  reports them being present and bothersome -6/17- will add Cymbalta 30 mg at bedtime for nerve pain- will have pt have lidoderm put on neck and if doesn't work, might benefit from Trp injections.  -6/18- no side effects from Cymbalta- should take some time to help nerve pain -6/19- will do Trigger point injections in AM-educated what they do- no side effects from cymbalta -6/20- Trigger point injections today -6/21- hasn't felt much improvement from TrP injections so far- even though muscles feel looser- will increase Duloxetine to 60 mg at bedtime- and see if that helps.  -09/12/22 about the same, still monitoring for improvement, understands it will take time 6/24- pain about the same- but Cymbalta usually takes 10+ days to kick in- explained to pt. No response to Trp injections even though tightness has improved some 6/25- will stop Lidoderm patches- not helping 4. Mood/Behavior/Sleep: LCSW to evaluate and provide emotional support             -antipsychotic agents: n/a - melatonin 5mg  QHS; also has trazodone PRN -sleep improved, cont regimen   -6/22-23/24 sleeping well 5. Neuropsych/cognition: This patient is capable of making decisions on his own behalf.   6. Skin/Wound Care: Routine skin care checks             -monitor surgical incision   7. Fluids/Electrolytes/Nutrition: Routine Is and Os and follow-up chemistries -Na+ holding at 130 6/10, other labs look good    -6/17- Na 136 today  6/24- Na 132 8: Constipation due to  neurogenic bowel: currently Colace 100 mg BID, Miralax QD, dulcolax 10mg  suppository QD             -had results with sorbitol yesterday -6/12-stopped evening suppository as he's having urge/sensation to empty  -6/14 had bm last evening on his own.  -6/22-23/24 BMs seem regular, cont regimen 6/25- LBM yesterday- going well 9: History of chronic hepatitis C: completed treatment; follows with ID   10. Neurogenic bladder with urinary retention-   -timed voiding attempts every 6 hours. Scan patient after voids and have pt cathed if volumes >350cc-   - up to bsc, toilet, bladder massage and double voiding  -6/10 now voiding continently and with low PVR's (122)  -09/06/22 no ongoing retention  -6/18- no issues/resolved  6/24 will d/c PVRs/bladder scans 11. Post-op dysphagia---due to cervical surgery. Struggling with solids -can continue D3 diet for now  - cepacol and chloraseptic spray PRN  12. Elevated Bps HTN: -remotely on HCTZ 25mg  QD and Lisinopril 40mg  QD;  -6/9 HCTZ  restarted--added 10mg  lisinopril also 6/10 6/24- BP controlled on meds- con't regimen Vitals:   09/11/22 1951 09/12/22 0504 09/12/22 1329 09/12/22 2300  BP: 135/86 (!) 139/96 121/69 107/62   09/13/22 0508 09/13/22 1923 09/14/22 0545 09/14/22 0921  BP: 127/76 129/72 127/76 125/78   09/14/22 1029 09/14/22 1252 09/14/22 2012 09/15/22 0610  BP: 131/75 103/60 (!) 143/79 (!) 142/74     13. Tobacco use: no cravings per H&P, encourage cessation on discharge.    I spent a total of  36  minutes on total care today- >50% coordination of care- due to  Team conference today to finalize d/c tomorrow-   LOS: 19 days A FACE TO FACE EVALUATION WAS PERFORMED  Kenneth Flores 09/15/2022, 8:34 AM

## 2022-09-15 NOTE — Progress Notes (Signed)
Patient ID: Kenneth Flores, male   DOB: Jun 22, 1960, 62 y.o.   MRN: [id number]  SW met with pt and pt wife in room to provide updates from team conference on gains made, and d/c date remains tomorrow. SPC delivered to room. SW reviewed discharge.   *Family asks for handicap placard.   Cecile Sheerer, MSW, LCSWA Office: [phone] Cell: [phone] Fax: [phone]

## 2022-09-15 NOTE — Progress Notes (Signed)
Physical Therapy Discharge Summary  Patient Details  Name: Kenneth Flores MRN: [id number] Date of Birth: 12/20/1960  Date of Discharge from PT service:September 15, 2022  Today's Date: 09/15/2022 PT Individual Time: 1430-1530 PT Individual Time Calculation (min): 60 min    Patient has met 12 of 12 long term goals due to improved activity tolerance, improved balance, improved postural control, increased strength, improved awareness, and improved coordination.  Patient to discharge at an ambulatory level Modified Independent.   Patient's care partner is independent to provide the necessary physical assistance at discharge. Pt to d/c home with his wife, Kenneth Flores, who has undergone hands on family education. Pt mod I with mobility, supervision for stairs and in community environments.  Reasons goals not met: NA  Recommendation:  Patient will benefit from ongoing skilled PT services in outpatient setting to continue to advance safe functional mobility, address ongoing impairments in strength, balance, gait mechanics, and minimize fall risk.  Equipment: Single point cane  Reasons for discharge: treatment goals met and discharge from hospital  Patient/family agrees with progress made and goals achieved: Yes\  Skilled Therapeutic Interventions/Progress Updates:  Pt received in recliner and agreeable to therapy.  Session focused on family education with pt's wife. Pt reports continued neck/shoulder pain, unrated. Denies intervention. Pt performed both simulated and actual car transfer. Supervision required for taller vehicle with running board, but mod I at sedan height. Stair navigation with single handrail and close supervision x 12. Pt also navigated hospital and outdoor environment with supervision. Over level ground ambulation at mod I level overall. Also practiced fall transfer x2 with close supervision. Pt returned to room at end of session and remained in recliner with his wife present.   PT  Discharge Precautions/Restrictions Precautions Precautions: Fall;Cervical Precaution Comments: soft collar when OOB Required Braces or Orthoses: Cervical Brace Cervical Brace: Soft collar Restrictions Weight Bearing Restrictions: No Vital Signs Therapy Vitals Temp: 97.7 F (36.5 C) Temp Source: Oral Pulse Rate: 68 Resp: 16 BP: (!) 142/90 Patient Position (if appropriate): Sitting Oxygen Therapy SpO2: 100 % O2 Device: Room Air  Pain Interference Pain Interference Pain Effect on Sleep: 2. Occasionally Pain Interference with Therapy Activities: 1. Rarely or not at all Pain Interference with Day-to-Day Activities: 1. Rarely or not at all Vision/Perception  Vision - History Ability to See in Adequate Light: 0 Adequate Vision - Assessment Eye Alignment: Within Functional Limits Perception Perception: Within Functional Limits Praxis Praxis: Intact  Cognition Overall Cognitive Status: Within Functional Limits for tasks assessed Arousal/Alertness: Awake/alert Orientation Level: Oriented X4 Safety/Judgment: Appears intact Sensation Sensation Light Touch: Impaired Detail Peripheral sensation comments: Impaired BLE below knee, improved from baseline per pt report Proprioception: Appears Intact Stereognosis: Not tested Coordination Gross Motor Movements are Fluid and Coordinated: No Coordination and Movement Description: intermittently affected by ataxia, but functional at ambulatory level Motor  Motor Motor: Tetraplegia Motor - Skilled Clinical Observations: Lt LE deficits>Rt LE Motor - Discharge Observations: Greatly improved from baseline  Mobility Bed Mobility Rolling Right: Independent with assistive device Rolling Left: Independent with assistive device Supine to Sit: Independent with assistive device Sitting - Scoot to Edge of Bed: Independent with assistive device Sit to Supine: Independent with assistive device Transfers Transfers: Sit to Stand;Stand to  Sit;Stand Pivot Transfers Sit to Stand: Independent with assistive device Stand to Sit: Independent with assistive device Stand Pivot Transfers: Independent with assistive device Transfer (Assistive device): Straight cane Locomotion  Gait Ambulation: Yes Gait Assistance: Independent with assistive device Gait Distance (Feet): 1000 Feet  Assistive device: Straight cane Gait Gait: Yes Gait Pattern: Impaired Gait Pattern: Ataxic (mild ataxia noted at times when fatigued, L>R) Stairs / Additional Locomotion Stairs: Yes Stairs Assistance: Supervision/Verbal cueing Stair Management Technique: One rail Left Number of Stairs: 12 Height of Stairs: 6 Ramp: Supervision/Verbal cueing Curb: Supervision/Verbal cueing Pick up small object from the floor assist level: Supervision/Verbal cueing Pick up small object from the floor assistive device: cane Wheelchair Mobility Wheelchair Mobility: No  Trunk/Postural Assessment  Cervical Assessment Cervical Assessment: Exceptions to [facility] Thoracic Assessment Thoracic Assessment: Exceptions to [facility] Lumbar Assessment Lumbar Assessment: Within Functional Limits Postural Control Postural Control: Within Functional Limits  Balance Balance Balance Assessed: Yes Static Sitting Balance Static Sitting - Balance Support: Feet supported Static Sitting - Level of Assistance: 6: Modified independent (Device/Increase time) Dynamic Sitting Balance Dynamic Sitting - Balance Support: During functional activity Dynamic Sitting - Level of Assistance: 6: Modified independent (Device/Increase time) Static Standing Balance Static Standing - Balance Support: During functional activity Static Standing - Level of Assistance: 6: Modified independent (Device/Increase time) Dynamic Standing Balance Dynamic Standing - Balance Support: During functional activity Dynamic Standing - Level of Assistance: 6: Modified independent (Device/Increase time) Extremity Assessment       RLE Assessment RLE Assessment: Exceptions to [facility] RLE Strength Right Hip Flexion: 4/5 Right Knee Flexion: 4+/5 Right Knee Extension: 4+/5 Right Ankle Dorsiflexion: 4+/5 Right Ankle Plantar Flexion: 4+/5 RLE Tone RLE Tone: Within Functional Limits LLE Assessment LLE Assessment: Exceptions to [facility] LLE Strength Left Hip Flexion: 3+/5 Left Knee Flexion: 4-/5 Left Knee Extension: 4-/5 Left Ankle Dorsiflexion: 3+/5 Left Ankle Plantar Flexion: 3+/5 LLE Tone LLE Tone: Within Functional Limits   Jamis Kryder C Tedra Coppernoll 09/15/2022, 4:03 PM

## 2022-09-15 NOTE — Progress Notes (Signed)
Occupational Therapy Session Note  Patient Details  Name: Kenneth Flores MRN: [id number] Date of Birth: 06-Jun-1960  Today's Date: 09/15/2022 OT Individual Time: 1331-1415 OT Individual Time Calculation (min): 44 min    Short Term Goals: Week 3:  OT Short Term Goal 1 (Week 3): STG=LTG d/t ELOS  Skilled Therapeutic Interventions/Progress Updates:   Pt seen for final d/c session for skilled CIR OT. Family Education conducted throughout session with pt's wife. OT addressed education for pain mngt, ktape application, spinal precautions, falls prevention and safe and effective assisting/guarding with ADL's and mobility. Wife was able to demonstrate use of gait belt with pt amb from bed to demo tub room with mirror set up to home with TTB, suction grab bar and HH shower hose. Wife able to support pt safely in and out, off and on toilet, standing at sink side for hand washing. Back in room, OT training with all UE HEP with review of tband and handout, foam cube program, tputty, as well as light UE cane ex, breathing for relaxation and to reduce impulsivity and tension. Written UE HEP added to and wife gathered all for home in belonging bag. Left pt recliner level with no further questions reported and left with wife bedside with all safety needs.   Pain: 7/10 aching pain, added ktape to traps and scalenes for support    Therapy Documentation Precautions:  Precautions Precautions: Fall, Cervical Precaution Comments: soft collar when OOB Required Braces or Orthoses: Cervical Brace Cervical Brace: Soft collar Restrictions Weight Bearing Restrictions: No    Therapy/Group: Individual Therapy  Vicenta Dunning 09/15/2022, 7:54 AM

## 2022-09-15 NOTE — Progress Notes (Signed)
Inpatient Rehabilitation Discharge Medication Review by a Pharmacist  A complete drug regimen review was completed for this patient to identify any potential clinically significant medication issues.  High Risk Drug Classes Is patient taking? Indication by Medication  Antipsychotic {Receiving?:26196}   Anticoagulant {Receiving?:26196}   Antibiotic {Receiving?:26196}   Opioid {Receiving?:26196}   Antiplatelet {Receiving?:26196}   Hypoglycemics/insulin {Yes or No?:26198}   Vasoactive Medication {Receiving?:26196}   Chemotherapy {Receiving Chemo?:26197}   Other {Yes or No?:26198}      Type of Medication Issue Identified Description of Issue Recommendation(s)  Drug Interaction(s) (clinically significant)     Duplicate Therapy     Allergy     No Medication Administration End Date     Incorrect Dose     Additional Drug Therapy Needed     Significant med changes from prior encounter (inform family/care partners about these prior to discharge).  Communicate medication changes with patient/family at discharge  Other       Clinically significant medication issues were identified that warrant physician communication and completion of prescribed/recommended actions by midnight of the next day:  {Yes or No?:26198}  Name of provider notified for urgent issues identified: ***   Provider Method of Notification: ***    Pharmacist comments: ***   Time spent performing this drug regimen review (minutes): 20   Thank you for allowing pharmacy to be a part of this patient's care.  Gennett Garcia, PharmD Clinical Pharmacist  

## 2022-09-15 NOTE — Progress Notes (Signed)
Occupational Therapy Session Note  Patient Details  Name: Kenneth Flores MRN: [id number] Date of Birth: 1960/08/14  Today's Date: 09/15/2022 OT Individual Time: 1030-1100 OT Individual Time Calculation (min): 30 min    Short Term Goals: Week 3:  OT Short Term Goal 1 (Week 3): STG=LTG d/t ELOS  Skilled Therapeutic Interventions/Progress Updates:    OT intervetion with focus on functional amb with SPC and LUE strengthening and GMC control. All amb with SPC at mod I. LUE strengthening/function using [employer]. Pt with improved shoulder abduction and elbow extension. Pt able to grasp handle efficiently. Standing task at [facility] for tracing with LUE. Improved open chain GMC. Pt returned to room and remained in relciner. All needs wihtin reach.   Therapy Documentation Precautions:  Precautions Precautions: Fall, Cervical Precaution Comments: soft collar when OOB Required Braces or Orthoses: Cervical Brace Cervical Brace: Soft collar Restrictions Weight Bearing Restrictions: No  Pain: Pt reports ongoing upper traps pain; MD aware, repositioned and rest Therapy/Group: Individual Therapy  [name] 09/15/2022, 11:37 AM

## 2022-09-15 NOTE — Plan of Care (Signed)
  Problem: RH Tub/Shower Transfers Goal: LTG Patient will perform tub/shower transfers w/assist (OT) Description: LTG: Patient will perform tub/shower transfers with assist, with/without cues using equipment (OT) Outcome: Completed/Met   

## 2022-09-15 NOTE — Progress Notes (Signed)
Occupational Therapy Session Note  Patient Details  Name: Kenneth Flores MRN: [id number] Date of Birth: 1960-09-11  Today's Date: 09/15/2022 OT Individual Time: 0700-0810 OT Individual Time Calculation (min): 70 min    Short Term Goals: Week 3:  OT Short Term Goal 1 (Week 3): STG=LTG d/t ELOS  Skilled Therapeutic Interventions/Progress Updates:    OT intervention with focus on functional amb with SPC, standing balance, LUE FMC/GMC, safety awareness, and discharge planning. Pt amb with SPC to day room. Table tasks with cards, poker chips, and quarters to improve [facility] and in hand manipulation tasks. Balance tasks on [employer]. Pt with delayed weight shifts. Amb in unit with and without SPC. CGA without SPC. Reviewed home safety and HEP. Pt returned to room and remained seated in recliner. All needs within reach.   Therapy Documentation Precautions:  Precautions Precautions: Fall, Cervical Precaution Comments: soft collar when OOB Required Braces or Orthoses: Cervical Brace Cervical Brace: Soft collar Restrictions Weight Bearing Restrictions: No Pain:  Pt reports 7/10 upper traps pain; MD aware and repositioned   Therapy/Group: Individual Therapy  [name] 09/15/2022, 8:14 AM

## 2022-09-16 ENCOUNTER — Other Ambulatory Visit (HOSPITAL_COMMUNITY): Payer: Self-pay

## 2022-09-16 NOTE — Progress Notes (Signed)
PROGRESS NOTE   Subjective/Complaints:  LBM yesterday Things about the same- pain still 7/10 with meds- but taking Norco 10/325 - 2 tabs QID right now.    ROS:    Pt denies SOB, abd pain, CP, N/V/C/D, and vision changes  Except for HPI  Objective:   No results found. Recent Labs    09/14/22 0606  WBC 6.0  HGB 13.9  HCT 38.3*  PLT 169     Recent Labs    09/14/22 0606  NA 132*  K 4.1  CL 98  CO2 26  GLUCOSE 96  BUN 15  CREATININE 0.85  CALCIUM 9.0      Intake/Output Summary (Last 24 hours) at 09/16/2022 4540 Last data filed at 09/16/2022 0810 Gross per 24 hour  Intake 780 ml  Output --  Net 780 ml         Physical Exam: Vital Signs Blood pressure 127/77, pulse 68, temperature 97.9 F (36.6 C), temperature source Oral, resp. rate 16, height 6' (1.829 m), weight 73.9 kg, SpO2 100 %.     General: awake, alert, appropriate, sitting up eating breakfast EOB; NAD HENT: conjugate gaze; oropharynx moist CV: regular rate and rhythm; no JVD Pulmonary: CTA B/L; no W/R/R- good air movement GI: soft, NT, ND, (+)BS Psychiatric: appropriate- flatter affect this AM Neurological: Ox3    PRIOR EXAMS: MS; tight in scalenes, upper traps, splenius capitus and levators and rhomboids B/L- somewhat reduced tightness, but still TTP  Skin: incision Clean and intact without signs of breakdown MS: RUE-  deltoid 4/5; Biceps 4/5; Triceps 4-/5; WE 4/5; Grip 2+/5 and FA 2-/5 LUE- Deltoid 4/5; biceps 4/5; Triceps 3+/5; WE 3+/5; grip 2+/5 and FA 2/5 MS; Shoulders still tight, but much better than before injections, but hasn't helped pain.   Neuro:  Alert and oriented x 3. Normal insight and awareness. Intact Memory. Normal language and speech. Cranial nerve exam unremarkable. MMT: RUE 4/5 delt, biceps, 4- triceps, 3+WE, 3+/5 HI. LUE 3/5 deltoids, biceps, triceps and    3- to 3 WE and HI. BLE motor grossly 4/5 HF, KE and  4+ ADF/PF. Decreased LT in both arms. Senses pain and LT in both legs. No abnl resting tone. DTR's absent  Musculoskeletal: still with head forward position, shoulders rotated inward.  Can improve it with cueing. Traps generally tight. No focal TP's appreciated   Assessment/Plan: 1. Functional deficits which require 3+ hours per day of interdisciplinary therapy in a comprehensive inpatient rehab setting. Physiatrist is providing close team supervision and 24 hour management of active medical problems listed below. Physiatrist and rehab team continue to assess barriers to discharge/monitor patient progress toward functional and medical goals  Care Tool:  Bathing    Body parts bathed by patient: Left arm, Chest, Abdomen, Front perineal area, Buttocks, Right upper leg, Left upper leg, Right lower leg, Left lower leg, Face, Right arm   Body parts bathed by helper: Right arm     Bathing assist Assist Level: Independent with assistive device     Upper Body Dressing/Undressing Upper body dressing   What is the patient wearing?: Pull over shirt, Orthosis    Upper body assist Assist Level:  Independent    Lower Body Dressing/Undressing Lower body dressing      What is the patient wearing?: Pants, Underwear/pull up     Lower body assist Assist for lower body dressing: Independent with assitive device     Toileting Toileting    Toileting assist Assist for toileting: Independent with assistive device     Transfers Chair/bed transfer  Transfers assist     Chair/bed transfer assist level: Independent with assistive device Chair/bed transfer assistive device: Theatre manager   Ambulation assist      Assist level: Independent with assistive device Assistive device: Cane-straight Max distance: 1000   Walk 10 feet activity   Assist     Assist level: Independent with assistive device Assistive device: Cane-straight   Walk 50 feet activity   Assist     Assist level: Independent with assistive device Assistive device: Cane-straight    Walk 150 feet activity   Assist Walk 150 feet activity did not occur: Safety/medical concerns  Assist level: Independent with assistive device Assistive device: Cane-straight    Walk 10 feet on uneven surface  activity   Assist Walk 10 feet on uneven surfaces activity did not occur: Safety/medical concerns   Assist level: Supervision/Verbal cueing Assistive device: Cane-straight   Wheelchair     Assist Is the patient using a wheelchair?: No Type of Wheelchair: Manual Wheelchair activity did not occur: N/A  Wheelchair assist level: Dependent - Patient 0% Max wheelchair distance: >150    Wheelchair 50 feet with 2 turns activity    Assist    Wheelchair 50 feet with 2 turns activity did not occur: N/A   Assist Level: Dependent - Patient 0%   Wheelchair 150 feet activity     Assist  Wheelchair 150 feet activity did not occur: N/A   Assist Level: Dependent - Patient 0%   Blood pressure 127/77, pulse 68, temperature 97.9 F (36.6 C), temperature source Oral, resp. rate 16, height 6' (1.829 m), weight 73.9 kg, SpO2 100 %.  Medical Problem List and Plan: 1. 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 Patient is status post three-level anterior cervical decompression and fusion 5/30.              -patient may  shower with incision UNCOVERED             -ELOS/Goals: 09/16/22 D/c today- will go home on Eliquis to prevent blood clots x 1 more month and have to call my clinic to get refill of pain meds in 5 days- don't wait til last day to call.  2.  Antithrombotics: -DVT/anticoagulation:   Lovenox 40mg  QD   6/20- walking well- will d/w PT how far walking to determine if needs Lovenox at d/c. I think yes, for 1 extra month, since is central cord syndrome 6/25- Will send home on Eliquis 2.5 mg BID x 1  month- since walking well- will change order today             -antiplatelet therapy: none   3. Pain Management: Tylenol, Norco 5 or 10, and Robaxin as needed - Valium 2 mg q6 hours prn for muscle tightness of neck - lidoderm 2 patches 8pm to 8am -6/14  gabapentin 600mg  tid  -add kpad for shoulder girdles  -has menthol cream as well as above meds -discussed importance of improving his head and shoulder/neck posture in the short term and long term -6/15-16/24 pain well controlled,  cont regimen, will discuss with weekday team if gabapentin is helping or if he wants to try increasing dose--objectively seems to be less bothered by the paresthesias, but still reports them being present and bothersome -6/17- will add Cymbalta 30 mg at bedtime for nerve pain- will have pt have lidoderm put on neck and if doesn't work, might benefit from Trp injections.  -6/18- no side effects from Cymbalta- should take some time to help nerve pain -6/19- will do Trigger point injections in AM-educated what they do- no side effects from cymbalta -6/20- Trigger point injections today -6/21- hasn't felt much improvement from TrP injections so far- even though muscles feel looser- will increase Duloxetine to 60 mg at bedtime- and see if that helps.  -09/12/22 about the same, still monitoring for improvement, understands it will take time 6/24- pain about the same- but Cymbalta usually takes 10+ days to kick in- explained to pt. No response to Trp injections even though tightness has improved some 6/25- will stop Lidoderm patches- not helping 6/26- will send home on 6 pills of Norco 10/325 mg/day- not 8 like has been taking- esp because amount of tylenol and so much opiate- educated pt on this.  4. Mood/Behavior/Sleep: LCSW to evaluate and provide emotional support             -antipsychotic agents: n/a - melatonin 5mg  QHS; also has trazodone PRN -sleep improved, cont regimen   -6/22-23/24 sleeping well 5.  Neuropsych/cognition: This patient is capable of making decisions on his own behalf.   6. Skin/Wound Care: Routine skin care checks             -monitor surgical incision   7. Fluids/Electrolytes/Nutrition: Routine Is and Os and follow-up chemistries -Na+ holding at 130 6/10, other labs look good    -6/17- Na 136 today  6/24- Na 132 8: Constipation due to neurogenic bowel: currently Colace 100 mg BID, Miralax QD, dulcolax 10mg  suppository QD             -had results with sorbitol yesterday -6/12-stopped evening suppository as he's having urge/sensation to empty  -6/14 had bm last evening on his own.  6/26- going daily with Miralax- needs to continue it at d/c.  9: History of chronic hepatitis C: completed treatment; follows with ID   10. Neurogenic bladder with urinary retention-   -timed voiding attempts every 6 hours. Scan patient after voids and have pt cathed if volumes >350cc-   - up to bsc, toilet, bladder massage and double voiding  -6/10 now voiding continently and with low PVR's (122)  -09/06/22 no ongoing retention  -6/18- no issues/resolved  6/24 will d/c PVRs/bladder scans 11. Post-op dysphagia---due to cervical surgery. Struggling with solids -can continue D3 diet for now  - cepacol and chloraseptic spray PRN  12. Elevated Bps HTN: -remotely on HCTZ 25mg  QD and Lisinopril 40mg  QD;  -6/9 HCTZ  restarted--added 10mg  lisinopril also 6/10 6/24- BP controlled on meds- con't regimen 6/26- BP very slightly elevated 142/74- will con't regimen Vitals:   09/12/22 2300 09/13/22 0508 09/13/22 1923 09/14/22 0545  BP: 107/62 127/76 129/72 127/76   09/14/22 0921 09/14/22 1029 09/14/22 1252 09/14/22 2012  BP: 125/78 131/75 103/60 (!) 143/79   09/15/22 0610 09/15/22 1314 09/15/22 1316 09/15/22 1924  BP: (!) 142/74 (!) 142/90 (!) 142/90 127/77     13. Tobacco use: no cravings per H&P, encourage cessation on discharge.    I spent a total of 34   minutes on total  care today- >50%  coordination of care- due to   D/w pt about d/c plans- pain meds- gets 7 days and then will have to call clinic to get refills- explained can only get 6 pills/day when goes home.   LOS: 20 days A FACE TO FACE EVALUATION WAS PERFORMED  Valyn Latchford 09/16/2022, 8:12 AM

## 2022-09-16 NOTE — Progress Notes (Signed)
Inpatient Rehabilitation Care Coordinator Discharge Note   Patient Details  Name: Kenneth Flores MRN: [id number] Date of Birth: 10-04-1960   Discharge location: D/c to home  Length of Stay: 19 days  Discharge activity level: Mod I  Home/community participation: Limited  Patient response WU:[id number] Literacy - How often do you need to have someone help you when you read instructions, pamphlets, or other written material from your doctor or pharmacy?: Never  Patient response YN:WGNFAO Isolation - How often do you feel lonely or isolated from those around you?: Never  Services provided included: MD, RD, PT, OT, RN, CM, TR, Pharmacy, SW, Neuropsych  Financial Services:  Field seismologist Utilized: Engineering geologist  Choices offered to/list presented to: patient and pt wife  Follow-up services arranged:  Outpatient, DME    Outpatient Servicies: [facility] for PT/OT DME : [facility] for [facility] ([facility])    Patient response to transportation need: Is the patient able to respond to transportation needs?: Yes In the past 12 months, has lack of transportation kept you from medical appointments or from getting medications?: No In the past 12 months, has lack of transportation kept you from meetings, work, or from getting things needed for daily living?: No   Patient/Family verbalized understanding of follow-up arrangements:  Yes  Individual responsible for coordination of the follow-up plan: contact pt [phone] or pt wife [phone]  Confirmed correct DME delivered: Gretchen Short 09/16/2022    Comments (or additional information):fam edu completed. Mailed handicap placard.   Summary of Stay    Date/Time Discharge Planning CSW  09/14/22 1347 Discharge remains as home with his wife. He will need to be intermittent superivison to Mod I at time of discharge as hiss wife works during the day, and the son and his girlfriend are only able to check in throughout  the day. Unsure on how often they can check in. Pt wife is prepared to ensure he has the care he needs at discharge. Fam edu scheduled for 6/25 1pm-4pm. DME- SPC ordered. Outpatient PT/OT at [facility]. SW will confirm there are no barriers to discharge. AAC  09/07/22 1343 Discharge remains to home with his wife. He will need to be intermittent superivison to Mod I at time of discharge as hiss wife works during the day, and the son and his girlfriend are only able to check in throughout the day. Unsure on how often they can check in. Pt wife is prepared to ensure he has the care he needs at discharge. Fam edu scheduled for 6/25 1pm-4pm.  SW will confirm there are no barriers to discharge. AAC  08/31/22 0929 Pt will discharge to home. He will need to be intermittent superivison to Mod I at time of discharge as hiss wife works during the day, and the son and his girlfriend are only able to check in throughout the day. Unsure on how often they can check in. Pt wife is prepared to ensure he has the care he needs at discharge. SW will confirm there are no barriers to discharge. AAC       Maury Groninger A Lula Olszewski

## 2022-09-16 NOTE — Progress Notes (Signed)
Recreational Therapy Discharge Summary Patient Details  Name: Kenneth Flores MRN: [id number] Date of Birth: 09/01/1960 Today's Date: 09/16/2022   Comments on progress toward goals: Pt has made great progress during LOS and is discharging home today with wife to provide the needed supervision/assistance.  TR sessions focused on pt education in regards to leisure education including activity analysis and identification of modifications, coping/stress management and community reintegration.  Pt participated in community reintegration tasks ambulatory level throughout the hospital and outside on the hospital ground using [facility] with supervision.  Community reintegration education also included self advocacy, energy conservation techniques, and safe obstacle negotiation.  Pt is excited to return home and to previously enjoyed activities as able. Reasons for discharge: discharge from hospital  Follow-up: Outpatient  Patient/family agrees with progress made and goals achieved: Yes  Kenneth Flores 09/16/2022, 8:56 AM

## 2022-09-21 ENCOUNTER — Telehealth: Payer: Self-pay | Admitting: *Deleted

## 2022-09-21 NOTE — Telephone Encounter (Signed)
Patient requesting a refill on HydroCodone at [facility]T hosp f/u 7/26.

## 2022-09-22 ENCOUNTER — Other Ambulatory Visit (HOSPITAL_COMMUNITY): Payer: Self-pay

## 2022-09-22 MED ORDER — HYDROCODONE-ACETAMINOPHEN 10-325 MG PO TABS: 1.0000 | ORAL_TABLET | ORAL | 0 refills | Status: DC | PRN

## 2022-09-22 NOTE — Telephone Encounter (Signed)
Patient wife has been notified.

## 2022-09-22 NOTE — Telephone Encounter (Signed)
Calling again for med refill on  hydrocodone. (You were off call yesterday). Had surgery and is out.

## 2022-09-23 ENCOUNTER — Telehealth: Payer: Self-pay | Admitting: *Deleted

## 2022-09-23 ENCOUNTER — Other Ambulatory Visit (HOSPITAL_COMMUNITY): Payer: Self-pay

## 2022-09-23 MED ORDER — HYDROCODONE-ACETAMINOPHEN 10-325 MG PO TABS: 1.0000 | ORAL_TABLET | Freq: Four times a day (QID) | ORAL | 0 refills | Status: DC | PRN

## 2022-09-23 NOTE — Telephone Encounter (Signed)
Insurance will only cover 7 day rx of Hydrocodone on 1st fill. Patient instructed to pick up 7 day. Please send updated rx so that PA can be done. Thanks

## 2022-09-24 NOTE — Therapy (Signed)
OUTPATIENT OCCUPATIONAL THERAPY NEURO EVALUATION  Patient Name: Kenneth Flores MRN: [id number] DOB:01/12/1961, 62 y.o., male Today's Date: 09/25/2022  PCP: Norm Salt, [place] REFERRING PROVIDER: Milinda Antis, PA-C  END OF SESSION:  OT End of Session - 09/25/22 1319     Visit Number 1    Number of Visits 16   + Evaluation   Date for OT Re-Evaluation 11/27/22    Authorization Type [employer] [id number] VL: 60 (PT/OT/ST)    OT Start Time 1320    OT Stop Time 1410    OT Time Calculation (min) 50 min    Equipment Utilized During Treatment Testing Material    Activity Tolerance Patient tolerated treatment well    Behavior During Therapy WFL for tasks assessed/performed             Past Medical History:  Diagnosis Date   Hepatitis C    Past Surgical History:  Procedure Laterality Date   ANTERIOR CERVICAL DECOMP/DISCECTOMY FUSION N/A 08/21/2022   Procedure: ANTERIOR CERVICAL DECOMPRESSION/DISCECTOMY FUSION CERVICAL THREE-FOUR, CERVICAL FOUR-FIVE, CERVICAL FIVE-SIX;  Surgeon: Julio Sicks, MD;  Location: [facility] OR;  Service: Neurosurgery;  Laterality: N/A;   PELVIC FRACTURE SURGERY Right    Patient Active Problem List   Diagnosis Date Noted   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) 09/09/2022   Adjustment disorder with depressed mood 08/31/2022   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) 08/27/2022   Cervical spinal cord injury (HCC) 08/19/2022   Chronic hepatitis C without hepatic coma (HCC) 09/08/2021   Thrombocytopenia (HCC) 09/08/2021    ONSET DATE: Referral: 09/17/2022 Hospitalized 08/19/22  REFERRING DIAG: G82.54 (ICD-10-CM) - Quadriplegia, C5-C7 incomplete  THERAPY DIAG:  Other lack of coordination  Muscle weakness (generalized)  Pain in left arm  Other muscle spasm  Unsteadiness on feet  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)  Rationale for Evaluation and Treatment: Rehabilitation  SUBJECTIVE:   SUBJECTIVE STATEMENT:  Patient reports he has  3 different colored puttys (yellow, red & green), 2 therabands (yellow & green), 2 sponges (yellow and green) from the hospital therapists.  Patient and spouse report that they just want him to get better and back to the most independence he can achieve.  Pt accompanied by: self and significant other [name]   PERTINENT HISTORY:   Hx of chronic Hep C, status post micturition related syncope from vasovagal episode at home on 08/19/2022 resulting in fall with blow to head. Imaging revealed evidence of significant multilevel cervical degenerative disease with associated disc space collapse and associated spondylosis. Patient with critical spinal stenosis with severe cord compression and cord signal change at C3-4. Severe stenosis at C4-5 and C5-6 without marked cord signal abnormality at these levels. There is some changes posteriorly from C3-C6 with some worrisome findings for some posterior ligamentous injury. There was no evidence of fracture on his CT scan. There is no evidence of malalignment. Neurosurgery consulted and admitted the patient for IV steroids and pain control. On 5/30, he was taken to the OR and underwent C3-4, C4-5, C5-6 anterior cervical discectomy with interbody fusion utilizing interbody cages, local harvested autograft, and anterior plate instrumentation. Motor function improved.   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 Yes: NPRS scale: 7/10 Pain  location: L arm, shoulder and across shoulder/mid-back Pain description: throbbing, vibrating Aggravating factors: Overworking it, doing too much Relieving factors: rest/relaxing, oral medication 4x/day, heating pad sometimes   FALLS: Has patient fallen in last 6 months? Yes. Number of falls 2 related to same issue - the first fall was a couple of weeks prior to the accident where he hit is head due to a 2nd fall - when he hit his head on the sink  LIVING ENVIRONMENT: Lives with: lives with their spouse Lives in: House/apartment Stairs: Yes: Internal: 14 steps; on right going up, on left going up, and only half way up on the R side; wife walks with him upstairs to the bedrooms Has following equipment at home: Single point cane, Shower bench, and Grab bars  PLOF: Independent, driving, worked as Administrator (x40 hours/week)  PATIENT GOALS: Get my L arm better, tie shoelaces tighter so they stay tied  OBJECTIVE:   HAND DOMINANCE: Right  ADLs: Overall ADLs: Min to mod assist Transfers/ambulation related to ADLs: SBA with cane Eating: Needs help to cut his food Grooming: He tries to shave himself but gets help for the things he can't reach/get to on his own. UB Dressing: He can dress himself with simple clothes but needs extra time but needs help with fasteners LB Dressing: Needs help to get TED hose on and tie shoelaces Toileting: MI but wife is still monitoring him Bathing: Wife assists with his back and is present during showers Tub Shower transfers: Tub transfer bench - wife is there for extra support due to him being wobbly and has been trying to step into the tub with grab bars rather than sitting on the bench all the time Equipment: Transfer tub bench and Grab bars  IADLs: Shopping: Wife is performing shopping. Light housekeeping: Dependent on family ie) Not able to safely or effectively help take out the trash, mow the lawn etc. Meal Prep: Wife performs this.  He did help with  this a little previously. Community mobility: Uses cane with supervision Medication management: No meds previously but now 4x/day.  Using pill box and patient did sort them himself the last time but his wife is checking to make sure they are accurate and calls him to remind him to take his medication as needed (ie if she is working Catering manager). Financial management: Wife is helping more than she needed to previously Handwriting: 50% legible - Patient reports his current writing is not as good as before (about 50% compared to before) but it has improved significantly from when he had to mark/sign with an "X" only.    Patient is never home alone (if his wife is at work, his stepson will be there).  He stays on the bottom level of the home during the daytime and is off the narcotic in the daytime.  He uses his cane in the house  MOBILITY STATUS: Needs Assist: supervision for safety  POSTURE COMMENTS:  forward head Sitting balance: WFL  ACTIVITY TOLERANCE: Activity tolerance: Decreased.  Patient reports falling asleep/napping daily at this time (combo of medication etc)  FUNCTIONAL OUTCOME MEASURES: Quick Dash: 88.6  UPPER EXTREMITY ROM:    Active ROM Right eval Left eval  Shoulder flexion 100 90  Shoulder abduction 90 75  Shoulder adduction  Shoulder extension Limited end range   Shoulder internal rotation   Shoulder external rotation    Elbow flexion Limited end range  Elbow extension Decreased end range (L side spasmed with stretch  Wrist flexion    Wrist extension    Wrist ulnar deviation    Wrist radial deviation    Wrist pronation    Wrist supination    Digital extension Decreased end range and slow to open   (Blank rows = not formally tested at eval)  UPPER EXTREMITY MMT:     MMT Right eval Left eval  Shoulder flexion 3- 3-  Shoulder abduction    Shoulder adduction    Shoulder extension    Shoulder internal rotation    Shoulder external rotation    Middle  trapezius    Lower trapezius    Elbow flexion 3 3-  Elbow extension    Wrist flexion    Wrist extension    Wrist ulnar deviation    Wrist radial deviation    Wrist pronation    Wrist supination    (Blank rows = not formally tested at eval)  HAND FUNCTION: Grip strength: Right: 21.1, 18.2, 20.0 lbs; Left: 8.5, 5.7, 7.4  lbs Evaluation: Right Avg 19.8 lbs Left Avg 7.2 lbs  COORDINATION: 9 Hole Peg test: Right: 45.22  sec; Left: 1:40.28  sec Box and Blocks:  Right 41 blocks, Left 31 blocks  SENSATION: Not tested  EDEMA: None noted in UEs  MUSCLE TONE: Patient has some stiffness with ROM  COGNITION: Overall cognitive status: Within functional limits for tasks assessed BIMS from hospital (08/2022) 15/15  VISION: Subjective report: No changes since accident Baseline vision: Bifocals Visual history: NA  VISION ASSESSMENT: Not tested  Patient has no changes or difficulty with any activities due to vision.  PERCEPTION: Not tested  PRAXIS: Not tested  OBSERVATIONS: Patient seen following physical therapy services this afternoon.  He arrived ambulating with a single-point cane.  Patient is accompanied by his wife.  Patient is soft-spoken and noted to refer to his wife for answers throughout session.  He has noted to have limitations in bilateral upper extremity range of motion left greater than right with limited success with overhead reaching and extended time for fine motor tasks with both sides but particularly left upper extremity.   TODAY'S TREATMENT:                                                                                                                              DATE: NA   PATIENT EDUCATION: Education details: OT POC and treatment planning/goal ideas Person educated: Patient and Spouse Education method: Explanation and Verbal cues Education comprehension: verbalized understanding and needs further education  HOME EXERCISE PROGRAM: TBD   GOALS: Goals  reviewed with patient? Yes - Will review specific goals in future sessions.  SHORT TERM GOALS: Target date: 10/30/22  Patient will improve overhead reaching > 100 degrees for B shoulders to  assist with ADLs. Baseline: R 100* L 90* Goal status: INITIAL  2.  Patient will demonstrate at least 30 lbs RUE and 15 lbs LUE grip strength as needed to open containers and carry lightweight objects without dropping them.  Baseline: Right Avg 19.8 lbs Left Avg 7.2 lbs Goal status: INITIAL  3.  Patient will complete nine-hole peg with use of RUE in 35 seconds or less and LUE 75 seconds or less.  Baseline: 9 Hole Peg test: Right: 45.22  sec; Left: 1:40.28  sec Goal status: INITIAL  4.  Patient will demonstrate UE strengthening HEP (putty/theraband etc) with 25% verbal cues or less for proper execution. Baseline: Has various materials from hospital. Goal status: INITIAL  5.  Patient will demonstrate good digital A/AROM HEP to minimize stiffness in hands (esp L) which impacts coordination ie) in hand manipulation of small objects. Baseline: Stiff digital extension with decreased end range and difficulty with managing pegs for 9 hole peg test ie) dropped items etc.  Goal status: INITIAL   LONG TERM GOALS: Target date: 11/27/22  Patient will demonstrate UE strength and coordination to cut various foods using appropriate AE if necessary. Baseline: Unable to cut food/Assisted by family Goal status: INITIAL  2.  Patient will demonstrate UE pinch strength and coordination to tie shoelaces tightly. Baseline: Shoelaces frequently unfasten Goal status: INITIAL  3.  Patient will demonstrate at least 40 lbs RUE and 20 lbs LUE grip strength as needed to open containers and carry lightweight objects.  Baseline: Right Avg 19.8 lbs Left Avg 7.2 lbs Goal status: INITIAL  4.  Patient will complete nine-hole peg with use of RUE in 35 seconds or less and LUE 75 seconds or less.  Baseline: 9 Hole Peg test: Right:  45.22  sec; Left: 1:40.28  sec Goal status: INITIAL  5.  Patient will be able to complete simple food prep activities with good activity tolerance > 15 minutes and safety (ie. Decreased fall risk and implementation of safety precautions). Baseline: Family does all meal prep Goal status: INITIAL  6. Patient will demonstrate at least 20% improvement with quick Dash score (reporting <68 % disability or less) indicating improved functional use of affected extremity.   Baseline: 88.6% (10/11 categories as Severe/Unable to perform.  Goal Status: INITIAL   ASSESSMENT:  CLINICAL IMPRESSION: Patient is a 62 y.o. male who was seen today for occupational therapy evaluation for incomplete quadriplegia. Hx includes fall due to vasovagal . Patient currently presents significantly below baseline level of functioning demonstrating functional deficits and impairments as noted below. Pt would benefit from skilled OT services in the outpatient setting to work on impairments as noted below to help pt return to PLOF as able.     PERFORMANCE DEFICITS: in functional skills including ADLs, IADLs, coordination, dexterity, proprioception, sensation, tone, ROM, strength, pain, muscle spasms, flexibility, Fine motor control, Gross motor control, mobility, balance, endurance, continence, decreased knowledge of use of DME, and UE functional use, cognitive skills including energy/drive, safety awareness, and thought, and psychosocial skills including coping strategies and routines and behaviors.   IMPAIRMENTS: are limiting patient from ADLs, IADLs, rest and sleep, work, leisure, and social participation.   CO-MORBIDITIES: may have co-morbidities  that affects occupational performance. Patient will benefit from skilled OT to address above impairments and improve overall function.  MODIFICATION OR ASSISTANCE TO COMPLETE EVALUATION: No modification of tasks or assist necessary to complete an evaluation.  OT OCCUPATIONAL  PROFILE AND HISTORY: Problem focused assessment: Including review of records  relating to presenting problem.  CLINICAL DECISION MAKING: LOW - limited treatment options, no task modification necessary  REHAB POTENTIAL: Good  EVALUATION COMPLEXITY: Low    PLAN:  OT FREQUENCY: 2x/week  OT DURATION: 8 weeks  PLANNED INTERVENTIONS: self care/ADL training, therapeutic exercise, therapeutic activity, neuromuscular re-education, manual therapy, balance training, functional mobility training, splinting, fluidotherapy, patient/family education, energy conservation, coping strategies training, and DME and/or AE instructions  RECOMMENDED OTHER SERVICES: Patient had outpt PT evaluation today also.  CONSULTED AND AGREED WITH PLAN OF CARE: Patient and family member/caregiver  PLAN FOR NEXT SESSION:   Review HEPs from hospital for putty (pinch to improve grasp for tying laces), theraband and sponges for strengthening.  Introduce UE ROM exercises ie) table top slides.  Begin Coordination HEP activities.   Victorino Sparrow, OT 09/25/2022, 3:56 PM

## 2022-09-25 ENCOUNTER — Ambulatory Visit: Payer: PRIVATE HEALTH INSURANCE | Attending: Physician Assistant | Admitting: Physical Therapy

## 2022-09-25 ENCOUNTER — Other Ambulatory Visit: Payer: Self-pay

## 2022-09-25 ENCOUNTER — Encounter: Payer: Self-pay | Admitting: Physical Therapy

## 2022-09-25 ENCOUNTER — Encounter: Payer: Self-pay | Admitting: Occupational Therapy

## 2022-09-25 ENCOUNTER — Ambulatory Visit: Payer: PRIVATE HEALTH INSURANCE | Admitting: Occupational Therapy

## 2022-09-25 VITALS — BP 117/75 | HR 70

## 2022-09-25 DIAGNOSIS — M79602 Pain in left arm: Secondary | ICD-10-CM | POA: Diagnosis present

## 2022-09-25 DIAGNOSIS — R278 Other lack of coordination: Secondary | ICD-10-CM

## 2022-09-25 DIAGNOSIS — M62838 Other muscle spasm: Secondary | ICD-10-CM | POA: Insufficient documentation

## 2022-09-25 DIAGNOSIS — R2681 Unsteadiness on feet: Secondary | ICD-10-CM | POA: Insufficient documentation

## 2022-09-25 DIAGNOSIS — R29898 Other symptoms and signs involving the musculoskeletal system: Secondary | ICD-10-CM | POA: Diagnosis present

## 2022-09-25 DIAGNOSIS — M6281 Muscle weakness (generalized): Secondary | ICD-10-CM | POA: Diagnosis present

## 2022-09-25 DIAGNOSIS — G8254 Quadriplegia, C5-C7 incomplete: Secondary | ICD-10-CM

## 2022-09-25 DIAGNOSIS — R2689 Other abnormalities of gait and mobility: Secondary | ICD-10-CM | POA: Diagnosis present

## 2022-09-25 NOTE — Therapy (Unsigned)
OUTPATIENT PHYSICAL THERAPY NEURO EVALUATION   Patient Name: Kenneth Flores MRN: [id number] DOB:November 21, 1960, 62 y.o., male Today's Date: 09/25/2022   PCP: Norm Salt, [place] REFERRING PROVIDER: Milinda Antis, PA-C  END OF SESSION:  PT End of Session - 09/25/22 1245     Visit Number 1    Authorization Type [employer] ULTRA    PT Start Time 1235    PT Stop Time 1323    PT Time Calculation (min) 48 min    Equipment Utilized During Treatment Gait belt    Activity Tolerance --    Behavior During Therapy WFL for tasks assessed/performed             Past Medical History:  Diagnosis Date   Hepatitis C    Past Surgical History:  Procedure Laterality Date   ANTERIOR CERVICAL DECOMP/DISCECTOMY FUSION N/A 08/21/2022   Procedure: ANTERIOR CERVICAL DECOMPRESSION/DISCECTOMY FUSION CERVICAL THREE-FOUR, CERVICAL FOUR-FIVE, CERVICAL FIVE-SIX;  Surgeon: Julio Sicks, MD;  Location: [facility] OR;  Service: Neurosurgery;  Laterality: N/A;   PELVIC FRACTURE SURGERY Right    Patient Active Problem List   Diagnosis Date Noted   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) 09/09/2022   Adjustment disorder with depressed mood 08/31/2022   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) 08/27/2022   Cervical spinal cord injury (HCC) 08/19/2022   Chronic hepatitis C without hepatic coma (HCC) 09/08/2021   Thrombocytopenia (HCC) 09/08/2021    ONSET DATE: 08/20/2022 (surgery date)  REFERRING DIAG: 09/16/2022  THERAPY DIAG:  Unsteadiness on feet  Muscle weakness (generalized)  Other abnormalities of gait and mobility  Pain in left arm  Other muscle spasm  Rationale for Evaluation and Treatment: Rehabilitation  SUBJECTIVE:                                                                                                                                                                                             SUBJECTIVE STATEMENT: "My walking, exercising of this left arm, tying my shoes,  getting dressed are hard for him." -wife Pt accompanied by: significant other-[name]  PERTINENT HISTORY: Chronic Hep C  From rehab D/C note:  "micturition related syncope from vasovagal episode at home on 08/19/2022 resulting in fall with blow to head. Imaging revealed evidence of significant multilevel cervical degenerative disease with associated disc space collapse and associated spondylosis. Patient with critical spinal stenosis with severe cord compression and cord signal change at C3-4. Severe stenosis at C4-5 and C5-6 without marked cord signal abnormality at these levels."   "On 5/30, he was taken to the OR and underwent C3-4, C4-5, C5-6 anterior cervical discectomy with  interbody fusion utilizing interbody cages, local harvested autograft, and anterior plate instrumentation by Dr. Jordan Likes. Motor function improved."  PAIN:  Are you having pain? Yes: NPRS scale: 7/10 Pain location: shoulders and left arm Pain description: numb Aggravating factors: random, overworking the arm Relieving factors: pain medicines  PRECAUTIONS: Cervical and Fall-per pt and wife he only has to wear collar when he is in therapy and walking around (PT to confirm with surgeon)  WEIGHT BEARING RESTRICTIONS: No  FALLS: Has patient fallen in last 6 months? Yes. Number of falls 1-fall that caused surgical need  LIVING ENVIRONMENT: Lives with: lives with their spouse Lives in: House/apartment Stairs: Yes: Internal: 14 steps; on left going up and half of stairs have bilateral rails Has following equipment at home: Single point cane, Shower bench, and Grab bars  PLOF: Requires assistive device for independence, Needs assistance with ADLs, Needs assistance with homemaking, Needs assistance with gait, and Needs assistance with transfers  PATIENT GOALS: "To try to get better"  OBJECTIVE:   DIAGNOSTIC FINDINGS:  MRI cervical spine 5/29: IMPRESSION: 1. Positive for evidence of both Ligamentous Injury, and  Abnormal Cervical Spinal Cord in the setting of trauma: - Prevertebral edema or fluid from C1 through C6. Consider anterior longitudinal ligament injury, although no discrete ligamentous disruption is identified. - mild posterior interspinous ligamentous injury at C3-C4 eccentric to the right. - Abnormal spinal cord signal from C3 to the C5 cord level. While some of this most resembles chronic Myelomalacia (see #2), a component of Acute Cord Contusion cannot be excluded - especially in the left central cord at C3.   *Underlying bulky cervical spine degeneration with spinal stenosis AND spinal cord mass effect from C3-C4 (Moderate) through C5-C6 (mild). Associated moderate to severe degenerative neural foraminal stenosis at the bilateral C4, C5, and left C6 nerve levels.   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  COGNITION: Overall cognitive status:  Wife answers for pt primarily, pt appears to have delayed processing and requires repetition of questions.   SENSATION: Light touch: WFL and pt reporting tingling all the way down the leg  COORDINATION: LE RAMS:  slow and deliberate BLE heel-to-shin:  limited due to functional weakness  EDEMA:  None noted in BLE, pt and wife deny swelling at home, he is wearing bilateral compression stockings for DVT prevention per wife.  MUSCLE TONE: Questionable left clonus  POSTURE: forward head-wearing soft cervical collar  LOWER EXTREMITY ROM/MMT:     Active  Right Eval Left Eval  Hip flexion 3+/5 2+/5  Hip extension    Hip abduction    Hip adduction    Hip internal rotation    Hip external rotation    Knee flexion    Knee extension 4-/5 3/5  Ankle dorsiflexion 4/5 3/5  Ankle plantarflexion    Ankle inversion    Ankle eversion     (Blank rows = not tested)  BED MOBILITY:  Sit to supine Complete Independence Supine to sit Complete Independence Rolling to Right Complete  Independence Rolling to Left Complete Independence *Pain with rolling  TRANSFERS: Assistive device utilized: Single point cane  Sit to stand: SBA Stand to sit: SBA Chair to chair: SBA  GAIT: Gait pattern: step through pattern, decreased arm swing- Left, decreased step length- Left, decreased stride length, and narrow BOS Distance walked: various clinic distances Assistive device utilized: Single point cane Level of assistance: SBA  FUNCTIONAL TESTS:  5 times sit to stand: 27.91  seconds w/ BUE support 10 meter walk test: 14.82 seconds w/ SPC = 0.67 m/sec OR 2.23 ft/sec Berg Balance Scale: To be assessed.  PATIENT SURVEYS:  None completed due to time.  TODAY'S TREATMENT:                                                                                                                              DATE: N/A eval only.    PATIENT EDUCATION: Education details: PT POC, assessments used and to be used, and goals to be set. Person educated: Patient and Spouse Education method: Explanation Education comprehension: verbalized understanding and needs further education  HOME EXERCISE PROGRAM: To be established.    GOALS: Goals reviewed with patient? Yes  SHORT TERM GOALS: Target date: ***  *** Baseline: Goal status: INITIAL  2.  *** Baseline:  Goal status: INITIAL  3.  *** Baseline:  Goal status: INITIAL  4.  *** Baseline:  Goal status: INITIAL  5.  *** Baseline:  Goal status: INITIAL  6.  *** Baseline:  Goal status: INITIAL  LONG TERM GOALS: Target date: ***  *** Baseline:  Goal status: INITIAL  2.  *** Baseline:  Goal status: INITIAL  3.  *** Baseline:  Goal status: INITIAL  4.  *** Baseline:  Goal status: INITIAL  5.  *** Baseline:  Goal status: INITIAL  6.  *** Baseline:  Goal status: INITIAL  ASSESSMENT:  CLINICAL IMPRESSION: Patient is a *** y.o. *** who was seen today for physical therapy evaluation and treatment for ***.    OBJECTIVE IMPAIRMENTS: {opptimpairments:25111}.   ACTIVITY LIMITATIONS: {activitylimitations:27494}  PARTICIPATION LIMITATIONS: {participationrestrictions:25113}  PERSONAL FACTORS: {Personal factors:25162} are also affecting patient's functional outcome.   REHAB POTENTIAL: {rehabpotential:25112}  CLINICAL DECISION MAKING: {clinical decision making:25114}  EVALUATION COMPLEXITY: {Evaluation complexity:25115}  PLAN:  PT FREQUENCY: 1-2x/week (scheduling for 1x/wk initially until out of cervical collar)  PT DURATION: 8 weeks  PLANNED INTERVENTIONS: {rehab planned interventions:25118::"Therapeutic exercises","Therapeutic activity","Neuromuscular re-education","Balance training","Gait training","Patient/Family education","Self Care","Joint mobilization"}  PLAN FOR NEXT SESSION: Sadie Haber, PT, DPT 09/25/2022, 1:24 PM

## 2022-09-29 ENCOUNTER — Telehealth: Payer: Self-pay | Admitting: Physical Medicine and Rehabilitation

## 2022-09-29 NOTE — Telephone Encounter (Signed)
Gerhard Perches (Key: [id number]) PA HYDROCODONE has been sent to [facility].

## 2022-09-29 NOTE — Telephone Encounter (Signed)
Requesting refill and for prior authorization to be sent for hydrocodone.

## 2022-09-29 NOTE — Telephone Encounter (Signed)
Gerhard Perches (Key: [id number]) PA HYDROCODONE has been sent to [facility].   Closing encounter previous encounter still open.

## 2022-10-01 ENCOUNTER — Other Ambulatory Visit: Payer: Self-pay

## 2022-10-01 MED ORDER — HYDROCODONE-ACETAMINOPHEN 10-325 MG PO TABS: 1.0000 | ORAL_TABLET | Freq: Four times a day (QID) | ORAL | 0 refills | Status: DC | PRN

## 2022-10-01 NOTE — Telephone Encounter (Signed)
Refill request

## 2022-10-01 NOTE — Telephone Encounter (Signed)
Hydrocodone/Apap 10-325 approved Q 120/20 days Exp: 03/24/23

## 2022-10-05 ENCOUNTER — Other Ambulatory Visit (HOSPITAL_COMMUNITY): Payer: Self-pay

## 2022-10-08 ENCOUNTER — Other Ambulatory Visit: Payer: Self-pay | Admitting: Physician Assistant

## 2022-10-09 ENCOUNTER — Ambulatory Visit: Payer: PRIVATE HEALTH INSURANCE | Admitting: Physical Therapy

## 2022-10-09 ENCOUNTER — Other Ambulatory Visit (HOSPITAL_BASED_OUTPATIENT_CLINIC_OR_DEPARTMENT_OTHER): Payer: Self-pay

## 2022-10-09 ENCOUNTER — Encounter: Payer: Self-pay | Admitting: Physical Therapy

## 2022-10-09 DIAGNOSIS — M62838 Other muscle spasm: Secondary | ICD-10-CM

## 2022-10-09 DIAGNOSIS — M6281 Muscle weakness (generalized): Secondary | ICD-10-CM

## 2022-10-09 DIAGNOSIS — R2681 Unsteadiness on feet: Secondary | ICD-10-CM | POA: Diagnosis not present

## 2022-10-09 DIAGNOSIS — R278 Other lack of coordination: Secondary | ICD-10-CM

## 2022-10-09 DIAGNOSIS — M79602 Pain in left arm: Secondary | ICD-10-CM

## 2022-10-09 MED ORDER — LISINOPRIL 10 MG PO TABS
10.0000 mg | ORAL_TABLET | Freq: Every day | ORAL | 0 refills | Status: DC
  Filled 2022-10-09: qty 30, 30d supply, fill #0

## 2022-10-09 MED ORDER — DULOXETINE HCL 60 MG PO CPEP
60.0000 mg | ORAL_CAPSULE | Freq: Every day | ORAL | 0 refills | Status: DC
  Filled 2022-10-09: qty 30, 30d supply, fill #0

## 2022-10-09 NOTE — Therapy (Signed)
OUTPATIENT PHYSICAL THERAPY NEURO TREATMENT   Patient Name: Kenneth Flores MRN: [id number] DOB:1961/02/04, 62 y.o., male Today's Date: 10/09/2022   PCP: Norm Salt, [place] REFERRING PROVIDER: Milinda Antis, PA-C  END OF SESSION:  PT End of Session - 10/09/22 1235     Visit Number 2    Number of Visits 17   16 + eval   Date for PT Re-Evaluation 11/27/22   pushed out due to delay in scheduling   Authorization Type [employer] ULTRA    PT Start Time 1230    PT Stop Time 1313    PT Time Calculation (min) 43 min    Equipment Utilized During Treatment Gait belt;Cervical collar    Activity Tolerance Patient tolerated treatment well    Behavior During Therapy WFL for tasks assessed/performed              Past Medical History:  Diagnosis Date   Hepatitis C    Past Surgical History:  Procedure Laterality Date   ANTERIOR CERVICAL DECOMP/DISCECTOMY FUSION N/A 08/21/2022   Procedure: ANTERIOR CERVICAL DECOMPRESSION/DISCECTOMY FUSION CERVICAL THREE-FOUR, CERVICAL FOUR-FIVE, CERVICAL FIVE-SIX;  Surgeon: Julio Sicks, MD;  Location: [facility] OR;  Service: Neurosurgery;  Laterality: N/A;   PELVIC FRACTURE SURGERY Right    Patient Active Problem List   Diagnosis Date Noted   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) 09/09/2022   Adjustment disorder with depressed mood 08/31/2022   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) 08/27/2022   Cervical spinal cord injury (HCC) 08/19/2022   Chronic hepatitis C without hepatic coma (HCC) 09/08/2021   Thrombocytopenia (HCC) 09/08/2021    ONSET DATE: 08/20/2022 (surgery date)  REFERRING DIAG: 09/16/2022  THERAPY DIAG:  Other lack of coordination  Muscle weakness (generalized)  Pain in left arm  Other muscle spasm  Unsteadiness on feet  Rationale for Evaluation and Treatment: Rehabilitation  SUBJECTIVE:                                                                                                                                                                                              SUBJECTIVE STATEMENT: Patient reports having had an episode 3 days ago where he was going to the bathroom using his SPC.  He turned to his left and his legs shifted to the right and he went into a split type position using can to steady himself.  He fell from this standing position onto his bottom and his wife came to help him up.  He thinks he scraped his left leg on the dog cage, but denies other injury or hitting head.  He denies dizziness.  Pt accompanied by: significant other-[name]  PERTINENT HISTORY: Chronic Hep C  Patient had syncopal episode in bathroom at home when urinating on 08/19/2022.  When he fell backwards he hit his head.  On 5/30, he was taken to the OR by Dr. Jordan Likes for C3-C6 anterior cervical discectomy and interbody fusion with anterior plate instrumentation secondary to multilevel cervical degenerative disease with disc space collapse, spondylosis and critical spinal stenosis with severe cord compression and signal change at level C3-4.  PAIN:  Are you having pain? Yes: NPRS scale: 7/10 Pain location: shoulders and left arm Pain description: numb Aggravating factors: random, overworking the arm Relieving factors: pain medicines  PRECAUTIONS: Cervical and Fall-per pt and wife he only has to wear soft collar when he is in therapy and walking around  WEIGHT BEARING RESTRICTIONS: No  FALLS: Has patient fallen in last 6 months? Yes. Number of falls 1-fall that caused surgical need  LIVING ENVIRONMENT: Lives with: lives with their spouse Lives in: House/apartment Stairs: Yes: Internal: 14 steps; on left going up and half of stairs have bilateral rails Has following equipment at home: Single point cane, Shower bench, and Grab bars  PLOF: Requires assistive device for independence, Needs assistance with ADLs, Needs assistance with homemaking, Needs assistance with gait, and Needs assistance with  transfers  PATIENT GOALS: "To try to get better"  OBJECTIVE:   DIAGNOSTIC FINDINGS:  MRI cervical spine 5/29: IMPRESSION: 1. Positive for evidence of both Ligamentous Injury, and Abnormal Cervical Spinal Cord in the setting of trauma: - Prevertebral edema or fluid from C1 through C6. Consider anterior longitudinal ligament injury, although no discrete ligamentous disruption is identified. - mild posterior interspinous ligamentous injury at C3-C4 eccentric to the right. - Abnormal spinal cord signal from C3 to the C5 cord level. While some of this most resembles chronic Myelomalacia (see #2), a component of Acute Cord Contusion cannot be excluded - especially in the left central cord at C3.   *Underlying bulky cervical spine degeneration with spinal stenosis AND spinal cord mass effect from C3-C4 (Moderate) through C5-C6 (mild). Associated moderate to severe degenerative neural foraminal stenosis at the bilateral C4, C5, and left C6 nerve levels.   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  COGNITION: Overall cognitive status:  Wife answers for pt primarily, pt appears to have delayed processing and requires repetition of questions.   SENSATION: Light touch: WFL and pt reporting tingling all the way down the left leg  COORDINATION: LE RAMS:  slow and deliberate BLE heel-to-shin:  limited due to functional weakness  EDEMA:  None noted in BLE, pt and wife deny swelling at home, he is wearing bilateral compression stockings for DVT prevention per wife-she states she will ask MD if these are necessary any longer.  MUSCLE TONE: Questionable left clonus  POSTURE: forward head-wearing soft cervical collar  LOWER EXTREMITY ROM/MMT:     Active  Right Eval Left Eval  Hip flexion 3+/5 2+/5  Hip extension    Hip abduction    Hip adduction    Hip internal rotation    Hip external rotation    Knee flexion    Knee extension 4-/5  3/5  Ankle dorsiflexion 4/5 3/5  Ankle plantarflexion    Ankle inversion    Ankle eversion     (Blank rows = not tested)  BED MOBILITY:  Sit to supine Complete Independence Supine to sit Complete Independence Rolling to Right Complete Independence Rolling to  Left Complete Independence *Pain with rolling  TRANSFERS: Assistive device utilized: Single point cane  Sit to stand: SBA Stand to sit: SBA Chair to chair: SBA  GAIT: Gait pattern: step through pattern, decreased arm swing- Left, decreased step length- Left, decreased stride length, and narrow BOS Distance walked: various clinic distances Assistive device utilized: Single point cane Level of assistance: SBA  FUNCTIONAL TESTS:  5 times sit to stand: 27.91 seconds w/ BUE support 10 meter walk test: 14.82 seconds w/ SPC = 0.67 m/sec OR 2.23 ft/sec Berg Balance Scale: To be assessed.  PATIENT SURVEYS:  None completed due to time.  TODAY'S TREATMENT:                                                                                                                              DATE: 10/09/2022 BERG:  [facility] PT Assessment - 10/09/22 1239       Standardized Balance Assessment   Standardized Balance Assessment Berg Balance Test      Berg Balance Test   Sit to Stand Able to stand  independently using hands    Standing Unsupported Able to stand 2 minutes with supervision    Sitting with Back Unsupported but Feet Supported on Floor or Stool Able to sit safely and securely 2 minutes    Stand to Sit Sits safely with minimal use of hands    Transfers Able to transfer with verbal cueing and /or supervision    Standing Unsupported with Eyes Closed Able to stand 10 seconds with supervision    Standing Unsupported with Feet Together Able to place feet together independently and stand for 1 minute with supervision    From Standing, Reach Forward with Outstretched Arm Can reach forward >12 cm safely (5")    From Standing Position, Pick  up Object from Floor Able to pick up shoe, needs supervision    From Standing Position, Turn to Look Behind Over each Shoulder Turn sideways only but maintains balance   limited by soft cervical collar   Turn 360 Degrees Needs close supervision or verbal cueing    Standing Unsupported, Alternately Place Feet on Step/Stool Able to complete 4 steps without aid or supervision    Standing Unsupported, One Foot in Front Able to take small step independently and hold 30 seconds    Standing on One Leg Tries to lift leg/unable to hold 3 seconds but remains standing independently    Total Score 36    Berg comment: 36/56 = significant fall risk            -Initiated HEP, see below for details.  PATIENT EDUCATION: Education details: Supervision from spouse for initial HEP - she may need to tie band around legs, discussed safe setup using chair behind him in case LLE becomes fatigued as it did today.  Reminder of next appt.  BERG outcome interpretation. Person educated: Patient and Spouse Education method: Explanation Education comprehension: verbalized understanding and needs further  education  HOME EXERCISE PROGRAM: Access Code: [email or web address] URL: [email or web address] Date: 10/09/2022 Prepared by: Camille Bal  Exercises - Side Stepping with Resistance at Thighs and Counter Support  - 1 x daily - 5 x weekly - 3 sets - 10 reps - Standing Tandem Balance with Counter Support  - 1 x daily - 5 x weekly - 1 sets - 4 reps - 30 seconds hold - Sit to Stand with Resistance Around Legs  - 1 x daily - 5 x weekly - 1-2 sets - 10 reps - Heel Raises with Counter Support  - 1 x daily - 5 x weekly - 2 sets - 12 reps  GOALS: Goals reviewed with patient? Yes  SHORT TERM GOALS: Target date: 10/23/2022  Pt will be independent and compliant with introductory strength and balance HEP in order to maintain functional progress and improve mobility. Baseline:  To be established. Goal status:  INITIAL  2.  Pt will decrease 5xSTS to </=22.91 seconds in order to demonstrate decreased risk for falls and improved functional bilateral LE strength and power. Baseline: 27.91 seconds w/ BUE support Goal status: INITIAL  3.  Pt will demonstrate a gait speed of >/=0.8 m/sec in order to decrease risk for falls. Baseline: 0.67 m/sec Goal status: INITIAL  4.  Pt will increase BERG balance score to >/=40/56 to demonstrate improved static balance. Baseline: 36/56 (7/19) Goal status: INITIAL  LONG TERM GOALS: Target date: 11/20/2022  Pt will be independent and compliant with advanced strength and balance HEP in order to maintain functional progress and improve mobility. Baseline:  To be established. Goal status: INITIAL  2.  Pt will decrease 5xSTS to </=17.91 seconds in order to demonstrate decreased risk for falls and improved functional bilateral LE strength and power. Baseline: 27.91 seconds w/ BUE support Goal status: INITIAL  3.  Pt will demonstrate a gait speed of >/=0.93 m/sec in order to decrease risk for falls. Baseline: 0.67 m/sec Goal status: INITIAL  4.  Pt will increase BERG balance score to >/=44/56 to demonstrate improved static balance. Baseline: 36/56 (7/19) Goal status: INITIAL  5.  Pt will ambulate >/=500 feet independently over unlevel surface to promote household and community access. Baseline: inside clinic level distance w/ SPC SBA Goal status: INITIAL  6.  Patient will report average 7 day pain of </=5/10 at rest in order to demonstrate improved quality of life. Baseline: 7/10 shoulders and left arm at rest Goal status: INITIAL  ASSESSMENT:  CLINICAL IMPRESSION: Assessed BERG this session with patient scoring 36/56 indicating a significant fall risk.  He continues to be limited by cervical collar with PT to follow-up regarding precautions.  Established HEP to address functional strength and static balance deficits noted in POC.  Will continue to address  fall risk, gait training, and safety with upright functional mobility.  OBJECTIVE IMPAIRMENTS: Abnormal gait, decreased activity tolerance, decreased balance, decreased cognition, decreased coordination, decreased endurance, decreased knowledge of condition, difficulty walking, decreased ROM, decreased strength, impaired sensation, impaired tone, improper body mechanics, postural dysfunction, and pain.   ACTIVITY LIMITATIONS: carrying, lifting, bending, standing, squatting, stairs, transfers, bed mobility, bathing, reach over head, and locomotion level  PARTICIPATION LIMITATIONS: meal prep, cleaning, laundry, medication management, driving, shopping, community activity, occupation, and yard work  PERSONAL FACTORS: Age, Fitness, Past/current experiences, Transportation, and 1 comorbidity: chronic Hep C  are also affecting patient's functional outcome.   REHAB POTENTIAL: Good  CLINICAL DECISION MAKING: Evolving/moderate complexity  EVALUATION COMPLEXITY: Moderate  PLAN:  PT FREQUENCY: 1-2x/week (  scheduling for 1x/wk initially until out of cervical collar)  PT DURATION: 8 weeks  PLANNED INTERVENTIONS: Therapeutic exercises, Therapeutic activity, Neuromuscular re-education, Balance training, Gait training, Patient/Family education, Self Care, Stair training, Vestibular training, Orthotic/Fit training, DME instructions, scar mobilization, Manual therapy, and Re-evaluation  PLAN FOR NEXT SESSION: Modify strength and balance HEP prn and walking program for patient to do with wife's supervision initially.    Gait training w/ vs w/o SPC vs rollator for long/unlevel.  Stair training.  Endurance.  Contact MD regarding cervical precautions/wear schedule!   Sadie Haber, PT, DPT 10/09/2022, 1:13 PM

## 2022-10-14 ENCOUNTER — Encounter: Payer: Self-pay | Admitting: Physical Therapy

## 2022-10-14 ENCOUNTER — Ambulatory Visit: Payer: PRIVATE HEALTH INSURANCE | Admitting: Occupational Therapy

## 2022-10-14 ENCOUNTER — Ambulatory Visit: Payer: PRIVATE HEALTH INSURANCE | Admitting: Physical Therapy

## 2022-10-14 VITALS — BP 108/71 | HR 72

## 2022-10-14 DIAGNOSIS — R278 Other lack of coordination: Secondary | ICD-10-CM

## 2022-10-14 DIAGNOSIS — M62838 Other muscle spasm: Secondary | ICD-10-CM

## 2022-10-14 DIAGNOSIS — M6281 Muscle weakness (generalized): Secondary | ICD-10-CM

## 2022-10-14 DIAGNOSIS — R2689 Other abnormalities of gait and mobility: Secondary | ICD-10-CM

## 2022-10-14 DIAGNOSIS — R2681 Unsteadiness on feet: Secondary | ICD-10-CM | POA: Diagnosis not present

## 2022-10-14 DIAGNOSIS — M79602 Pain in left arm: Secondary | ICD-10-CM

## 2022-10-14 NOTE — Patient Instructions (Addendum)
You Can Walk For A Certain Length Of Time Each Day (use rollator for safety and have wife nearby for supervision)                          Walk 4-5 minutes 2-3 times per day.             Increase 2-3  minutes every week.             Work up to 20 minutes continuously once per day.               Example:                         Day 1-2           4-5 minutes     3 times per day                         Day 7-8           10-12 minutes 2-3 times per day                         Day 13-14       20-22 minutes 1-2 times per day   - Supine Chin Tuck  - 1 x daily - 5 x weekly - 3 sets - 10 reps

## 2022-10-14 NOTE — Therapy (Signed)
OUTPATIENT PHYSICAL THERAPY NEURO TREATMENT   Patient Name: Kenneth Flores MRN: [id number] DOB:[birth year], 62 y.o., male Today's Date: 10/14/2022   PCP: Norm Salt, [place] REFERRING PROVIDER: Milinda Antis, PA-C  END OF SESSION:  PT End of Session - 10/14/22 1626     Visit Number 3    Number of Visits 17   16 + eval   Date for PT Re-Evaluation 11/27/22   pushed out due to delay in scheduling   Authorization Type [employer]    PT Start Time 1617   handoff from OT   PT Stop Time 1700    PT Time Calculation (min) 43 min    Equipment Utilized During Treatment Gait belt;Cervical collar    Activity Tolerance Patient tolerated treatment well    Behavior During Therapy WFL for tasks assessed/performed              Past Medical History:  Diagnosis Date   Hepatitis C    Past Surgical History:  Procedure Laterality Date   ANTERIOR CERVICAL DECOMP/DISCECTOMY FUSION N/A 08/21/2022   Procedure: ANTERIOR CERVICAL DECOMPRESSION/DISCECTOMY FUSION CERVICAL THREE-FOUR, CERVICAL FOUR-FIVE, CERVICAL FIVE-SIX;  Surgeon: Julio Sicks, MD;  Location: [facility] OR;  Service: Neurosurgery;  Laterality: N/A;   PELVIC FRACTURE SURGERY Right    Patient Active Problem List   Diagnosis Date Noted   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) 09/09/2022   Adjustment disorder with depressed mood 08/31/2022   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) 08/27/2022   Cervical spinal cord injury (HCC) 08/19/2022   Chronic hepatitis C without hepatic coma (HCC) 09/08/2021   Thrombocytopenia (HCC) 09/08/2021    ONSET DATE: 08/20/2022 (surgery date)  REFERRING DIAG: 09/16/2022  THERAPY DIAG:  Other lack of coordination  Muscle weakness (generalized)  Pain in left arm  Other muscle spasm  Unsteadiness on feet  Other abnormalities of gait and mobility  Rationale for Evaluation and Treatment: Rehabilitation  SUBJECTIVE:                                                                                                                                                                                              SUBJECTIVE STATEMENT: Patient states he has ongoing mid-back pain and left shoulder pain.  He feels back to baseline following fall mentioned last visit.  He presents to session using rollator and wearing cervical soft collar.  He has PCP appt Friday to discuss medication changes. Pt accompanied by: significant other-[name]  PERTINENT HISTORY: Chronic Hep C  Patient had syncopal episode in bathroom at home when urinating on 08/19/2022.  When he fell backwards he hit  his head.  On 5/30, he was taken to the OR by Dr. Jordan Likes for C3-C6 anterior cervical discectomy and interbody fusion with anterior plate instrumentation secondary to multilevel cervical degenerative disease with disc space collapse, spondylosis and critical spinal stenosis with severe cord compression and signal change at level C3-4.  PAIN:  Are you having pain? Yes: NPRS scale: 7/10 Pain location: shoulders and left arm Pain description: numb Aggravating factors: random, overworking the arm Relieving factors: pain medicines  PRECAUTIONS: Cervical and Fall-per Dr. Jordan Likes in-basket 7/24 pt has no restrictions.  WEIGHT BEARING RESTRICTIONS: No  FALLS: Has patient fallen in last 6 months? Yes. Number of falls 1-fall that caused surgical need  LIVING ENVIRONMENT: Lives with: lives with their spouse Lives in: House/apartment Stairs: Yes: Internal: 14 steps; on left going up and half of stairs have bilateral rails Has following equipment at home: Single point cane, Shower bench, and Grab bars  PLOF: Requires assistive device for independence, Needs assistance with ADLs, Needs assistance with homemaking, Needs assistance with gait, and Needs assistance with transfers  PATIENT GOALS: "To try to get better"  OBJECTIVE:   DIAGNOSTIC FINDINGS:  MRI cervical spine 5/29: IMPRESSION: 1. Positive for evidence of  both Ligamentous Injury, and Abnormal Cervical Spinal Cord in the setting of trauma: - Prevertebral edema or fluid from C1 through C6. Consider anterior longitudinal ligament injury, although no discrete ligamentous disruption is identified. - mild posterior interspinous ligamentous injury at C3-C4 eccentric to the right. - Abnormal spinal cord signal from C3 to the C5 cord level. While some of this most resembles chronic Myelomalacia (see #2), a component of Acute Cord Contusion cannot be excluded - especially in the left central cord at C3.   *Underlying bulky cervical spine degeneration with spinal stenosis AND spinal cord mass effect from C3-C4 (Moderate) through C5-C6 (mild). Associated moderate to severe degenerative neural foraminal stenosis at the bilateral C4, C5, and left C6 nerve levels.   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  COGNITION: Overall cognitive status:  Wife answers for pt primarily, pt appears to have delayed processing and requires repetition of questions.   SENSATION: Light touch: WFL and pt reporting tingling all the way down the left leg  COORDINATION: LE RAMS:  slow and deliberate BLE heel-to-shin:  limited due to functional weakness  EDEMA:  None noted in BLE, pt and wife deny swelling at home, he is wearing bilateral compression stockings for DVT prevention per wife-she states she will ask MD if these are necessary any longer.  MUSCLE TONE: Questionable left clonus  POSTURE: forward head-wearing soft cervical collar  LOWER EXTREMITY ROM/MMT:     Active  Right Eval Left Eval  Hip flexion 3+/5 2+/5  Hip extension    Hip abduction    Hip adduction    Hip internal rotation    Hip external rotation    Knee flexion    Knee extension 4-/5 3/5  Ankle dorsiflexion 4/5 3/5  Ankle plantarflexion    Ankle inversion    Ankle eversion     (Blank rows = not tested)  BED MOBILITY:  Sit to supine  Complete Independence Supine to sit Complete Independence Rolling to Right Complete Independence Rolling to Left Complete Independence *Pain with rolling  TRANSFERS: Assistive device utilized: Single point cane  Sit to stand: SBA Stand to sit: SBA Chair to chair: SBA  GAIT: Gait pattern: step through pattern, decreased arm swing- Left, decreased step length- Left,  decreased stride length, and narrow BOS Distance walked: various clinic distances Assistive device utilized: Single point cane Level of assistance: SBA  FUNCTIONAL TESTS:  5 times sit to stand: 27.91 seconds w/ BUE support 10 meter walk test: 14.82 seconds w/ SPC = 0.67 m/sec OR 2.23 ft/sec Berg Balance Scale: To be assessed.  PATIENT SURVEYS:  None completed due to time.  TODAY'S TREATMENT:                                                                                                                              DATE: 10/14/2022 -SciFit level 1.0 using BUE/BLE over 8 minutes for neural priming and reciprocal mobility. -Informed patient of lifted restrictions.  Discussed progressing out of cervical collar over time and promoting strengthening and tolerance to unsupported neck. -Discussed safety using rollator, instructed on using brakes when standing and approaching sitting and positioning rollator to prevent creating an obstacle for himself when turning to sit. -Walking program: You Can Walk For A Certain Length Of Time Each Day (use rollator for safety and have wife nearby for supervision)                          Walk 4-5 minutes 2-3 times per day.             Increase 2-3  minutes every week.             Work up to 20 minutes continuously once per day.               Example:                         Day 1-2           4-5 minutes     3 times per day                         Day 7-8           10-12 minutes 2-3 times per day                         Day 13-14       20-22 minutes 1-2 times per day  STAIRS:  Level of  Assistance: SBA  Stair Negotiation Technique: Step to Pattern Alternating Pattern  Forwards with Bilateral Rails  Number of Stairs: 4x3   Height of Stairs: 6"  Comments: Patient educated on using "up with the good and down with the bad" in combination with a step to pattern when fatigued or particularly weak.  He is able to using reciprocal gait with bilateral rails without issue today, but required 2 instances of cuing to correct foot placement when stepping down to promote foot flat on contact vs stepping off onto PF foot.  -Supine gentle AROM cervical rotation working in to  midrange of motion x2 minutes, no pain. -Supine cervical extension x1 minute, slow gentle AROM, no pain -Supine chin tucks 2x10, no pain, added to HEP  PATIENT EDUCATION: Education details: Walking program with supervision from spouse using rollator.  Discussed tracking steps using phone app if he prefers to using minutes to track walking progress.  Lifted restrictions.  Take medication list to PCP appt Friday as discussed with OT.  Patient inquires about recent heat intolerance and PT informs patient of SCI temperature dysregulation as a possible explanation.  Discussed having wife come in to next appt to schedule second visit each week until end of POC. Person educated: Patient and Spouse-in lobby (spoke to her at end of session) Education method: Explanation Education comprehension: verbalized understanding and needs further education  HOME EXERCISE PROGRAM: Access Code: [email or web address] URL: [email or web address] Date: 10/09/2022 Prepared by: Camille Bal  Exercises - Side Stepping with Resistance at Thighs and Counter Support  - 1 x daily - 5 x weekly - 3 sets - 10 reps - Standing Tandem Balance with Counter Support  - 1 x daily - 5 x weekly - 1 sets - 4 reps - 30 seconds hold - Sit to Stand with Resistance Around Legs  - 1 x daily - 5 x weekly - 1-2 sets - 10 reps - Heel Raises with Counter Support   - 1 x daily - 5 x weekly - 2 sets - 12 reps - Supine Chin Tuck  - 1 x daily - 5 x weekly - 3 sets - 10 reps   You Can Walk For A Certain Length Of Time Each Day (use rollator for safety and have wife nearby for supervision)                          Walk 4-5 minutes 2-3 times per day.             Increase 2-3  minutes every week.             Work up to 20 minutes continuously once per day.               Example:                         Day 1-2           4-5 minutes     3 times per day                         Day 7-8           10-12 minutes 2-3 times per day                         Day 13-14       20-22 minutes 1-2 times per day  GOALS: Goals reviewed with patient? Yes  SHORT TERM GOALS: Target date: 10/23/2022  Pt will be independent and compliant with introductory strength and balance HEP in order to maintain functional progress and improve mobility. Baseline:  To be established. Goal status: INITIAL  2.  Pt will decrease 5xSTS to </=22.91 seconds in order to demonstrate decreased risk for falls and improved functional bilateral LE strength and power. Baseline: 27.91 seconds w/ BUE support Goal status: INITIAL  3.  Pt will demonstrate a gait speed of >/=0.8 m/sec in order to decrease risk for falls. Baseline: 0.67  m/sec Goal status: INITIAL  4.  Pt will increase BERG balance score to >/=40/56 to demonstrate improved static balance. Baseline: 36/56 (7/19) Goal status: INITIAL  LONG TERM GOALS: Target date: 11/20/2022  Pt will be independent and compliant with advanced strength and balance HEP in order to maintain functional progress and improve mobility. Baseline:  To be established. Goal status: INITIAL  2.  Pt will decrease 5xSTS to </=17.91 seconds in order to demonstrate decreased risk for falls and improved functional bilateral LE strength and power. Baseline: 27.91 seconds w/ BUE support Goal status: INITIAL  3.  Pt will demonstrate a gait speed of >/=0.93 m/sec in  order to decrease risk for falls. Baseline: 0.67 m/sec Goal status: INITIAL  4.  Pt will increase BERG balance score to >/=44/56 to demonstrate improved static balance. Baseline: 36/56 (7/19) Goal status: INITIAL  5.  Pt will ambulate >/=500 feet independently over unlevel surface to promote household and community access. Baseline: inside clinic level distance w/ SPC SBA Goal status: INITIAL  6.  Patient will report average 7 day pain of </=5/10 at rest in order to demonstrate improved quality of life. Baseline: 7/10 shoulders and left arm at rest Goal status: INITIAL  ASSESSMENT:  CLINICAL IMPRESSION: Session focus varied today based on newly lifted precautions.  Patient tolerating gentle cervical AROM well with low level addition of chin tucks made to HEP.  He does need further gait training with rollator mostly to promote safety with turning, brake utilization, and general control over unlevel surfaces.  Stair management addressed with patient safest using rails this visit, but he can manage a reciprocal pattern at SBA level.  Will continue per POC.  OBJECTIVE IMPAIRMENTS: Abnormal gait, decreased activity tolerance, decreased balance, decreased cognition, decreased coordination, decreased endurance, decreased knowledge of condition, difficulty walking, decreased ROM, decreased strength, impaired sensation, impaired tone, improper body mechanics, postural dysfunction, and pain.   ACTIVITY LIMITATIONS: carrying, lifting, bending, standing, squatting, stairs, transfers, bed mobility, bathing, reach over head, and locomotion level  PARTICIPATION LIMITATIONS: meal prep, cleaning, laundry, medication management, driving, shopping, community activity, occupation, and yard work  PERSONAL FACTORS: Age, Fitness, Past/current experiences, Transportation, and 1 comorbidity: chronic Hep C  are also affecting patient's functional outcome.   REHAB POTENTIAL: Good  CLINICAL DECISION MAKING:  Evolving/moderate complexity  EVALUATION COMPLEXITY: Moderate  PLAN:  PT FREQUENCY: 1-2x/week (scheduling for 1x/wk initially until out of cervical collar)  PT DURATION: 8 weeks  PLANNED INTERVENTIONS: Therapeutic exercises, Therapeutic activity, Neuromuscular re-education, Balance training, Gait training, Patient/Family education, Self Care, Stair training, Vestibular training, Orthotic/Fit training, DME instructions, scar mobilization, Manual therapy, and Re-evaluation  PLAN FOR NEXT SESSION: Modify strength and balance HEP prn.   Gait training w/ vs w/o SPC vs rollator for long/unlevel.  Endurance.  Cervical isometrics, try seated cervical AROM.  Have wife schedule 2nd visit each week.  LLE NMR, hurdles,   Sadie Haber, PT, DPT 10/14/2022, 5:03 PM

## 2022-10-14 NOTE — Therapy (Signed)
OUTPATIENT OCCUPATIONAL THERAPY NEURO TREATMENT  Patient Name: Kenneth Flores MRN: [id number] DOB:[birth year], 62 y.o., male Today's Date: 10/14/2022  PCP: Norm Salt, [place] REFERRING PROVIDER: Milinda Antis, PA-C  END OF SESSION:  OT End of Session - 10/14/22 1521     Visit Number 2    Number of Visits 16   + Evaluation   Date for OT Re-Evaluation 11/27/22    Authorization Type [employer] 2024 VL: 60 (PT/OT/ST)    OT Start Time 1530    OT Stop Time 1615    OT Time Calculation (min) 45 min    Equipment Utilized During Treatment Putty    Activity Tolerance Patient tolerated treatment well    Behavior During Therapy WFL for tasks assessed/performed             Past Medical History:  Diagnosis Date   Hepatitis C    Past Surgical History:  Procedure Laterality Date   ANTERIOR CERVICAL DECOMP/DISCECTOMY FUSION N/A 08/21/2022   Procedure: ANTERIOR CERVICAL DECOMPRESSION/DISCECTOMY FUSION CERVICAL THREE-FOUR, CERVICAL FOUR-FIVE, CERVICAL FIVE-SIX;  Surgeon: Julio Sicks, MD;  Location: [facility] OR;  Service: Neurosurgery;  Laterality: N/A;   PELVIC FRACTURE SURGERY Right    Patient Active Problem List   Diagnosis Date Noted   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) 09/09/2022   Adjustment disorder with depressed mood 08/31/2022   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  Info  from PT note and confirmed by patient today: Patient reports having had an episode [~10/06/22]where he was going to the bathroom using his SPC. He turned to his left and his legs shifted to the right and he went into a split type position using cane to steady himself. He fell from this standing position onto his bottom and his wife came to help him up. He thinks he scraped his left leg on the dog cage, but denies other injury or hitting head. He denies dizziness.  Pt accompanied by: self   PERTINENT HISTORY:   Hx of chronic Hep C, status post micturition related syncope from vasovagal episode at home on 08/19/2022 resulting in fall with blow to head. Imaging revealed evidence of significant multilevel cervical degenerative disease with associated disc space collapse and associated spondylosis. Patient with critical spinal stenosis with severe cord compression and cord signal change at C3-4. Severe stenosis at C4-5 and C5-6 without marked cord signal abnormality at these levels. There is some changes posteriorly from C3-C6 with some worrisome findings for some posterior ligamentous injury. There was no evidence of fracture on his CT scan. There is no evidence of malalignment. Neurosurgery consulted and admitted the patient for IV steroids and pain control. On 5/30, he was taken to the OR and underwent C3-4, C4-5, C5-6 anterior cervical discectomy with interbody fusion utilizing interbody cages, local harvested autograft, and anterior plate instrumentation. Motor function improved.   In patient Rehab 08/27/2022 - 09/16/2022   Discharge Diagnoses:  Principal Problem:  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) Active Problems:   Adjustment disorder with depressed mood Active problems: Functional deficits secondary to incomplete spinal cord lesion at C5-C7 level Constipation History of chronic hepatitis C Neurogenic bladder with urinary retention Neurogenic bowel Hypertension  PRECAUTIONS: Cervical  and Other: soft collar when moving/walking but takes it off at rest and when sleeping  WEIGHT BEARING RESTRICTIONS: No  PAIN:  Are you having pain? Yes: NPRS scale: 7/10 Pain location: L arm, shoulder and across shoulder/mid-back Pain description: throbbing (leg) vibrating Aggravating factors: Overworking it, doing too much Relieving factors: rest/relaxing, oral medication 4x/day, heating pad sometimes   FALLS: Has patient fallen in last 6 months? Yes. Number of falls Previously 2 related to same vasovagal issues - the first fall without injury was a couple of weeks prior to the second fall when he hit his head on the sink   Additional fall around 10/06/22 when his legs did a split while trying to walk at home  LIVING ENVIRONMENT: Lives with: lives with their spouse - [name] Lives in: House/apartment Stairs: Yes: Internal: 14 steps; on right going up, on left going up, and only half way up on the R side; wife walks with him upstairs to the bedrooms Has following equipment at home: Single point cane, Shower bench, and Grab bars  PLOF: Independent, driving, worked as Administrator (x40 hours/week)  PATIENT GOALS: Get my L arm better, tie shoelaces tighter so they stay tied  OBJECTIVE:   HAND DOMINANCE: Right  ADLs: Overall ADLs: Min to mod assist Transfers/ambulation related to ADLs: SBA with cane Eating: Needs help to cut his food Grooming: He tries to shave himself but gets help for the things he can't reach/get to on his own. UB Dressing: He can dress himself with simple clothes but needs extra time but needs help with fasteners LB Dressing: Needs help to get TED hose on and tie shoelaces Toileting: MI but wife is still monitoring him Bathing: Wife assists with his back and is present during showers Tub Shower transfers: Tub transfer bench - wife is there for extra support due to him being wobbly and has been trying to step into the tub with grab bars rather than sitting on the bench  all the time Equipment: Transfer tub bench and Grab bars  IADLs: Shopping: Wife is performing shopping. Light housekeeping: Dependent on family ie) Not able to safely or effectively help take out the trash, mow the lawn etc. Meal Prep: Wife performs this.  He did help with this a little previously. Community mobility: Uses cane with supervision Medication management: No meds previously but now 4x/day.  Using pill box and patient did sort them himself the last time but his wife is checking to make sure they are accurate and calls him to remind him to take his medication as needed (ie if she is working Catering manager). Financial management: Wife is helping more than she needed to previously Handwriting: 50% legible - Patient reports his current writing is not as good as before (about 50% compared to before) but it has improved significantly from when he had to mark/sign with an "X" only.    Patient is never home alone (if his wife is at work, his stepson will be there).  He stays on the bottom level of the home during the daytime and is off the narcotic in the daytime.  He uses his cane in the house  MOBILITY STATUS: Needs Assist: supervision for safety  POSTURE COMMENTS:  forward head  Sitting balance: WFL  ACTIVITY TOLERANCE: Activity tolerance: Decreased.  Patient reports falling asleep/napping daily at this time (combo of medication etc)  FUNCTIONAL OUTCOME MEASURES: Quick Dash: 88.6  (see eval report for complete list of limitations).  UPPER EXTREMITY ROM:    Active ROM Right eval Left eval  Shoulder flexion 100 90  Shoulder abduction 90 75  Shoulder adduction    Shoulder extension Limited end range   Shoulder internal rotation   Shoulder external rotation    Elbow flexion Limited end range  Elbow extension Decreased end range (L side spasmed with stretch  Wrist flexion    Wrist extension    Wrist ulnar deviation    Wrist radial deviation    Wrist pronation    Wrist supination     Digital extension Decreased end range and slow to open   (Blank rows = not formally tested at eval)  UPPER EXTREMITY MMT:     MMT Right eval Left eval  Shoulder flexion 3- 3-  Shoulder abduction    Shoulder adduction    Shoulder extension    Shoulder internal rotation    Shoulder external rotation    Middle trapezius    Lower trapezius    Elbow flexion 3 3-  Elbow extension    Wrist flexion    Wrist extension    Wrist ulnar deviation    Wrist radial deviation    Wrist pronation    Wrist supination    (Blank rows = not formally tested at eval)  HAND FUNCTION: Grip strength: Right: 21.1, 18.2, 20.0 lbs; Left: 8.5, 5.7, 7.4  lbs Evaluation: Right Avg 19.8 lbs Left Avg 7.2 lbs  COORDINATION: 9 Hole Peg test: Right: 45.22  sec; Left: 1:40.28  sec Box and Blocks:  Right 41 blocks, Left 31 blocks  SENSATION: Not tested  EDEMA: None noted in UEs  MUSCLE TONE: Patient has some stiffness with ROM  COGNITION: Overall cognitive status: Within functional limits for tasks assessed BIMS from hospital (08/2022) 15/15  VISION: Subjective report: No changes since accident Baseline vision: Bifocals Visual history: NA  VISION ASSESSMENT: Not tested  Patient has no changes or difficulty with any activities due to vision.  PERCEPTION: Not tested  PRAXIS: Not tested  EVALUATION OBSERVATIONS: Patient seen following physical therapy services this afternoon.  He arrived ambulating with a single-point cane.  Patient is accompanied by his wife.  Patient is soft-spoken and noted to refer to his wife for answers throughout session.  He has noted to have limitations in bilateral upper extremity range of motion left greater than right with limited success with overhead reaching and extended time for fine motor tasks with both sides but particularly left upper extremity.   TODAY'S TREATMENT:                                                                                                                               DATE:   Therapeutic Exercises:  Patient reports he has several different colored puttys at home and updated Putty HEP provided:   Initiated Putty Exercises with use of red/pink medium putty to begin strengthening, coordination and sensory stimulation of B UEs.  Patient provided visual demo, verbal and tactile cues as needed to improve performance of the various exercises:   - Putty Squeezes - cues to squeeze putty into log for use with other exercises and to fold putty in half with 1 hand   Try to fold putty in half with your hand (not using the table to help).  - Rolling Putty on Table - cues to be careful about excessive abduction of thumb during rolling motion.   Work on extending wrists as you roll.   Try to get your fingers as straight as possible.   Try to roll putty while standing at the table.  - Pinch and Pull with Putty combined with pinches (3-Point Pinch, Tip Pinch, Key Pinch) - patient encouraged to combine tripod, pincer and/or key pinch with "pinch and pull" motion of putty away from midline changing between different pinches   Descriptions provided to help distinguish grasps:   3- point Pinch = THUMB & first 2 fingers.   Pincer  = THUMB & first fingerTIP   Key pinch = THUMB & SIDE of index finger  - Removing Objects from Putty  - encouraged to hide items (coins, marble, dice etc) and use one hand at a time to find the objects and identify them by tactile input before he digs them out and can see them visually.   He is encouraged to use softer putty as necessary for L hand compared to R hand, stand as able for some tasks ie) rolling putty on table top and to work both hands for different exercises throughout the day.  PATIENT EDUCATION: Education details: Putty Exercises Person educated: Patient Education method: Explanation, Demonstration, Tactile cues, Verbal cues, and Handouts - patient able to update his online HEP App with new exercises  today Education comprehension: verbalized understanding, returned demonstration, verbal cues required, tactile cues required, and needs further education  HOME EXERCISE PROGRAM: 10/14/22: Putty Exercises - [email or web address]  Access Code: [email or web address]   GOALS: Goals reviewed with patient? Yes   SHORT TERM GOALS: Target date: 10/30/22  Patient will improve overhead reaching > 100 degrees for B shoulders to assist with ADLs. Baseline: R 100* L 90* Goal status: INITIAL  2.  Patient will demonstrate at least 30 lbs RUE and 15 lbs LUE grip strength as needed to open containers and carry lightweight objects without dropping them.  Baseline: Right Avg 19.8 lbs Left Avg 7.2 lbs Goal status: IN PROGRESS  3.  Patient will complete nine-hole peg with use of RUE in 35 seconds or less and LUE 75 seconds or less.  Baseline: 9 Hole Peg test: Right: 45.22  sec; Left: 1:40.28  sec Goal status: INITIAL  4.  Patient will demonstrate UE strengthening HEP (putty/theraband etc) with 25% verbal cues or less for proper execution. Baseline: Has various materials from hospital. Goal status: IN PROGRESS  5.  Patient will demonstrate good digital A/AROM HEP to minimize stiffness in hands (esp L) which impacts coordination ie) in hand manipulation of small objects. Baseline: Stiff digital extension with decreased end range and difficulty with managing pegs for 9 hole peg test ie) dropped items etc.  Goal status: IN PROGRESS  LONG TERM GOALS: Target date: 11/27/22  Patient will demonstrate UE strength and coordination to cut various foods using  appropriate AE if necessary. Baseline: Unable to cut food/Assisted by family Goal status: IN PROGRESS  2.  Patient will demonstrate UE pinch strength and coordination to tie shoelaces tightly. Baseline: Shoelaces frequently unfasten Goal status: IN PROGRESS  3.  Patient will demonstrate at least 40 lbs RUE and 20 lbs LUE grip strength as needed to open  containers and carry lightweight objects.  Baseline: Right Avg 19.8 lbs Left Avg 7.2 lbs Goal status: IN PROGRESS  4.  Patient will complete nine-hole peg with use of RUE in 35 seconds or less and LUE 75 seconds or less.  Baseline: 9 Hole Peg test: Right: 45.22  sec; Left: 1:40.28  sec Goal status: INITIAL  5.  Patient will be able to complete simple food prep activities with good activity tolerance > 15 minutes and safety (ie. Decreased fall risk and implementation of safety precautions). Baseline: Family does all meal prep Goal status: INITIAL  6. Patient will demonstrate at least 20% improvement with quick Dash score (reporting <68 % disability or less) indicating improved functional use of affected extremity.   Baseline: 88.6% (10/11 categories as Severe/Unable to perform.  Goal Status: INITIAL   ASSESSMENT:  CLINICAL IMPRESSION: Patient is a 62 y.o. male who was seen today for first outpatient occupational therapy treatment s/p evaluation for incomplete quadriplegia. He has changed from quad cane to rollator s/p recent fall.  Due to significant weakness in B grip strength - first session focussed on HEP with putty which he has at home form hospital.  Patient currently presents significantly below baseline level of functioning and will benefit from progression of HEPs through skilled OT services to help pt return to PLOF as able.     PERFORMANCE DEFICITS: in functional skills including ADLs, IADLs, coordination, dexterity, proprioception, sensation, tone, ROM, strength, pain, muscle spasms, flexibility, Fine motor control, Gross motor control, mobility, balance, endurance, continence, decreased knowledge of use of DME, and UE functional use, cognitive skills including energy/drive, safety awareness, and thought, and psychosocial skills including coping strategies and routines and behaviors.   IMPAIRMENTS: are limiting patient from ADLs, IADLs, rest and sleep, work, leisure, and social  participation.   CO-MORBIDITIES: may have co-morbidities  that affects occupational performance. Patient will benefit from skilled OT to address above impairments and improve overall function.  REHAB POTENTIAL: Good   PLAN:  OT FREQUENCY: 2x/week  OT DURATION: 8 weeks  PLANNED INTERVENTIONS: self care/ADL training, therapeutic exercise, therapeutic activity, neuromuscular re-education, manual therapy, balance training, functional mobility training, splinting, fluidotherapy, patient/family education, energy conservation, coping strategies training, and DME and/or AE instructions  RECOMMENDED OTHER SERVICES: Patient had outpt PT evaluation today also.  CONSULTED AND AGREED WITH PLAN OF CARE: Patient and family member/caregiver  PLAN FOR NEXT SESSION:   Progress HEPs for putty (pinch to improve grasp for tying laces), and review/add theraband HEP for strengthening.  Introduce UE ROM exercises ie) table top slides.  Begin Coordination HEP activities.   Victorino Sparrow, OT 10/14/2022, 5:54 PM

## 2022-10-14 NOTE — Patient Instructions (Signed)
Putty Exercises:  Access Code: [email or web address] URL: [email or web address] Date: 10/14/2022 Prepared by: Amada Kingfisher  Special notes placed in each exercise for patient reference and he updated exercise program in his HEP APP.  Exercises - Putty Squeezes  - 2 x daily - 2 sets - 10 reps - Rolling Putty on Table  - 2 x daily - 2 sets - 10 reps - Finger Pinch and Pull with Putty  - 2 x daily - 2 sets - 10 reps - 3-Point Pinch with Putty  - 2 x daily - 2 sets - 10 reps - Tip PUSH with Putty  - 2 x daily - 2 sets - 10 reps - Key Pinch with Putty  - 2 x daily - 2 sets - 10 reps - Removing Marbles from Putty  - 1 x daily - 2 sets - 10 reps

## 2022-10-16 ENCOUNTER — Encounter
Payer: No Typology Code available for payment source | Attending: Physical Medicine and Rehabilitation | Admitting: Physical Medicine and Rehabilitation

## 2022-10-16 ENCOUNTER — Encounter: Payer: Self-pay | Admitting: Physical Medicine and Rehabilitation

## 2022-10-16 VITALS — BP 121/76 | HR 77 | Ht 72.0 in | Wt 176.0 lb

## 2022-10-16 DIAGNOSIS — Z79891 Long term (current) use of opiate analgesic: Secondary | ICD-10-CM | POA: Diagnosis present

## 2022-10-16 DIAGNOSIS — W1830XS Fall on same level, unspecified, sequela: Secondary | ICD-10-CM

## 2022-10-16 DIAGNOSIS — Z5181 Encounter for therapeutic drug level monitoring: Secondary | ICD-10-CM | POA: Insufficient documentation

## 2022-10-16 DIAGNOSIS — R252 Cramp and spasm: Secondary | ICD-10-CM | POA: Insufficient documentation

## 2022-10-16 DIAGNOSIS — M21372 Foot drop, left foot: Secondary | ICD-10-CM | POA: Diagnosis present

## 2022-10-16 DIAGNOSIS — F4321 Adjustment disorder with depressed mood: Secondary | ICD-10-CM | POA: Diagnosis present

## 2022-10-16 DIAGNOSIS — G8921 Chronic pain due to trauma: Secondary | ICD-10-CM | POA: Insufficient documentation

## 2022-10-16 DIAGNOSIS — Z87891 Personal history of nicotine dependence: Secondary | ICD-10-CM

## 2022-10-16 DIAGNOSIS — R269 Unspecified abnormalities of gait and mobility: Secondary | ICD-10-CM | POA: Diagnosis present

## 2022-10-16 DIAGNOSIS — G8254 Quadriplegia, C5-C7 incomplete: Secondary | ICD-10-CM | POA: Insufficient documentation

## 2022-10-16 DIAGNOSIS — S14109S Unspecified injury at unspecified level of cervical spinal cord, sequela: Secondary | ICD-10-CM

## 2022-10-16 DIAGNOSIS — K592 Neurogenic bowel, not elsewhere classified: Secondary | ICD-10-CM | POA: Diagnosis present

## 2022-10-16 DIAGNOSIS — N319 Neuromuscular dysfunction of bladder, unspecified: Secondary | ICD-10-CM | POA: Diagnosis not present

## 2022-10-16 DIAGNOSIS — I10 Essential (primary) hypertension: Secondary | ICD-10-CM

## 2022-10-16 MED ORDER — BACLOFEN 10 MG PO TABS: 5.0000 mg | ORAL_TABLET | Freq: Three times a day (TID) | ORAL | 0 refills | Status: DC

## 2022-10-16 MED ORDER — APIXABAN 2.5 MG PO TABS: 2.5000 mg | ORAL_TABLET | Freq: Two times a day (BID) | ORAL | 0 refills | Status: DC

## 2022-10-16 MED ORDER — HYDROCODONE-ACETAMINOPHEN 10-325 MG PO TABS: 1.0000 | ORAL_TABLET | Freq: Four times a day (QID) | ORAL | 0 refills | Status: DC | PRN

## 2022-10-16 MED ORDER — GABAPENTIN 600 MG PO TABS
600.0000 mg | ORAL_TABLET | Freq: Three times a day (TID) | ORAL | 5 refills | Status: DC
Start: 2022-10-16 — End: 2023-01-15

## 2022-10-16 NOTE — Patient Instructions (Signed)
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  Pt also has post op pain, nerve pain; Neurogenic bowel and bladder, HTN, and prior tobacco use;   Here for hospital f/u on SCI.    Need to walk 150 ft x 3-5x/day- or take Eliquis for another month- to prevent blood clots.   2. Speak to PCP about BP and BP meds and sees what needs to continue.     3. UDS-urine drug screen  and opiate contract- -    4. Con't Norco- 10/325 mg 4x/day- for pain control. Pain is worse when doesn't take it.   5. D/C Methocarbamol/Robaxin since only helps- pulled muscle in his back feeling.   6. Spasticity- can make you off balance- so important to take-  starts 3weeks to 6 months mark- so is likely pretty new- is progressive up to 1-2 years after an injury- injury NOT getting worse, just the spasticity- 25% isn't progressive, but 75% is does get worse.   7. Baclofen 5 mg 3x/day x 1 week, then can increase, if needed, to 10 mg 3x/day for spasticity.  Can cause constipation and sedation- if intolerable, call me- might  need ot take colace more often? If it makes you feel weaker-  First dose of the first pill/first hour- be with someone-  this is me being very cautious-   8. Refill Duloxetine and Gabapentin-  is for the burning pain- refilled gabapentin today and Duloxetine was refilled 7/19.   9. SCI support group- last Thursday of month- 6-7 pm- at [employer]- at [facility]- 1st floor conference room.   10. Discuss intimacy at next visit.   11. F/U in 3 months- double visit- SCI

## 2022-10-16 NOTE — Addendum Note (Signed)
Addended by: Janean Sark on: 10/16/2022 12:35 PM   Modules accepted: Orders

## 2022-10-16 NOTE — Progress Notes (Signed)
Subjective:    Patient ID: Kenneth Flores, male    DOB: July 15, 1960, 62 y.o.   MRN: [id number]  HPI Pt is a 62 yr old male with hx of C4 ASIS C/D- secondary to fall from syncope- s/p 5/30 fusion by [facility].  Pt also has post op pain, nerve pain; Neurogenic bowel and bladder, HTN, and prior tobacco use;   Here for hospital f/u on SCI.   Things about the same [name] 1 week ago-  still at fall risk- so went back to using Rolator-was using cane when it occurred -   Legs twisted to go one way and rest of him went other way.   Not more pain since fall; but still has shoulders, neck, back and arm pain-  Basically the same pin-   The scheduled medicine including Gabapentin and Duloxetine and doesn't feel like they help.    Was having trouble getting refills- so went a few days without Norco.   Doesn't walk very far- not very active right now.    Takes Robaxin as needed.    Dragging L leg when walks- doesn't feel like bending knees "enough"  Cannot go too fas,t because trips over feet.   Bowels- going OK- a little hard- taking Colace 2x/week- most part goes daily- if takes Colace daily, goes too much and has accidents   Bladder- peeing OK- frequent- but on hydrochlorothiazide.    Pain Inventory Average Pain 7 Pain Right Now 5 My pain is constant, burning, tingling, and aching  LOCATION OF PAIN  neck shoulder  BOWEL Number of stools per week: 7 Oral laxative use Yes  Type of laxative Colace Enema or suppository use No  History of colostomy No  Incontinent No   BLADDER Normal In and out cath, frequency n/a Able to self cath  n/a Bladder incontinence No  Frequent urination Yes  Leakage with coughing No  Difficulty starting stream No  Incomplete bladder emptying No    Mobility walk with assistance use a walker how many minutes can you walk? 5 ability to climb steps?  yes do you drive?  no Do you have any goals in this area?  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 I need assistance with the following:  bathing, meal prep, household duties, and shopping  Neuro/Psych numbness tingling trouble walking spasms  Prior Studies no  Physicians involved in your care Hospital follow up   Family History  Problem Relation Age of Onset   Hepatitis C Brother    Social History   Socioeconomic History   Marital status: Married    Spouse name: Not on file   Number of children: Not on file   Years of education: Not on file   Highest education level: Not on file  Occupational History   Not on file  Tobacco Use   Smoking status: Every Day    Types: Cigars   Smokeless tobacco: Never   Tobacco comments:    10 a day  Substance and Sexual Activity   Alcohol use: Not Currently   Drug use: Not Currently    Types: Cocaine, Marijuana    Comment: years ago   Sexual activity: Not on file  Other Topics Concern   Not on file  Social History Narrative   Not on file   Social Determinants of Health   Financial Resource Strain: Not on file  Food Insecurity: No Food Insecurity (08/19/2022)   Hunger Vital  Sign    Worried About Programme researcher, broadcasting/film/video in the Last Year: Never true    Ran Out of Food in the Last Year: Never true  Transportation Needs: No Transportation Needs (08/19/2022)   PRAPARE - Administrator, Civil Service (Medical): No    Lack of Transportation (Non-Medical): No  Physical Activity: Not on file  Stress: Not on file  Social Connections: Not on file   Past Surgical History:  Procedure Laterality Date   ANTERIOR CERVICAL DECOMP/DISCECTOMY FUSION N/A 08/21/2022   Procedure: ANTERIOR CERVICAL DECOMPRESSION/DISCECTOMY FUSION CERVICAL THREE-FOUR, CERVICAL FOUR-FIVE, CERVICAL FIVE-SIX;  Surgeon: Julio Sicks, MD;  Location: [facility] OR;  Service: Neurosurgery;  Laterality: N/A;   PELVIC FRACTURE SURGERY Right    Past Medical History:  Diagnosis Date   Hepatitis C    Ht  6' (1.829 m)   BMI 22.10 kg/m   Opioid Risk Score:   Fall Risk Score:  `1  Depression screen [facility] 2/9     03/30/2022    4:17 PM  Depression screen [facility] 2/9  Decreased Interest 0  Down, Depressed, Hopeless 0  PHQ - 2 Score 0     Review of Systems  Constitutional:        Easy bleeding  Musculoskeletal:  Positive for neck pain.       B/L shoulder LT arm B/L foot pain  All other systems reviewed and are negative.      Objective:   Physical Exam  Awake, alert, appropriate, wearing soft collar;  Using Rolator, accompanied by wife- [name], NAD wearing soft collar  MS: Deltoids 2/5; biceps 4-/5; triceps and WE 3+/5;  grip 3/5; and FA 2/5  LE's-  LLE_ HF 2/5; KE 2+/5; DF 2/5; and PF 4-/5 RLE-  HF 4-/5 KE 4-/5 DF 4+/5 and PF 4/5   Neuro:  3 beats clonus B/L in LE's- Hoffman's brisk in Ue's B/L   Gait:  Dragging L leg slightly- after walked ~ 30 ft-  Decreased knee flexion- and kicks out to side slightly with circumduction.  Steady gait overall- spasticity not impacting.      Assessment & Plan:   Pt is a 62 yr old male with hx of C4 ASIS C/D- secondary to fall from syncope- s/p 5/30 fusion by [facility].  Pt also has post op pain, nerve pain; Neurogenic bowel and bladder, HTN, and prior tobacco use;   Here for hospital f/u on SCI.    Need to walk 150 ft x 3-5x/day- or take Eliquis for another month- to prevent blood clots.   2. Speak to PCP about BP and BP meds and sees what needs to continue.     3. UDS-urine drug screen  and opiate contract- -    4. Con't Norco- 10/325 mg 4x/day- for pain control. Pain is worse when doesn't take it.   5. D/C Methocarbamol/Robaxin since only helps- pulled muscle in his back feeling.   6. Spasticity- can make you off balance- so important to take-  starts 3weeks to 6 months mark- so is likely pretty new- is progressive up to 1-2 years after an injury- injury NOT getting worse, just the spasticity- 25% isn't progressive, but 75% is does  get worse.   7. Baclofen 5 mg 3x/day x 1 week, then can increase, if needed, to 10 mg 3x/day for spasticity.  Can cause constipation and sedation- if intolerable, call me- might  need ot take colace more often? If it makes you feel weaker-  First dose  of the first pill/first hour- be with someone-  this is me being very cautious-   8. Refill Duloxetine and Gabapentin-  is for the burning pain- refilled gabapentin today and Duloxetine was refilled 7/19.   9. SCI support group- last Thursday of month- 6-7 pm- at [employer]- at [facility]- 1st floor conference room.   10. Discuss intimacy at next visit.   11. F/U in 3 months- double visit- SCI   I spent a total of 42   minutes on total care today- >50% coordination of care- due to discussion as detailed above.

## 2022-10-21 ENCOUNTER — Ambulatory Visit: Payer: PRIVATE HEALTH INSURANCE | Admitting: Physical Therapy

## 2022-10-21 ENCOUNTER — Encounter: Payer: Self-pay | Admitting: Physical Therapy

## 2022-10-21 ENCOUNTER — Ambulatory Visit: Payer: PRIVATE HEALTH INSURANCE | Admitting: Occupational Therapy

## 2022-10-21 DIAGNOSIS — M6281 Muscle weakness (generalized): Secondary | ICD-10-CM

## 2022-10-21 DIAGNOSIS — R2681 Unsteadiness on feet: Secondary | ICD-10-CM | POA: Diagnosis not present

## 2022-10-21 DIAGNOSIS — R278 Other lack of coordination: Secondary | ICD-10-CM

## 2022-10-21 DIAGNOSIS — R2689 Other abnormalities of gait and mobility: Secondary | ICD-10-CM

## 2022-10-21 DIAGNOSIS — M62838 Other muscle spasm: Secondary | ICD-10-CM

## 2022-10-21 DIAGNOSIS — M79602 Pain in left arm: Secondary | ICD-10-CM

## 2022-10-21 DIAGNOSIS — R29898 Other symptoms and signs involving the musculoskeletal system: Secondary | ICD-10-CM

## 2022-10-21 NOTE — Therapy (Unsigned)
OUTPATIENT OCCUPATIONAL THERAPY NEURO TREATMENT  Patient Name: Kenneth Flores MRN: [id number] DOB:February 16, 1961, 62 y.o., male Today's Date: 10/21/2022  PCP: Norm Salt, [place] REFERRING PROVIDER: Milinda Antis, PA-C  END OF SESSION:  OT End of Session - 10/21/22 1316     Visit Number 3    Number of Visits 16   + Evaluation   Date for OT Re-Evaluation 11/27/22    Authorization Type [employer] 2024 VL: 60 (PT/OT/ST)    OT Start Time 1316    OT Stop Time 1400    OT Time Calculation (min) 44 min    Equipment Utilized During Treatment Putty    Activity Tolerance Patient tolerated treatment well    Behavior During Therapy WFL for tasks assessed/performed             Past Medical History:  Diagnosis Date   Hepatitis C    Past Surgical History:  Procedure Laterality Date   ANTERIOR CERVICAL DECOMP/DISCECTOMY FUSION N/A 08/21/2022   Procedure: ANTERIOR CERVICAL DECOMPRESSION/DISCECTOMY FUSION CERVICAL THREE-FOUR, CERVICAL FOUR-FIVE, CERVICAL FIVE-SIX;  Surgeon: Julio Sicks, MD;  Location: [facility] OR;  Service: Neurosurgery;  Laterality: N/A;   PELVIC FRACTURE SURGERY Right    Patient Active Problem List   Diagnosis Date Noted   Neurogenic bowel 10/16/2022   Spasticity 10/16/2022   Abnormality of gait 10/16/2022   Acquired left foot drop 10/16/2022   Chronic pain due to trauma 10/16/2022   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) 09/09/2022   Adjustment disorder with depressed mood 08/31/2022   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) 08/27/2022   Cervical spinal cord injury (HCC) 08/19/2022   Chronic hepatitis C without hepatic coma (HCC) 09/08/2021   Thrombocytopenia (HCC) 09/08/2021    ONSET DATE: Referral: 09/17/2022 Hospitalized 08/19/22  REFERRING DIAG: G82.54 (ICD-10-CM) - Quadriplegia, C5-C7 incomplete  THERAPY DIAG:  Muscle weakness (generalized)  Other lack of coordination  Other muscle spasm  Pain in left arm  Other symptoms and signs involving  the musculoskeletal system  Rationale for Evaluation and Treatment: Rehabilitation  SUBJECTIVE:   SUBJECTIVE STATEMENT:  Patient reported he saw his doctor's last week and yesterday.  His surgeon, Dr. Dutch Quint said things are going well and to keep doing what he's doing in therapy and doesn't have to wear his neck brace anymore.   Pt accompanied by: self   PERTINENT HISTORY:   Hx of chronic Hep C, status post micturition related syncope from vasovagal episode at home on 08/19/2022 resulting in fall with blow to head. Imaging revealed evidence of significant multilevel cervical degenerative disease with associated disc space collapse and associated spondylosis. Patient with critical spinal stenosis with severe cord compression and cord signal change at C3-4. Severe stenosis at C4-5 and C5-6 without marked cord signal abnormality at these levels. There is some changes posteriorly from C3-C6 with some worrisome findings for some posterior ligamentous injury. There was no evidence of fracture on his CT scan. There is no evidence of malalignment. Neurosurgery consulted and admitted the patient for IV steroids and pain control. On 5/30, he was taken to the OR and underwent C3-4, C4-5, C5-6 anterior cervical discectomy with interbody fusion utilizing interbody cages, local harvested autograft, and anterior plate instrumentation. Motor function improved.   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 Yes: NPRS scale: 7/10 Pain location: L arm, shoulder and across shoulder/mid-back Pain description: throbbing (leg) vibrating Aggravating factors: Overworking it, doing too much Relieving factors: rest/relaxing, oral medication 4x/day, heating pad sometimes   FALLS: Has patient fallen in last 6 months? Yes. Number of falls Previously 2 related to same vasovagal issues - the first fall without injury was a couple of weeks prior to the second fall when he hit his head on the sink   Additional fall around 10/06/22 when his legs did a split while trying to walk at home  LIVING ENVIRONMENT: Lives with: lives with their spouse - [name] Lives in: House/apartment Stairs: Yes: Internal: 14 steps; on right going up, on left going up, and only half way up on the R side; wife walks with him upstairs to the bedrooms Has following equipment at home: Single point cane, Shower bench, and Grab bars  PLOF: Independent, driving, worked as Administrator (x40 hours/week)  PATIENT GOALS: Get my L arm better, tie shoelaces tighter so they stay tied  OBJECTIVE:   HAND DOMINANCE: Right  ADLs: Overall ADLs: Min to mod assist Transfers/ambulation related to ADLs: SBA with cane Eating: Needs help to cut his food Grooming: He tries to shave himself but gets help for the things he can't reach/get to on his own. UB Dressing: He can dress himself with simple clothes but needs extra time but needs help with fasteners LB Dressing: Needs help to get TED hose on and tie shoelaces Toileting: MI but wife is still monitoring him Bathing: Wife assists with his back and is present during showers Tub Shower transfers: Tub transfer bench - wife is there for extra support due to him being wobbly and has been trying to step into the tub with grab bars rather than sitting on the bench all the time Equipment: Transfer tub bench and Grab bars  IADLs: Shopping: Wife is performing shopping. Light housekeeping:  Dependent on family ie) Not able to safely or effectively help take out the trash, mow the lawn etc. Meal Prep: Wife performs this.  He did help with this a little previously. Community mobility: Uses cane with supervision Medication management: No meds previously but now 4x/day.  Using pill box and patient did sort them himself the last time but his wife is checking to make sure they are accurate and calls him to remind him to take his medication as needed (ie if she is working Catering manager). Financial management: Wife is helping more than she needed to previously Handwriting: 50% legible - Patient reports his current writing is not as good as before (about 50% compared to before) but it has improved significantly from when he had to mark/sign with an "X" only.    Patient is never home alone (if his wife is at work, his stepson will be there).  He stays on the bottom level of the home during the daytime and is off the narcotic in the daytime.  He uses his cane in the house  MOBILITY STATUS: Needs Assist: supervision for safety  POSTURE COMMENTS:  forward head Sitting balance: WFL  ACTIVITY TOLERANCE: Activity tolerance: Decreased.  Patient reports falling asleep/napping daily at this time (combo of medication etc)  FUNCTIONAL OUTCOME MEASURES: Quick Dash: 88.6  (  see eval report for complete list of limitations).  UPPER EXTREMITY ROM:    Active ROM Right eval Left eval  Shoulder flexion 100 90  Shoulder abduction 90 75  Shoulder adduction    Shoulder extension Limited end range   Shoulder internal rotation   Shoulder external rotation    Elbow flexion Limited end range  Elbow extension Decreased end range (L side spasmed with stretch  Wrist flexion    Wrist extension    Wrist ulnar deviation    Wrist radial deviation    Wrist pronation    Wrist supination    Digital extension Decreased end range and slow to open   (Blank rows = not formally tested at eval)  UPPER EXTREMITY MMT:      MMT Right eval Left eval  Shoulder flexion 3- 3-  Shoulder abduction    Shoulder adduction    Shoulder extension    Shoulder internal rotation    Shoulder external rotation    Middle trapezius    Lower trapezius    Elbow flexion 3 3-  Elbow extension    Wrist flexion    Wrist extension    Wrist ulnar deviation    Wrist radial deviation    Wrist pronation    Wrist supination    (Blank rows = not formally tested at eval)  HAND FUNCTION: Grip strength: Right: 21.1, 18.2, 20.0 lbs; Left: 8.5, 5.7, 7.4  lbs Evaluation: Right Avg 19.8 lbs Left Avg 7.2 lbs  COORDINATION: 9 Hole Peg test: Right: 45.22  sec; Left: 1:40.28  sec Box and Blocks:  Right 41 blocks, Left 31 blocks  SENSATION: Not tested  EDEMA: None noted in UEs  MUSCLE TONE: Patient has some stiffness with ROM  COGNITION: Overall cognitive status: Within functional limits for tasks assessed BIMS from hospital (08/2022) 15/15  VISION: Subjective report: No changes since accident Baseline vision: Bifocals Visual history: NA  VISION ASSESSMENT: Not tested  Patient has no changes or difficulty with any activities due to vision.  PERCEPTION: Not tested  PRAXIS: Not tested  EVALUATION OBSERVATIONS: Patient seen following physical therapy services this afternoon.  He arrived ambulating with a single-point cane.  Patient is accompanied by his wife.  Patient is soft-spoken and noted to refer to his wife for answers throughout session.  He has noted to have limitations in bilateral upper extremity range of motion left greater than right with limited success with overhead reaching and extended time for fine motor tasks with both sides but particularly left upper extremity.   TODAY'S TREATMENT:                                                                                                                              DATE:   Therapeutic Exercises:  Upright trunk, shoulder shrugs and table slides  - Removing  Objects from Putty  - encouraged to hide items (coins, marble, dice etc) and use one hand at a time  to find the objects and identify them by tactile input before he digs them out and can see them visually.   He is encouraged to use softer putty as necessary for L hand compared to R hand, stand as able for some tasks ie) rolling putty on table top and to work both hands for different exercises throughout the day.   Added table top shoulder ROM to HEP  Access Code: [email or web address] URL: [email or web address] Date: 10/21/2022 Prepared by: Amada Kingfisher  - Seated Shoulder Flexion Towel Slide at Table Top  - 2 x daily - 10 reps  Setup Begin sitting facing a table or counter top with your hand resting flat on a towel.  Movement Slowly lean forward to slide your hand and towel across the table. Return to the starting position and repeat. Progress to being able to lift your arm off the table when leaning forward.  Tip Try to avoid shrugging your shoulder during the exercise and make sure your back does not hurt or tingle.  PATIENT EDUCATION: Education details: Putty Exercises Person educated: Patient Education method: Explanation, Demonstration, Tactile cues, Verbal cues, and Handouts - patient able to update his online HEP App with new exercises today Education comprehension: verbalized understanding, returned demonstration, verbal cues required, tactile cues required, and needs further education  HOME EXERCISE PROGRAM: 10/14/22: Putty Exercises - [email or web address]  Access Code: [email or web address]   GOALS: Goals reviewed with patient? Yes   SHORT TERM GOALS: Target date: 10/30/22  Patient will improve overhead reaching > 100 degrees for B shoulders to assist with ADLs. Baseline: R 100* L 90* Goal status: INITIAL  2.  Patient will demonstrate at least 30 lbs RUE and 15 lbs LUE grip strength as needed to open containers and carry lightweight objects without dropping them.   Baseline: Right Avg 19.8 lbs Left Avg 7.2 lbs Goal status: IN PROGRESS  3.  Patient will complete nine-hole peg with use of RUE in 35 seconds or less and LUE 75 seconds or less.  Baseline: 9 Hole Peg test: Right: 45.22  sec; Left: 1:40.28  sec Goal status: INITIAL  4.  Patient will demonstrate UE strengthening HEP (putty/theraband etc) with 25% verbal cues or less for proper execution. Baseline: Has various materials from hospital. Goal status: IN PROGRESS  5.  Patient will demonstrate good digital A/AROM HEP to minimize stiffness in hands (esp L) which impacts coordination ie) in hand manipulation of small objects. Baseline: Stiff digital extension with decreased end range and difficulty with managing pegs for 9 hole peg test ie) dropped items etc.  Goal status: IN PROGRESS  LONG TERM GOALS: Target date: 11/27/22  Patient will demonstrate UE strength and coordination to cut various foods using appropriate AE if necessary. Baseline: Unable to cut food/Assisted by family Goal status: IN PROGRESS  2.  Patient will demonstrate UE pinch strength and coordination to tie shoelaces tightly. Baseline: Shoelaces frequently unfasten Goal status: IN PROGRESS  3.  Patient will demonstrate at least 40 lbs RUE and 20 lbs LUE grip strength as needed to open containers and carry lightweight objects.  Baseline: Right Avg 19.8 lbs Left Avg 7.2 lbs Goal status: IN PROGRESS  4.  Patient will complete nine-hole peg with use of RUE in 35 seconds or less and LUE 75 seconds or less.  Baseline: 9 Hole Peg test: Right: 45.22  sec; Left: 1:40.28  sec Goal status: INITIAL  5.  Patient will be able to complete simple food prep activities with good activity tolerance >  15 minutes and safety (ie. Decreased fall risk and implementation of safety precautions). Baseline: Family does all meal prep Goal status: INITIAL  6. Patient will demonstrate at least 20% improvement with quick Dash score (reporting <68 %  disability or less) indicating improved functional use of affected extremity.   Baseline: 88.6% (10/11 categories as Severe/Unable to perform.  Goal Status: INITIAL   ASSESSMENT:  CLINICAL IMPRESSION: Patient is a 62 y.o. male who was seen today for first outpatient occupational therapy treatment s/p evaluation for incomplete quadriplegia. He has changed from quad cane to rollator s/p recent fall.  Due to significant weakness in B grip strength - first session focussed on HEP with putty which he has at home form hospital.  Patient currently presents significantly below baseline level of functioning and will benefit from progression of HEPs through skilled OT services to help pt return to PLOF as able.     PERFORMANCE DEFICITS: in functional skills including ADLs, IADLs, coordination, dexterity, proprioception, sensation, tone, ROM, strength, pain, muscle spasms, flexibility, Fine motor control, Gross motor control, mobility, balance, endurance, continence, decreased knowledge of use of DME, and UE functional use, cognitive skills including energy/drive, safety awareness, and thought, and psychosocial skills including coping strategies and routines and behaviors.   IMPAIRMENTS: are limiting patient from ADLs, IADLs, rest and sleep, work, leisure, and social participation.   CO-MORBIDITIES: may have co-morbidities  that affects occupational performance. Patient will benefit from skilled OT to address above impairments and improve overall function.  REHAB POTENTIAL: Good   PLAN:  OT FREQUENCY: 2x/week  OT DURATION: 8 weeks  PLANNED INTERVENTIONS: self care/ADL training, therapeutic exercise, therapeutic activity, neuromuscular re-education, manual therapy, balance training, functional mobility training, splinting, fluidotherapy, patient/family education, energy conservation, coping strategies training, and DME and/or AE instructions  RECOMMENDED OTHER SERVICES: Patient had outpt PT evaluation  today also.  CONSULTED AND AGREED WITH PLAN OF CARE: Patient and family member/caregiver  PLAN FOR NEXT SESSION:   Progress HEPs for putty (pinch to improve grasp for tying laces), and review/add theraband HEP for strengthening.  Introduce UE ROM exercises ie) table top slides.  Begin Coordination HEP activities.   Victorino Sparrow, OT 10/21/2022, 5:15 PM

## 2022-10-21 NOTE — Therapy (Signed)
OUTPATIENT PHYSICAL THERAPY NEURO TREATMENT   Patient Name: Kenneth Flores MRN: [id number] DOB:05-May-1960, 62 y.o., male Today's Date: 10/21/2022   PCP: Norm Salt, [place] REFERRING PROVIDER: Milinda Antis, PA-C  END OF SESSION:  PT End of Session - 10/21/22 1408     Visit Number 4    Number of Visits 17   16 + eval   Date for PT Re-Evaluation 11/27/22   pushed out due to delay in scheduling   Authorization Type [employer] ULTRA    PT Start Time 1404    PT Stop Time 1447    PT Time Calculation (min) 43 min    Equipment Utilized During Treatment Gait belt    Activity Tolerance Patient tolerated treatment well    Behavior During Therapy WFL for tasks assessed/performed              Past Medical History:  Diagnosis Date   Hepatitis C    Past Surgical History:  Procedure Laterality Date   ANTERIOR CERVICAL DECOMP/DISCECTOMY FUSION N/A 08/21/2022   Procedure: ANTERIOR CERVICAL DECOMPRESSION/DISCECTOMY FUSION CERVICAL THREE-FOUR, CERVICAL FOUR-FIVE, CERVICAL FIVE-SIX;  Surgeon: Julio Sicks, MD;  Location: [facility] OR;  Service: Neurosurgery;  Laterality: N/A;   PELVIC FRACTURE SURGERY Right    Patient Active Problem List   Diagnosis Date Noted   Neurogenic bowel 10/16/2022   Spasticity 10/16/2022   Abnormality of gait 10/16/2022   Acquired left foot drop 10/16/2022   Chronic pain due to trauma 10/16/2022   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) 09/09/2022   Adjustment disorder with depressed mood 08/31/2022   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) 08/27/2022   Cervical spinal cord injury (HCC) 08/19/2022   Chronic hepatitis C without hepatic coma (HCC) 09/08/2021   Thrombocytopenia (HCC) 09/08/2021    ONSET DATE: 08/20/2022 (surgery date)  REFERRING DIAG: 09/16/2022  THERAPY DIAG:  Other lack of coordination  Muscle weakness (generalized)  Pain in left arm  Other muscle spasm  Unsteadiness on feet  Other abnormalities of gait and  mobility  Rationale for Evaluation and Treatment: Rehabilitation  SUBJECTIVE:                                                                                                                                                                                             SUBJECTIVE STATEMENT: Patient presents using rollator without cervical collar on.  He states he has had no falls or near falls since last visit.   Pt accompanied by: significant other-[name] (not present during session)  PERTINENT HISTORY: Chronic Hep C  Patient had syncopal episode in bathroom at home when urinating  on 08/19/2022.  When he fell backwards he hit his head.  On 5/30, he was taken to the OR by Dr. Jordan Likes for C3-C6 anterior cervical discectomy and interbody fusion with anterior plate instrumentation secondary to multilevel cervical degenerative disease with disc space collapse, spondylosis and critical spinal stenosis with severe cord compression and signal change at level C3-4.  PAIN:  Are you having pain? Yes: NPRS scale: 6/10 Pain location: between shoulders blades Pain description: vibrations and tingling Aggravating factors: random, overworking the arm Relieving factors: pain medicines  PRECAUTIONS: Cervical and Fall-per Dr. Jordan Likes in-basket 7/24 pt has no restrictions.  WEIGHT BEARING RESTRICTIONS: No  FALLS: Has patient fallen in last 6 months? Yes. Number of falls 1-fall that caused surgical need  LIVING ENVIRONMENT: Lives with: lives with their spouse Lives in: House/apartment Stairs: Yes: Internal: 14 steps; on left going up and half of stairs have bilateral rails Has following equipment at home: Single point cane, Shower bench, and Grab bars  PLOF: Requires assistive device for independence, Needs assistance with ADLs, Needs assistance with homemaking, Needs assistance with gait, and Needs assistance with transfers  PATIENT GOALS: "To try to get better"  OBJECTIVE:   DIAGNOSTIC FINDINGS:  MRI  cervical spine 5/29: IMPRESSION: 1. Positive for evidence of both Ligamentous Injury, and Abnormal Cervical Spinal Cord in the setting of trauma: - Prevertebral edema or fluid from C1 through C6. Consider anterior longitudinal ligament injury, although no discrete ligamentous disruption is identified. - mild posterior interspinous ligamentous injury at C3-C4 eccentric to the right. - Abnormal spinal cord signal from C3 to the C5 cord level. While some of this most resembles chronic Myelomalacia (see #2), a component of Acute Cord Contusion cannot be excluded - especially in the left central cord at C3.   *Underlying bulky cervical spine degeneration with spinal stenosis AND spinal cord mass effect from C3-C4 (Moderate) through C5-C6 (mild). Associated moderate to severe degenerative neural foraminal stenosis at the bilateral C4, C5, and left C6 nerve levels.   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  COGNITION: Overall cognitive status:  Wife answers for pt primarily, pt appears to have delayed processing and requires repetition of questions.   SENSATION: Light touch: WFL and pt reporting tingling all the way down the left leg  COORDINATION: LE RAMS:  slow and deliberate BLE heel-to-shin:  limited due to functional weakness  EDEMA:  None noted in BLE, pt and wife deny swelling at home, he is wearing bilateral compression stockings for DVT prevention per wife-she states she will ask MD if these are necessary any longer.  MUSCLE TONE: Questionable left clonus  POSTURE: forward head-wearing soft cervical collar  LOWER EXTREMITY ROM/MMT:     Active  Right Eval Left Eval  Hip flexion 3+/5 2+/5  Hip extension    Hip abduction    Hip adduction    Hip internal rotation    Hip external rotation    Knee flexion    Knee extension 4-/5 3/5  Ankle dorsiflexion 4/5 3/5  Ankle plantarflexion    Ankle inversion    Ankle eversion      (Blank rows = not tested)  BED MOBILITY:  Sit to supine Complete Independence Supine to sit Complete Independence Rolling to Right Complete Independence Rolling to Left Complete Independence *Pain with rolling  TRANSFERS: Assistive device utilized: Single point cane  Sit to stand: SBA Stand to sit: SBA Chair to chair: SBA  GAIT: Gait pattern: step  through pattern, decreased arm swing- Left, decreased step length- Left, decreased stride length, and narrow BOS Distance walked: various clinic distances Assistive device utilized: Single point cane Level of assistance: SBA  FUNCTIONAL TESTS:  5 times sit to stand: 27.91 seconds w/ BUE support 10 meter walk test: 14.82 seconds w/ SPC = 0.67 m/sec OR 2.23 ft/sec Berg Balance Scale: To be assessed.  PATIENT SURVEYS:  None completed due to time.  TODAY'S TREATMENT:                                                                                                                              DATE: 10/21/2022 -SciFit level 1.0 progressed to level 3.0 using BUE/BLE (intermittent LE only due to LUE tightness) over 8 minutes for neural priming and reciprocal mobility.  Pt reports stretching sensation in legs.  GAIT: Gait pattern: step through pattern, decreased stride length, knee flexed in stance- Right, knee flexed in stance- Left, trunk flexed, poor foot clearance- Right, and poor foot clearance- Left Distance walked: 500' Assistive device utilized: Environmental consultant - 4 wheeled Level of assistance: CGA Comments: Patient has tendency to take right hand off rollator handle when illustrating talking subject to therapist.  Cued for safety.  Cued to increase knee flexion during swing as pt has bilateral foot scuff intermittently.  He manages sidewalk cracks without LOB and self-corrects when going into mild forward flexion/crouched gait and states he has been practicing this correction at home.  STAIRS:  Level of Assistance: SBA  Stair Negotiation  Technique: Step to Pattern Alternating Pattern  Forwards with Bilateral Rails  Number of Stairs: 3x4   Height of Stairs: 6"  Comments: Patient performs initial ascent only with step to pattern, educated that this is likely safest for patient especially if only one rail is available with stairs encountered in community or at friends/relatives homes.  He does well with reciprocal pattern only have single instance of poor heel clearance on descent with self-correction before therapist provided cue.  Educated him on taking increased time on descent to allow for quad activation and limit knee wobble in initial stance.   -Advance retreat forward alternating and laterally x2 ft gap for wide stride positions performed x20 reps in each of 3 directions progressing to no UE support  PATIENT EDUCATION: Education details: Continue HEP, confirmed with pt that wife tried scheduling 2x per week and was somewhat limited by availability w/ multi-disciplinary scheduling, informed patient that we can ask for more visits at end of current POC as needed so not to worry regarding ongoing scheduling.  Discussed plan to focus on neck, balance, LE strength, and other functional deficits in each session so activities will vary in focus and he is okay with this. Person educated: Patient and Spouse-in lobby (spoke to her at end of session) Education method: Explanation Education comprehension: verbalized understanding and needs further education  HOME EXERCISE PROGRAM: Access Code: [email or web address] URL: [email or web address] Date: 10/09/2022 Prepared by: Camille Bal  Exercises - Side Stepping with Resistance at Thighs and Counter Support  - 1 x daily - 5 x weekly - 3 sets - 10 reps - Standing Tandem Balance with Counter Support  - 1 x daily - 5 x weekly - 1 sets - 4 reps - 30 seconds hold - Sit to Stand with Resistance Around Legs  - 1 x daily - 5 x weekly - 1-2 sets - 10 reps - Heel Raises with Counter  Support  - 1 x daily - 5 x weekly - 2 sets - 12 reps - Supine Chin Tuck  - 1 x daily - 5 x weekly - 3 sets - 10 reps   You Can Walk For A Certain Length Of Time Each Day (use rollator for safety and have wife nearby for supervision)                          Walk 4-5 minutes 2-3 times per day.             Increase 2-3  minutes every week.             Work up to 20 minutes continuously once per day.               Example:                         Day 1-2           4-5 minutes     3 times per day                         Day 7-8           10-12 minutes 2-3 times per day                         Day 13-14       20-22 minutes 1-2 times per day  GOALS: Goals reviewed with patient? Yes  SHORT TERM GOALS: Target date: 10/23/2022  Pt will be independent and compliant with introductory strength and balance HEP in order to maintain functional progress and improve mobility. Baseline:  To be established. Goal status: INITIAL  2.  Pt will decrease 5xSTS to </=22.91 seconds in order to demonstrate decreased risk for falls and improved functional bilateral LE strength and power. Baseline: 27.91 seconds w/ BUE support Goal status: INITIAL  3.  Pt will demonstrate a gait speed of >/=0.8 m/sec in order to decrease risk for falls. Baseline: 0.67 m/sec Goal status: INITIAL  4.  Pt will increase BERG balance score to >/=40/56 to demonstrate improved static balance. Baseline: 36/56 (7/19) Goal status: INITIAL  LONG TERM GOALS: Target date: 11/20/2022  Pt will be independent and compliant with advanced strength and balance HEP in order to maintain functional progress and improve mobility. Baseline:  To be established. Goal status: INITIAL  2.  Pt will decrease 5xSTS to </=17.91 seconds in order to demonstrate decreased risk for falls and improved functional bilateral LE strength and power. Baseline: 27.91 seconds w/ BUE support Goal status: INITIAL  3.  Pt will demonstrate a gait speed of >/=0.93  m/sec in order to decrease risk for falls. Baseline: 0.67 m/sec Goal status: INITIAL  4.  Pt will increase BERG balance score to >/=44/56 to demonstrate improved static balance. Baseline: 36/56 (7/19) Goal status: INITIAL  5.  Pt will ambulate >/=500 feet independently over unlevel surface to promote household and community access. Baseline: inside clinic level distance w/ SPC SBA Goal status: INITIAL  6.  Patient will report average 7 day pain of </=5/10 at rest in order to demonstrate improved quality of life. Baseline: 7/10 shoulders and left arm at rest Goal status: INITIAL  ASSESSMENT:  CLINICAL IMPRESSION: Continued gait training on stairs and over unlevel ground today.  He is able to perform reciprocal steps with some cuing for safety on descent.  He does well over unlevel sidewalk, but needs reminders to maintain bilateral grip on handles for safest utilization of AD.  His overall mobility is most limited by endurance at current.  As PT is able to progress tolerance to activity patient should be able to see marked improvements in balance and strength.  Will continue per POC.  OBJECTIVE IMPAIRMENTS: Abnormal gait, decreased activity tolerance, decreased balance, decreased cognition, decreased coordination, decreased endurance, decreased knowledge of condition, difficulty walking, decreased ROM, decreased strength, impaired sensation, impaired tone, improper body mechanics, postural dysfunction, and pain.   ACTIVITY LIMITATIONS: carrying, lifting, bending, standing, squatting, stairs, transfers, bed mobility, bathing, reach over head, and locomotion level  PARTICIPATION LIMITATIONS: meal prep, cleaning, laundry, medication management, driving, shopping, community activity, occupation, and yard work  PERSONAL FACTORS: Age, Fitness, Past/current experiences, Transportation, and 1 comorbidity: chronic Hep C  are also affecting patient's functional outcome.   REHAB POTENTIAL:  Good  CLINICAL DECISION MAKING: Evolving/moderate complexity  EVALUATION COMPLEXITY: Moderate  PLAN:  PT FREQUENCY: 1-2x/week (scheduling for 1x/wk initially until out of cervical collar)  PT DURATION: 8 weeks  PLANNED INTERVENTIONS: Therapeutic exercises, Therapeutic activity, Neuromuscular re-education, Balance training, Gait training, Patient/Family education, Self Care, Stair training, Vestibular training, Orthotic/Fit training, DME instructions, scar mobilization, Manual therapy, and Re-evaluation  PLAN FOR NEXT SESSION: ASSESS STGs!  Modify strength and balance HEP prn.   Gait training w/ vs w/o SPC vs rollator for long/unlevel.  Endurance.  Cervical isometrics, try seated cervical AROM-add to HEP. LLE NMR-leg press, hurdles, unsupported static balance, periscapular strength  Sadie Haber, PT, DPT 10/21/2022, 3:02 PM

## 2022-10-21 NOTE — Patient Instructions (Addendum)
Added table top shoulder ROM to HEP  Access Code: [email or web address] URL: [email or web address] Date: 10/21/2022 Prepared by: Amada Kingfisher  - Seated Shoulder Flexion Towel Slide at Table Top  - 2 x daily - 10 reps  Setup Begin sitting facing a table or counter top with your hand resting flat on a towel.  Movement Slowly lean forward to slide your hand and towel across the table. Return to the starting position and repeat. Progress to being able to lift your arm off the table when leaning forward.  Tip Try to avoid shrugging your shoulder during the exercise and make sure your back does not hurt or tingle.

## 2022-10-22 ENCOUNTER — Other Ambulatory Visit (HOSPITAL_BASED_OUTPATIENT_CLINIC_OR_DEPARTMENT_OTHER): Payer: Self-pay

## 2022-10-23 ENCOUNTER — Ambulatory Visit: Payer: Self-pay | Attending: Physician Assistant | Admitting: Occupational Therapy

## 2022-10-23 DIAGNOSIS — M62838 Other muscle spasm: Secondary | ICD-10-CM | POA: Insufficient documentation

## 2022-10-23 DIAGNOSIS — R2681 Unsteadiness on feet: Secondary | ICD-10-CM | POA: Insufficient documentation

## 2022-10-23 DIAGNOSIS — R208 Other disturbances of skin sensation: Secondary | ICD-10-CM | POA: Insufficient documentation

## 2022-10-23 DIAGNOSIS — G8254 Quadriplegia, C5-C7 incomplete: Secondary | ICD-10-CM | POA: Insufficient documentation

## 2022-10-23 DIAGNOSIS — M79602 Pain in left arm: Secondary | ICD-10-CM | POA: Insufficient documentation

## 2022-10-23 DIAGNOSIS — R29898 Other symptoms and signs involving the musculoskeletal system: Secondary | ICD-10-CM | POA: Insufficient documentation

## 2022-10-23 DIAGNOSIS — R278 Other lack of coordination: Secondary | ICD-10-CM | POA: Insufficient documentation

## 2022-10-23 DIAGNOSIS — M6281 Muscle weakness (generalized): Secondary | ICD-10-CM | POA: Insufficient documentation

## 2022-10-23 DIAGNOSIS — R2689 Other abnormalities of gait and mobility: Secondary | ICD-10-CM | POA: Insufficient documentation

## 2022-10-23 NOTE — Therapy (Signed)
OUTPATIENT OCCUPATIONAL THERAPY NEURO TREATMENT  Patient Name: Kenneth Flores MRN: [id number] DOB:1960/05/11, 62 y.o., male Today's Date: 10/23/2022  PCP: Norm Salt, [place] REFERRING PROVIDER: Milinda Antis, PA-C  END OF SESSION:  OT End of Session - 10/23/22 1235     Visit Number 4    Number of Visits 16   + Evaluation   Date for OT Re-Evaluation 11/27/22    Authorization Type [employer] 2024 VL: 60 (PT/OT/ST)    OT Start Time 1237    OT Stop Time 1315    OT Time Calculation (min) 38 min    Equipment Utilized During Treatment Putty    Activity Tolerance Patient tolerated treatment well    Behavior During Therapy WFL for tasks assessed/performed             Past Medical History:  Diagnosis Date   Hepatitis C    Past Surgical History:  Procedure Laterality Date   ANTERIOR CERVICAL DECOMP/DISCECTOMY FUSION N/A 08/21/2022   Procedure: ANTERIOR CERVICAL DECOMPRESSION/DISCECTOMY FUSION CERVICAL THREE-FOUR, CERVICAL FOUR-FIVE, CERVICAL FIVE-SIX;  Surgeon: Julio Sicks, MD;  Location: [facility] OR;  Service: Neurosurgery;  Laterality: N/A;   PELVIC FRACTURE SURGERY Right    Patient Active Problem List   Diagnosis Date Noted   Neurogenic bowel 10/16/2022   Spasticity 10/16/2022   Abnormality of gait 10/16/2022   Acquired left foot drop 10/16/2022   Chronic pain due to trauma 10/16/2022   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) 09/09/2022   Adjustment disorder with depressed mood 08/31/2022   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) 08/27/2022   Cervical spinal cord injury (HCC) 08/19/2022   Chronic hepatitis C without hepatic coma (HCC) 09/08/2021   Thrombocytopenia (HCC) 09/08/2021    ONSET DATE: Referral: 09/17/2022 Hospitalized 08/19/22  REFERRING DIAG: G82.54 (ICD-10-CM) - Quadriplegia, C5-C7 incomplete  THERAPY DIAG:  Muscle weakness (generalized)  Other lack of coordination  Pain in left arm  Other symptoms and signs involving the musculoskeletal  system  Rationale for Evaluation and Treatment: Rehabilitation  SUBJECTIVE:   SUBJECTIVE STATEMENT:  Patient states he is able to view his HEP on his phone for [employer] and has a "therapy bucket" of items he uses while sitting in his recliner.    Pt accompanied by: self   PERTINENT HISTORY:   Hx of chronic Hep C, status post micturition related syncope from vasovagal episode at home on 08/19/2022 resulting in fall with blow to head. Imaging revealed evidence of significant multilevel cervical degenerative disease with associated disc space collapse and associated spondylosis. Patient with critical spinal stenosis with severe cord compression and cord signal change at C3-4. Severe stenosis at C4-5 and C5-6 without marked cord signal abnormality at these levels. There is some changes posteriorly from C3-C6 with some worrisome findings for some posterior ligamentous injury. There was no evidence of fracture on his CT scan. There is no evidence of malalignment. Neurosurgery consulted and admitted the patient for IV steroids and pain control. On 5/30, he was taken to the OR and underwent C3-4, C4-5, C5-6 anterior cervical discectomy with interbody fusion utilizing interbody cages, local harvested autograft, and anterior plate instrumentation. Motor function improved.   In patient Rehab 08/27/2022 - 09/16/2022   Discharge Diagnoses:  Principal Problem:   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-6 level [facility]) Active Problems:   Adjustment disorder with depressed mood Active problems: Functional deficits secondary to incomplete spinal cord lesion at C5-C7 level Constipation History of chronic hepatitis C Neurogenic bladder with urinary retention Neurogenic bowel Hypertension  PRECAUTIONS: Cervical and Other: soft collar when moving/walking but takes it off at rest and when sleeping  WEIGHT BEARING RESTRICTIONS: No  PAIN:  Are you having pain? Yes: NPRS scale: 6/10 Pain location: L arm, shoulder and  across shoulder/mid-back Pain description: throbbing (leg) vibrating Aggravating factors: Overworking it, doing too much Relieving factors: rest/relaxing, oral medication 4x/day, heating pad sometimes   FALLS: Has patient fallen in last 6 months? Yes. Number of falls Previously 2 related to same vasovagal issues - the first fall without injury was a couple of weeks prior to the second fall when he hit his head on the sink   Additional fall around 10/06/22 when his legs did a split while trying to walk at home  LIVING ENVIRONMENT: Lives with: lives with their spouse - [name] Lives in: House/apartment Stairs: Yes: Internal: 14 steps; on right going up, on left going up, and only half way up on the R side; wife walks with him upstairs to the bedrooms Has following equipment at home: Single point cane, Shower bench, and Grab bars  PLOF: Independent, driving, worked as Administrator (x40 hours/week)  PATIENT GOALS: Get my L arm better, tie shoelaces tighter so they stay tied  OBJECTIVE:   HAND DOMINANCE: Right  ADLs: Overall ADLs: Min to mod assist Transfers/ambulation related to ADLs: SBA with cane Eating: Needs help to cut his food Grooming: He tries to shave himself but gets help for the things he can't reach/get to on his own. UB Dressing: He can dress himself with simple clothes but needs extra time but needs help with fasteners LB Dressing: Needs help to get TED hose on and tie shoelaces Toileting: MI but wife is still monitoring him Bathing: Wife assists with his back and is present during showers Tub Shower transfers: Tub transfer bench - wife is there for extra support due to him being wobbly and has been trying to step into the tub with grab bars rather than sitting on the bench all the time Equipment: Transfer tub bench and Grab bars  IADLs: Shopping: Wife is performing shopping. Light housekeeping: Dependent on family ie) Not able to safely or effectively help take out the trash,  mow the lawn etc. Meal Prep: Wife performs this.  He did help with this a little previously. Community mobility: Uses cane with supervision Medication management: No meds previously but now 4x/day.  Using pill box and patient did sort them himself the last time but his wife is checking to make sure they are accurate and calls him to remind him to take his medication as needed (ie if she is working Catering manager). Financial management: Wife is helping more than she needed to previously Handwriting: 50% legible - Patient reports his current writing is not as good as before (about 50% compared to before) but it has improved significantly from when he had to mark/sign with an "X" only.    Patient is never home alone (if his wife is at work, his stepson will be there).  He stays on the bottom level of the home during the daytime and is off the narcotic in the daytime.  He uses his cane in the house  MOBILITY STATUS: Needs Assist: supervision for safety  POSTURE COMMENTS:  forward head Sitting balance: WFL  ACTIVITY TOLERANCE: Activity tolerance: Decreased.  Patient reports falling asleep/napping daily at this time (combo of medication etc)  FUNCTIONAL OUTCOME MEASURES: Quick Dash: 88.6  (see eval report for complete list of limitations).  UPPER EXTREMITY ROM:  Active ROM Right eval Left eval  Shoulder flexion 100 90  Shoulder abduction 90 75  Shoulder adduction    Shoulder extension Limited end range   Shoulder internal rotation   Shoulder external rotation    Elbow flexion Limited end range  Elbow extension Decreased end range (L side spasmed with stretch  Wrist flexion    Wrist extension    Wrist ulnar deviation    Wrist radial deviation    Wrist pronation    Wrist supination    Digital extension Decreased end range and slow to open   (Blank rows = not formally tested at eval)  UPPER EXTREMITY MMT:     MMT Right eval Left eval  Shoulder flexion 3- 3-  Shoulder abduction     Shoulder adduction    Shoulder extension    Shoulder internal rotation    Shoulder external rotation    Middle trapezius    Lower trapezius    Elbow flexion 3 3-  Elbow extension    Wrist flexion    Wrist extension    Wrist ulnar deviation    Wrist radial deviation    Wrist pronation    Wrist supination    (Blank rows = not formally tested at eval)  HAND FUNCTION: Grip strength: Right: 21.1, 18.2, 20.0 lbs; Left: 8.5, 5.7, 7.4  lbs Evaluation: Right Avg 19.8 lbs Left Avg 7.2 lbs  COORDINATION: 9 Hole Peg test: Right: 45.22  sec; Left: 1:40.28  sec Box and Blocks:  Right 41 blocks, Left 31 blocks  SENSATION: Not tested  EDEMA: None noted in UEs  MUSCLE TONE: Patient has some stiffness with ROM  COGNITION: Overall cognitive status: Within functional limits for tasks assessed BIMS from hospital (08/2022) 15/15  VISION: Subjective report: No changes since accident Baseline vision: Bifocals Visual history: NA  VISION ASSESSMENT: Not tested  Patient has no changes or difficulty with any activities due to vision.  PERCEPTION: Not tested  PRAXIS: Not tested  EVALUATION OBSERVATIONS: Patient seen following physical therapy services this afternoon.  He arrived ambulating with a single-point cane.  Patient is accompanied by his wife.  Patient is soft-spoken and noted to refer to his wife for answers throughout session.  He has noted to have limitations in bilateral upper extremity range of motion left greater than right with limited success with overhead reaching and extended time for fine motor tasks with both sides but particularly left upper extremity.   TODAY'S TREATMENT:                                                                                                                              DATE:   Therapeutic Exercises:  OT initiated RUE and LUE coordination HOME EXERCISE PROGRAM as noted in patient instructions including pick up and placement of items,  shuffling and turning cards, item stacking and unstacking, rolling a piece of tissue paper into a ball, rolling golf balls in hand, rolling a  pen in between fingers and thumb, picking up and storing items in hand,  and then translating stored items to tips of thumb and index finger before placing them individually into a container. Patient able to return demonstration of all exercises. Handout provided.   In sidelying, pt completed shoulder flexion ROM with hand over hand assistance to maintain extended elbow and provide oscillations to help with pain. Process repeated on opposite side.   PATIENT EDUCATION: Education details: Patent attorney; Shoulder ROM in gravity eliminated position Person educated: Patient Education method: Explanation, Demonstration, Tactile cues, Verbal cues, and Handouts Education comprehension: verbalized understanding, returned demonstration, verbal cues required, tactile cues required, and needs further education  HOME EXERCISE PROGRAM: 10/14/22: Putty Exercises - [email or web address]  Access Code: [email or web address] 10/21/22: Added Table Top slide to HEP - Same Access code 10/23/2022: coordination HEP   GOALS: Goals reviewed with patient? Yes   SHORT TERM GOALS: Target date: 10/30/22  Patient will improve overhead reaching > 100 degrees for B shoulders to assist with ADLs. Baseline: R 100* L 90* Goal status: IN PROGRESS  2.  Patient will demonstrate at least 30 lbs RUE and 15 lbs LUE grip strength as needed to open containers and carry lightweight objects without dropping them.  Baseline: Right Avg 19.8 lbs Left Avg 7.2 lbs Goal status: IN PROGRESS  3.  Patient will complete nine-hole peg with use of RUE in 35 seconds or less and LUE 75 seconds or less.  Baseline: 9 Hole Peg test: Right: 45.22  sec; Left: 1:40.28  sec Goal status: INITIAL  4.  Patient will demonstrate UE strengthening HEP (putty/theraband etc) with 25% verbal cues or less for proper  execution. Baseline: Has various materials from hospital. Goal status: IN PROGRESS  5.  Patient will demonstrate good digital A/AROM HEP to minimize stiffness in hands (esp L) which impacts coordination ie) in hand manipulation of small objects. Baseline: Stiff digital extension with decreased end range and difficulty with managing pegs for 9 hole peg test ie) dropped items etc.  Goal status: IN PROGRESS  LONG TERM GOALS: Target date: 11/27/22  Patient will demonstrate UE strength and coordination to cut various foods using appropriate AE if necessary. Baseline: Unable to cut food/Assisted by family Goal status: IN PROGRESS  2.  Patient will demonstrate UE pinch strength and coordination to tie shoelaces tightly. Baseline: Shoelaces frequently unfasten Goal status: IN PROGRESS  3.  Patient will demonstrate at least 40 lbs RUE and 20 lbs LUE grip strength as needed to open containers and carry lightweight objects.  Baseline: Right Avg 19.8 lbs Left Avg 7.2 lbs Goal status: IN PROGRESS  4.  Patient will complete nine-hole peg with use of RUE in 35 seconds or less and LUE 75 seconds or less.  Baseline: 9 Hole Peg test: Right: 45.22  sec; Left: 1:40.28  sec Goal status: INITIAL  5.  Patient will be able to complete simple food prep activities with good activity tolerance > 15 minutes and safety (ie. Decreased fall risk and implementation of safety precautions). Baseline: Family does all meal prep Goal status: INITIAL  6. Patient will demonstrate at least 20% improvement with quick Dash score (reporting <68 % disability or less) indicating improved functional use of affected extremity.   Baseline: 88.6% (10/11 categories as Severe/Unable to perform.  Goal Status: INITIAL   ASSESSMENT:  CLINICAL IMPRESSION: Pt demonstrates willingness to participate in therapy today; however, requires cues to stay focused on task. Recommend B shoulder ROM in sidelying and/or use  of E-stim to improve  AROM and pain.   PERFORMANCE DEFICITS: in functional skills including ADLs, IADLs, coordination, dexterity, proprioception, sensation, tone, ROM, strength, pain, muscle spasms, flexibility, Fine motor control, Gross motor control, mobility, balance, endurance, continence, decreased knowledge of use of DME, and UE functional use, cognitive skills including energy/drive, safety awareness, and thought, and psychosocial skills including coping strategies and routines and behaviors.   IMPAIRMENTS: are limiting patient from ADLs, IADLs, rest and sleep, work, leisure, and social participation.   CO-MORBIDITIES: may have co-morbidities  that affects occupational performance. Patient will benefit from skilled OT to address above impairments and improve overall function.  REHAB POTENTIAL: Good   PLAN:  OT FREQUENCY: 2x/week  OT DURATION: 8 weeks  PLANNED INTERVENTIONS: self care/ADL training, therapeutic exercise, therapeutic activity, neuromuscular re-education, manual therapy, balance training, functional mobility training, splinting, fluidotherapy, patient/family education, energy conservation, coping strategies training, and DME and/or AE instructions  RECOMMENDED OTHER SERVICES: Patient had outpt PT evaluation today also.  CONSULTED AND AGREED WITH PLAN OF CARE: Patient and family member/caregiver  PLAN FOR NEXT SESSIONs:   Review Coordination activities with handout for HEP  Review sidelying shoulder flex  Progress HEPs for putty (pinch to improve grasp for tying laces), and eventually add theraband HEP for UE strengthening.  Progress UE ROM exercises ie) from table top slides to wall slides   Delana Meyer, OT 10/23/2022, 1:04 PM

## 2022-10-28 ENCOUNTER — Encounter: Payer: Self-pay | Admitting: Physical Therapy

## 2022-10-28 ENCOUNTER — Ambulatory Visit: Payer: Self-pay | Admitting: Physical Therapy

## 2022-10-28 ENCOUNTER — Ambulatory Visit: Payer: Self-pay | Admitting: Occupational Therapy

## 2022-10-28 DIAGNOSIS — R2681 Unsteadiness on feet: Secondary | ICD-10-CM

## 2022-10-28 DIAGNOSIS — R208 Other disturbances of skin sensation: Secondary | ICD-10-CM

## 2022-10-28 DIAGNOSIS — M6281 Muscle weakness (generalized): Secondary | ICD-10-CM

## 2022-10-28 DIAGNOSIS — R278 Other lack of coordination: Secondary | ICD-10-CM

## 2022-10-28 DIAGNOSIS — R2689 Other abnormalities of gait and mobility: Secondary | ICD-10-CM

## 2022-10-28 NOTE — Therapy (Signed)
OUTPATIENT PHYSICAL THERAPY NEURO TREATMENT   Patient Name: Kenneth Flores MRN: [id number] DOB:1960/06/16, 62 y.o., male Today's Date: 10/28/2022   PCP: Norm Salt, [place] REFERRING PROVIDER: Milinda Antis, PA-C  END OF SESSION:  PT End of Session - 10/28/22 1433     Visit Number 5    Number of Visits 17   16 + eval   Date for PT Re-Evaluation 11/27/22   pushed out due to delay in scheduling   Authorization Type [employer] ULTRA    PT Start Time 1425   PT locked out of EPIC   PT Stop Time 1505    PT Time Calculation (min) 40 min    Equipment Utilized During Treatment Gait belt    Activity Tolerance Patient tolerated treatment well    Behavior During Therapy WFL for tasks assessed/performed              Past Medical History:  Diagnosis Date   Hepatitis C    Past Surgical History:  Procedure Laterality Date   ANTERIOR CERVICAL DECOMP/DISCECTOMY FUSION N/A 08/21/2022   Procedure: ANTERIOR CERVICAL DECOMPRESSION/DISCECTOMY FUSION CERVICAL THREE-FOUR, CERVICAL FOUR-FIVE, CERVICAL FIVE-SIX;  Surgeon: Julio Sicks, MD;  Location: [facility] OR;  Service: Neurosurgery;  Laterality: N/A;   PELVIC FRACTURE SURGERY Right    Patient Active Problem List   Diagnosis Date Noted   Neurogenic bowel 10/16/2022   Spasticity 10/16/2022   Abnormality of gait 10/16/2022   Acquired left foot drop 10/16/2022   Chronic pain due to trauma 10/16/2022   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) 09/09/2022   Adjustment disorder with depressed mood 08/31/2022   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) 08/27/2022   Cervical spinal cord injury (HCC) 08/19/2022   Chronic hepatitis C without hepatic coma (HCC) 09/08/2021   Thrombocytopenia (HCC) 09/08/2021    ONSET DATE: 08/20/2022 (surgery date)  REFERRING DIAG: 09/16/2022  THERAPY DIAG:  Muscle weakness (generalized)  Other lack of coordination  Unsteadiness on feet  Other abnormalities of gait and mobility  Rationale for  Evaluation and Treatment: Rehabilitation  SUBJECTIVE:                                                                                                                                                                                             SUBJECTIVE STATEMENT: Patient received seated on mat table following OT.  He states he has had no falls or near falls since last visit.  His left shoulder is bothering him some. Pt accompanied by: significant other-[name] (not present during session)  PERTINENT HISTORY: Chronic Hep C  Patient had syncopal episode in bathroom at  home when urinating on 08/19/2022.  When he fell backwards he hit his head.  On 5/30, he was taken to the OR by Dr. Jordan Likes for C3-C6 anterior cervical discectomy and interbody fusion with anterior plate instrumentation secondary to multilevel cervical degenerative disease with disc space collapse, spondylosis and critical spinal stenosis with severe cord compression and signal change at level C3-4.  PAIN:  Are you having pain? Yes: NPRS scale: 5/10 Pain location: left shoulder Pain description: vibrations and tingling Aggravating factors: random, overworking the arm Relieving factors: pain medicines  PRECAUTIONS: Cervical and Fall-per Dr. Jordan Likes in-basket 7/24 pt has no restrictions.  WEIGHT BEARING RESTRICTIONS: No  FALLS: Has patient fallen in last 6 months? Yes. Number of falls 1-fall that caused surgical need  LIVING ENVIRONMENT: Lives with: lives with their spouse Lives in: House/apartment Stairs: Yes: Internal: 14 steps; on left going up and half of stairs have bilateral rails Has following equipment at home: Single point cane, Shower bench, and Grab bars  PLOF: Requires assistive device for independence, Needs assistance with ADLs, Needs assistance with homemaking, Needs assistance with gait, and Needs assistance with transfers  PATIENT GOALS: "To try to get better"  OBJECTIVE:   DIAGNOSTIC FINDINGS:  MRI  cervical spine 5/29: IMPRESSION: 1. Positive for evidence of both Ligamentous Injury, and Abnormal Cervical Spinal Cord in the setting of trauma: - Prevertebral edema or fluid from C1 through C6. Consider anterior longitudinal ligament injury, although no discrete ligamentous disruption is identified. - mild posterior interspinous ligamentous injury at C3-C4 eccentric to the right. - Abnormal spinal cord signal from C3 to the C5 cord level. While some of this most resembles chronic Myelomalacia (see #2), a component of Acute Cord Contusion cannot be excluded - especially in the left central cord at C3.   *Underlying bulky cervical spine degeneration with spinal stenosis AND spinal cord mass effect from C3-C4 (Moderate) through C5-C6 (mild). Associated moderate to severe degenerative neural foraminal stenosis at the bilateral C4, C5, and left C6 nerve levels.   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  COGNITION: Overall cognitive status:  Wife answers for pt primarily, pt appears to have delayed processing and requires repetition of questions.   SENSATION: Light touch: WFL and pt reporting tingling all the way down the left leg  COORDINATION: LE RAMS:  slow and deliberate BLE heel-to-shin:  limited due to functional weakness  EDEMA:  None noted in BLE, pt and wife deny swelling at home, he is wearing bilateral compression stockings for DVT prevention per wife-she states she will ask MD if these are necessary any longer.  MUSCLE TONE: Questionable left clonus  POSTURE: forward head-wearing soft cervical collar  LOWER EXTREMITY ROM/MMT:     Active  Right Eval Left Eval  Hip flexion 3+/5 2+/5  Hip extension    Hip abduction    Hip adduction    Hip internal rotation    Hip external rotation    Knee flexion    Knee extension 4-/5 3/5  Ankle dorsiflexion 4/5 3/5  Ankle plantarflexion    Ankle inversion    Ankle eversion      (Blank rows = not tested)  BED MOBILITY:  Sit to supine Complete Independence Supine to sit Complete Independence Rolling to Right Complete Independence Rolling to Left Complete Independence *Pain with rolling  TRANSFERS: Assistive device utilized: Single point cane  Sit to stand: SBA Stand to sit: SBA Chair to chair: SBA  GAIT: Gait  pattern: step through pattern, decreased arm swing- Left, decreased step length- Left, decreased stride length, and narrow BOS Distance walked: various clinic distances Assistive device utilized: Single point cane Level of assistance: SBA  FUNCTIONAL TESTS:  5 times sit to stand: 27.91 seconds w/ BUE support 10 meter walk test: 14.82 seconds w/ SPC = 0.67 m/sec OR 2.23 ft/sec Berg Balance Scale: To be assessed.  PATIENT SURVEYS:  None completed due to time.  TODAY'S TREATMENT:                                                                                                                              DATE: 10/28/2022 -SciFit level 1.0 progressed to level 6.0 in hill mode using BLE only over 8 minutes for dynamic warmup. -Verbally reviewed HEP and walking program, both of which the patient states are going "okay", when asked he does endorse that aspects remain challenging.  Does not desire an additional printed copy. -5xSTS:  24.41 seconds w/ BUE support - :  16.00 seconds w/ rollator = 0.63 m/sec OR 2.06 ft/sec -BERG:  [facility] PT Assessment - 10/28/22 1458       Berg Balance Test   Sit to Stand Able to stand  independently using hands    Standing Unsupported Able to stand safely 2 minutes    Sitting with Back Unsupported but Feet Supported on Floor or Stool Able to sit safely and securely 2 minutes    Stand to Sit Sits safely with minimal use of hands    Transfers Able to transfer safely, definite need of hands    Standing Unsupported with Eyes Closed Able to stand 10 seconds with supervision    Standing Unsupported with Feet Together Able  to place feet together independently and stand for 1 minute with supervision    From Standing, Reach Forward with Outstretched Arm Can reach confidently >25 cm (10")    From Standing Position, Pick up Object from Floor Able to pick up shoe, needs supervision    From Standing Position, Turn to Look Behind Over each Shoulder Turn sideways only but maintains balance    Turn 360 Degrees Able to turn 360 degrees safely but slowly    Standing Unsupported, Alternately Place Feet on Step/Stool Able to complete 4 steps without aid or supervision    Standing Unsupported, One Foot in Front Able to plae foot ahead of the other independently and hold 30 seconds    Standing on One Leg Able to lift leg independently and hold equal to or more than 3 seconds    Total Score 42    Berg comment: 42/56 = significant fall risk            Sitting cervical AROM:  -Rotation x20  -Flexion/extension x10  -Lateral flexion (edu on likely limitations) x10 each side  PATIENT EDUCATION: Education details: Reviewed HEP and walking program.  Discussed safety with rollator when approaching sitting or moving from chair to chair.  Progress towards goals.  Discussed continuing to gently move neck to increase AROM as able - added to HEP. Person educated: Patient Education method: Explanation Education comprehension: verbalized understanding and needs further education  HOME EXERCISE PROGRAM: Access Code: [email or web address] URL: [email or web address] Date: 10/09/2022 Prepared by: Camille Bal  Exercises - Side Stepping with Resistance at Thighs and Counter Support  - 1 x daily - 5 x weekly - 3 sets - 10 reps - Standing Tandem Balance with Counter Support  - 1 x daily - 5 x weekly - 1 sets - 4 reps - 30 seconds hold - Sit to Stand with Resistance Around Legs  - 1 x daily - 5 x weekly - 1-2 sets - 10 reps - Heel Raises with Counter Support  - 1 x daily - 5 x weekly - 2 sets - 12 reps - Supine Chin Tuck  - 1 x  daily - 5 x weekly - 3 sets - 10 reps  -Cervical AROM in all planes as tolerated  You Can Walk For A Certain Length Of Time Each Day (use rollator for safety and have wife nearby for supervision)                          Walk 4-5 minutes 2-3 times per day.             Increase 2-3  minutes every week.             Work up to 20 minutes continuously once per day.               Example:                         Day 1-2           4-5 minutes     3 times per day                         Day 7-8           10-12 minutes 2-3 times per day                         Day 13-14       20-22 minutes 1-2 times per day  GOALS: Goals reviewed with patient? Yes  SHORT TERM GOALS: Target date: 10/23/2022  Pt will be independent and compliant with introductory strength and balance HEP in order to maintain functional progress and improve mobility. Baseline:  Patient compliant (8/7) Goal status: MET  2.  Pt will decrease 5xSTS to </=22.91 seconds in order to demonstrate decreased risk for falls and improved functional bilateral LE strength and power. Baseline: 27.91 seconds w/ BUE support; 24.41 seconds w/ BUE support (8/7) Goal status: IN PROGRESS  3.  Pt will demonstrate a gait speed of >/=0.8 m/sec in order to decrease risk for falls. Baseline: 0.67 m/sec; 0.63 m/sec OR 2.06 ft/sec w/ rollator (8/7) Goal status: NOT MET  4.  Pt will increase BERG balance score to >/=40/56 to demonstrate improved static balance. Baseline: 36/56 (7/19); 42/56 (8/7) Goal status: MET  LONG TERM GOALS: Target date: 11/20/2022  Pt will be independent and compliant with advanced strength and balance HEP in order to maintain functional progress and improve mobility. Baseline:  To be established. Goal status: INITIAL  2.  Pt will decrease 5xSTS to </=17.91 seconds in order  to demonstrate decreased risk for falls and improved functional bilateral LE strength and power. Baseline: 27.91 seconds w/ BUE support Goal status:  INITIAL  3.  Pt will demonstrate a gait speed of >/=0.93 m/sec in order to decrease risk for falls. Baseline: 0.67 m/sec Goal status: INITIAL  4.  Pt will increase BERG balance score to >/=44/56 to demonstrate improved static balance. Baseline: 36/56 (7/19) Goal status: INITIAL  5.  Pt will ambulate >/=500 feet independently over unlevel surface to promote household and community access. Baseline: inside clinic level distance w/ SPC SBA Goal status: INITIAL  6.  Patient will report average 7 day pain of </=5/10 at rest in order to demonstrate improved quality of life. Baseline: 7/10 shoulders and left arm at rest Goal status: INITIAL  ASSESSMENT:  CLINICAL IMPRESSION: Assessed STGs this session with patient demonstrating progress towards all but one goal.  His gait speed remains insignificantly changed at 0.63 m/sec using rollator.  His BERG score improved to 42/56 from prior 36.  He is compliant to his current HEP with recent updates to meet his current functional level.  His 5xSTS, though not improved to goal level, is improving overall demonstrating small strength gains in bilateral lower extremities.  He continues to benefit from skilled PT to progress safety and independence with functional mobility.  OBJECTIVE IMPAIRMENTS: Abnormal gait, decreased activity tolerance, decreased balance, decreased cognition, decreased coordination, decreased endurance, decreased knowledge of condition, difficulty walking, decreased ROM, decreased strength, impaired sensation, impaired tone, improper body mechanics, postural dysfunction, and pain.   ACTIVITY LIMITATIONS: carrying, lifting, bending, standing, squatting, stairs, transfers, bed mobility, bathing, reach over head, and locomotion level  PARTICIPATION LIMITATIONS: meal prep, cleaning, laundry, medication management, driving, shopping, community activity, occupation, and yard work  PERSONAL FACTORS: Age, Fitness, Past/current experiences,  Transportation, and 1 comorbidity: chronic Hep C  are also affecting patient's functional outcome.   REHAB POTENTIAL: Good  CLINICAL DECISION MAKING: Evolving/moderate complexity  EVALUATION COMPLEXITY: Moderate  PLAN:  PT FREQUENCY: 1-2x/week (scheduling for 1x/wk initially until out of cervical collar)  PT DURATION: 8 weeks  PLANNED INTERVENTIONS: Therapeutic exercises, Therapeutic activity, Neuromuscular re-education, Balance training, Gait training, Patient/Family education, Self Care, Stair training, Vestibular training, Orthotic/Fit training, DME instructions, scar mobilization, Manual therapy, and Re-evaluation  PLAN FOR NEXT SESSION: Modify strength and balance HEP prn.   Gait training w/ vs w/o SPC vs rollator for long/unlevel.  Endurance.  Cervical isometrics. LLE NMR-leg press, hurdles, unsupported static balance, periscapular strength  Sadie Haber, PT, DPT 10/28/2022, 3:07 PM

## 2022-10-28 NOTE — Therapy (Signed)
OUTPATIENT OCCUPATIONAL THERAPY NEURO TREATMENT  Patient Name: Kenneth Flores MRN: [id number] DOB:11/04/1960, 62 y.o., male Today's Date: 10/28/2022  PCP: Norm Salt, [place] REFERRING PROVIDER: Milinda Antis, PA-C  END OF SESSION:  OT End of Session - 10/28/22 1317     Visit Number 5    Number of Visits 16   + Evaluation   Date for OT Re-Evaluation 11/27/22    Authorization Type [employer] 2024 VL: 60 (PT/OT/ST)    OT Start Time 1318    OT Stop Time 1400    OT Time Calculation (min) 42 min    Equipment Utilized During Treatment Various Sensory Objects    Activity Tolerance Patient tolerated treatment well    Behavior During Therapy WFL for tasks assessed/performed             Past Medical History:  Diagnosis Date   Hepatitis C    Past Surgical History:  Procedure Laterality Date   ANTERIOR CERVICAL DECOMP/DISCECTOMY FUSION N/A 08/21/2022   Procedure: ANTERIOR CERVICAL DECOMPRESSION/DISCECTOMY FUSION CERVICAL THREE-FOUR, CERVICAL FOUR-FIVE, CERVICAL FIVE-SIX;  Surgeon: Julio Sicks, MD;  Location: [facility] OR;  Service: Neurosurgery;  Laterality: N/A;   PELVIC FRACTURE SURGERY Right    Patient Active Problem List   Diagnosis Date Noted   Neurogenic bowel 10/16/2022   Spasticity 10/16/2022   Abnormality of gait 10/16/2022   Acquired left foot drop 10/16/2022   Chronic pain due to trauma 10/16/2022   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) 09/09/2022   Adjustment disorder with depressed mood 08/31/2022   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) 08/27/2022   Cervical spinal cord injury (HCC) 08/19/2022   Chronic hepatitis C without hepatic coma (HCC) 09/08/2021   Thrombocytopenia (HCC) 09/08/2021    ONSET DATE: Referral: 09/17/2022 Hospitalized 08/19/22  REFERRING DIAG: G82.54 (ICD-10-CM) - Quadriplegia, C5-C7 incomplete  THERAPY DIAG:  Other disturbances of skin sensation  Other lack of coordination  Muscle weakness (generalized)  Rationale for  Evaluation and Treatment: Rehabilitation  SUBJECTIVE:   SUBJECTIVE STATEMENT:  Patient states he has been working on his exercises but he still can't lift his arms above his head in sitting but he can do better lying down like he did during previous OT session.  He still has tingling and numbness in L UE > R UE which often happens when he touches things.     Pt accompanied by: self   PERTINENT HISTORY:   Hx of chronic Hep C, status post micturition related syncope from vasovagal episode at home on 08/19/2022 resulting in fall with blow to head. Imaging revealed evidence of significant multilevel cervical degenerative disease with associated disc space collapse and associated spondylosis. Patient with critical spinal stenosis with severe cord compression and cord signal change at C3-4. Severe stenosis at C4-5 and C5-6 without marked cord signal abnormality at these levels. There is some changes posteriorly from C3-C6 with some worrisome findings for some posterior ligamentous injury. There was no evidence of fracture on his CT scan. There is no evidence of malalignment. Neurosurgery consulted and admitted the patient for IV steroids and pain control. On 5/30, he was taken to the OR and underwent C3-4, C4-5, C5-6 anterior cervical discectomy with interbody fusion utilizing interbody cages, local harvested autograft, and anterior plate instrumentation. Motor function improved.   In patient Rehab 08/27/2022 - 09/16/2022   Discharge Diagnoses:  Principal Problem:   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-6 level [facility]) Active Problems:   Adjustment disorder with depressed mood Active problems: Functional deficits secondary  to incomplete spinal cord lesion at C5-C7 level Constipation History of chronic hepatitis C Neurogenic bladder with urinary retention Neurogenic bowel Hypertension  PRECAUTIONS: Cervical and Other: soft collar when moving/walking but takes it off at rest and when sleeping  WEIGHT  BEARING RESTRICTIONS: No  PAIN:  Are you having pain? Yes: NPRS scale: 6/10 Pain location: L arm, shoulder and across shoulder/mid-back Pain description: throbbing (leg) vibrating Aggravating factors: Overworking it, doing too much Relieving factors: rest/relaxing, oral medication 4x/day, heating pad sometimes   FALLS: Has patient fallen in last 6 months? Yes. Number of falls Previously 2 related to same vasovagal issues - the first fall without injury was a couple of weeks prior to the second fall when he hit his head on the sink   Additional fall around 10/06/22 when his legs did a split while trying to walk at home  LIVING ENVIRONMENT: Lives with: lives with their spouse - [name] Lives in: House/apartment Stairs: Yes: Internal: 14 steps; on right going up, on left going up, and only half way up on the R side; wife walks with him upstairs to the bedrooms Has following equipment at home: Single point cane, Shower bench, and Grab bars  PLOF: Independent, driving, worked as Administrator (x40 hours/week)  PATIENT GOALS: Get my L arm better, tie shoelaces tighter so they stay tied  OBJECTIVE:   HAND DOMINANCE: Right  ADLs: Overall ADLs: Min to mod assist Transfers/ambulation related to ADLs: SBA with cane Eating: Needs help to cut his food Grooming: He tries to shave himself but gets help for the things he can't reach/get to on his own. UB Dressing: He can dress himself with simple clothes but needs extra time but needs help with fasteners LB Dressing: Needs help to get TED hose on and tie shoelaces Toileting: MI but wife is still monitoring him Bathing: Wife assists with his back and is present during showers Tub Shower transfers: Tub transfer bench - wife is there for extra support due to him being wobbly and has been trying to step into the tub with grab bars rather than sitting on the bench all the time Equipment: Transfer tub bench and Grab bars  IADLs: Shopping: Wife is  performing shopping. Light housekeeping: Dependent on family ie) Not able to safely or effectively help take out the trash, mow the lawn etc. Meal Prep: Wife performs this.  He did help with this a little previously. Community mobility: Uses cane with supervision Medication management: No meds previously but now 4x/day.  Using pill box and patient did sort them himself the last time but his wife is checking to make sure they are accurate and calls him to remind him to take his medication as needed (ie if she is working Catering manager). Financial management: Wife is helping more than she needed to previously Handwriting: 50% legible - Patient reports his current writing is not as good as before (about 50% compared to before) but it has improved significantly from when he had to mark/sign with an "X" only.    Patient is never home alone (if his wife is at work, his stepson will be there).  He stays on the bottom level of the home during the daytime and is off the narcotic in the daytime.  He uses his cane in the house  MOBILITY STATUS: Needs Assist: supervision for safety  POSTURE COMMENTS:  forward head Sitting balance: WFL  ACTIVITY TOLERANCE: Activity tolerance: Decreased.  Patient reports falling asleep/napping daily at this time (combo of  medication etc)  FUNCTIONAL OUTCOME MEASURES: Quick Dash: 88.6  (see eval report for complete list of limitations).  UPPER EXTREMITY ROM:    Active ROM Right eval Left eval  Shoulder flexion 100 90  Shoulder abduction 90 75  Shoulder adduction    Shoulder extension Limited end range   Shoulder internal rotation   Shoulder external rotation    Elbow flexion Limited end range  Elbow extension Decreased end range (L side spasmed with stretch  Wrist flexion    Wrist extension    Wrist ulnar deviation    Wrist radial deviation    Wrist pronation    Wrist supination    Digital extension Decreased end range and slow to open   (Blank rows = not  formally tested at eval)  UPPER EXTREMITY MMT:     MMT Right eval Left eval  Shoulder flexion 3- 3-  Shoulder abduction    Shoulder adduction    Shoulder extension    Shoulder internal rotation    Shoulder external rotation    Middle trapezius    Lower trapezius    Elbow flexion 3 3-  Elbow extension    Wrist flexion    Wrist extension    Wrist ulnar deviation    Wrist radial deviation    Wrist pronation    Wrist supination    (Blank rows = not formally tested at eval)  HAND FUNCTION: Grip strength: Right: 21.1, 18.2, 20.0 lbs; Left: 8.5, 5.7, 7.4  lbs Evaluation: Right Avg 19.8 lbs Left Avg 7.2 lbs  COORDINATION: 9 Hole Peg test: Right: 45.22  sec; Left: 1:40.28  sec Box and Blocks:  Right 41 blocks, Left 31 blocks  SENSATION: Not tested  EDEMA: None noted in UEs  MUSCLE TONE: Patient has some stiffness with ROM  COGNITION: Overall cognitive status: Within functional limits for tasks assessed BIMS from hospital (08/2022) 15/15  VISION: Subjective report: No changes since accident Baseline vision: Bifocals Visual history: NA  VISION ASSESSMENT: Not tested  Patient has no changes or difficulty with any activities due to vision.  PERCEPTION: Not tested  PRAXIS: Not tested  EVALUATION OBSERVATIONS: Patient seen following physical therapy services this afternoon.  He arrived ambulating with a single-point cane.  Patient is accompanied by his wife.  Patient is soft-spoken and noted to refer to his wife for answers throughout session.  He has noted to have limitations in bilateral upper extremity range of motion left greater than right with limited success with overhead reaching and extended time for fine motor tasks with both sides but particularly left upper extremity.   TODAY'S TREATMENT:                                                                                                                              DATE:   Therapeutic Activities    Desensitization Activities:     Patient was assisted to use various textures to help with stimulation of his  L hand/fingers that are numb and tingling ie) with different balls (tennis, squishy, spiky, beanbag-like etc) were used today during education re: desensitization program.  He is encouraged to roll the balls across the table top or between his hands to work on extension of L digits, while also comparing the 'feel' to his R side. He is encouraged to use visualization, verbal feedback/positive talk to help with awareness of different features of objects ie) soft, spiky, rubbery, cool etc. He is encouraged to vary objects and task, difficulty of tasks (with and without vision) and to incorporate sensory stimulation during different activities throughout the week.     Examples of desensitization textures listed on handout for him to use included: ? Brushing: use a hair brush or combing in circular or sweeping motions over the area ? Tapping: use your hand to tap or pat the area ? Towel rubs: use a dry towel and rub the area in circular or sweeping motions ? Massage: massage the area with your hands, may use lotion ? Vibration: apply home massager or other vibration tool over area ? Light touch: gently "tickle" the area with your fingertips      Education provided on applying different textures with different motions light stroking, firm stroking, tapping and circular motions according to what he can tolerate.    Using somatosensory feedback ie) telling self what the surface property is, looking at the texture and exploring the texture with intact areas.     Using mental imagery and verbal/mental feedback re: texture etc when he applies them to affected digits.  PATIENT EDUCATION: Education details: Home Desensitization Program Person educated: Patient Education method: Explanation, Demonstration, Tactile cues, Verbal cues, and Handouts Education comprehension: verbalized understanding,  returned demonstration, verbal cues required, tactile cues required, and needs further education  HOME EXERCISE PROGRAM: 10/14/22: Putty Exercises - Access Code: [facility] 10/21/22: Added Table Top slide to HEP - Same Access code 10/23/2022: coordination HEP 10/28/22: Desensitization Program    GOALS: Goals reviewed with patient? Yes   SHORT TERM GOALS: Target date: 10/30/22  Patient will improve overhead reaching > 100 degrees for B shoulders to assist with ADLs. Baseline: R 100* L 90* Goal status: IN PROGRESS  2.  Patient will demonstrate at least 30 lbs RUE and 15 lbs LUE grip strength as needed to open containers and carry lightweight objects without dropping them.  Baseline: Right Avg 19.8 lbs Left Avg 7.2 lbs Goal status: IN PROGRESS  3.  Patient will complete nine-hole peg with use of RUE in 35 seconds or less and LUE 75 seconds or less.  Baseline: 9 Hole Peg test: Right: 45.22  sec; Left: 1:40.28  sec Goal status: INITIAL  4.  Patient will demonstrate UE strengthening HEP (putty/theraband etc) with 25% verbal cues or less for proper execution. Baseline: Has various materials from hospital. Goal status: IN PROGRESS  5.  Patient will demonstrate good digital A/AROM HEP to minimize stiffness in hands (esp L) which impacts coordination ie) in hand manipulation of small objects. Baseline: Stiff digital extension with decreased end range and difficulty with managing pegs for 9 hole peg test ie) dropped items etc.  Goal status: IN PROGRESS  LONG TERM GOALS: Target date: 11/27/22  Patient will demonstrate UE strength and coordination to cut various foods using appropriate AE if necessary. Baseline: Unable to cut food/Assisted by family Goal status: IN PROGRESS  2.  Patient will demonstrate UE pinch strength and coordination to tie shoelaces tightly. Baseline: Shoelaces frequently unfasten Goal  status: IN PROGRESS  3.  Patient will demonstrate at least 40 lbs RUE and 20 lbs LUE grip  strength as needed to open containers and carry lightweight objects.  Baseline: Right Avg 19.8 lbs Left Avg 7.2 lbs Goal status: IN PROGRESS  4.  Patient will complete nine-hole peg with use of RUE in 35 seconds or less and LUE 75 seconds or less.  Baseline: 9 Hole Peg test: Right: 45.22  sec; Left: 1:40.28  sec Goal status: INITIAL  5.  Patient will be able to complete simple food prep activities with good activity tolerance > 15 minutes and safety (ie. Decreased fall risk and implementation of safety precautions). Baseline: Family does all meal prep Goal status: INITIAL  6. Patient will demonstrate at least 20% improvement with quick Dash score (reporting <68 % disability or less) indicating improved functional use of affected extremity.   Baseline: 88.6% (10/11 categories as Severe/Unable to perform.  Goal Status: INITIAL   ASSESSMENT:  CLINICAL IMPRESSION: Pt demonstrates continued willingness to participate in therapy and acceptance of education to address sensory deficits. Pt continues to benefit from skilled OT services in the outpatient setting to work on UE coordination and strength deficits as well as sensory changes affecting L UE > R UE.      PERFORMANCE DEFICITS: in functional skills including ADLs, IADLs, coordination, dexterity, proprioception, sensation, tone, ROM, strength, pain, muscle spasms, flexibility, Fine motor control, Gross motor control, mobility, balance, endurance, continence, decreased knowledge of use of DME, and UE functional use, cognitive skills including energy/drive, safety awareness, and thought, and psychosocial skills including coping strategies and routines and behaviors.   IMPAIRMENTS: are limiting patient from ADLs, IADLs, rest and sleep, work, leisure, and social participation.   CO-MORBIDITIES: may have co-morbidities  that affects occupational performance. Patient will benefit from skilled OT to address above impairments and improve overall  function.  REHAB POTENTIAL: Good   PLAN:  OT FREQUENCY: 2x/week  OT DURATION: 8 weeks  PLANNED INTERVENTIONS: self care/ADL training, therapeutic exercise, therapeutic activity, neuromuscular re-education, manual therapy, balance training, functional mobility training, splinting, fluidotherapy, patient/family education, energy conservation, coping strategies training, and DME and/or AE instructions  RECOMMENDED OTHER SERVICES: Patient had outpt PT evaluation today also.  CONSULTED AND AGREED WITH PLAN OF CARE: Patient and family member/caregiver  PLAN FOR NEXT SESSIONs:   Progress Coordination activities for HEP  Review B shoulder ROM in sidelying/supine etc and progress from table top slides to wall slides  Consider use of E-stim to improve AROM and pain.   Progress HEPs for putty (pinch to improve grasp for tying laces), and eventually add theraband HEP for UE strengthening.   Victorino Sparrow, OT 10/28/2022, 4:19 PM

## 2022-10-29 ENCOUNTER — Other Ambulatory Visit: Payer: Self-pay | Admitting: Physical Medicine and Rehabilitation

## 2022-11-04 ENCOUNTER — Ambulatory Visit: Payer: Self-pay | Admitting: Occupational Therapy

## 2022-11-04 ENCOUNTER — Encounter: Payer: Self-pay | Admitting: Physical Therapy

## 2022-11-04 ENCOUNTER — Ambulatory Visit: Payer: Self-pay | Admitting: Physical Therapy

## 2022-11-04 VITALS — BP 103/61 | HR 79

## 2022-11-04 DIAGNOSIS — M6281 Muscle weakness (generalized): Secondary | ICD-10-CM

## 2022-11-04 DIAGNOSIS — R208 Other disturbances of skin sensation: Secondary | ICD-10-CM

## 2022-11-04 DIAGNOSIS — R2689 Other abnormalities of gait and mobility: Secondary | ICD-10-CM

## 2022-11-04 DIAGNOSIS — R278 Other lack of coordination: Secondary | ICD-10-CM

## 2022-11-04 DIAGNOSIS — R2681 Unsteadiness on feet: Secondary | ICD-10-CM

## 2022-11-04 NOTE — Therapy (Unsigned)
OUTPATIENT OCCUPATIONAL THERAPY NEURO TREATMENT  Patient Name: Kenneth Flores MRN: [id number] DOB:08/03/1960, 62 y.o., male Today's Date: 11/04/2022  PCP: Norm Salt, [place] REFERRING PROVIDER: Milinda Antis, PA-C  END OF SESSION:  OT End of Session - 11/04/22 1407     Visit Number 6    Number of Visits 16   + Evaluation   Date for OT Re-Evaluation 11/27/22    Authorization Type [employer] 2024 VL: 60 (PT/OT/ST)    OT Start Time 1405    OT Stop Time 1445    OT Time Calculation (min) 40 min    Activity Tolerance Patient tolerated treatment well    Behavior During Therapy WFL for tasks assessed/performed             Past Medical History:  Diagnosis Date   Hepatitis C    Past Surgical History:  Procedure Laterality Date   ANTERIOR CERVICAL DECOMP/DISCECTOMY FUSION N/A 08/21/2022   Procedure: ANTERIOR CERVICAL DECOMPRESSION/DISCECTOMY FUSION CERVICAL THREE-FOUR, CERVICAL FOUR-FIVE, CERVICAL FIVE-SIX;  Surgeon: Julio Sicks, MD;  Location: [facility] OR;  Service: Neurosurgery;  Laterality: N/A;   PELVIC FRACTURE SURGERY Right    Patient Active Problem List   Diagnosis Date Noted   Neurogenic bowel 10/16/2022   Spasticity 10/16/2022   Abnormality of gait 10/16/2022   Acquired left foot drop 10/16/2022   Chronic pain due to trauma 10/16/2022   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) 09/09/2022   Adjustment disorder with depressed mood 08/31/2022   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) 08/27/2022   Cervical spinal cord injury (HCC) 08/19/2022   Chronic hepatitis C without hepatic coma (HCC) 09/08/2021   Thrombocytopenia (HCC) 09/08/2021    ONSET DATE: Referral: 09/17/2022 Hospitalized 08/19/22  REFERRING DIAG: G82.54 (ICD-10-CM) - Quadriplegia, C5-C7 incomplete  THERAPY DIAG:  Other disturbances of skin sensation  Other lack of coordination  Muscle weakness (generalized)  Rationale for Evaluation and Treatment: Rehabilitation  SUBJECTIVE:    SUBJECTIVE STATEMENT:  Patient states he still has tingling along the L arm and numbness (mostly in the hand) which is sensitive to touch.  He says that the R side is pretty good.  Pt accompanied by: self   PERTINENT HISTORY:   Hx of chronic Hep C, status post micturition related syncope from vasovagal episode at home on 08/19/2022 resulting in fall with blow to head. Imaging revealed evidence of significant multilevel cervical degenerative disease with associated disc space collapse and associated spondylosis. Patient with critical spinal stenosis with severe cord compression and cord signal change at C3-4. Severe stenosis at C4-5 and C5-6 without marked cord signal abnormality at these levels. There is some changes posteriorly from C3-C6 with some worrisome findings for some posterior ligamentous injury. There was no evidence of fracture on his CT scan. There is no evidence of malalignment. Neurosurgery consulted and admitted the patient for IV steroids and pain control. On 5/30, he was taken to the OR and underwent C3-4, C4-5, C5-6 anterior cervical discectomy with interbody fusion utilizing interbody cages, local harvested autograft, and anterior plate instrumentation. Motor function improved.   In patient Rehab 08/27/2022 - 09/16/2022   Discharge Diagnoses:  Principal Problem:   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) Active Problems:   Adjustment disorder with depressed mood Active problems: Functional deficits secondary to incomplete spinal cord lesion at C5-C7 level Constipation History of chronic hepatitis C Neurogenic bladder with urinary retention Neurogenic bowel Hypertension  PRECAUTIONS: Cervical and Other: soft collar when moving/walking but takes it off at rest  and when sleeping  WEIGHT BEARING RESTRICTIONS: No  PAIN:  Are you having pain? Yes: NPRS scale: 7/10 Pain location: L arm, shoulder and across shoulder/mid-back Pain description: throbbing (leg) vibrating,  tingling and numbness Aggravating factors: Overworking it, doing too much Relieving factors: rest/relaxing, oral medication 4x/day, heating pad sometimes   FALLS: Has patient fallen in last 6 months? Yes. Number of falls Previously 2 related to same vasovagal issues - the first fall without injury was a couple of weeks prior to the second fall when he hit his head on the sink   Additional fall around 10/06/22 when his legs did a split while trying to walk at home  LIVING ENVIRONMENT: Lives with: lives with their spouse - [name] Lives in: House/apartment Stairs: Yes: Internal: 14 steps; on right going up, on left going up, and only half way up on the R side; wife walks with him upstairs to the bedrooms Has following equipment at home: Single point cane, Shower bench, and Grab bars  PLOF: Independent, driving, worked as Administrator (x40 hours/week)  PATIENT GOALS: Get my L arm better, tie shoelaces tighter so they stay tied  OBJECTIVE:   HAND DOMINANCE: Right  ADLs: Overall ADLs: Min to mod assist Transfers/ambulation related to ADLs: SBA with cane Eating: Needs help to cut his food Grooming: He tries to shave himself but gets help for the things he can't reach/get to on his own. UB Dressing: He can dress himself with simple clothes but needs extra time but needs help with fasteners LB Dressing: Needs help to get TED hose on and tie shoelaces Toileting: MI but wife is still monitoring him Bathing: Wife assists with his back and is present during showers Tub Shower transfers: Tub transfer bench - wife is there for extra support due to him being wobbly and has been trying to step into the tub with grab bars rather than sitting on the bench all the time Equipment: Transfer tub bench and Grab bars  IADLs: Shopping: Wife is performing shopping. Light housekeeping: Dependent on family ie) Not able to safely or effectively help take out the trash, mow the lawn etc. Meal Prep: Wife performs  this.  He did help with this a little previously. Community mobility: Uses cane with supervision Medication management: No meds previously but now 4x/day.  Using pill box and patient did sort them himself the last time but his wife is checking to make sure they are accurate and calls him to remind him to take his medication as needed (ie if she is working Catering manager). Financial management: Wife is helping more than she needed to previously Handwriting: 50% legible - Patient reports his current writing is not as good as before (about 50% compared to before) but it has improved significantly from when he had to mark/sign with an "X" only.    Patient is never home alone (if his wife is at work, his stepson will be there).  He stays on the bottom level of the home during the daytime and is off the narcotic in the daytime.  He uses his cane in the house  MOBILITY STATUS: Needs Assist: supervision for safety  POSTURE COMMENTS:  forward head Sitting balance: WFL  ACTIVITY TOLERANCE: Activity tolerance: Decreased.  Patient reports falling asleep/napping daily at this time (combo of medication etc)  FUNCTIONAL OUTCOME MEASURES: Quick Dash: 88.6  (see eval report for complete list of limitations).  UPPER EXTREMITY ROM:    Active ROM Right eval Left eval  Shoulder flexion  100 90  Shoulder abduction 90 75  Shoulder adduction    Shoulder extension Limited end range   Shoulder internal rotation   Shoulder external rotation    Elbow flexion Limited end range  Elbow extension Decreased end range (L side spasmed with stretch  Wrist flexion    Wrist extension    Wrist ulnar deviation    Wrist radial deviation    Wrist pronation    Wrist supination    Digital extension Decreased end range and slow to open   (Blank rows = not formally tested at eval)  UPPER EXTREMITY MMT:     MMT Right eval Left eval  Shoulder flexion 3- 3-  Shoulder abduction    Shoulder adduction    Shoulder extension     Shoulder internal rotation    Shoulder external rotation    Middle trapezius    Lower trapezius    Elbow flexion 3 3-  Elbow extension    Wrist flexion    Wrist extension    Wrist ulnar deviation    Wrist radial deviation    Wrist pronation    Wrist supination    (Blank rows = not formally tested at eval)  HAND FUNCTION: Grip strength: Right: 21.1, 18.2, 20.0 lbs; Left: 8.5, 5.7, 7.4  lbs Evaluation: Right Avg 19.8 lbs Left Avg 7.2 lbs  COORDINATION: 9 Hole Peg test: Right: 45.22  sec; Left: 1:40.28  sec Box and Blocks:  Right 41 blocks, Left 31 blocks  SENSATION: Not tested  EDEMA: None noted in UEs  MUSCLE TONE: Patient has some stiffness with ROM  COGNITION: Overall cognitive status: Within functional limits for tasks assessed BIMS from hospital (08/2022) 15/15  VISION: Subjective report: No changes since accident Baseline vision: Bifocals Visual history: NA  VISION ASSESSMENT: Not tested  Patient has no changes or difficulty with any activities due to vision.  PERCEPTION: Not tested  PRAXIS: Not tested  EVALUATION OBSERVATIONS: Patient seen following physical therapy services this afternoon.  He arrived ambulating with a single-point cane.  Patient is accompanied by his wife.  Patient is soft-spoken and noted to refer to his wife for answers throughout session.  He has noted to have limitations in bilateral upper extremity range of motion left greater than right with limited success with overhead reaching and extended time for fine motor tasks with both sides but particularly left upper extremity.   TODAY'S TREATMENT:                                                                                                                               Therapeutic Activities   Patient continues to be encouraged to use various textures to help with stimulation of his L hand/fingers that are numb and tingling with rice bin used today for desensitization/resensitization.   He is encouraged to use both hands to work on finding objects hidden in the rice and compare how they 'feel'.  He is encouraged  to try and identify objects or describe them with vision occluded with fairly good success at identifying key features of items such as block, peg, checker, peg, screw, etc.  He is encouraged to use visualization, verbal feedback/positive talk to help with awareness of different features of objects ie) round, square, spiky, rubbery, cool etc.   Various FM tasks also practiced, modified, progressed and updated; including pushing pegs into putty, in hand manipulation of objects etc.  PATIENT EDUCATION: Education details: Desensitization Activities Person educated: Patient Education method: Explanation, Demonstration, Tactile cues, Verbal cues, and Handouts Education comprehension: verbalized understanding, returned demonstration, verbal cues required, tactile cues required, and needs further education  HOME EXERCISE PROGRAM: 10/14/22: Putty Exercises - Access Code: [facility] 10/21/22: Added Table Top slide to HEP - Same Access code 10/23/2022: coordination HEP 10/28/22: Desensitization Program    GOALS: Goals reviewed with patient? Yes   SHORT TERM GOALS: Target date: 10/30/22  Patient will improve overhead reaching > 100 degrees for B shoulders to assist with ADLs. Baseline: R 100* L 90* Goal status: IN PROGRESS  2.  Patient will demonstrate at least 30 lbs RUE and 15 lbs LUE grip strength as needed to open containers and carry lightweight objects without dropping them.  Baseline: Right Avg 19.8 lbs Left Avg 7.2 lbs Goal status: IN PROGRESS  3.  Patient will complete nine-hole peg with use of RUE in 35 seconds or less and LUE 75 seconds or less.  Baseline: 9 Hole Peg test: Right: 45.22  sec; Left: 1:40.28  sec Goal status: IN Progress  4.  Patient will demonstrate UE strengthening HEP (putty/theraband etc) with 25% verbal cues or less for proper  execution. Baseline: Has various materials from hospital. Goal status: IN PROGRESS  5.  Patient will demonstrate good digital A/AROM HEP to minimize stiffness in hands (esp L) which impacts coordination ie) in hand manipulation of small objects. Baseline: Stiff digital extension with decreased end range and difficulty with managing pegs for 9 hole peg test ie) dropped items etc.  Goal status: IN PROGRESS  LONG TERM GOALS: Target date: 11/27/22  Patient will demonstrate UE strength and coordination to cut various foods using appropriate AE if necessary. Baseline: Unable to cut food/Assisted by family Goal status: IN PROGRESS  2.  Patient will demonstrate UE pinch strength and coordination to tie shoelaces tightly. Baseline: Shoelaces frequently unfasten Goal status: IN PROGRESS  3.  Patient will demonstrate at least 40 lbs RUE and 20 lbs LUE grip strength as needed to open containers and carry lightweight objects.  Baseline: Right Avg 19.8 lbs Left Avg 7.2 lbs Goal status: IN PROGRESS  4.  Patient will complete nine-hole peg with use of RUE in 35 seconds or less and LUE 75 seconds or less.  Baseline: 9 Hole Peg test: Right: 45.22  sec; Left: 1:40.28  sec Goal status: INITIAL  5.  Patient will be able to complete simple food prep activities with good activity tolerance > 15 minutes and safety (ie. Decreased fall risk and implementation of safety precautions). Baseline: Family does all meal prep Goal status: INITIAL  6. Patient will demonstrate at least 20% improvement with quick Dash score (reporting <68 % disability or less) indicating improved functional use of affected extremity.   Baseline: 88.6% (10/11 categories as Severe/Unable to perform.  Goal Status: INITIAL   ASSESSMENT:  CLINICAL IMPRESSION: Pt demonstrates continued willingness to participate in therapy and acceptance of education to address sensory deficits.  He tolerated rice activity well today with  some good  awareness of different features of hidden items even with vision occluded. Pt continues to benefit from skilled OT services in the outpatient setting to work on UE coordination and strength deficits as well as sensory changes affecting L UE > R UE.      PERFORMANCE DEFICITS: in functional skills including ADLs, IADLs, coordination, dexterity, proprioception, sensation, tone, ROM, strength, pain, muscle spasms, flexibility, Fine motor control, Gross motor control, mobility, balance, endurance, continence, decreased knowledge of use of DME, and UE functional use, cognitive skills including energy/drive, safety awareness, and thought, and psychosocial skills including coping strategies and routines and behaviors.   IMPAIRMENTS: are limiting patient from ADLs, IADLs, rest and sleep, work, leisure, and social participation.   CO-MORBIDITIES: may have co-morbidities  that affects occupational performance. Patient will benefit from skilled OT to address above impairments and improve overall function.  REHAB POTENTIAL: Good   PLAN:  OT FREQUENCY: 2x/week  OT DURATION: 8 weeks  PLANNED INTERVENTIONS: self care/ADL training, therapeutic exercise, therapeutic activity, neuromuscular re-education, manual therapy, balance training, functional mobility training, splinting, fluidotherapy, patient/family education, energy conservation, coping strategies training, and DME and/or AE instructions  RECOMMENDED OTHER SERVICES: Patient had outpt PT evaluation today also.  CONSULTED AND AGREED WITH PLAN OF CARE: Patient and family member/caregiver  PLAN FOR NEXT SESSIONs:   Progress Coordination and sensory stimulation activities for HEP  Progress B shoulder ROM in sidelying/supine etc and progress from table top slides to wall slides  Consider use of E-stim to improve AROM and pain.   Progress HEPs for putty (pinch to improve grasp for tying laces).  PRINT and UPDATE heraband HEP for UE  strengthening. Pt issued yellow, green and blue therabands for HEP in preparation for D/C at the hospital but images were hand drawn by his report. Exercises located in inpt OT notes included external rotation, diagonal pulls, bicep curls, triceps extension, shoulder horizontal abduction. Pt demonstrated exercises and verbal understood tasks. Outpt OT to review, update and print. Pt may need folder/binder to keep up with information.  Victorino Sparrow, OT 11/04/2022, 5:05 PM

## 2022-11-04 NOTE — Therapy (Signed)
OUTPATIENT PHYSICAL THERAPY NEURO TREATMENT   Patient Name: Kenneth Flores MRN: [id number] DOB:February 01, 1961, 62 y.o., male Today's Date: 11/04/2022   PCP: Norm Salt, [place] REFERRING PROVIDER: Milinda Antis, PA-C  END OF SESSION:  PT End of Session - 11/04/22 1327     Visit Number 6    Number of Visits 17   16 + eval   Date for PT Re-Evaluation 11/27/22   pushed out due to delay in scheduling   Authorization Type [employer] ULTRA    PT Start Time 1320    PT Stop Time 1404    PT Time Calculation (min) 44 min    Equipment Utilized During Treatment Gait belt    Activity Tolerance Patient tolerated treatment well    Behavior During Therapy WFL for tasks assessed/performed              Past Medical History:  Diagnosis Date   Hepatitis C    Past Surgical History:  Procedure Laterality Date   ANTERIOR CERVICAL DECOMP/DISCECTOMY FUSION N/A 08/21/2022   Procedure: ANTERIOR CERVICAL DECOMPRESSION/DISCECTOMY FUSION CERVICAL THREE-FOUR, CERVICAL FOUR-FIVE, CERVICAL FIVE-SIX;  Surgeon: Julio Sicks, MD;  Location: [facility] OR;  Service: Neurosurgery;  Laterality: N/A;   PELVIC FRACTURE SURGERY Right    Patient Active Problem List   Diagnosis Date Noted   Neurogenic bowel 10/16/2022   Spasticity 10/16/2022   Abnormality of gait 10/16/2022   Acquired left foot drop 10/16/2022   Chronic pain due to trauma 10/16/2022   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) 09/09/2022   Adjustment disorder with depressed mood 08/31/2022   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) 08/27/2022   Cervical spinal cord injury (HCC) 08/19/2022   Chronic hepatitis C without hepatic coma (HCC) 09/08/2021   Thrombocytopenia (HCC) 09/08/2021    ONSET DATE: 08/20/2022 (surgery date)  REFERRING DIAG: 09/16/2022  THERAPY DIAG:  Muscle weakness (generalized)  Other lack of coordination  Unsteadiness on feet  Other abnormalities of gait and mobility  Rationale for Evaluation and Treatment:  Rehabilitation  SUBJECTIVE:                                                                                                                                                                                             SUBJECTIVE STATEMENT: Patient received from lobby and upon standing he reports he is dizzy.  This resolves w/ prolonged standing using rollator support, PT providing CGA for safety.  He states he has had no falls or near falls since last visit.  Both shoulders, but mostly the left arm, have been bothering him the past few days. Pt accompanied by: significant  other-[name] (not present during session)  PERTINENT HISTORY: Chronic Hep C  Patient had syncopal episode in bathroom at home when urinating on 08/19/2022.  When he fell backwards he hit his head.  On 5/30, he was taken to the OR by Dr. Jordan Likes for C3-C6 anterior cervical discectomy and interbody fusion with anterior plate instrumentation secondary to multilevel cervical degenerative disease with disc space collapse, spondylosis and critical spinal stenosis with severe cord compression and signal change at level C3-4.  PAIN:  Are you having pain? Yes: NPRS scale: 6/10 Pain location: left arm and right shoulder Pain description: vibrations and tingling, shooting pains Aggravating factors: random, overworking the arm Relieving factors: pain medicines  PRECAUTIONS: Cervical and Fall-per Dr. Jordan Likes in-basket 7/24 pt has no restrictions.  WEIGHT BEARING RESTRICTIONS: No  FALLS: Has patient fallen in last 6 months? Yes. Number of falls 1-fall that caused surgical need  LIVING ENVIRONMENT: Lives with: lives with their spouse Lives in: House/apartment Stairs: Yes: Internal: 14 steps; on left going up and half of stairs have bilateral rails Has following equipment at home: Single point cane, Shower bench, and Grab bars  PLOF: Requires assistive device for independence, Needs assistance with ADLs, Needs assistance with homemaking,  Needs assistance with gait, and Needs assistance with transfers  PATIENT GOALS: "To try to get better"  OBJECTIVE:   DIAGNOSTIC FINDINGS:  MRI cervical spine 5/29: IMPRESSION: 1. Positive for evidence of both Ligamentous Injury, and Abnormal Cervical Spinal Cord in the setting of trauma: - Prevertebral edema or fluid from C1 through C6. Consider anterior longitudinal ligament injury, although no discrete ligamentous disruption is identified. - mild posterior interspinous ligamentous injury at C3-C4 eccentric to the right. - Abnormal spinal cord signal from C3 to the C5 cord level. While some of this most resembles chronic Myelomalacia (see #2), a component of Acute Cord Contusion cannot be excluded - especially in the left central cord at C3.   *Underlying bulky cervical spine degeneration with spinal stenosis AND spinal cord mass effect from C3-C4 (Moderate) through C5-C6 (mild). Associated moderate to severe degenerative neural foraminal stenosis at the bilateral C4, C5, and left C6 nerve levels.   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  COGNITION: Overall cognitive status:  Wife answers for pt primarily, pt appears to have delayed processing and requires repetition of questions.   SENSATION: Light touch: WFL and pt reporting tingling all the way down the left leg  COORDINATION: LE RAMS:  slow and deliberate BLE heel-to-shin:  limited due to functional weakness  EDEMA:  None noted in BLE, pt and wife deny swelling at home, he is wearing bilateral compression stockings for DVT prevention per wife-she states she will ask MD if these are necessary any longer.  MUSCLE TONE: Questionable left clonus  POSTURE: forward head-wearing soft cervical collar  LOWER EXTREMITY ROM/MMT:     Active  Right Eval Left Eval  Hip flexion 3+/5 2+/5  Hip extension    Hip abduction    Hip adduction    Hip internal rotation    Hip  external rotation    Knee flexion    Knee extension 4-/5 3/5  Ankle dorsiflexion 4/5 3/5  Ankle plantarflexion    Ankle inversion    Ankle eversion     (Blank rows = not tested)  BED MOBILITY:  Sit to supine Complete Independence Supine to sit Complete Independence Rolling to Right Complete Independence Rolling to Left Complete Independence *Pain with  rolling  TRANSFERS: Assistive device utilized: Single point cane  Sit to stand: SBA Stand to sit: SBA Chair to chair: SBA  GAIT: Gait pattern: step through pattern, decreased arm swing- Left, decreased step length- Left, decreased stride length, and narrow BOS Distance walked: various clinic distances Assistive device utilized: Single point cane Level of assistance: SBA  FUNCTIONAL TESTS:  5 times sit to stand: 27.91 seconds w/ BUE support 10 meter walk test: 14.82 seconds w/ SPC = 0.67 m/sec OR 2.23 ft/sec Berg Balance Scale: To be assessed.  PATIENT SURVEYS:  None completed due to time.  TODAY'S TREATMENT:                                                                                                                              DATE: 11/04/2022 Cervical isometrics:  -flexion 10 x 2 second holds  -extension (used towel) 10 x 2 second holds  -lateral flexion 10 x 2 second hold each side  -rotation 10 x 2 second hold each side  Manual therapy:  -Spike ball to bilateral upper traps in sitting  -Large IASTM to upper traps for diffuse STM  -Smaller IASTM tool to left upper trap mid-muscle trigger point release and myofascial work as well as bilateral periscapulars  -Bilateral leg press 3x8 at 60 lbs w/ approximately 1 minute rest between sets, time spent educating on eccentric control and preventing hyperextension practicing slow reps  PATIENT EDUCATION: Education details: Continue HEP and walking program.  Discussed possible left upper trap trigger point and performing manual therapy/IASTM as needed in future and  benefits as completed today.  Instructed pt to watch out for light bruising and redness from manual technique used today and this should go away w/in a day or so. Person educated: Patient Education method: Explanation Education comprehension: verbalized understanding and needs further education  HOME EXERCISE PROGRAM: Access Code: [email or web address] URL: [email or web address] Date: 10/09/2022 Prepared by: Camille Bal  Exercises - Side Stepping with Resistance at Thighs and Counter Support  - 1 x daily - 5 x weekly - 3 sets - 10 reps - Standing Tandem Balance with Counter Support  - 1 x daily - 5 x weekly - 1 sets - 4 reps - 30 seconds hold - Sit to Stand with Resistance Around Legs  - 1 x daily - 5 x weekly - 1-2 sets - 10 reps - Heel Raises with Counter Support  - 1 x daily - 5 x weekly - 2 sets - 12 reps - Supine Chin Tuck  - 1 x daily - 5 x weekly - 3 sets - 10 reps  -Cervical AROM in all planes as tolerated  You Can Walk For A Certain Length Of Time Each Day (use rollator for safety and have wife nearby for supervision)                          Walk 4-5 minutes 2-3 times per  day.             Increase 2-3  minutes every week.             Work up to 20 minutes continuously once per day.               Example:                         Day 1-2           4-5 minutes     3 times per day                         Day 7-8           10-12 minutes 2-3 times per day                         Day 13-14       20-22 minutes 1-2 times per day  GOALS: Goals reviewed with patient? Yes  SHORT TERM GOALS: Target date: 10/23/2022  Pt will be independent and compliant with introductory strength and balance HEP in order to maintain functional progress and improve mobility. Baseline:  Patient compliant (8/7) Goal status: MET  2.  Pt will decrease 5xSTS to </=22.91 seconds in order to demonstrate decreased risk for falls and improved functional bilateral LE strength and power. Baseline: 27.91  seconds w/ BUE support; 24.41 seconds w/ BUE support (8/7) Goal status: IN PROGRESS  3.  Pt will demonstrate a gait speed of >/=0.8 m/sec in order to decrease risk for falls. Baseline: 0.67 m/sec; 0.63 m/sec OR 2.06 ft/sec w/ rollator (8/7) Goal status: NOT MET  4.  Pt will increase BERG balance score to >/=40/56 to demonstrate improved static balance. Baseline: 36/56 (7/19); 42/56 (8/7) Goal status: MET  LONG TERM GOALS: Target date: 11/20/2022  Pt will be independent and compliant with advanced strength and balance HEP in order to maintain functional progress and improve mobility. Baseline:  To be established. Goal status: INITIAL  2.  Pt will decrease 5xSTS to </=17.91 seconds in order to demonstrate decreased risk for falls and improved functional bilateral LE strength and power. Baseline: 27.91 seconds w/ BUE support Goal status: INITIAL  3.  Pt will demonstrate a gait speed of >/=0.93 m/sec in order to decrease risk for falls. Baseline: 0.67 m/sec Goal status: INITIAL  4.  Pt will increase BERG balance score to >/=44/56 to demonstrate improved static balance. Baseline: 36/56 (7/19) Goal status: INITIAL  5.  Pt will ambulate >/=500 feet independently over unlevel surface to promote household and community access. Baseline: inside clinic level distance w/ SPC SBA Goal status: INITIAL  6.  Patient will report average 7 day pain of </=5/10 at rest in order to demonstrate improved quality of life. Baseline: 7/10 shoulders and left arm at rest Goal status: INITIAL  ASSESSMENT:  CLINICAL IMPRESSION: Manual therapy used for pain management of cervical and periscapular region today with good immediate response.  Had patient perform cervical isometrics and LE strengthening to continue working on overall goal of improved whole body mobility.  He tolerated all challenges well and continues to benefit from skilled PT services to address lingering deficits as outlined in PT  POC.  OBJECTIVE IMPAIRMENTS: Abnormal gait, decreased activity tolerance, decreased balance, decreased cognition, decreased coordination, decreased endurance, decreased knowledge of condition, difficulty walking, decreased ROM, decreased strength, impaired sensation,  impaired tone, improper body mechanics, postural dysfunction, and pain.   ACTIVITY LIMITATIONS: carrying, lifting, bending, standing, squatting, stairs, transfers, bed mobility, bathing, reach over head, and locomotion level  PARTICIPATION LIMITATIONS: meal prep, cleaning, laundry, medication management, driving, shopping, community activity, occupation, and yard work  PERSONAL FACTORS: Age, Fitness, Past/current experiences, Transportation, and 1 comorbidity: chronic Hep C  are also affecting patient's functional outcome.   REHAB POTENTIAL: Good  CLINICAL DECISION MAKING: Evolving/moderate complexity  EVALUATION COMPLEXITY: Moderate  PLAN:  PT FREQUENCY: 1-2x/week (scheduling for 1x/wk initially until out of cervical collar)  PT DURATION: 8 weeks  PLANNED INTERVENTIONS: Therapeutic exercises, Therapeutic activity, Neuromuscular re-education, Balance training, Gait training, Patient/Family education, Self Care, Stair training, Vestibular training, Orthotic/Fit training, DME instructions, scar mobilization, Manual therapy, and Re-evaluation  PLAN FOR NEXT SESSION: Modify strength and balance HEP prn.   Gait training w/ vs w/o SPC vs rollator for long/unlevel.  Endurance.  LLE NMR-leg press (try single), hurdles, unsupported static balance, periscapular strength  Sadie Haber, PT, DPT 11/04/2022, 2:04 PM

## 2022-11-06 ENCOUNTER — Ambulatory Visit: Payer: Self-pay | Admitting: Occupational Therapy

## 2022-11-06 DIAGNOSIS — R278 Other lack of coordination: Secondary | ICD-10-CM

## 2022-11-06 DIAGNOSIS — R208 Other disturbances of skin sensation: Secondary | ICD-10-CM

## 2022-11-06 DIAGNOSIS — M6281 Muscle weakness (generalized): Secondary | ICD-10-CM

## 2022-11-06 DIAGNOSIS — M79602 Pain in left arm: Secondary | ICD-10-CM

## 2022-11-06 NOTE — Therapy (Signed)
OUTPATIENT OCCUPATIONAL THERAPY NEURO TREATMENT  Patient Name: Kenneth Flores MRN: [id number] DOB:[birth year], 62 y.o., male Today's Date: 11/06/2022  PCP: Norm Salt, [place] REFERRING PROVIDER: Milinda Antis, PA-C  END OF SESSION:  OT End of Session - 11/06/22 1234     Visit Number 7 (P)     Number of Visits 16 (P)    + Evaluation   Date for OT Re-Evaluation 11/27/22 (P)     Authorization Type [employer] 2024 VL: 60 (PT/OT/ST) (P)     Activity Tolerance Patient tolerated treatment well (P)     Behavior During Therapy WFL for tasks assessed/performed (P)              Past Medical History:  Diagnosis Date   Hepatitis C    Past Surgical History:  Procedure Laterality Date   ANTERIOR CERVICAL DECOMP/DISCECTOMY FUSION N/A 08/21/2022   Procedure: ANTERIOR CERVICAL DECOMPRESSION/DISCECTOMY FUSION CERVICAL THREE-FOUR, CERVICAL FOUR-FIVE, CERVICAL FIVE-SIX;  Surgeon: Julio Sicks, MD;  Location: [facility] OR;  Service: Neurosurgery;  Laterality: N/A;   PELVIC FRACTURE SURGERY Right    Patient Active Problem List   Diagnosis Date Noted   Neurogenic bowel 10/16/2022   Spasticity 10/16/2022   Abnormality of gait 10/16/2022   Acquired left foot drop 10/16/2022   Chronic pain due to trauma 10/16/2022   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) 09/09/2022   Adjustment disorder with depressed mood 08/31/2022   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 He asks her to massage it deeply in order for it to feel like it gets to the pain.   Pt accompanied by: self   PERTINENT HISTORY:   Hx of chronic Hep C, status post micturition related syncope from vasovagal episode at home on 08/19/2022 resulting in fall with blow to head. Imaging revealed evidence of significant multilevel cervical degenerative disease with associated disc space collapse and associated spondylosis. Patient with critical spinal stenosis with severe cord compression and cord signal change at C3-4. Severe stenosis at C4-5 and C5-6 without marked cord signal abnormality at these levels. There is some changes posteriorly from C3-C6 with some worrisome findings for some posterior ligamentous injury. There was no evidence of fracture on his CT scan. There is no evidence of malalignment. Neurosurgery consulted and admitted the patient for IV steroids and pain control. On 5/30, he was taken to the OR and underwent C3-4, C4-5, C5-6 anterior cervical discectomy with interbody fusion utilizing interbody cages, local harvested autograft, and anterior plate instrumentation. Motor function improved.   In patient Rehab 08/27/2022 - 09/16/2022   Discharge Diagnoses:  Principal Problem:   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) Active Problems:   Adjustment disorder with depressed mood Active problems: Functional deficits secondary to incomplete spinal cord lesion at C5-C7 level Constipation History of chronic hepatitis C Neurogenic bladder with urinary retention Neurogenic bowel Hypertension  PRECAUTIONS: Cervical and Other: soft collar when moving/walking but takes it off at rest and  when sleeping  WEIGHT BEARING RESTRICTIONS: No  PAIN:  Are you having pain? Yes: NPRS scale: 6/10 Pain location: L arm, shoulder and across shoulder/mid-back Pain description: throbbing (leg) vibrating, tingling and  numbness Aggravating factors: Overworking it, doing too much Relieving factors: rest/relaxing, oral medication 4x/day, heating pad sometimes   FALLS: Has patient fallen in last 6 months? Yes. Number of falls Previously 2 related to same vasovagal issues - the first fall without injury was a couple of weeks prior to the second fall when he hit his head on the sink   Additional fall around 10/06/22 when his legs did a split while trying to walk at home  LIVING ENVIRONMENT: Lives with: lives with their spouse - [name] Lives in: House/apartment Stairs: Yes: Internal: 14 steps; on right going up, on left going up, and only half way up on the R side; wife walks with him upstairs to the bedrooms Has following equipment at home: Single point cane, Shower bench, and Grab bars  PLOF: Independent, driving, worked as Administrator (x40 hours/week)  PATIENT GOALS: Get my L arm better, tie shoelaces tighter so they stay tied  OBJECTIVE:   HAND DOMINANCE: Right  ADLs: Overall ADLs: Min to mod assist Transfers/ambulation related to ADLs: SBA with cane Eating: Needs help to cut his food Grooming: He tries to shave himself but gets help for the things he can't reach/get to on his own. UB Dressing: He can dress himself with simple clothes but needs extra time but needs help with fasteners LB Dressing: Needs help to get TED hose on and tie shoelaces Toileting: MI but wife is still monitoring him Bathing: Wife assists with his back and is present during showers Tub Shower transfers: Tub transfer bench - wife is there for extra support due to him being wobbly and has been trying to step into the tub with grab bars rather than sitting on the bench all the time Equipment: Transfer tub bench and Grab bars  IADLs: Shopping: Wife is performing shopping. Light housekeeping: Dependent on family ie) Not able to safely or effectively help take out the trash, mow the lawn etc. Meal Prep: Wife performs this.  He did  help with this a little previously. Community mobility: Uses cane with supervision Medication management: No meds previously but now 4x/day.  Using pill box and patient did sort them himself the last time but his wife is checking to make sure they are accurate and calls him to remind him to take his medication as needed (ie if she is working Catering manager). Financial management: Wife is helping more than she needed to previously Handwriting: 50% legible - Patient reports his current writing is not as good as before (about 50% compared to before) but it has improved significantly from when he had to mark/sign with an "X" only.    Patient is never home alone (if his wife is at work, his stepson will be there).  He stays on the bottom level of the home during the daytime and is off the narcotic in the daytime.  He uses his cane in the house  MOBILITY STATUS: Needs Assist: supervision for safety  POSTURE COMMENTS:  forward head Sitting balance: WFL  ACTIVITY TOLERANCE: Activity tolerance: Decreased.  Patient reports falling asleep/napping daily at this time (combo of medication etc)  FUNCTIONAL OUTCOME MEASURES: Quick Dash: 88.6  (see eval report for complete list of limitations).  UPPER EXTREMITY ROM:    Active ROM Right eval Left eval  Shoulder flexion 100  90  Shoulder abduction 90 75  Shoulder adduction    Shoulder extension Limited end range   Shoulder internal rotation   Shoulder external rotation    Elbow flexion Limited end range  Elbow extension Decreased end range (L side spasmed with stretch  Wrist flexion    Wrist extension    Wrist ulnar deviation    Wrist radial deviation    Wrist pronation    Wrist supination    Digital extension Decreased end range and slow to open   (Blank rows = not formally tested at eval)  UPPER EXTREMITY MMT:     MMT Right eval Left eval  Shoulder flexion 3- 3-  Shoulder abduction    Shoulder adduction    Shoulder extension    Shoulder  internal rotation    Shoulder external rotation    Middle trapezius    Lower trapezius    Elbow flexion 3 3-  Elbow extension    Wrist flexion    Wrist extension    Wrist ulnar deviation    Wrist radial deviation    Wrist pronation    Wrist supination    (Blank rows = not formally tested at eval)  HAND FUNCTION: Grip strength: Right: 21.1, 18.2, 20.0 lbs; Left: 8.5, 5.7, 7.4  lbs Evaluation: Right Avg 19.8 lbs Left Avg 7.2 lbs  COORDINATION: 9 Hole Peg test: Right: 45.22  sec; Left: 1:40.28  sec Box and Blocks:  Right 41 blocks, Left 31 blocks  SENSATION: Not tested  EDEMA: None noted in UEs  MUSCLE TONE: Patient has some stiffness with ROM  COGNITION: Overall cognitive status: Within functional limits for tasks assessed BIMS from hospital (08/2022) 15/15  VISION: Subjective report: No changes since accident Baseline vision: Bifocals Visual history: NA  VISION ASSESSMENT: Not tested  Patient has no changes or difficulty with any activities due to vision.  PERCEPTION: Not tested  PRAXIS: Not tested  EVALUATION OBSERVATIONS: Patient seen following physical therapy services this afternoon.  He arrived ambulating with a single-point cane.  Patient is accompanied by his wife.  Patient is soft-spoken and noted to refer to his wife for answers throughout session.  He has noted to have limitations in bilateral upper extremity range of motion left greater than right with limited success with overhead reaching and extended time for fine motor tasks with both sides but particularly left upper extremity.   TODAY'S TREATMENT:                                                                                                                              - Therapeutic exercises completed for duration as noted below including:  OT initiated dowel AAROM HEP including shoulder flex, shoulder horizontal abduction and adduction, wrist flex and ext, and chest press for improved ROM of  affected extremity. Pt able to return demonstration of each exercise x5 each in supine. Handout provided.  Pt completed L elbow flex and ext over edge  of mat while L sidelying for improved proprioceptive input and feedback with ROM.   Pt completed L shoulder flex while R sidelying with handheld assist from therapist and intermittent oscillations to improve ROM and pain. Pt requiring cues to keep shoulder depressed with completion.   In prone and sitting edge of mat table, pt completed shoulder retractions to improve strength, improve glide across thoracic cavity, and to reduce shoulder pain.   PATIENT EDUCATION: Education details: Optician, dispensing Person educated: Patient Education method: Explanation, Demonstration, Actor cues, Verbal cues, and Handouts Education comprehension: verbalized understanding, returned demonstration, verbal cues required, tactile cues required, and needs further education  HOME EXERCISE PROGRAM: 10/14/22: Putty Exercises - Access Code: [facility] 10/21/22: Added Table Top slide to HEP - Same Access code 10/23/2022: coordination HEP 10/28/22: Desensitization Program  11/06/2022: dowel HEP  GOALS: Goals reviewed with patient? Yes   SHORT TERM GOALS: Target date: 10/30/22  Patient will improve overhead reaching > 100 degrees for B shoulders to assist with ADLs. Baseline: R 100* L 90* Goal status: IN PROGRESS  2.  Patient will demonstrate at least 30 lbs RUE and 15 lbs LUE grip strength as needed to open containers and carry lightweight objects without dropping them.  Baseline: Right Avg 19.8 lbs Left Avg 7.2 lbs Goal status: IN PROGRESS  3.  Patient will complete nine-hole peg with use of RUE in 35 seconds or less and LUE 75 seconds or less.  Baseline: 9 Hole Peg test: Right: 45.22  sec; Left: 1:40.28  sec Goal status: IN Progress  4.  Patient will demonstrate UE strengthening HEP (putty/theraband etc) with 25% verbal cues or less for proper execution. Baseline: Has  various materials from hospital. Goal status: IN PROGRESS  5.  Patient will demonstrate good digital A/AROM HEP to minimize stiffness in hands (esp L) which impacts coordination ie) in hand manipulation of small objects. Baseline: Stiff digital extension with decreased end range and difficulty with managing pegs for 9 hole peg test ie) dropped items etc.  Goal status: IN PROGRESS  LONG TERM GOALS: Target date: 11/27/22  Patient will demonstrate UE strength and coordination to cut various foods using appropriate AE if necessary. Baseline: Unable to cut food/Assisted by family Goal status: IN PROGRESS  2.  Patient will demonstrate UE pinch strength and coordination to tie shoelaces tightly. Baseline: Shoelaces frequently unfasten Goal status: IN PROGRESS  3.  Patient will demonstrate at least 40 lbs RUE and 20 lbs LUE grip strength as needed to open containers and carry lightweight objects.  Baseline: Right Avg 19.8 lbs Left Avg 7.2 lbs Goal status: IN PROGRESS  4.  Patient will complete nine-hole peg with use of RUE in 35 seconds or less and LUE 75 seconds or less.  Baseline: 9 Hole Peg test: Right: 45.22  sec; Left: 1:40.28  sec Goal status: INITIAL  5.  Patient will be able to complete simple food prep activities with good activity tolerance > 15 minutes and safety (ie. Decreased fall risk and implementation of safety precautions). Baseline: Family does all meal prep Goal status: INITIAL  6. Patient will demonstrate at least 20% improvement with quick Dash score (reporting <68 % disability or less) indicating improved functional use of affected extremity.   Baseline: 88.6% (10/11 categories as Severe/Unable to perform.  Goal Status: INITIAL   ASSESSMENT:  CLINICAL IMPRESSION: Pt demonstrates shoulder hike throughout therapy visit, which appears to be contributing to upper trap tightness and in part to LUE pain. Pt to benefit from  focus on scapular movements and strengthening to  improve muscular imbalances and pain. Some of the pt's perceived pain likely coming from improved nerve function and sensation.    PERFORMANCE DEFICITS: in functional skills including ADLs, IADLs, coordination, dexterity, proprioception, sensation, tone, ROM, strength, pain, muscle spasms, flexibility, Fine motor control, Gross motor control, mobility, balance, endurance, continence, decreased knowledge of use of DME, and UE functional use, cognitive skills including energy/drive, safety awareness, and thought, and psychosocial skills including coping strategies and routines and behaviors.   IMPAIRMENTS: are limiting patient from ADLs, IADLs, rest and sleep, work, leisure, and social participation.   CO-MORBIDITIES: may have co-morbidities  that affects occupational performance. Patient will benefit from skilled OT to address above impairments and improve overall function.  REHAB POTENTIAL: Good   PLAN:  OT FREQUENCY: 2x/week  OT DURATION: 8 weeks  PLANNED INTERVENTIONS: self care/ADL training, therapeutic exercise, therapeutic activity, neuromuscular re-education, manual therapy, balance training, functional mobility training, splinting, fluidotherapy, patient/family education, energy conservation, coping strategies training, and DME and/or AE instructions  RECOMMENDED OTHER SERVICES: Patient had outpt PT evaluation today also.  CONSULTED AND AGREED WITH PLAN OF CARE: Patient and family member/caregiver  PLAN FOR NEXT SESSIONs:   Progress Coordination and sensory stimulation activities for HEP  Review dowel HEP and shoulder strengthening and ROM and progress from table top slides to wall slides  Consider use of E-stim to improve AROM and pain.   Progress HEPs for putty (pinch to improve grasp for tying laces).  PRINT and UPDATE theraband HEP for UE strengthening. Pt issued yellow, green and blue therabands for HEP in preparation for D/C at the hospital but images were hand drawn by  his report. Exercises located in inpt OT notes included external rotation, diagonal pulls, bicep curls, triceps extension, shoulder horizontal abduction. Pt demonstrated exercises and verbal understood tasks. Outpt OT to review, update and print. Pt may need folder/binder to keep up with information. (NOT YET COMPLETED DUE TO LACK OF SHOULDER FLEX AROM)  Delana Meyer, OT 11/06/2022, 12:36 PM

## 2022-11-11 ENCOUNTER — Ambulatory Visit: Payer: Self-pay | Attending: Physician Assistant | Admitting: Physical Therapy

## 2022-11-11 ENCOUNTER — Ambulatory Visit: Payer: Self-pay | Admitting: Occupational Therapy

## 2022-11-11 ENCOUNTER — Encounter: Payer: Self-pay | Admitting: Physical Therapy

## 2022-11-11 DIAGNOSIS — R278 Other lack of coordination: Secondary | ICD-10-CM | POA: Insufficient documentation

## 2022-11-11 DIAGNOSIS — M79602 Pain in left arm: Secondary | ICD-10-CM

## 2022-11-11 DIAGNOSIS — R2689 Other abnormalities of gait and mobility: Secondary | ICD-10-CM | POA: Insufficient documentation

## 2022-11-11 DIAGNOSIS — M6281 Muscle weakness (generalized): Secondary | ICD-10-CM | POA: Insufficient documentation

## 2022-11-11 DIAGNOSIS — M62838 Other muscle spasm: Secondary | ICD-10-CM

## 2022-11-11 DIAGNOSIS — R208 Other disturbances of skin sensation: Secondary | ICD-10-CM | POA: Insufficient documentation

## 2022-11-11 DIAGNOSIS — G8254 Quadriplegia, C5-C7 incomplete: Secondary | ICD-10-CM

## 2022-11-11 NOTE — Therapy (Signed)
OUTPATIENT OCCUPATIONAL THERAPY NEURO TREATMENT  Patient Name: Kenneth Flores MRN: [id number] DOB:Feb 02, 1961, 62 y.o., male Today's Date: 11/11/2022  PCP: Norm Salt, [place] REFERRING PROVIDER: Milinda Antis, PA-C  END OF SESSION:  OT End of Session - 11/11/22 1315     Visit Number 8    Number of Visits 16   + Evaluation   Date for OT Re-Evaluation 11/27/22    Authorization Type [employer] 2024 VL: 60 (PT/OT/ST)    OT Start Time 1320    OT Stop Time 1400    OT Time Calculation (min) 40 min    Equipment Utilized During Treatment Testing material    Activity Tolerance Patient tolerated treatment well    Behavior During Therapy WFL for tasks assessed/performed             Past Medical History:  Diagnosis Date   Hepatitis C    Past Surgical History:  Procedure Laterality Date   ANTERIOR CERVICAL DECOMP/DISCECTOMY FUSION N/A 08/21/2022   Procedure: ANTERIOR CERVICAL DECOMPRESSION/DISCECTOMY FUSION CERVICAL THREE-FOUR, CERVICAL FOUR-FIVE, CERVICAL FIVE-SIX;  Surgeon: Julio Sicks, MD;  Location: [facility] OR;  Service: Neurosurgery;  Laterality: N/A;   PELVIC FRACTURE SURGERY Right    Patient Active Problem List   Diagnosis Date Noted   Neurogenic bowel 10/16/2022   Spasticity 10/16/2022   Abnormality of gait 10/16/2022   Acquired left foot drop 10/16/2022   Chronic pain due to trauma 10/16/2022   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) 09/09/2022   Adjustment disorder with depressed mood 08/31/2022   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) 08/27/2022   Cervical spinal cord injury (HCC) 08/19/2022   Chronic hepatitis C without hepatic coma (HCC) 09/08/2021   Thrombocytopenia (HCC) 09/08/2021    ONSET DATE: Referral: 09/17/2022 Hospitalized 08/19/22  REFERRING DIAG: G82.54 (ICD-10-CM) - Quadriplegia, C5-C7 incomplete  THERAPY DIAG:  Pain in left arm  Muscle weakness (generalized)  Other lack of coordination  Other muscle spasm  Incomplete quadriplegia  at C5-6 level (HCC)  Rationale for Evaluation and Treatment: Rehabilitation  SUBJECTIVE:   SUBJECTIVE STATEMENT:  Pt is still states his L arm as being giving him fits all weekend but he reports having trouble getting his Gabapentin prescription filled and has missed it for several days.   Patient reports that he can't tolerate the heat now like he did before.  Pt accompanied by: self   PERTINENT HISTORY:   Hx of chronic Hep C, status post micturition related syncope from vasovagal episode at home on 08/19/2022 resulting in fall with blow to head. Imaging revealed evidence of significant multilevel cervical degenerative disease with associated disc space collapse and associated spondylosis. Patient with critical spinal stenosis with severe cord compression and cord signal change at C3-4. Severe stenosis at C4-5 and C5-6 without marked cord signal abnormality at these levels. There is some changes posteriorly from C3-C6 with some worrisome findings for some posterior ligamentous injury. There was no evidence of fracture on his CT scan. There is no evidence of malalignment. Neurosurgery consulted and admitted the patient for IV steroids and pain control. On 5/30, he was taken to the OR and underwent C3-4, C4-5, C5-6 anterior cervical discectomy with interbody fusion utilizing interbody cages, local harvested autograft, and anterior plate instrumentation. Motor function improved.   In patient Rehab 08/27/2022 - 09/16/2022   Discharge Diagnoses:  Principal Problem:   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-6 level [facility]) Active Problems:   Adjustment disorder with depressed mood Active problems: Functional deficits secondary to incomplete spinal  cord lesion at C5-C7 level Constipation History of chronic hepatitis C Neurogenic bladder with urinary retention Neurogenic bowel Hypertension  PRECAUTIONS: Cervical and Other: soft collar when moving/walking but takes it off at rest and when sleeping  WEIGHT  BEARING RESTRICTIONS: No  PAIN:  Are you having pain? Yes: NPRS scale: 6/10 Pain location: L arm, shoulder and across shoulder/mid-back Pain description: throbbing (leg) vibrating, tingling and numbness Aggravating factors: Overworking it, doing too much Relieving factors: rest/relaxing, oral medication 4x/day, heating pad sometimes   FALLS: Has patient fallen in last 6 months? Yes. Number of falls Previously 2 related to same vasovagal issues - the first fall without injury was a couple of weeks prior to the second fall when he hit his head on the sink   Additional fall around 10/06/22 when his legs did a split while trying to walk at home  LIVING ENVIRONMENT: Lives with: lives with their spouse - [name] Lives in: House/apartment Stairs: Yes: Internal: 14 steps; on right going up, on left going up, and only half way up on the R side; wife walks with him upstairs to the bedrooms Has following equipment at home: Single point cane, Shower bench, and Grab bars  PLOF: Independent, driving, worked as Administrator (x40 hours/week)  PATIENT GOALS: Get my L arm better, tie shoelaces tighter so they stay tied  OBJECTIVE:   HAND DOMINANCE: Right  ADLs: Overall ADLs: Min to mod assist Transfers/ambulation related to ADLs: SBA with cane Eating: Needs help to cut his food Grooming: He tries to shave himself but gets help for the things he can't reach/get to on his own. UB Dressing: He can dress himself with simple clothes but needs extra time but needs help with fasteners LB Dressing: Needs help to get TED hose on and tie shoelaces Toileting: MI but wife is still monitoring him Bathing: Wife assists with his back and is present during showers Tub Shower transfers: Tub transfer bench - wife is there for extra support due to him being wobbly and has been trying to step into the tub with grab bars rather than sitting on the bench all the time Equipment: Transfer tub bench and Grab  bars  IADLs: Shopping: Wife is performing shopping. Light housekeeping: Dependent on family ie) Not able to safely or effectively help take out the trash, mow the lawn etc. Meal Prep: Wife performs this.  He did help with this a little previously. Community mobility: Uses cane with supervision Medication management: No meds previously but now 4x/day.  Using pill box and patient did sort them himself the last time but his wife is checking to make sure they are accurate and calls him to remind him to take his medication as needed (ie if she is working Catering manager). Financial management: Wife is helping more than she needed to previously Handwriting: 50% legible - Patient reports his current writing is not as good as before (about 50% compared to before) but it has improved significantly from when he had to mark/sign with an "X" only.    Patient is never home alone (if his wife is at work, his stepson will be there).  He stays on the bottom level of the home during the daytime and is off the narcotic in the daytime.  He uses his cane in the house  MOBILITY STATUS: Needs Assist: supervision for safety  POSTURE COMMENTS:  forward head Sitting balance: WFL  ACTIVITY TOLERANCE: Activity tolerance: Decreased.  Patient reports falling asleep/napping daily at this time (combo of  medication etc)  FUNCTIONAL OUTCOME MEASURES: Quick Dash: 88.6  (see eval report for complete list of limitations).  UPPER EXTREMITY ROM:    Active ROM Right eval Left eval  Shoulder flexion 100 90  Shoulder abduction 90 75  Shoulder adduction    Shoulder extension Limited end range   Shoulder internal rotation   Shoulder external rotation    Elbow flexion Limited end range  Elbow extension Decreased end range (L side spasmed with stretch  Wrist flexion    Wrist extension    Wrist ulnar deviation    Wrist radial deviation    Wrist pronation    Wrist supination    Digital extension Decreased end range and slow to  open   (Blank rows = not formally tested at eval)  UPPER EXTREMITY MMT:     MMT Right eval Left eval  Shoulder flexion 3- 3-  Shoulder abduction    Shoulder adduction    Shoulder extension    Shoulder internal rotation    Shoulder external rotation    Middle trapezius    Lower trapezius    Elbow flexion 3 3-  Elbow extension    Wrist flexion    Wrist extension    Wrist ulnar deviation    Wrist radial deviation    Wrist pronation    Wrist supination    (Blank rows = not formally tested at eval)  HAND FUNCTION: Grip strength: Right: 21.1, 18.2, 20.0 lbs; Left: 8.5, 5.7, 7.4  lbs Evaluation: Right Avg 19.8 lbs Left Avg 7.2 lbs  11/11/22: Right 29.3, 24.6, 25.3 Left 14.3, 12.1, 12.1  COORDINATION: 9 Hole Peg test: Right: 45.22  sec; Left: 1:40.28  sec   11/11/22 Right: 32.29 sec Left: 45.96 sec  Box and Blocks:  Right 41 blocks, Left 31 blocks  11/11/22 Right: 41 blocks, Left: 35 blocks   SENSATION: Not tested  EDEMA: None noted in UEs  MUSCLE TONE: Patient has some stiffness with ROM  COGNITION: Overall cognitive status: Within functional limits for tasks assessed BIMS from hospital (08/2022) 15/15  VISION: Subjective report: No changes since accident Baseline vision: Bifocals Visual history: NA  VISION ASSESSMENT: Not tested  Patient has no changes or difficulty with any activities due to vision.  PERCEPTION: Not tested  PRAXIS: Not tested  EVALUATION OBSERVATIONS: Patient seen following physical therapy services this afternoon.  He arrived ambulating with a single-point cane.  Patient is accompanied by his wife.  Patient is soft-spoken and noted to refer to his wife for answers throughout session.  He has noted to have limitations in bilateral upper extremity range of motion left greater than right with limited success with overhead reaching and extended time for fine motor tasks with both sides but particularly left upper extremity.   TODAY'S  TREATMENT:                                                                                                                               -  Therapeutic activities completed for reassessment of current abilities with improvements noted in B strength and coordination, as well as activities for progression of ROM and FM skills:  Grip strengths:  Right 29.3, 24.6, 25.3 Average: 26.4 lbs Left 14.3, 12.1, 12.1 Average: 12.8 lbs  9 Hole Peg test:  Right: 32.29 sec  Left: 45.96 sec  Box and Blocks:   Right: 41 blocks,  Left: 35 blocks  Reviewed dowel AAROM HEP per patient's instructions for shoulder flex, shoulder horizontal abduction and adduction, and chest press for improved ROM of affected extremity.   In standing, patient engaged in progressing BUE shoulder ROM as AROM still limited around 90-100 deg of flexion but able to use pillow case to help slide up the wall for > 110 degrees.  Patient encouraged to progress putty gripping to green putty with B UE but to continue with softer putty for pinch and hidden object find.  PATIENT EDUCATION: Education details: Reviewed progress and continued importance of HEPs Person educated: Patient Education method: Explanation, Demonstration, and Verbal cues Education comprehension: verbalized understanding, returned demonstration, verbal cues required, and needs further education  HOME EXERCISE PROGRAM: 10/14/22: Putty Exercises - Access Code: [facility] 10/21/22: Added Table Top slide to HEP - Same Access code 10/23/2022: coordination HEP 10/28/22: Desensitization Program  11/06/2022: dowel HEP  GOALS: Goals reviewed with patient? Yes   SHORT TERM GOALS: Target date: 10/30/22  Patient will improve overhead reaching > 100 degrees for B shoulders to assist with ADLs. Baseline: R 100* L 90* Goal status: IN PROGRESS  2.  Patient will demonstrate at least 30 lbs RUE and 15 lbs LUE grip strength as needed to open containers and carry lightweight objects  without dropping them.  Baseline: Right Avg 19.8 lbs Left Avg 7.2 lbs Goal status: IN PROGRESS 11/11/22: Right 29.3, 24.6, 25.3 Left 14.3, 12.1, 12.1 Average: Right: 26.4 lbs Left: 12.8 lbs  3.  Patient will complete nine-hole peg with use of RUE in 35 seconds or less and LUE 75 seconds or less.  Baseline: 9 Hole Peg test: Right: 45.22  sec; Left: 1:40.28  sec Goal status: MET  11/11/22 Right: 32.29 sec Left: 45.96 sec  4.  Patient will demonstrate UE strengthening HEP (putty/theraband etc) with 25% verbal cues or less for proper execution. Baseline: Has various materials from hospital. Goal status: IN PROGRESS  5.  Patient will demonstrate good digital A/AROM HEP to minimize stiffness in hands (esp L) which impacts coordination ie) in hand manipulation of small objects. Baseline: Stiff digital extension with decreased end range and difficulty with managing pegs for 9 hole peg test ie) dropped items etc.  Goal status: IN PROGRESS  LONG TERM GOALS: Target date: 11/27/22  Patient will demonstrate UE strength and coordination to cut various foods using appropriate AE if necessary. Baseline: Unable to cut food/Assisted by family Goal status: IN PROGRESS  2.  Patient will demonstrate UE pinch strength and coordination to tie shoelaces tightly. Baseline: Shoelaces frequently unfasten Goal status: MET  3.  Patient will demonstrate at least 40 lbs RUE and 20 lbs LUE grip strength as needed to open containers and carry lightweight objects.  Baseline: Right Avg 19.8 lbs Left Avg 7.2 lbs Goal status: IN PROGRESS 11/11/22: Right 29.3, 24.6, 25.3 Left 14.3, 12.1, 12.1 Average: Right: 26.4 lbs Left: 12.8 lbs  4.  Patient will complete nine-hole peg with use of RUE in 30 seconds or less and LUE 40 seconds or less.  Baseline: 9 Hole Peg test: Right: 45.22  sec; Left: 1:40.28  sec Goal status: Revised 11/11/22  11/11/22 Right: 32.29 sec Left: 45.96 sec  5.  Patient will be able to complete simple  food prep activities with good activity tolerance > 15 minutes and safety (ie. Decreased fall risk and implementation of safety precautions). Baseline: Family does all meal prep Goal status: 11/11/22 Discontinued - wife cooks (previously did so also)  6. Patient will demonstrate at least 20% improvement with quick Dash score (reporting <68 % disability or less) indicating improved functional use of affected extremity.   Baseline: 88.6% (10/11 categories as Severe/Unable to perform.  Goal Status: IN Progress   ASSESSMENT:  CLINICAL IMPRESSION: Patient seen today for OT for BUE dysfunction L>R s/p SCI. Patient demonstrates some improvement in strength and coordination (especially manipulation of small object) since eval but remains limited in shoulder AROM. Pt would benefit from continued skilled OT services in the outpatient setting to work on UE impairments to help pt return to PLOF as able.     PERFORMANCE DEFICITS: in functional skills including ADLs, IADLs, coordination, dexterity, proprioception, sensation, tone, ROM, strength, pain, muscle spasms, flexibility, Fine motor control, Gross motor control, mobility, balance, endurance, continence, decreased knowledge of use of DME, and UE functional use, cognitive skills including energy/drive, safety awareness, and thought, and psychosocial skills including coping strategies and routines and behaviors.   IMPAIRMENTS: are limiting patient from ADLs, IADLs, rest and sleep, work, leisure, and social participation.   CO-MORBIDITIES: may have co-morbidities  that affects occupational performance. Patient will benefit from skilled OT to address above impairments and improve overall function.  REHAB POTENTIAL: Good   PLAN:  OT FREQUENCY: 2x/week  OT DURATION: 8 weeks  PLANNED INTERVENTIONS: self care/ADL training, therapeutic exercise, therapeutic activity, neuromuscular re-education, manual therapy, balance training, functional mobility  training, splinting, fluidotherapy, patient/family education, energy conservation, coping strategies training, and DME and/or AE instructions  RECOMMENDED OTHER SERVICES: Patient had outpt PT evaluation today also.  CONSULTED AND AGREED WITH PLAN OF CARE: Patient and family member/caregiver  PLAN FOR NEXT SESSIONs:   Check on cutting food with B UE coordination/strength improvements.  Progress Coordination and sensory stimulation activities for HEP Review dowel HEP and shoulder strengthening and ROM and progress from table top slides to wall slides  Consider use of E-stim to improve AROM and pain. (Patient mentioned home unit - ask to bring if brother got it for him)  PRINT and UPDATE theraband HEP for UE strengthening. Pt issued yellow, green and blue therabands for HEP in preparation for D/C at the hospital but images were hand drawn by his report. Exercises located in inpt OT notes included external rotation, diagonal pulls, bicep curls, triceps extension, shoulder horizontal abduction. Pt demonstrated exercises and verbal understood tasks. Outpt OT to review, update and print. Pt may need folder/binder to keep up with information. (NOT YET COMPLETED DUE TO LACK OF SHOULDER FLEX AROM)  Recheck Quick Dash as appropriate.  Victorino Sparrow, OT 11/11/2022, 2:03 PM

## 2022-11-11 NOTE — Therapy (Signed)
OUTPATIENT PHYSICAL THERAPY NEURO TREATMENT   Patient Name: Kenneth Flores MRN: [id number] DOB:February 28, 1961, 62 y.o., male Today's Date: 11/11/2022   PCP: Norm Salt, [place] REFERRING PROVIDER: Milinda Antis, PA-C  END OF SESSION:  PT End of Session - 11/11/22 1402     Visit Number 7    Number of Visits 17   16 + eval   Date for PT Re-Evaluation 11/27/22   pushed out due to delay in scheduling   Authorization Type [employer] ULTRA    PT Start Time 1400    PT Stop Time 1444    PT Time Calculation (min) 44 min    Equipment Utilized During Treatment Gait belt    Activity Tolerance Patient tolerated treatment well    Behavior During Therapy WFL for tasks assessed/performed              Past Medical History:  Diagnosis Date   Hepatitis C    Past Surgical History:  Procedure Laterality Date   ANTERIOR CERVICAL DECOMP/DISCECTOMY FUSION N/A 08/21/2022   Procedure: ANTERIOR CERVICAL DECOMPRESSION/DISCECTOMY FUSION CERVICAL THREE-FOUR, CERVICAL FOUR-FIVE, CERVICAL FIVE-SIX;  Surgeon: Julio Sicks, MD;  Location: [facility] OR;  Service: Neurosurgery;  Laterality: N/A;   PELVIC FRACTURE SURGERY Right    Patient Active Problem List   Diagnosis Date Noted   Neurogenic bowel 10/16/2022   Spasticity 10/16/2022   Abnormality of gait 10/16/2022   Acquired left foot drop 10/16/2022   Chronic pain due to trauma 10/16/2022   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) 09/09/2022   Adjustment disorder with depressed mood 08/31/2022   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) 08/27/2022   Cervical spinal cord injury (HCC) 08/19/2022   Chronic hepatitis C without hepatic coma (HCC) 09/08/2021   Thrombocytopenia (HCC) 09/08/2021    ONSET DATE: 08/20/2022 (surgery date)  REFERRING DIAG: 09/16/2022  THERAPY DIAG:  Muscle weakness (generalized)  Other lack of coordination  Other muscle spasm  Other disturbances of skin sensation  Other abnormalities of gait and  mobility  Rationale for Evaluation and Treatment: Rehabilitation  SUBJECTIVE:                                                                                                                                                                                             SUBJECTIVE STATEMENT: Patient received from OT.  He states he has had no falls or near falls since last visit.  Left shoulder is feeling more tingly today down the entire arm.  He reports it feels like sandpaper when he is touched and his hand is cold, but this is not new. Pt accompanied by:  significant other-[name] (not present during session)  PERTINENT HISTORY: Chronic Hep C  Patient had syncopal episode in bathroom at home when urinating on 08/19/2022.  When he fell backwards he hit his head.  On 5/30, he was taken to the OR by Dr. Jordan Likes for C3-C6 anterior cervical discectomy and interbody fusion with anterior plate instrumentation secondary to multilevel cervical degenerative disease with disc space collapse, spondylosis and critical spinal stenosis with severe cord compression and signal change at level C3-4.  PAIN:  Are you having pain? Yes: NPRS scale: 7/10 Pain location: left arm Pain description: vibrations and tingling, shooting pains Aggravating factors: random, overworking the arm Relieving factors: pain medicines  PRECAUTIONS: Cervical and Fall-per Dr. Jordan Likes in-basket 7/24 pt has no restrictions.  WEIGHT BEARING RESTRICTIONS: No  FALLS: Has patient fallen in last 6 months? Yes. Number of falls 1-fall that caused surgical need  LIVING ENVIRONMENT: Lives with: lives with their spouse Lives in: House/apartment Stairs: Yes: Internal: 14 steps; on left going up and half of stairs have bilateral rails Has following equipment at home: Single point cane, Shower bench, and Grab bars  PLOF: Requires assistive device for independence, Needs assistance with ADLs, Needs assistance with homemaking, Needs assistance with  gait, and Needs assistance with transfers  PATIENT GOALS: "To try to get better"  OBJECTIVE:   DIAGNOSTIC FINDINGS:  MRI cervical spine 5/29: IMPRESSION: 1. Positive for evidence of both Ligamentous Injury, and Abnormal Cervical Spinal Cord in the setting of trauma: - Prevertebral edema or fluid from C1 through C6. Consider anterior longitudinal ligament injury, although no discrete ligamentous disruption is identified. - mild posterior interspinous ligamentous injury at C3-C4 eccentric to the right. - Abnormal spinal cord signal from C3 to the C5 cord level. While some of this most resembles chronic Myelomalacia (see #2), a component of Acute Cord Contusion cannot be excluded - especially in the left central cord at C3.   *Underlying bulky cervical spine degeneration with spinal stenosis AND spinal cord mass effect from C3-C4 (Moderate) through C5-C6 (mild). Associated moderate to severe degenerative neural foraminal stenosis at the bilateral C4, C5, and left C6 nerve levels.   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  COGNITION: Overall cognitive status:  Wife answers for pt primarily, pt appears to have delayed processing and requires repetition of questions.   SENSATION: Light touch: WFL and pt reporting tingling all the way down the left leg  COORDINATION: LE RAMS:  slow and deliberate BLE heel-to-shin:  limited due to functional weakness  EDEMA:  None noted in BLE, pt and wife deny swelling at home, he is wearing bilateral compression stockings for DVT prevention per wife-she states she will ask MD if these are necessary any longer.  MUSCLE TONE: Questionable left clonus  POSTURE: forward head-wearing soft cervical collar  LOWER EXTREMITY ROM/MMT:     Active  Right Eval Left Eval  Hip flexion 3+/5 2+/5  Hip extension    Hip abduction    Hip adduction    Hip internal rotation    Hip external rotation    Knee  flexion    Knee extension 4-/5 3/5  Ankle dorsiflexion 4/5 3/5  Ankle plantarflexion    Ankle inversion    Ankle eversion     (Blank rows = not tested)  BED MOBILITY:  Sit to supine Complete Independence Supine to sit Complete Independence Rolling to Right Complete Independence Rolling to Left Complete Independence *Pain with rolling  TRANSFERS: Assistive device utilized: Single point cane  Sit to stand: SBA Stand to sit: SBA Chair to chair: SBA  GAIT: Gait pattern: step through pattern, decreased arm swing- Left, decreased step length- Left, decreased stride length, and narrow BOS Distance walked: various clinic distances Assistive device utilized: Single point cane Level of assistance: SBA  FUNCTIONAL TESTS:  5 times sit to stand: 27.91 seconds w/ BUE support 10 meter walk test: 14.82 seconds w/ SPC = 0.67 m/sec OR 2.23 ft/sec Berg Balance Scale: To be assessed.  PATIENT SURVEYS:  None completed due to time.  TODAY'S TREATMENT:                                                                                                                              DATE: 11/11/2022 -SciFit x8 minutes using BUE/BLE in sprint mode on level 4.0 for cardiovascular conditioning and LE strengthening and general reciprocal ROM. -Bilateral leg press at 60 lbs 2x12 > single leg press at 40 lbs 2x10 each LE  STAIRS:  Level of Assistance: SBA and CGA  Stair Negotiation Technique: Step to Pattern Forwards With use of AD: SPC  with Single Rail on Left  Number of Stairs: 4x3   Height of Stairs: 6 inch  Comments: Educated patient on using LLE during descent for improved stability especially when fatigued.  He is able to perform stairs with step to pattern and just cane (no rail) and light CGA on last attempt.  GAIT: Gait pattern: step through pattern, decreased arm swing- Left, and decreased stride length Distance walked: 430 ft (level indoor) + 250 ft (unlevel sidewalk) Assistive device  utilized: Single point cane Level of assistance: SBA and CGA Comments: Pt has some trunk sway mostly when in left stance.  No knee instability noted, but patient appears fatigued with increased distance noted with slight bouncing in LLE stance.  He does well checking for foot placement and then looking up and out.  Patient outside ambulating over unlevel sidewalk for second bout of gait.  Noted increased hyperextension of left knee during stance particularly with incline requiring CGA as PT unsure of pt stability.  PATIENT EDUCATION: Education details: Continue HEP and walking program.  Discussed safety with using cane as he does at home and to use rollator when fatigued as with long distances or when having notable increased tone.  Planning to progress patient back to [facility] full-time over remaining visits as safely able.  Discussed hypertonicity contributing to feeling of toes curling under when too cold or inactive.  Encouraged patient to continue following up about Gabapentin refill. Person educated: Patient Education method: Explanation Education comprehension: verbalized understanding and needs further education  HOME EXERCISE PROGRAM: Access Code: [email or web address] URL: [email or web address] Date: 10/09/2022 Prepared by: Camille Bal  Exercises - Side Stepping with Resistance at Thighs and Counter Support  - 1 x daily - 5 x weekly - 3 sets - 10 reps - Standing Tandem Balance with Counter Support  -  1 x daily - 5 x weekly - 1 sets - 4 reps - 30 seconds hold - Sit to Stand with Resistance Around Legs  - 1 x daily - 5 x weekly - 1-2 sets - 10 reps - Heel Raises with Counter Support  - 1 x daily - 5 x weekly - 2 sets - 12 reps - Supine Chin Tuck  - 1 x daily - 5 x weekly - 3 sets - 10 reps  -Cervical AROM in all planes as tolerated  You Can Walk For A Certain Length Of Time Each Day (use rollator for safety and have wife nearby for supervision)                          Walk 4-5  minutes 2-3 times per day.             Increase 2-3  minutes every week.             Work up to 20 minutes continuously once per day.               Example:                         Day 1-2           4-5 minutes     3 times per day                         Day 7-8           10-12 minutes 2-3 times per day                         Day 13-14       20-22 minutes 1-2 times per day  GOALS: Goals reviewed with patient? Yes  SHORT TERM GOALS: Target date: 10/23/2022  Pt will be independent and compliant with introductory strength and balance HEP in order to maintain functional progress and improve mobility. Baseline:  Patient compliant (8/7) Goal status: MET  2.  Pt will decrease 5xSTS to </=22.91 seconds in order to demonstrate decreased risk for falls and improved functional bilateral LE strength and power. Baseline: 27.91 seconds w/ BUE support; 24.41 seconds w/ BUE support (8/7) Goal status: IN PROGRESS  3.  Pt will demonstrate a gait speed of >/=0.8 m/sec in order to decrease risk for falls. Baseline: 0.67 m/sec; 0.63 m/sec OR 2.06 ft/sec w/ rollator (8/7) Goal status: NOT MET  4.  Pt will increase BERG balance score to >/=40/56 to demonstrate improved static balance. Baseline: 36/56 (7/19); 42/56 (8/7) Goal status: MET  LONG TERM GOALS: Target date: 11/20/2022  Pt will be independent and compliant with advanced strength and balance HEP in order to maintain functional progress and improve mobility. Baseline:  To be established. Goal status: INITIAL  2.  Pt will decrease 5xSTS to </=17.91 seconds in order to demonstrate decreased risk for falls and improved functional bilateral LE strength and power. Baseline: 27.91 seconds w/ BUE support Goal status: INITIAL  3.  Pt will demonstrate a gait speed of >/=0.93 m/sec in order to decrease risk for falls. Baseline: 0.67 m/sec Goal status: INITIAL  4.  Pt will increase BERG balance score to >/=44/56 to demonstrate improved static  balance. Baseline: 36/56 (7/19) Goal status: INITIAL  5.  Pt will ambulate >/=500 feet independently over unlevel surface to  promote household and community access. Baseline: inside clinic level distance w/ SPC SBA Goal status: INITIAL  6.  Patient will report average 7 day pain of </=5/10 at rest in order to demonstrate improved quality of life. Baseline: 7/10 shoulders and left arm at rest Goal status: INITIAL  ASSESSMENT:  CLINICAL IMPRESSION: Emphasis of skilled session on addressing gait training with SPC to return patient to device safely over time.  He has some more notable left knee hyperextension today which was addressed with single leg press task and gait training.  He is having more tingling in the left hemibody, especially the arm, since a delay in his Gabapentin refill.  Despite these things, he appears to be tolerating more activities and demonstrating improved function of the LLE.  Will plan to address shoulder pain, gait training, and dynamic balance at future session.  He is due for LTG assessment soon and PT considering re-cert based on potential for further progress.  OBJECTIVE IMPAIRMENTS: Abnormal gait, decreased activity tolerance, decreased balance, decreased cognition, decreased coordination, decreased endurance, decreased knowledge of condition, difficulty walking, decreased ROM, decreased strength, impaired sensation, impaired tone, improper body mechanics, postural dysfunction, and pain.   ACTIVITY LIMITATIONS: carrying, lifting, bending, standing, squatting, stairs, transfers, bed mobility, bathing, reach over head, and locomotion level  PARTICIPATION LIMITATIONS: meal prep, cleaning, laundry, medication management, driving, shopping, community activity, occupation, and yard work  PERSONAL FACTORS: Age, Fitness, Past/current experiences, Transportation, and 1 comorbidity: chronic Hep C  are also affecting patient's functional outcome.   REHAB POTENTIAL:  Good  CLINICAL DECISION MAKING: Evolving/moderate complexity  EVALUATION COMPLEXITY: Moderate  PLAN:  PT FREQUENCY: 1-2x/week (scheduling for 1x/wk initially until out of cervical collar)  PT DURATION: 8 weeks  PLANNED INTERVENTIONS: Therapeutic exercises, Therapeutic activity, Neuromuscular re-education, Balance training, Gait training, Patient/Family education, Self Care, Stair training, Vestibular training, Orthotic/Fit training, DME instructions, scar mobilization, Manual therapy, and Re-evaluation  PLAN FOR NEXT SESSION: ASSESS LTGs - re-cert!  Modify strength and balance HEP prn.   Gait training w/ vs w/o SPC vs rollator for long/unlevel.  Endurance.  LLE NMR-leg press (try single), hurdles, unsupported static balance, periscapular strength, IASTM for left shoulder again  Sadie Haber, PT, DPT 11/11/2022, 3:04 PM

## 2022-11-13 ENCOUNTER — Ambulatory Visit: Payer: Self-pay | Admitting: Occupational Therapy

## 2022-11-13 DIAGNOSIS — R278 Other lack of coordination: Secondary | ICD-10-CM

## 2022-11-13 DIAGNOSIS — R29898 Other symptoms and signs involving the musculoskeletal system: Secondary | ICD-10-CM

## 2022-11-13 DIAGNOSIS — R208 Other disturbances of skin sensation: Secondary | ICD-10-CM

## 2022-11-13 DIAGNOSIS — M6281 Muscle weakness (generalized): Secondary | ICD-10-CM

## 2022-11-13 DIAGNOSIS — G8254 Quadriplegia, C5-C7 incomplete: Secondary | ICD-10-CM

## 2022-11-13 DIAGNOSIS — M79602 Pain in left arm: Secondary | ICD-10-CM

## 2022-11-13 NOTE — Therapy (Signed)
OUTPATIENT OCCUPATIONAL THERAPY NEURO TREATMENT  Patient Name: Kenneth Flores MRN: [id number] DOB:10-07-1960, 62 y.o., male Today's Date: 11/13/2022  PCP: Norm Salt, [place] REFERRING PROVIDER: Milinda Antis, PA-C  END OF SESSION:  OT End of Session - 11/13/22 1238     Visit Number 9    Number of Visits 16   + Evaluation   Date for OT Re-Evaluation 11/27/22    Authorization Type [employer] 2024 VL: 60 (PT/OT/ST)    OT Start Time 1239    OT Stop Time 1317    OT Time Calculation (min) 38 min    Activity Tolerance Patient tolerated treatment well    Behavior During Therapy WFL for tasks assessed/performed             Past Medical History:  Diagnosis Date   Hepatitis C    Past Surgical History:  Procedure Laterality Date   ANTERIOR CERVICAL DECOMP/DISCECTOMY FUSION N/A 08/21/2022   Procedure: ANTERIOR CERVICAL DECOMPRESSION/DISCECTOMY FUSION CERVICAL THREE-FOUR, CERVICAL FOUR-FIVE, CERVICAL FIVE-SIX;  Surgeon: Julio Sicks, MD;  Location: [facility] OR;  Service: Neurosurgery;  Laterality: N/A;   PELVIC FRACTURE SURGERY Right    Patient Active Problem List   Diagnosis Date Noted   Neurogenic bowel 10/16/2022   Spasticity 10/16/2022   Abnormality of gait 10/16/2022   Acquired left foot drop 10/16/2022   Chronic pain due to trauma 10/16/2022   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) 09/09/2022   Adjustment disorder with depressed mood 08/31/2022   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) 08/27/2022   Cervical spinal cord injury (HCC) 08/19/2022   Chronic hepatitis C without hepatic coma (HCC) 09/08/2021   Thrombocytopenia (HCC) 09/08/2021    ONSET DATE: Referral: 09/17/2022 Hospitalized 08/19/22  REFERRING DIAG: G82.54 (ICD-10-CM) - Quadriplegia, C5-C7 incomplete  THERAPY DIAG:  Muscle weakness (generalized)  Other lack of coordination  Pain in left arm  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   Pt accompanied by: self   PERTINENT HISTORY:   Hx of chronic Hep C, status post micturition related syncope from vasovagal episode at home on 08/19/2022 resulting in fall with blow to head. Imaging revealed evidence of significant multilevel cervical degenerative disease with associated disc space collapse and associated spondylosis. Patient with critical spinal stenosis with severe cord compression and cord signal change at C3-4. Severe stenosis at C4-5 and C5-6 without marked cord signal abnormality at these levels. There is some changes posteriorly from C3-C6 with some worrisome findings for some posterior ligamentous injury. There was no evidence of fracture on his CT scan. There is no evidence of malalignment. Neurosurgery consulted and admitted the patient for IV steroids and pain control. On 5/30, he was taken to the OR and underwent C3-4, C4-5, C5-6 anterior cervical discectomy with interbody fusion utilizing interbody cages, local harvested autograft, and anterior plate instrumentation. Motor function improved.   In patient Rehab 08/27/2022 - 09/16/2022   Discharge Diagnoses:  Principal Problem:   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-6 level [facility]) Active Problems:   Adjustment disorder with depressed mood Active problems: Functional deficits secondary to incomplete spinal cord lesion at C5-C7 level Constipation History of chronic hepatitis C Neurogenic bladder with urinary retention Neurogenic bowel Hypertension  PRECAUTIONS: Cervical and Other: soft collar  when moving/walking but takes it off at rest and when sleeping  WEIGHT BEARING RESTRICTIONS: No  PAIN:  Are you having pain? Yes: NPRS scale: 8/10 Pain location: L arm, shoulder and across  shoulder/mid-back Pain description: throbbing (leg) vibrating, tingling and numbness Aggravating factors: Overworking it, doing too much Relieving factors: rest/relaxing, oral medication 4x/day, heating pad sometimes   FALLS: Has patient fallen in last 6 months? Yes. Number of falls Previously 2 related to same vasovagal issues - the first fall without injury was a couple of weeks prior to the second fall when he hit his head on the sink   Additional fall around 10/06/22 when his legs did a split while trying to walk at home  LIVING ENVIRONMENT: Lives with: lives with their spouse - [name] Lives in: House/apartment Stairs: Yes: Internal: 14 steps; on right going up, on left going up, and only half way up on the R side; wife walks with him upstairs to the bedrooms Has following equipment at home: Single point cane, Shower bench, and Grab bars  PLOF: Independent, driving, worked as Administrator (x40 hours/week)  PATIENT GOALS: Get my L arm better, tie shoelaces tighter so they stay tied  OBJECTIVE:   HAND DOMINANCE: Right  ADLs: Overall ADLs: Min to mod assist Transfers/ambulation related to ADLs: SBA with cane Eating: Needs help to cut his food Grooming: He tries to shave himself but gets help for the things he can't reach/get to on his own. UB Dressing: He can dress himself with simple clothes but needs extra time but needs help with fasteners LB Dressing: Needs help to get TED hose on and tie shoelaces Toileting: MI but wife is still monitoring him Bathing: Wife assists with his back and is present during showers Tub Shower transfers: Tub transfer bench - wife is there for extra support due to him being wobbly and has been trying to step into the tub with grab bars rather than sitting on the bench all the time Equipment: Transfer tub bench and Grab bars  IADLs: Shopping: Wife is performing shopping. Light housekeeping: Dependent on family ie) Not able to safely or effectively help  take out the trash, mow the lawn etc. Meal Prep: Wife performs this.  He did help with this a little previously. Community mobility: Uses cane with supervision Medication management: No meds previously but now 4x/day.  Using pill box and patient did sort them himself the last time but his wife is checking to make sure they are accurate and calls him to remind him to take his medication as needed (ie if she is working Catering manager). Financial management: Wife is helping more than she needed to previously Handwriting: 50% legible - Patient reports his current writing is not as good as before (about 50% compared to before) but it has improved significantly from when he had to mark/sign with an "X" only.    Patient is never home alone (if his wife is at work, his stepson will be there).  He stays on the bottom level of the home during the daytime and is off the narcotic in the daytime.  He uses his cane in the house  MOBILITY STATUS: Needs Assist: supervision for safety  POSTURE COMMENTS:  forward head Sitting balance: WFL  ACTIVITY TOLERANCE: Activity tolerance: Decreased.  Patient reports falling asleep/napping daily at this time (combo of medication etc)  FUNCTIONAL OUTCOME MEASURES: Quick Dash: 88.6  (see eval report for complete list of limitations).  UPPER EXTREMITY ROM:    Active  ROM Right eval Left eval  Shoulder flexion 100 90  Shoulder abduction 90 75  Shoulder adduction    Shoulder extension Limited end range   Shoulder internal rotation   Shoulder external rotation    Elbow flexion Limited end range  Elbow extension Decreased end range (L side spasmed with stretch  Wrist flexion    Wrist extension    Wrist ulnar deviation    Wrist radial deviation    Wrist pronation    Wrist supination    Digital extension Decreased end range and slow to open   (Blank rows = not formally tested at eval)  UPPER EXTREMITY MMT:     MMT Right eval Left eval  Shoulder flexion 3- 3-   Shoulder abduction    Shoulder adduction    Shoulder extension    Shoulder internal rotation    Shoulder external rotation    Middle trapezius    Lower trapezius    Elbow flexion 3 3-  Elbow extension    Wrist flexion    Wrist extension    Wrist ulnar deviation    Wrist radial deviation    Wrist pronation    Wrist supination    (Blank rows = not formally tested at eval)  HAND FUNCTION: Grip strength: Right: 21.1, 18.2, 20.0 lbs; Left: 8.5, 5.7, 7.4  lbs Evaluation: Right Avg 19.8 lbs Left Avg 7.2 lbs  11/11/22: Right 29.3, 24.6, 25.3 Left 14.3, 12.1, 12.1  COORDINATION: 9 Hole Peg test: Right: 45.22  sec; Left: 1:40.28  sec   11/11/22 Right: 32.29 sec Left: 45.96 sec  Box and Blocks:  Right 41 blocks, Left 31 blocks  11/11/22 Right: 41 blocks, Left: 35 blocks   SENSATION: Not tested  EDEMA: None noted in UEs  MUSCLE TONE: Patient has some stiffness with ROM  COGNITION: Overall cognitive status: Within functional limits for tasks assessed BIMS from hospital (08/2022) 15/15  VISION: Subjective report: No changes since accident Baseline vision: Bifocals Visual history: NA  VISION ASSESSMENT: Not tested  Patient has no changes or difficulty with any activities due to vision.  PERCEPTION: Not tested  PRAXIS: Not tested  EVALUATION OBSERVATIONS: Patient seen following physical therapy services this afternoon.  He arrived ambulating with a single-point cane.  Patient is accompanied by his wife.  Patient is soft-spoken and noted to refer to his wife for answers throughout session.  He has noted to have limitations in bilateral upper extremity range of motion left greater than right with limited success with overhead reaching and extended time for fine motor tasks with both sides but particularly left upper extremity.   TODAY'S TREATMENT:                                                                                                                               -  Therapeutic activities completed for duration as noted below including: Proprioceptive push of green putty on top of and at side of  table with LUE for improved pain tolerance and strength.   With use of L, pt placed and then removed colored, medium pegs into corresponding hole with use of pattern sheets for ROM, coordination, and strength of affected extremity. Peg board placed at shoulder level for increased challenge to ROM. Pt removed 3 pegs at a time, stored them, in L hand, then translated pegs to tips of fingers before placing them individually back into the container.   Placement and removal of yellow, red, green, blue, and black resistive clips with use of right 3 point pinch for strengthening of affected extremity.  Placement and removal of yellow, red, green, blue, and black resistive clips with use of left 3 point pinch for strengthening of affected extremity. Pt able to place yellow, red, and green clips vertically for additional challenge to shoulder ROM. Cues to reduce shoulder hike.  PATIENT EDUCATION: Education details: ROM and strength Person educated: Patient Education method: Explanation, Demonstration, and Verbal cues Education comprehension: verbalized understanding, returned demonstration, verbal cues required, and needs further education  HOME EXERCISE PROGRAM: 10/14/22: Putty Exercises - Access Code: [facility] 10/21/22: Added Table Top slide to HEP - Same Access code 10/23/2022: coordination HEP 10/28/22: Desensitization Program  11/06/2022: dowel HEP  GOALS: Goals reviewed with patient? Yes   SHORT TERM GOALS: Target date: 10/30/22  Patient will improve overhead reaching > 100 degrees for B shoulders to assist with ADLs. Baseline: R 100* L 90* Goal status: IN PROGRESS  2.  Patient will demonstrate at least 30 lbs RUE and 15 lbs LUE grip strength as needed to open containers and carry lightweight objects without dropping them.  Baseline: Right Avg 19.8 lbs Left Avg 7.2  lbs Goal status: IN PROGRESS 11/11/22: Right 29.3, 24.6, 25.3 Left 14.3, 12.1, 12.1 Average: Right: 26.4 lbs Left: 12.8 lbs  3.  Patient will complete nine-hole peg with use of RUE in 35 seconds or less and LUE 75 seconds or less.  Baseline: 9 Hole Peg test: Right: 45.22  sec; Left: 1:40.28  sec Goal status: MET  11/11/22 Right: 32.29 sec Left: 45.96 sec  4.  Patient will demonstrate UE strengthening HEP (putty/theraband etc) with 25% verbal cues or less for proper execution. Baseline: Has various materials from hospital. Goal status: IN PROGRESS  5.  Patient will demonstrate good digital A/AROM HEP to minimize stiffness in hands (esp L) which impacts coordination ie) in hand manipulation of small objects. Baseline: Stiff digital extension with decreased end range and difficulty with managing pegs for 9 hole peg test ie) dropped items etc.  Goal status: IN PROGRESS  LONG TERM GOALS: Target date: 11/27/22  Patient will demonstrate UE strength and coordination to cut various foods using appropriate AE if necessary. Baseline: Unable to cut food/Assisted by family Goal status: IN PROGRESS  2.  Patient will demonstrate UE pinch strength and coordination to tie shoelaces tightly. Baseline: Shoelaces frequently unfasten Goal status: MET  3.  Patient will demonstrate at least 40 lbs RUE and 20 lbs LUE grip strength as needed to open containers and carry lightweight objects.  Baseline: Right Avg 19.8 lbs Left Avg 7.2 lbs Goal status: IN PROGRESS 11/11/22: Right 29.3, 24.6, 25.3 Left 14.3, 12.1, 12.1 Average: Right: 26.4 lbs Left: 12.8 lbs  4.  Patient will complete nine-hole peg with use of RUE in 30 seconds or less and LUE 40 seconds or less.  Baseline: 9 Hole Peg test: Right: 45.22  sec; Left: 1:40.28  sec Goal status: Revised 11/11/22  11/11/22  Right: 32.29 sec Left: 45.96 sec  5.  Patient will be able to complete simple food prep activities with good activity tolerance > 15 minutes and  safety (ie. Decreased fall risk and implementation of safety precautions). Baseline: Family does all meal prep Goal status: 11/11/22 Discontinued - wife cooks (previously did so also)  6. Patient will demonstrate at least 20% improvement with quick Dash score (reporting <68 % disability or less) indicating improved functional use of affected extremity.   Baseline: 88.6% (10/11 categories as Severe/Unable to perform.  Goal Status: IN Progress   ASSESSMENT:  CLINICAL IMPRESSION: Patient seen today for OT for BUE dysfunction L>R s/p SCI. Patient demonstrates good tolerance to activities today despite reported LUE pain.    PERFORMANCE DEFICITS: in functional skills including ADLs, IADLs, coordination, dexterity, proprioception, sensation, tone, ROM, strength, pain, muscle spasms, flexibility, Fine motor control, Gross motor control, mobility, balance, endurance, continence, decreased knowledge of use of DME, and UE functional use, cognitive skills including energy/drive, safety awareness, and thought, and psychosocial skills including coping strategies and routines and behaviors.   IMPAIRMENTS: are limiting patient from ADLs, IADLs, rest and sleep, work, leisure, and social participation.   CO-MORBIDITIES: may have co-morbidities  that affects occupational performance. Patient will benefit from skilled OT to address above impairments and improve overall function.  REHAB POTENTIAL: Good   PLAN:  OT FREQUENCY: 2x/week  OT DURATION: 8 weeks  PLANNED INTERVENTIONS: self care/ADL training, therapeutic exercise, therapeutic activity, neuromuscular re-education, manual therapy, balance training, functional mobility training, splinting, fluidotherapy, patient/family education, energy conservation, coping strategies training, and DME and/or AE instructions  RECOMMENDED OTHER SERVICES: Patient had outpt PT evaluation today also.  CONSULTED AND AGREED WITH PLAN OF CARE: Patient and family  member/caregiver  PLAN FOR NEXT SESSIONs:   Check on cutting food with B UE coordination/strength improvements.  Progress Coordination and sensory stimulation activities for HEP Review dowel HEP and shoulder strengthening and ROM and progress from table top slides to wall slides  Consider use of E-stim to improve AROM and pain. (Patient mentioned home unit - ask to bring if brother got it for him)  PRINT and UPDATE theraband HEP for UE strengthening. Pt issued yellow, green and blue therabands for HEP in preparation for D/C at the hospital but images were hand drawn by his report. Exercises located in inpt OT notes included external rotation, diagonal pulls, bicep curls, triceps extension, shoulder horizontal abduction. Pt demonstrated exercises and verbal understood tasks. Outpt OT to review, update and print. Pt may need folder/binder to keep up with information. (NOT YET COMPLETED DUE TO LACK OF SHOULDER FLEX AROM)  Recheck Quick Dash as appropriate.  Delana Meyer, OT 11/13/2022, 1:11 PM

## 2022-11-18 ENCOUNTER — Ambulatory Visit: Payer: Self-pay | Admitting: Physical Therapy

## 2022-11-18 ENCOUNTER — Other Ambulatory Visit: Payer: Self-pay | Admitting: Physical Medicine and Rehabilitation

## 2022-11-18 ENCOUNTER — Ambulatory Visit: Payer: Self-pay | Admitting: Occupational Therapy

## 2022-11-18 ENCOUNTER — Encounter: Payer: Self-pay | Admitting: Physical Therapy

## 2022-11-18 DIAGNOSIS — R208 Other disturbances of skin sensation: Secondary | ICD-10-CM

## 2022-11-18 DIAGNOSIS — R278 Other lack of coordination: Secondary | ICD-10-CM

## 2022-11-18 DIAGNOSIS — G8254 Quadriplegia, C5-C7 incomplete: Secondary | ICD-10-CM

## 2022-11-18 DIAGNOSIS — M79602 Pain in left arm: Secondary | ICD-10-CM

## 2022-11-18 DIAGNOSIS — M6281 Muscle weakness (generalized): Secondary | ICD-10-CM

## 2022-11-18 NOTE — Therapy (Signed)
OUTPATIENT OCCUPATIONAL THERAPY NEURO TREATMENT  Patient Name: Kenneth Flores MRN: [id number] DOB:[birth year], 62 y.o., male Today's Date: 11/18/2022  PCP: Norm Salt, [place] REFERRING PROVIDER: Milinda Antis, PA-C  END OF SESSION:  OT End of Session - 11/18/22 1315     Visit Number 10    Number of Visits 16   + Evaluation   Date for OT Re-Evaluation 11/27/22    Authorization Type [employer] 2024 VL: 60 (PT/OT/ST)    OT Start Time 1316    OT Stop Time 1400    OT Time Calculation (min) 44 min    Equipment Utilized During Treatment Over the door pulley    Activity Tolerance Patient tolerated treatment well    Behavior During Therapy WFL for tasks assessed/performed             Past Medical History:  Diagnosis Date   Hepatitis C    Past Surgical History:  Procedure Laterality Date   ANTERIOR CERVICAL DECOMP/DISCECTOMY FUSION N/A 08/21/2022   Procedure: ANTERIOR CERVICAL DECOMPRESSION/DISCECTOMY FUSION CERVICAL THREE-FOUR, CERVICAL FOUR-FIVE, CERVICAL FIVE-SIX;  Surgeon: Julio Sicks, MD;  Location: [facility] OR;  Service: Neurosurgery;  Laterality: N/A;   PELVIC FRACTURE SURGERY Right    Patient Active Problem List   Diagnosis Date Noted   Neurogenic bowel 10/16/2022   Spasticity 10/16/2022   Abnormality of gait 10/16/2022   Acquired left foot drop 10/16/2022   Chronic pain due to trauma 10/16/2022   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) 09/09/2022   Adjustment disorder with depressed mood 08/31/2022   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   Pt accompanied by: self   PERTINENT HISTORY:   Hx of chronic Hep C, status post micturition related syncope from vasovagal episode at home on 08/19/2022 resulting in fall with blow to head. Imaging revealed evidence of significant multilevel cervical degenerative disease with associated disc space collapse and associated spondylosis. Patient with critical spinal stenosis with severe cord compression and cord signal change at C3-4. Severe stenosis at C4-5 and C5-6 without marked cord signal abnormality at these levels. There is some changes posteriorly from C3-C6 with some worrisome findings for some posterior ligamentous injury. There was no evidence of fracture on his CT scan. There is no evidence of malalignment. Neurosurgery consulted and admitted the patient for IV steroids and pain control. On 5/30, he was taken to the OR and underwent C3-4, C4-5, C5-6 anterior cervical discectomy with interbody fusion utilizing interbody cages, local harvested autograft, and anterior plate instrumentation. Motor function improved.   In patient Rehab 08/27/2022 - 09/16/2022   Discharge Diagnoses:  Principal Problem:   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) Active Problems:   Adjustment disorder with depressed mood Active problems: Functional deficits secondary to incomplete spinal cord lesion at C5-C7 level Constipation History of chronic hepatitis C Neurogenic bladder with urinary retention Neurogenic bowel Hypertension  PRECAUTIONS: Cervical and Other: soft collar when moving/walking but takes it off at rest and  when sleeping  WEIGHT BEARING RESTRICTIONS: No  PAIN:  Are you having pain? Yes: NPRS scale: 8/10 Pain location: L arm, shoulder and across shoulder/mid-back Pain description: throbbing (leg) vibrating, tingling  and numbness Aggravating factors: Overworking it, doing too much Relieving factors: rest/relaxing, oral medication 4x/day, heating pad sometimes   FALLS: Has patient fallen in last 6 months? Yes. Number of falls Previously 2 related to same vasovagal issues - the first fall without injury was a couple of weeks prior to the second fall when he hit his head on the sink   Additional fall around 10/06/22 when his legs did a split while trying to walk at home  LIVING ENVIRONMENT: Lives with: lives with their spouse - [name] Lives in: House/apartment Stairs: Yes: Internal: 14 steps; on right going up, on left going up, and only half way up on the R side; wife walks with him upstairs to the bedrooms Has following equipment at home: Single point cane, Shower bench, and Grab bars  PLOF: Independent, driving, worked as Administrator (x40 hours/week)  PATIENT GOALS: Get my L arm better, tie shoelaces tighter so they stay tied  OBJECTIVE:   HAND DOMINANCE: Right  ADLs: Overall ADLs: Min to mod assist Transfers/ambulation related to ADLs: SBA with cane Eating: Needs help to cut his food Grooming: He tries to shave himself but gets help for the things he can't reach/get to on his own. UB Dressing: He can dress himself with simple clothes but needs extra time but needs help with fasteners LB Dressing: Needs help to get TED hose on and tie shoelaces Toileting: MI but wife is still monitoring him Bathing: Wife assists with his back and is present during showers Tub Shower transfers: Tub transfer bench - wife is there for extra support due to him being wobbly and has been trying to step into the tub with grab bars rather than sitting on the bench all the time Equipment: Transfer tub bench and Grab bars  IADLs: Shopping: Wife is performing shopping. Light housekeeping: Dependent on family ie) Not able to safely or effectively help take out the trash, mow the lawn etc. Meal Prep: Wife performs this.  He  did help with this a little previously. Community mobility: Uses cane with supervision Medication management: No meds previously but now 4x/day.  Using pill box and patient did sort them himself the last time but his wife is checking to make sure they are accurate and calls him to remind him to take his medication as needed (ie if she is working Catering manager). Financial management: Wife is helping more than she needed to previously Handwriting: 50% legible - Patient reports his current writing is not as good as before (about 50% compared to before) but it has improved significantly from when he had to mark/sign with an "X" only.    Patient is never home alone (if his wife is at work, his stepson will be there).  He stays on the bottom level of the home during the daytime and is off the narcotic in the daytime.  He uses his cane in the house  MOBILITY STATUS: Needs Assist: supervision for safety  POSTURE COMMENTS:  forward head Sitting balance: WFL  ACTIVITY TOLERANCE: Activity tolerance: Decreased.  Patient reports falling asleep/napping daily at this time (combo of medication etc)  FUNCTIONAL OUTCOME MEASURES: Quick Dash: 88.6  (see eval report for complete list of limitations).  11/17/22 Quick Dash: 43.2   UPPER EXTREMITY ROM:    Active ROM Right eval  Left eval  Shoulder flexion 100 90  Shoulder abduction 90 75  Shoulder adduction    Shoulder extension Limited end range   Shoulder internal rotation   Shoulder external rotation    Elbow flexion Limited end range  Elbow extension Decreased end range (L side spasmed with stretch  Wrist flexion    Wrist extension    Wrist ulnar deviation    Wrist radial deviation    Wrist pronation    Wrist supination    Digital extension Decreased end range and slow to open   (Blank rows = not formally tested at eval)  UPPER EXTREMITY MMT:     MMT Right eval Left eval  Shoulder flexion 3- 3-  Shoulder abduction    Shoulder adduction     Shoulder extension    Shoulder internal rotation    Shoulder external rotation    Middle trapezius    Lower trapezius    Elbow flexion 3 3-  Elbow extension    Wrist flexion    Wrist extension    Wrist ulnar deviation    Wrist radial deviation    Wrist pronation    Wrist supination    (Blank rows = not formally tested at eval)  HAND FUNCTION: Grip strength: Right: 21.1, 18.2, 20.0 lbs; Left: 8.5, 5.7, 7.4  lbs Evaluation: Right Avg 19.8 lbs Left Avg 7.2 lbs  11/11/22: Right 29.3, 24.6, 25.3 Left 14.3, 12.1, 12.1  COORDINATION: 9 Hole Peg test: Right: 45.22  sec; Left: 1:40.28  sec   11/11/22 Right: 32.29 sec Left: 45.96 sec  Box and Blocks:  Right 41 blocks, Left 31 blocks  11/11/22 Right: 41 blocks, Left: 35 blocks   SENSATION: Not tested  EDEMA: None noted in UEs  MUSCLE TONE: Patient has some stiffness with ROM  COGNITION: Overall cognitive status: Within functional limits for tasks assessed BIMS from hospital (08/2022) 15/15  VISION: Subjective report: No changes since accident Baseline vision: Bifocals Visual history: NA  VISION ASSESSMENT: Not tested  Patient has no changes or difficulty with any activities due to vision.  PERCEPTION: Not tested  PRAXIS: Not tested  EVALUATION OBSERVATIONS: Patient seen following physical therapy services this afternoon.  He arrived ambulating with a single-point cane.  Patient is accompanied by his wife.  Patient is soft-spoken and noted to refer to his wife for answers throughout session.  He has noted to have limitations in bilateral upper extremity range of motion left greater than right with limited success with overhead reaching and extended time for fine motor tasks with both sides but particularly left upper extremity.   TODAY'S TREATMENT:                                                                                                                               - Self Care completed for handling  jars/containers and cutting food as simulated with putty and utensils.   Reviewed Neldon Mc with improvement from 88.6 %  impairment to 43.2 % impairment and from 10/11 categories as Severe/Unable to perform to only 3/11 categories to Severe difficulty (none as unable) - see image in objective data above. Practiced cutting putting with knife in R hand and stabilizing fork with L hand.  Patient able to make > 10 cuts without dropping fork and is encouraged to work on this at home.  - Therapeutic exercises completed for shoulder ROM including modified shoulder flexion to maximize comfort as seated shoulder flexion is difficulty > 90 degrees due to discomfort and increased sensory changes.  However, patient is able to perform table slides, supine stretches and over the door pulley exercises without sensory changes or discomfort limiting ROM ie) able to lift higher than 90 degrees laying in the bed and using pulley to lift arms up.  Cues to reduce shoulder hike still needed. Patient provided info re: over the door pulley for home use with hook (ie. To stay on a door) with instruction to not overdo exercise, move slow and steady 10-20 reps 2-3x/day working up to 5 mins/activity.  Also instructed to work to get elbows straight, stretch hands up as high as possible, and keep his head up and neck straight as this decreases discomfort/sensory changes.  PATIENT EDUCATION: Education details: shoulder ROM  Person educated: Patient Education method: Explanation, Demonstration, and Verbal cues Education comprehension: verbalized understanding, returned demonstration, verbal cues required, and needs further education  HOME EXERCISE PROGRAM: 10/14/22: Putty Exercises - Access Code: [facility] 10/21/22: Added Table Top slide to HEP - Same Access code 10/23/2022: coordination HEP 10/28/22: Desensitization Program  11/06/2022: dowel HEP  GOALS: Goals reviewed with patient? Yes   SHORT TERM GOALS: Target date:  10/30/22  Patient will improve overhead reaching > 100 degrees for B shoulders to assist with ADLs. Baseline: R 100* L 90* Goal status: IN PROGRESS 11/18/22 - still limited in sitting without assistance but > 90 degrees with pulley or supine in bed, during table and wall slides  2.  Patient will demonstrate at least 30 lbs RUE and 15 lbs LUE grip strength as needed to open containers and carry lightweight objects without dropping them.  Baseline: Right Avg 19.8 lbs Left Avg 7.2 lbs Goal status: IN PROGRESS 11/11/22: Right 29.3, 24.6, 25.3 Left 14.3, 12.1, 12.1 Average: Right: 26.4 lbs Left: 12.8 lbs  3.  Patient will complete nine-hole peg with use of RUE in 35 seconds or less and LUE 75 seconds or less.  Baseline: 9 Hole Peg test: Right: 45.22  sec; Left: 1:40.28  sec Goal status: MET  11/11/22 Right: 32.29 sec Left: 45.96 sec  4.  Patient will demonstrate UE strengthening HEP (putty/theraband etc) with 25% verbal cues or less for proper execution. Baseline: Has various materials from hospital. Goal status: IN PROGRESS  5.  Patient will demonstrate good digital A/AROM HEP to minimize stiffness in hands (esp L) which impacts coordination ie) in hand manipulation of small objects. Baseline: Stiff digital extension with decreased end range and difficulty with managing pegs for 9 hole peg test ie) dropped items etc.  Goal status: IN PROGRESS  LONG TERM GOALS: Target date: 11/27/22  Patient will demonstrate UE strength and coordination to cut various foods using appropriate AE if necessary. Baseline: Unable to cut food/Assisted by family Goal status: IN PROGRESS  2.  Patient will demonstrate UE pinch strength and coordination to tie shoelaces tightly. Baseline: Shoelaces frequently unfasten Goal status: MET  3.  Patient will demonstrate at least 40 lbs RUE and 20  lbs LUE grip strength as needed to open containers and carry lightweight objects.  Baseline: Right Avg 19.8 lbs Left Avg 7.2  lbs Goal status: IN PROGRESS 11/11/22: Right 29.3, 24.6, 25.3 Left 14.3, 12.1, 12.1 Average: Right: 26.4 lbs Left: 12.8 lbs  4.  Patient will complete nine-hole peg with use of RUE in 30 seconds or less and LUE 40 seconds or less.  Baseline: 9 Hole Peg test: Right: 45.22  sec; Left: 1:40.28  sec Goal status: Revised 11/11/22  11/11/22 Right: 32.29 sec Left: 45.96 sec  5.  Patient will be able to complete simple food prep activities with good activity tolerance > 15 minutes and safety (ie. Decreased fall risk and implementation of safety precautions). Baseline: Family does all meal prep Goal status: 11/11/22 Discontinued - wife cooks and prepares food for him to warm in microwave (previously did so also)  6. Patient will demonstrate improvement with quick Dash score (reporting <40 % disability or less) indicating improved functional use of affected extremity.   Baseline: 88.6% (10/11 categories as Severe/Unable to perform.  Goal Status: Revised 11/18/22 Quick Dash 43.6   ASSESSMENT:  CLINICAL IMPRESSION: Patient seen today for OT for BUE dysfunction L>R s/p SCI. Patient demonstrates good improvements in UE ROM with support for shoulders with info provided re: over the door pulley.  T   PERFORMANCE DEFICITS: in functional skills including ADLs, IADLs, coordination, dexterity, proprioception, sensation, tone, ROM, strength, pain, muscle spasms, flexibility, Fine motor control, Gross motor control, mobility, balance, endurance, continence, decreased knowledge of use of DME, and UE functional use, cognitive skills including energy/drive, safety awareness, and thought, and psychosocial skills including coping strategies and routines and behaviors.   IMPAIRMENTS: are limiting patient from ADLs, IADLs, rest and sleep, work, leisure, and social participation.   CO-MORBIDITIES: may have co-morbidities  that affects occupational performance. Patient will benefit from skilled OT to address above  impairments and improve overall function.  REHAB POTENTIAL: Good   PLAN:  OT FREQUENCY: 2x/week  OT DURATION: 8 weeks  PLANNED INTERVENTIONS: self care/ADL training, therapeutic exercise, therapeutic activity, neuromuscular re-education, manual therapy, balance training, functional mobility training, splinting, fluidotherapy, patient/family education, energy conservation, coping strategies training, and DME and/or AE instructions  RECOMMENDED OTHER SERVICES: Patient had outpt PT evaluation today also.  CONSULTED AND AGREED WITH PLAN OF CARE: Patient and family member/caregiver  PLAN FOR NEXT SESSIONs:   11/27/22 - UPOC due  Check on cutting food at home s/p practice with putty with B UE coordination/strength improvements.  Progress Coordination and sensory stimulation activities for HEP Review dowel HEP and shoulder strengthening and ROM and progress from table top slides to wall slides  PRINT and UPDATE theraband HEP for UE strengthening. Pt issued yellow, green and blue therabands for HEP in preparation for D/C at the hospital but images were hand drawn by his report. Exercises located in inpt OT notes included external rotation, diagonal pulls, bicep curls, triceps extension, shoulder horizontal abduction. Pt demonstrated exercises and verbal understood tasks. Outpt OT to review, update and print.   Pt may need folder/binder to keep up with information.    Victorino Sparrow, OT 11/18/2022, 3:44 PM

## 2022-11-18 NOTE — Therapy (Signed)
OUTPATIENT PHYSICAL THERAPY NEURO TREATMENT - RE-CERT   Patient Name: Kenneth Flores MRN: [id number] DOB:02/03/1961, 62 y.o., male Today's Date: 11/18/2022   PCP: Norm Salt, [place] REFERRING PROVIDER: Milinda Antis, PA-C  END OF SESSION:  PT End of Session - 11/18/22 1359     Visit Number 8    Number of Visits 25   17 + 8 at re-cert 3/08   Date for PT Re-Evaluation 01/29/23   pushed out due to delay in scheduling   Authorization Type [employer] ULTRA    PT Start Time 1400    PT Stop Time 1446    PT Time Calculation (min) 46 min    Equipment Utilized During Treatment Gait belt    Activity Tolerance Patient tolerated treatment well    Behavior During Therapy WFL for tasks assessed/performed              Past Medical History:  Diagnosis Date   Hepatitis C    Past Surgical History:  Procedure Laterality Date   ANTERIOR CERVICAL DECOMP/DISCECTOMY FUSION N/A 08/21/2022   Procedure: ANTERIOR CERVICAL DECOMPRESSION/DISCECTOMY FUSION CERVICAL THREE-FOUR, CERVICAL FOUR-FIVE, CERVICAL FIVE-SIX;  Surgeon: Julio Sicks, MD;  Location: [facility] OR;  Service: Neurosurgery;  Laterality: N/A;   PELVIC FRACTURE SURGERY Right    Patient Active Problem List   Diagnosis Date Noted   Neurogenic bowel 10/16/2022   Spasticity 10/16/2022   Abnormality of gait 10/16/2022   Acquired left foot drop 10/16/2022   Chronic pain due to trauma 10/16/2022   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) 09/09/2022   Adjustment disorder with depressed mood 08/31/2022   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) 08/27/2022   Cervical spinal cord injury (HCC) 08/19/2022   Chronic hepatitis C without hepatic coma (HCC) 09/08/2021   Thrombocytopenia (HCC) 09/08/2021    ONSET DATE: 08/20/2022 (surgery date)  REFERRING DIAG: 09/16/2022  THERAPY DIAG:  Muscle weakness (generalized)  Other lack of coordination  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)  Other disturbances of skin  sensation  Rationale for Evaluation and Treatment: Rehabilitation  SUBJECTIVE:                                                                                                                                                                                             SUBJECTIVE STATEMENT: Patient received from OT.  He states he has had no falls or near falls since last visit.  Left shoulder is bothering him and he plans to buy an over-the-door device for shoulder mobility per OT recommendation. Pt accompanied by: significant other-[name] (not present during session)  PERTINENT HISTORY: Chronic Hep  C  Patient had syncopal episode in bathroom at home when urinating on 08/19/2022.  When he fell backwards he hit his head.  On 5/30, he was taken to the OR by Dr. Jordan Likes for C3-C6 anterior cervical discectomy and interbody fusion with anterior plate instrumentation secondary to multilevel cervical degenerative disease with disc space collapse, spondylosis and critical spinal stenosis with severe cord compression and signal change at level C3-4.  PAIN:  Are you having pain? Yes: NPRS scale: 7/10 Pain location: left arm Pain description: vibrations and tingling, shooting pains Aggravating factors: random, overworking the arm Relieving factors: pain medicines  PRECAUTIONS: Cervical and Fall-per Dr. Jordan Likes in-basket 7/24 pt has no restrictions.  WEIGHT BEARING RESTRICTIONS: No  FALLS: Has patient fallen in last 6 months? Yes. Number of falls 1-fall that caused surgical need  LIVING ENVIRONMENT: Lives with: lives with their spouse Lives in: House/apartment Stairs: Yes: Internal: 14 steps; on left going up and half of stairs have bilateral rails Has following equipment at home: Single point cane, Shower bench, and Grab bars  PLOF: Requires assistive device for independence, Needs assistance with ADLs, Needs assistance with homemaking, Needs assistance with gait, and Needs assistance with  transfers  PATIENT GOALS: "To try to get better"  OBJECTIVE:   DIAGNOSTIC FINDINGS:  MRI cervical spine 5/29: IMPRESSION: 1. Positive for evidence of both Ligamentous Injury, and Abnormal Cervical Spinal Cord in the setting of trauma: - Prevertebral edema or fluid from C1 through C6. Consider anterior longitudinal ligament injury, although no discrete ligamentous disruption is identified. - mild posterior interspinous ligamentous injury at C3-C4 eccentric to the right. - Abnormal spinal cord signal from C3 to the C5 cord level. While some of this most resembles chronic Myelomalacia (see #2), a component of Acute Cord Contusion cannot be excluded - especially in the left central cord at C3.   *Underlying bulky cervical spine degeneration with spinal stenosis AND spinal cord mass effect from C3-C4 (Moderate) through C5-C6 (mild). Associated moderate to severe degenerative neural foraminal stenosis at the bilateral C4, C5, and left C6 nerve levels.   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  COGNITION: Overall cognitive status:  Wife answers for pt primarily, pt appears to have delayed processing and requires repetition of questions.   SENSATION: Light touch: WFL and pt reporting tingling all the way down the left leg  COORDINATION: LE RAMS:  slow and deliberate BLE heel-to-shin:  limited due to functional weakness  EDEMA:  None noted in BLE, pt and wife deny swelling at home, he is wearing bilateral compression stockings for DVT prevention per wife-she states she will ask MD if these are necessary any longer.  MUSCLE TONE: Questionable left clonus  POSTURE: forward head-wearing soft cervical collar  LOWER EXTREMITY ROM/MMT:     Active  Right Eval Left Eval  Hip flexion 3+/5 2+/5  Hip extension    Hip abduction    Hip adduction    Hip internal rotation    Hip external rotation    Knee flexion    Knee extension 4-/5  3/5  Ankle dorsiflexion 4/5 3/5  Ankle plantarflexion    Ankle inversion    Ankle eversion     (Blank rows = not tested)  BED MOBILITY:  Sit to supine Complete Independence Supine to sit Complete Independence Rolling to Right Complete Independence Rolling to Left Complete Independence *Pain with rolling  TRANSFERS: Assistive device utilized: Single point cane  Sit to stand: SBA  Stand to sit: SBA Chair to chair: SBA  GAIT: Gait pattern: step through pattern, decreased arm swing- Left, decreased step length- Left, decreased stride length, and narrow BOS Distance walked: various clinic distances Assistive device utilized: Single point cane Level of assistance: SBA  FUNCTIONAL TESTS:  5 times sit to stand: 27.91 seconds w/ BUE support 10 meter walk test: 14.82 seconds w/ SPC = 0.67 m/sec OR 2.23 ft/sec Berg Balance Scale: To be assessed.  PATIENT SURVEYS:  None completed due to time.  TODAY'S TREATMENT:                                                                                                                              DATE: 11/18/2022 -Verbally reviewed HEP and patient has current copy.  He states he does not do everything everyday, but he tries to do something everyday.   -Patient requests to warmup on the SciFit as it makes him feel like he has "taken a good walk".  SciFit in ramp up mode to level 5.0 using BUE/BLE for reciprocal mobility and dynamic warmup prior to goal assessment. -5xSTS w/ bilateral hands on knees:  15.91 sec - w/ rollator:  11.31 sec = 0.88 m/sec OR 2.92 ft/sec -BERG:  [facility] PT Assessment - 11/18/22 1427       Berg Balance Test   Sit to Stand Able to stand without using hands and stabilize independently    Standing Unsupported Able to stand safely 2 minutes    Sitting with Back Unsupported but Feet Supported on Floor or Stool Able to sit safely and securely 2 minutes    Stand to Sit Sits safely with minimal use of hands    Transfers Able  to transfer safely, minor use of hands    Standing Unsupported with Eyes Closed Able to stand 10 seconds safely    Standing Unsupported with Feet Together Able to place feet together independently and stand 1 minute safely    From Standing, Reach Forward with Outstretched Arm Can reach confidently >25 cm (10")   13.5"   From Standing Position, Pick up Object from Floor Able to pick up shoe, needs supervision    From Standing Position, Turn to Look Behind Over each Shoulder Looks behind one side only/other side shows less weight shift   less head rotation left   Turn 360 Degrees Able to turn 360 degrees safely one side only in 4 seconds or less    Standing Unsupported, Alternately Place Feet on Step/Stool Able to complete 4 steps without aid or supervision   can complete 8 slowly, but fatigue noted in LLE   Standing Unsupported, One Foot in Front Able to plae foot ahead of the other independently and hold 30 seconds    Standing on One Leg Able to lift leg independently and hold equal to or more than 3 seconds    Total Score 48    Berg comment: 48/56 = moderate fall risk, transitioning  to use of cane for indoor and outdoor surfaces            PATIENT EDUCATION: Education details: Progress towards goals and emphasis on working on standing w/o UE support to focus on power production in the legs.  Discussed scheduling more PT visits for re-cert. Person educated: Patient Education method: Explanation Education comprehension: verbalized understanding and needs further education  HOME EXERCISE PROGRAM: Access Code: [email or web address] URL: [email or web address] Date: 10/09/2022 Prepared by: Camille Bal  Exercises - Side Stepping with Resistance at Thighs and Counter Support  - 1 x daily - 5 x weekly - 3 sets - 10 reps - Standing Tandem Balance with Counter Support  - 1 x daily - 5 x weekly - 1 sets - 4 reps - 30 seconds hold - Sit to Stand with Resistance Around Legs  - 1 x daily -  5 x weekly - 1-2 sets - 10 reps - Heel Raises with Counter Support  - 1 x daily - 5 x weekly - 2 sets - 12 reps - Supine Chin Tuck  - 1 x daily - 5 x weekly - 3 sets - 10 reps  -Cervical AROM in all planes as tolerated  You Can Walk For A Certain Length Of Time Each Day (use rollator for safety and have wife nearby for supervision)                          Walk 4-5 minutes 2-3 times per day.             Increase 2-3  minutes every week.             Work up to 20 minutes continuously once per day.               Example:                         Day 1-2           4-5 minutes     3 times per day                         Day 7-8           10-12 minutes 2-3 times per day                         Day 13-14       20-22 minutes 1-2 times per day  GOALS: Goals reviewed with patient? Yes  SHORT TERM GOALS: Target date: 10/23/2022  Pt will be independent and compliant with introductory strength and balance HEP in order to maintain functional progress and improve mobility. Baseline:  Patient compliant (8/7) Goal status: MET  2.  Pt will decrease 5xSTS to </=22.91 seconds in order to demonstrate decreased risk for falls and improved functional bilateral LE strength and power. Baseline: 27.91 seconds w/ BUE support; 24.41 seconds w/ BUE support (8/7) Goal status: IN PROGRESS  3.  Pt will demonstrate a gait speed of >/=0.8 m/sec in order to decrease risk for falls. Baseline: 0.67 m/sec; 0.63 m/sec OR 2.06 ft/sec w/ rollator (8/7) Goal status: NOT MET  4.  Pt will increase BERG balance score to >/=40/56 to demonstrate improved static balance. Baseline: 36/56 (7/19); 42/56 (8/7) Goal status: MET  LONG TERM GOALS: Target date: 11/20/2022  Pt will be independent and compliant with advanced  strength and balance HEP in order to maintain functional progress and improve mobility. Baseline:  IND and compliant; could use updates (8/28) Goal status: IN PROGRESS  2.  Pt will decrease 5xSTS to </=17.91  seconds in order to demonstrate decreased risk for falls and improved functional bilateral LE strength and power. Baseline: 27.91 seconds w/ BUE support; 15.91 sec w/ bilateral hands on knees (8/28) Goal status: MET  3.  Pt will demonstrate a gait speed of >/=0.93 m/sec in order to decrease risk for falls. Baseline: 0.67 m/sec; 0.88 m/sec w/ rollator (8/28) Goal status: PARTIALLY MET  4.  Pt will increase BERG balance score to >/=44/56 to demonstrate improved static balance. Baseline: 36/56 (7/19); 48/56 (8/28) Goal status: MET  5.  Pt will ambulate >/=500 feet independently over unlevel surface to promote household and community access. Baseline: inside clinic level distance w/ SPC SBA Goal status: INITIAL  6.  Patient will report average 7 day pain of </=5/10 at rest in order to demonstrate improved quality of life. Baseline: 7/10 shoulders and left arm at rest; 7/10 left UE recent week average (8/28) Goal status: NOT MET  NEW GOALS: Goals reviewed with patient? Yes  SHORT TERM GOALS: Target date: 12/18/2022  Pt will be independent and compliant with advanced strength and balance HEP in order to maintain functional progress and improve mobility. Baseline:  Needs updates (8/28) Goal status:  INITIAL  2.  Pt will decrease 5xSTS to </=13 seconds w/o UE support in order to demonstrate decreased risk for falls and improved functional bilateral LE strength and power. Baseline: 15.91 sec w/ bilateral hands on knees (8/28) Goal status: INITIAL  3.  Pt will demonstrate a gait speed of >/=0.98 m/sec in order to decrease risk for falls using rollator. Baseline: 0.88 m/sec w/ rollator (8/28) Goal status: INITIAL  LONG TERM GOALS: Target date: 01/15/2023  1.  Pt will demonstrate a gait speed of >/=1.08 m/sec in order to decrease risk for falls using no more than a cane. Baseline: 0.88 m/sec w/ rollator (8/28) Goal status: INITIAL  2.  Pt will increase BERG balance score to >/=52/56 to  demonstrate improved static balance. Baseline: 48/56 (8/28) Goal status: REVISED  3.  Pt will ambulate >/=800 feet at no more than modI level of assist over unlevel surfaces and grass to promote household and community access. Baseline: inside clinic level distance w/ SPC SBA Goal status: INITIAL  4.  Patient will report average 7 day pain of </=5/10 at rest in order to demonstrate improved quality of life. Baseline: 7/10 left UE recent week average (8/28) Goal status: ONGOING  ASSESSMENT:  CLINICAL IMPRESSION: Assessed long term goals today in preparation for re-cert with patient making progress towards 4 of 6 goals.  Goal 5 not assessed today due to time and patient remains limited by consistent LUE neuropathic pain.  He is ambulating at a speed of 0.88 m/sec which was just shy of goal level and a significant improvement from baseline.  He improved his BERG score to 48/56 which makes him appropriate to progress to a cane over variable surfaces.  He demonstrates significantly improved LE strength with 5xSTS completed in 15.91 seconds, but still relies on hand support.  Will plan to update his HEP at next session.  OBJECTIVE IMPAIRMENTS: Abnormal gait, decreased activity tolerance, decreased balance, decreased cognition, decreased coordination, decreased endurance, decreased knowledge of condition, difficulty walking, decreased ROM, decreased strength, impaired sensation, impaired tone, improper body mechanics, postural dysfunction, and pain.   ACTIVITY  LIMITATIONS: carrying, lifting, bending, standing, squatting, stairs, transfers, bed mobility, bathing, reach over head, and locomotion level  PARTICIPATION LIMITATIONS: meal prep, cleaning, laundry, medication management, driving, shopping, community activity, occupation, and yard work  PERSONAL FACTORS: Age, Fitness, Past/current experiences, Transportation, and 1 comorbidity: chronic Hep C  are also affecting patient's functional outcome.    REHAB POTENTIAL: Good  CLINICAL DECISION MAKING: Evolving/moderate complexity  EVALUATION COMPLEXITY: Moderate  PLAN:  PT FREQUENCY: 1-2x/week + 1x/wk (re-cert 5/62)  PT DURATION: 8 weeks + 8 weeks (re-cert 1/30)  PLANNED INTERVENTIONS: Therapeutic exercises, Therapeutic activity, Neuromuscular re-education, Balance training, Gait training, Patient/Family education, Self Care, Stair training, Vestibular training, Orthotic/Fit training, DME instructions, scar mobilization, Manual therapy, and Re-evaluation  PLAN FOR NEXT SESSION: UPDATE old LTG 5 baseline!  UPDATE strength and balance HEP.   Gait training w/ vs w/o SPC vs rollator for long/unlevel.  Endurance.  LLE NMR-leg press (try single), hurdles, unsupported static balance, periscapular strength, IASTM for left shoulder again  Sadie Haber, PT, DPT 11/18/2022, 5:58 PM

## 2022-11-20 ENCOUNTER — Ambulatory Visit: Payer: Self-pay | Admitting: Occupational Therapy

## 2022-11-25 ENCOUNTER — Encounter: Payer: Self-pay | Admitting: Physical Therapy

## 2022-11-25 ENCOUNTER — Ambulatory Visit: Payer: No Typology Code available for payment source | Admitting: Physical Therapy

## 2022-11-25 ENCOUNTER — Ambulatory Visit: Payer: No Typology Code available for payment source | Attending: Physician Assistant | Admitting: Occupational Therapy

## 2022-11-25 DIAGNOSIS — M79602 Pain in left arm: Secondary | ICD-10-CM | POA: Diagnosis present

## 2022-11-25 DIAGNOSIS — M62838 Other muscle spasm: Secondary | ICD-10-CM | POA: Diagnosis present

## 2022-11-25 DIAGNOSIS — R2681 Unsteadiness on feet: Secondary | ICD-10-CM

## 2022-11-25 DIAGNOSIS — R2689 Other abnormalities of gait and mobility: Secondary | ICD-10-CM | POA: Diagnosis present

## 2022-11-25 DIAGNOSIS — M6281 Muscle weakness (generalized): Secondary | ICD-10-CM

## 2022-11-25 DIAGNOSIS — R208 Other disturbances of skin sensation: Secondary | ICD-10-CM | POA: Diagnosis present

## 2022-11-25 DIAGNOSIS — R278 Other lack of coordination: Secondary | ICD-10-CM | POA: Diagnosis present

## 2022-11-25 DIAGNOSIS — R29898 Other symptoms and signs involving the musculoskeletal system: Secondary | ICD-10-CM | POA: Insufficient documentation

## 2022-11-25 NOTE — Patient Instructions (Signed)
Access Code: [email or web address] URL: [email or web address] Date: 11/25/2022 Prepared by: Camille Bal  Exercises - Side Stepping with Resistance at Thighs and Counter Support  - 1 x daily - 5 x weekly - 3 sets - 10 reps - Standing Tandem Balance with Counter Support  - 1 x daily - 5 x weekly - 1 sets - 4 reps - 30 seconds hold - Sit to Stand with Resistance Around Legs  - 1 x daily - 5 x weekly - 1-2 sets - 10 reps - Heel Raises with Counter Support  - 1 x daily - 5 x weekly - 2 sets - 12 reps - Supine Chin Tuck  - 1 x daily - 5 x weekly - 3 sets - 10 reps - Forward Backward Monster Walk with Band at Thighs and Counter Support  - 1 x daily - 5 x weekly - 3 sets - 10 reps - Supine Cervical Retraction with Towel  - 1 x daily - 5 x weekly - 1-2 sets - 10 reps - Supine Bridge  - 1 x daily - 5 x weekly - 1 sets - 15 reps - Scapular retraction with resistance  - 1 x daily - 5 x weekly - 1-2 sets - 15 reps

## 2022-11-25 NOTE — Therapy (Signed)
OUTPATIENT PHYSICAL THERAPY NEURO TREATMENT   Patient Name: Kenneth Flores MRN: [id number] DOB:Feb 26, 1961, 62 y.o., male Today's Date: 11/25/2022   PCP: Norm Salt, [place] REFERRING PROVIDER: Milinda Antis, PA-C  END OF SESSION:  PT End of Session - 11/25/22 1319     Visit Number 9    Number of Visits 25   17 + 8 at re-cert 9/14   Date for PT Re-Evaluation 01/29/23   pushed out due to delay in scheduling   Authorization Type [employer] ULTRA    PT Start Time 1320    PT Stop Time 1359    PT Time Calculation (min) 39 min    Equipment Utilized During Treatment Gait belt    Activity Tolerance Patient tolerated treatment well    Behavior During Therapy WFL for tasks assessed/performed              Past Medical History:  Diagnosis Date   Hepatitis C    Past Surgical History:  Procedure Laterality Date   ANTERIOR CERVICAL DECOMP/DISCECTOMY FUSION N/A 08/21/2022   Procedure: ANTERIOR CERVICAL DECOMPRESSION/DISCECTOMY FUSION CERVICAL THREE-FOUR, CERVICAL FOUR-FIVE, CERVICAL FIVE-SIX;  Surgeon: Julio Sicks, MD;  Location: [facility] OR;  Service: Neurosurgery;  Laterality: N/A;   PELVIC FRACTURE SURGERY Right    Patient Active Problem List   Diagnosis Date Noted   Neurogenic bowel 10/16/2022   Spasticity 10/16/2022   Abnormality of gait 10/16/2022   Acquired left foot drop 10/16/2022   Chronic pain due to trauma 10/16/2022   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) 09/09/2022   Adjustment disorder with depressed mood 08/31/2022   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) 08/27/2022   Cervical spinal cord injury (HCC) 08/19/2022   Chronic hepatitis C without hepatic coma (HCC) 09/08/2021   Thrombocytopenia (HCC) 09/08/2021    ONSET DATE: 08/20/2022 (surgery date)  REFERRING DIAG: 09/16/2022  THERAPY DIAG:  Muscle weakness (generalized)  Other lack of coordination  Pain in left arm  Other symptoms and signs involving the musculoskeletal system  Other  disturbances of skin sensation  Unsteadiness on feet  Other abnormalities of gait and mobility  Other muscle spasm  Rationale for Evaluation and Treatment: Rehabilitation  SUBJECTIVE:                                                                                                                                                                                             SUBJECTIVE STATEMENT: Patient received from OT ambulating with SPC.  He states he has had no falls or near falls since last visit and switching to the cane from rollator.  Upper back is bothering  him more today. Pt accompanied by: significant other-[name] (not present during session)  PERTINENT HISTORY: Chronic Hep C  Patient had syncopal episode in bathroom at home when urinating on 08/19/2022.  When he fell backwards he hit his head.  On 5/30, he was taken to the OR by Dr. Jordan Likes for C3-C6 anterior cervical discectomy and interbody fusion with anterior plate instrumentation secondary to multilevel cervical degenerative disease with disc space collapse, spondylosis and critical spinal stenosis with severe cord compression and signal change at level C3-4.  PAIN:  Are you having pain? Yes: NPRS scale: 7/10 Pain location: left arm Pain description: vibrations and tingling, shooting pains Aggravating factors: random, overworking the arm Relieving factors: pain medicines  PRECAUTIONS: Cervical and Fall-per Dr. Jordan Likes in-basket 7/24 pt has no restrictions.  WEIGHT BEARING RESTRICTIONS: No  FALLS: Has patient fallen in last 6 months? Yes. Number of falls 1-fall that caused surgical need  LIVING ENVIRONMENT: Lives with: lives with their spouse Lives in: House/apartment Stairs: Yes: Internal: 14 steps; on left going up and half of stairs have bilateral rails Has following equipment at home: Single point cane, Shower bench, and Grab bars  PLOF: Requires assistive device for independence, Needs assistance with ADLs, Needs  assistance with homemaking, Needs assistance with gait, and Needs assistance with transfers  PATIENT GOALS: "To try to get better"  OBJECTIVE:   DIAGNOSTIC FINDINGS:  MRI cervical spine 5/29: IMPRESSION: 1. Positive for evidence of both Ligamentous Injury, and Abnormal Cervical Spinal Cord in the setting of trauma: - Prevertebral edema or fluid from C1 through C6. Consider anterior longitudinal ligament injury, although no discrete ligamentous disruption is identified. - mild posterior interspinous ligamentous injury at C3-C4 eccentric to the right. - Abnormal spinal cord signal from C3 to the C5 cord level. While some of this most resembles chronic Myelomalacia (see #2), a component of Acute Cord Contusion cannot be excluded - especially in the left central cord at C3.   *Underlying bulky cervical spine degeneration with spinal stenosis AND spinal cord mass effect from C3-C4 (Moderate) through C5-C6 (mild). Associated moderate to severe degenerative neural foraminal stenosis at the bilateral C4, C5, and left C6 nerve levels.   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  COGNITION: Overall cognitive status:  Wife answers for pt primarily, pt appears to have delayed processing and requires repetition of questions.   SENSATION: Light touch: WFL and pt reporting tingling all the way down the left leg  COORDINATION: LE RAMS:  slow and deliberate BLE heel-to-shin:  limited due to functional weakness  EDEMA:  None noted in BLE, pt and wife deny swelling at home, he is wearing bilateral compression stockings for DVT prevention per wife-she states she will ask MD if these are necessary any longer.  MUSCLE TONE: Questionable left clonus  POSTURE: forward head-wearing soft cervical collar  LOWER EXTREMITY ROM/MMT:     Active  Right Eval Left Eval  Hip flexion 3+/5 2+/5  Hip extension    Hip abduction    Hip adduction    Hip  internal rotation    Hip external rotation    Knee flexion    Knee extension 4-/5 3/5  Ankle dorsiflexion 4/5 3/5  Ankle plantarflexion    Ankle inversion    Ankle eversion     (Blank rows = not tested)  BED MOBILITY:  Sit to supine Complete Independence Supine to sit Complete Independence Rolling to Right Complete Independence Rolling to Left  Complete Independence *Pain with rolling  TRANSFERS: Assistive device utilized: Single point cane  Sit to stand: SBA Stand to sit: SBA Chair to chair: SBA  GAIT: Gait pattern: step through pattern, decreased arm swing- Left, decreased step length- Left, decreased stride length, and narrow BOS Distance walked: various clinic distances Assistive device utilized: Single point cane Level of assistance: SBA  FUNCTIONAL TESTS:  5 times sit to stand: 27.91 seconds w/ BUE support 10 meter walk test: 14.82 seconds w/ SPC = 0.67 m/sec OR 2.23 ft/sec Berg Balance Scale: To be assessed.  PATIENT SURVEYS:  None completed due to time.  TODAY'S TREATMENT:                                                                                                                              DATE: 11/25/2022 -Pt ambulates 500 ft w/ SPC SBA unlevel sidewalk  Reviewed and modified HEP: Access Code: [email or web address] URL: [email or web address] Date: 11/25/2022 Prepared by: Camille Bal  Exercises - Side Stepping with Resistance at Thighs and Counter Support  - 1 x daily - 5 x weekly - 3 sets - 10 reps - Standing Tandem Balance with Counter Support  - 1 x daily - 5 x weekly - 1 sets - 4 reps - 30 seconds hold - gradually progress away from UE support - Sit to Stand with Resistance Around Legs  - 1 x daily - 5 x weekly - 1-2 sets - 10 reps - use green theraband vs red as previously using for this and lateral stepping - Heel Raises with Counter Support  - 1 x daily - 5 x weekly - 2 sets - 12 reps - Supine Chin Tuck  - 1 x daily - 5 x weekly - 3 sets -  10 reps - Forward Backward Monster Walk with Band at [employer] and Counter Support  - 1 x daily - 5 x weekly - 3 sets - 10 reps - Supine Cervical Retraction with Towel  - 1 x daily - 5 x weekly - 1-2 sets - 10 reps - Supine Bridge  - 1 x daily - 5 x weekly - 1 sets - 15 reps - pt denies neck discomfort, limited lift ROM; attempted single leg bridge w/ pt unable to fully clear bottom - Scapular retraction with resistance  - 1 x daily - 5 x weekly - 1-2 sets - 15 reps  PATIENT EDUCATION: Education details:  HEP modifications - focus on things that remain challenging and break tasks up as needed.  Use rollator as needed for safety - possibly for long, unfamiliar, or unlevel terrain. Person educated: Patient Education method: Explanation Education comprehension: verbalized understanding and needs further education  HOME EXERCISE PROGRAM: Access Code: [email or web address] URL: [email or web address] Date: 11/25/2022 Prepared by: Camille Bal  Exercises - Side Stepping with Resistance at Thighs and Counter Support  - 1 x daily - 5 x weekly - 3 sets - 10 reps - Standing Tandem Balance with  Counter Support  - 1 x daily - 5 x weekly - 1 sets - 4 reps - 30 seconds hold - Sit to Stand with Resistance Around Legs  - 1 x daily - 5 x weekly - 1-2 sets - 10 reps - Heel Raises with Counter Support  - 1 x daily - 5 x weekly - 2 sets - 12 reps - Supine Chin Tuck  - 1 x daily - 5 x weekly - 3 sets - 10 reps - Forward Backward Monster Walk with Band at Thighs and Counter Support  - 1 x daily - 5 x weekly - 3 sets - 10 reps - Supine Cervical Retraction with Towel  - 1 x daily - 5 x weekly - 1-2 sets - 10 reps - Supine Bridge  - 1 x daily - 5 x weekly - 1 sets - 15 reps - Scapular retraction with resistance  - 1 x daily - 5 x weekly - 1-2 sets - 15 reps -Cervical AROM in all planes as tolerated  You Can Walk For A Certain Length Of Time Each Day (use rollator for safety and have wife nearby for  supervision)                          Walk 4-5 minutes 2-3 times per day.             Increase 2-3  minutes every week.             Work up to 20 minutes continuously once per day.               Example:                         Day 1-2           4-5 minutes     3 times per day                         Day 7-8           10-12 minutes 2-3 times per day                         Day 13-14       20-22 minutes 1-2 times per day  GOALS: Goals reviewed with patient? Yes  SHORT TERM GOALS: Target date: 10/23/2022  Pt will be independent and compliant with introductory strength and balance HEP in order to maintain functional progress and improve mobility. Baseline:  Patient compliant (8/7) Goal status: MET  2.  Pt will decrease 5xSTS to </=22.91 seconds in order to demonstrate decreased risk for falls and improved functional bilateral LE strength and power. Baseline: 27.91 seconds w/ BUE support; 24.41 seconds w/ BUE support (8/7) Goal status: IN PROGRESS  3.  Pt will demonstrate a gait speed of >/=0.8 m/sec in order to decrease risk for falls. Baseline: 0.67 m/sec; 0.63 m/sec OR 2.06 ft/sec w/ rollator (8/7) Goal status: NOT MET  4.  Pt will increase BERG balance score to >/=40/56 to demonstrate improved static balance. Baseline: 36/56 (7/19); 42/56 (8/7) Goal status: MET  LONG TERM GOALS: Target date: 11/20/2022  Pt will be independent and compliant with advanced strength and balance HEP in order to maintain functional progress and improve mobility. Baseline:  IND and compliant; could use updates (8/28) Goal status: IN PROGRESS  2.  Pt will decrease 5xSTS to </=17.91 seconds in order to demonstrate decreased risk for falls and improved functional bilateral LE strength and power. Baseline: 27.91 seconds w/ BUE support; 15.91 sec w/ bilateral hands on knees (8/28) Goal status: MET  3.  Pt will demonstrate a gait speed of >/=0.93 m/sec in order to decrease risk for falls. Baseline: 0.67  m/sec; 0.88 m/sec w/ rollator (8/28) Goal status: PARTIALLY MET  4.  Pt will increase BERG balance score to >/=44/56 to demonstrate improved static balance. Baseline: 36/56 (7/19); 48/56 (8/28) Goal status: MET  5.  Pt will ambulate >/=500 feet independently over unlevel surface to promote household and community access. Baseline: inside clinic level distance w/ SPC SBA ; 500 ft w/ SPC SBA unlevel sidewalk (9/4) Goal status: IN PROGRESS  6.  Patient will report average 7 day pain of </=5/10 at rest in order to demonstrate improved quality of life. Baseline: 7/10 shoulders and left arm at rest; 7/10 left UE recent week average (8/28) Goal status: NOT MET  NEW GOALS: Goals reviewed with patient? Yes  SHORT TERM GOALS: Target date: 12/18/2022  Pt will be independent and compliant with advanced strength and balance HEP in order to maintain functional progress and improve mobility. Baseline:  Needs updates (8/28) Goal status:  INITIAL  2.  Pt will decrease 5xSTS to </=13 seconds w/o UE support in order to demonstrate decreased risk for falls and improved functional bilateral LE strength and power. Baseline: 15.91 sec w/ bilateral hands on knees (8/28) Goal status: INITIAL  3.  Pt will demonstrate a gait speed of >/=0.98 m/sec in order to decrease risk for falls using rollator. Baseline: 0.88 m/sec w/ rollator (8/28) Goal status: INITIAL  LONG TERM GOALS: Target date: 01/15/2023  1.  Pt will demonstrate a gait speed of >/=1.08 m/sec in order to decrease risk for falls using no more than a cane. Baseline: 0.88 m/sec w/ rollator (8/28) Goal status: INITIAL  2.  Pt will increase BERG balance score to >/=52/56 to demonstrate improved static balance. Baseline: 48/56 (8/28) Goal status: REVISED  3.  Pt will ambulate >/=800 feet at no more than modI level of assist over unlevel surfaces and grass to promote household and community access. Baseline: inside clinic level distance w/ SPC  SBA; 500 ft w/ SPC SBA unlevel sidewalk (9/4) Goal status: INITIAL  4.  Patient will report average 7 day pain of </=5/10 at rest in order to demonstrate improved quality of life. Baseline: 7/10 left UE recent week average (8/28) Goal status: ONGOING  ASSESSMENT:  CLINICAL IMPRESSION: Majority of skilled PT session today spent reviewing and advancing HEP to progress focus on lingering deficits.  He remains challenged by hip weakness impacting gait pattern and stability.  His posture is creating some stiffness and discomfort in the posterior chain that OT and PT continue to address.  He continues to benefit from ongoing PT services to address these and other deficits involving balance, gait, and general safety with upright mobility.  OBJECTIVE IMPAIRMENTS: Abnormal gait, decreased activity tolerance, decreased balance, decreased cognition, decreased coordination, decreased endurance, decreased knowledge of condition, difficulty walking, decreased ROM, decreased strength, impaired sensation, impaired tone, improper body mechanics, postural dysfunction, and pain.   ACTIVITY LIMITATIONS: carrying, lifting, bending, standing, squatting, stairs, transfers, bed mobility, bathing, reach over head, and locomotion level  PARTICIPATION LIMITATIONS: meal prep, cleaning, laundry, medication management, driving, shopping, community activity, occupation, and yard work  PERSONAL FACTORS: Age, Fitness, Past/current experiences, Transportation, and 1 comorbidity: chronic  Hep C  are also affecting patient's functional outcome.   REHAB POTENTIAL: Good  CLINICAL DECISION MAKING: Evolving/moderate complexity  EVALUATION COMPLEXITY: Moderate  PLAN:  PT FREQUENCY: 1-2x/week + 1x/wk (re-cert 4/33)  PT DURATION: 8 weeks + 8 weeks (re-cert 2/95)  PLANNED INTERVENTIONS: Therapeutic exercises, Therapeutic activity, Neuromuscular re-education, Balance training, Gait training, Patient/Family education, Self Care,  Stair training, Vestibular training, Orthotic/Fit training, DME instructions, scar mobilization, Manual therapy, and Re-evaluation  PLAN FOR NEXT SESSION: DELETE OLD GOALS.  How is updated HEP Gait training w/ vs w/o SPC vs rollator for long/unlevel.  Endurance.  LLE NMR-leg press (try single), hurdles, unsupported static balance, periscapular strength, IASTM for left shoulder again  Sadie Haber, PT, DPT 11/25/2022, 2:03 PM

## 2022-11-25 NOTE — Therapy (Unsigned)
OUTPATIENT OCCUPATIONAL THERAPY NEURO TREATMENT & PROGRESS NOTE  Patient Name: Kenneth Flores MRN: [id number] DOB:1960-04-14, 62 y.o., male Today's Date: 11/25/2022  PCP: Norm Salt, [place] REFERRING PROVIDER: Milinda Antis, PA-C  END OF SESSION:  OT End of Session - 11/25/22 1225     Visit Number 11    Number of Visits 16   + Evaluation   Date for OT Re-Evaluation 11/27/22    Authorization Type [employer] 2024 VL: 60 (PT/OT/ST)    OT Start Time 1230    OT Stop Time 1315    OT Time Calculation (min) 45 min    Equipment Utilized During Treatment therapy mat    Activity Tolerance Patient tolerated treatment well    Behavior During Therapy WFL for tasks assessed/performed             Past Medical History:  Diagnosis Date   Hepatitis C    Past Surgical History:  Procedure Laterality Date   ANTERIOR CERVICAL DECOMP/DISCECTOMY FUSION N/A 08/21/2022   Procedure: ANTERIOR CERVICAL DECOMPRESSION/DISCECTOMY FUSION CERVICAL THREE-FOUR, CERVICAL FOUR-FIVE, CERVICAL FIVE-SIX;  Surgeon: Julio Sicks, MD;  Location: [facility] OR;  Service: Neurosurgery;  Laterality: N/A;   PELVIC FRACTURE SURGERY Right    Patient Active Problem List   Diagnosis Date Noted   Neurogenic bowel 10/16/2022   Spasticity 10/16/2022   Abnormality of gait 10/16/2022   Acquired left foot drop 10/16/2022   Chronic pain due to trauma 10/16/2022   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) 09/09/2022   Adjustment disorder with depressed mood 08/31/2022   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   Pt accompanied by: self   PERTINENT HISTORY:   Hx of chronic Hep C, status post micturition related syncope from vasovagal episode at home on 08/19/2022 resulting in fall with blow to head. Imaging revealed evidence of significant multilevel cervical degenerative disease with associated disc space collapse and associated spondylosis. Patient with critical spinal stenosis with severe cord compression and cord signal change at C3-4. Severe stenosis at C4-5 and C5-6 without marked cord signal abnormality at these levels. There is some changes posteriorly from C3-C6 with some worrisome findings for some posterior ligamentous injury. There was no evidence of fracture on his CT scan. There is no evidence of malalignment. Neurosurgery consulted and admitted the patient for IV steroids and pain control. On 5/30, he was taken to the OR and underwent C3-4, C4-5, C5-6 anterior cervical discectomy with interbody fusion utilizing interbody cages, local harvested autograft, and anterior plate instrumentation. Motor function improved.   In patient Rehab 08/27/2022 - 09/16/2022   Discharge Diagnoses:  Principal Problem:   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) Active Problems:   Adjustment disorder with depressed mood Active problems: Functional deficits secondary to incomplete spinal cord lesion at C5-C7 level Constipation History of chronic hepatitis C Neurogenic bladder with urinary retention Neurogenic bowel Hypertension  PRECAUTIONS: Cervical and Other: soft collar when moving/walking but takes it off at rest and when sleeping  WEIGHT BEARING RESTRICTIONS: No  PAIN:  Are you having pain? Yes: NPRS scale: 7/10 Pain location: L arm, shoulder and across shoulder and lower back today Pain description: nerve pain in shoulders/arm but aching in  back Aggravating factors: Overworking it, doing too much Relieving factors: rest/relaxing, oral medication 4x/day, heating pad sometimes, BIoFreeze   FALLS: Has patient fallen in last 6 months? Yes. Number of falls Previously 2 related to same vasovagal issues - the first fall without injury was a couple of weeks prior to the second fall when he hit his head on the sink   Additional fall around 10/06/22 when his legs did a split while trying to walk at home  LIVING ENVIRONMENT: Lives with: lives with their spouse - [name] Lives in: House/apartment Stairs: Yes: Internal: 14 steps; on right going up, on left going up, and only half way up on the R side; wife walks with him upstairs to the bedrooms Has following equipment at home: Single point cane, Shower bench, and Grab bars  PLOF: Independent, driving, worked as Administrator (x40 hours/week)  PATIENT GOALS: Get my L arm better, tie shoelaces tighter so they stay tied  OBJECTIVE:   HAND DOMINANCE: Right  ADLs: Overall ADLs: Min to mod assist Transfers/ambulation related to ADLs: SBA with cane Eating: Needs help to cut his food Grooming: He tries to shave himself but gets help for the things he can't reach/get to on his own. UB Dressing: He can dress himself with simple clothes but needs extra time but needs help with fasteners LB Dressing: Needs help to get TED hose on and tie shoelaces Toileting: MI but wife is still monitoring him Bathing: Wife assists with his back and is present during showers Tub Shower transfers: Tub transfer bench - wife is there for extra support due to him being wobbly and has been trying to step into the tub with grab bars rather than sitting on the bench all the time Equipment: Transfer tub bench and Grab bars  IADLs: Shopping: Wife is performing shopping. Light housekeeping: Dependent on family ie) Not able to safely or effectively help take out the trash, mow the lawn etc. Meal Prep: Wife performs this.  He  did help with this a little previously. Community mobility: Uses cane with supervision Medication management: No meds previously but now 4x/day.  Using pill box and patient did sort them himself the last time but his wife is checking to make sure they are accurate and calls him to remind him to take his medication as needed (ie if she is working Catering manager). Financial management: Wife is helping more than she needed to previously Handwriting: 50% legible - Patient reports his current writing is not as good as before (about 50% compared to before) but it has improved significantly from when he had to mark/sign with an "X" only.    Patient is never home alone (if his wife is at work, his stepson will be there).  He stays on the bottom level of the home during the daytime and is off the narcotic in the daytime.  He uses his cane in the house  MOBILITY STATUS: Needs Assist: supervision for safety  POSTURE COMMENTS:  forward head Sitting balance: WFL  ACTIVITY TOLERANCE: Activity tolerance: Decreased.  Patient reports falling asleep/napping daily at this time (combo of medication etc)  FUNCTIONAL OUTCOME MEASURES: Quick Dash: 88.6  (see eval report for complete list of limitations).  11/17/22 Quick Dash: 43.2   UPPER EXTREMITY ROM:  Active ROM Right eval Left eval  Shoulder flexion 100 90  Shoulder abduction 90 75  Shoulder adduction    Shoulder extension Limited end range   Shoulder internal rotation   Shoulder external rotation    Elbow flexion Limited end range  Elbow extension Decreased end range (L side spasmed with stretch  Wrist flexion    Wrist extension    Wrist ulnar deviation    Wrist radial deviation    Wrist pronation    Wrist supination    Digital extension Decreased end range and slow to open   (Blank rows = not formally tested at eval)  UPPER EXTREMITY MMT:     MMT Right eval Left eval  Shoulder flexion 3- 3-  Shoulder abduction    Shoulder adduction     Shoulder extension    Shoulder internal rotation    Shoulder external rotation    Middle trapezius    Lower trapezius    Elbow flexion 3 3-  Elbow extension    Wrist flexion    Wrist extension    Wrist ulnar deviation    Wrist radial deviation    Wrist pronation    Wrist supination    (Blank rows = not formally tested at eval)  HAND FUNCTION: Grip strength: Right: 21.1, 18.2, 20.0 lbs; Left: 8.5, 5.7, 7.4  lbs Evaluation: Right Avg 19.8 lbs Left Avg 7.2 lbs  11/11/22: Right 29.3, 24.6, 25.3 Left 14.3, 12.1, 12.1  COORDINATION: 9 Hole Peg test: Right: 45.22  sec; Left: 1:40.28  sec   11/11/22 Right: 32.29 sec Left: 45.96 sec  Box and Blocks:  Right 41 blocks, Left 31 blocks  11/11/22 Right: 41 blocks, Left: 35 blocks   SENSATION: Not tested  EDEMA: None noted in UEs  MUSCLE TONE: Patient has some stiffness with ROM  COGNITION: Overall cognitive status: Within functional limits for tasks assessed BIMS from hospital (08/2022) 15/15  VISION: Subjective report: No changes since accident Baseline vision: Bifocals Visual history: NA  VISION ASSESSMENT: Not tested  Patient has no changes or difficulty with any activities due to vision.  PERCEPTION: Not tested  PRAXIS: Not tested  EVALUATION OBSERVATIONS: Patient seen following physical therapy services this afternoon.  He arrived ambulating with a single-point cane.  Patient is accompanied by his wife.  Patient is soft-spoken and noted to refer to his wife for answers throughout session.  He has noted to have limitations in bilateral upper extremity range of motion left greater than right with limited success with overhead reaching and extended time for fine motor tasks with both sides but particularly left upper extremity.   TODAY'S TREATMENT:                                                                                                                               - Self Care completed for handling  jars/containers and cutting food as simulated with putty and utensils.   Reviewed Neldon Mc  with improvement from 88.6 % impairment to 43.2 % impairment and from 10/11 categories as Severe/Unable to perform to only 3/11 categories to Severe difficulty (none as unable) - see image in objective data above. Practiced cutting putting with knife in R hand and stabilizing fork with L hand.  Patient able to make > 10 cuts without dropping fork and is encouraged to work on this at home.  - Therapeutic exercises completed for shoulder ROM including modified shoulder flexion to maximize comfort as seated shoulder flexion is difficulty > 90 degrees due to discomfort and increased sensory changes.  However, patient is able to perform table slides, supine stretches and over the door pulley exercises without sensory changes or discomfort limiting ROM ie) able to lift higher than 90 degrees laying in the bed and using pulley to lift arms up.  Cues to reduce shoulder hike still needed. Patient provided info re: over the door pulley for home use with hook (ie. To stay on a door) with instruction to not overdo exercise, move slow and steady 10-20 reps 2-3x/day working up to 5 mins/activity.  Also instructed to work to get elbows straight, stretch hands up as high as possible, and keep his head up and neck straight as this decreases discomfort/sensory changes.  PATIENT EDUCATION: Education details: shoulder ROM  Person educated: Patient Education method: Explanation, Demonstration, and Verbal cues Education comprehension: verbalized understanding, returned demonstration, verbal cues required, and needs further education  HOME EXERCISE PROGRAM: 10/14/22: Putty Exercises - Access Code: [facility] 10/21/22: Added Table Top slide to HEP - Same Access code 10/23/2022: coordination HEP 10/28/22: Desensitization Program  11/06/2022: dowel HEP  GOALS: Goals reviewed with patient? Yes   SHORT TERM GOALS: Target date:  10/30/22  Patient will improve overhead reaching > 100 degrees for B shoulders to assist with ADLs. Baseline: R 100* L 90* Goal status: IN PROGRESS 11/18/22 - still limited in sitting without assistance but > 90 degrees with pulley or supine in bed, during table and wall slides  2.  Patient will demonstrate at least 30 lbs RUE and 15 lbs LUE grip strength as needed to open containers and carry lightweight objects without dropping them.  Baseline: Right Avg 19.8 lbs Left Avg 7.2 lbs Goal status: IN PROGRESS 11/11/22: Right 29.3, 24.6, 25.3 Left 14.3, 12.1, 12.1 Average: Right: 26.4 lbs Left: 12.8 lbs  3.  Patient will complete nine-hole peg with use of RUE in 35 seconds or less and LUE 75 seconds or less.  Baseline: 9 Hole Peg test: Right: 45.22  sec; Left: 1:40.28  sec Goal status: MET  11/11/22 Right: 32.29 sec Left: 45.96 sec  4.  Patient will demonstrate UE strengthening HEP (putty/theraband etc) with 25% verbal cues or less for proper execution. Baseline: Has various materials from hospital. Goal status: MET  5.  Patient will demonstrate good digital A/AROM HEP to minimize stiffness in hands (esp L) which impacts coordination ie) in hand manipulation of small objects. Baseline: Stiff digital extension with decreased end range and difficulty with managing pegs for 9 hole peg test ie) dropped items etc.  Goal status: MET  LONG TERM GOALS: Target date: 11/27/22  Patient will demonstrate UE strength and coordination to cut various foods using appropriate AE if necessary. Baseline: Unable to cut food/Assisted by family Goal status: MET  2.  Patient will demonstrate UE pinch strength and coordination to tie shoelaces tightly. Baseline: Shoelaces frequently unfasten Goal status: MET  3.  Patient will demonstrate at least 40 lbs RUE  and 20 lbs LUE grip strength as needed to open containers and carry lightweight objects.  Baseline: Right Avg 19.8 lbs Left Avg 7.2 lbs Goal status: IN  PROGRESS 11/11/22: Right 29.3, 24.6, 25.3 Left 14.3, 12.1, 12.1 Average: Right: 26.4 lbs Left: 12.8 lbs  4.  Patient will complete nine-hole peg with use of RUE in 30 seconds or less and LUE 40 seconds or less.  Baseline: 9 Hole Peg test: Right: 45.22  sec; Left: 1:40.28  sec Goal status: Revised 11/11/22  11/11/22 Right: 32.29 sec Left: 45.96 sec  5.  Patient will be able to complete simple food prep activities with good activity tolerance > 15 minutes and safety (ie. Decreased fall risk and implementation of safety precautions). Baseline: Family does all meal prep Goal status: 11/11/22 Discontinued - wife cooks and prepares food for him to warm in microwave (previously did so also)  6. Patient will demonstrate improvement with quick Dash score (reporting <40 % disability or less) indicating improved functional use of affected extremity.   Baseline: 88.6% (10/11 categories as Severe/Unable to perform.  Goal Status: Revised 11/18/22 Quick Dash 43.6   ASSESSMENT:  CLINICAL IMPRESSION: Patient seen today for OT for BUE dysfunction L>R s/p SCI. Patient demonstrates good improvements in UE ROM with support for shoulders with info provided re: over the door pulley.  T   PERFORMANCE DEFICITS: in functional skills including ADLs, IADLs, coordination, dexterity, proprioception, sensation, tone, ROM, strength, pain, muscle spasms, flexibility, Fine motor control, Gross motor control, mobility, balance, endurance, continence, decreased knowledge of use of DME, and UE functional use, cognitive skills including energy/drive, safety awareness, and thought, and psychosocial skills including coping strategies and routines and behaviors.   IMPAIRMENTS: are limiting patient from ADLs, IADLs, rest and sleep, work, leisure, and social participation.   CO-MORBIDITIES: may have co-morbidities  that affects occupational performance. Patient will benefit from skilled OT to address above impairments and improve  overall function.  REHAB POTENTIAL: Good   PLAN:  OT FREQUENCY: 2x/week  OT DURATION: 8 weeks  PLANNED INTERVENTIONS: self care/ADL training, therapeutic exercise, therapeutic activity, neuromuscular re-education, manual therapy, balance training, functional mobility training, splinting, fluidotherapy, patient/family education, energy conservation, coping strategies training, and DME and/or AE instructions  RECOMMENDED OTHER SERVICES: Patient had outpt PT evaluation today also.  CONSULTED AND AGREED WITH PLAN OF CARE: Patient and family member/caregiver  PLAN FOR NEXT SESSIONs:   11/27/22 - UPOC due  Check on cutting food at home s/p practice with putty with B UE coordination/strength improvements.  Progress Coordination and sensory stimulation activities for HEP Review dowel HEP and shoulder strengthening and ROM and progress from table top slides to wall slides  PRINT and UPDATE theraband HEP for UE strengthening. Pt issued yellow, green and blue therabands for HEP in preparation for D/C at the hospital but images were hand drawn by his report. Exercises located in inpt OT notes included external rotation, diagonal pulls, bicep curls, triceps extension, shoulder horizontal abduction. Pt demonstrated exercises and verbal understood tasks. Outpt OT to review, update and print.   Pt may need folder/binder to keep up with information.    Victorino Sparrow, OT 11/25/2022, 5:13 PM

## 2022-11-27 ENCOUNTER — Ambulatory Visit: Payer: No Typology Code available for payment source | Admitting: Occupational Therapy

## 2022-12-10 ENCOUNTER — Ambulatory Visit: Payer: No Typology Code available for payment source | Admitting: Physical Therapy

## 2022-12-10 ENCOUNTER — Encounter: Payer: Self-pay | Admitting: Physical Therapy

## 2022-12-10 VITALS — BP 123/78 | HR 70

## 2022-12-10 DIAGNOSIS — M6281 Muscle weakness (generalized): Secondary | ICD-10-CM

## 2022-12-10 DIAGNOSIS — R2681 Unsteadiness on feet: Secondary | ICD-10-CM

## 2022-12-10 DIAGNOSIS — R2689 Other abnormalities of gait and mobility: Secondary | ICD-10-CM

## 2022-12-10 DIAGNOSIS — R278 Other lack of coordination: Secondary | ICD-10-CM

## 2022-12-10 DIAGNOSIS — R29898 Other symptoms and signs involving the musculoskeletal system: Secondary | ICD-10-CM

## 2022-12-10 DIAGNOSIS — R208 Other disturbances of skin sensation: Secondary | ICD-10-CM

## 2022-12-10 NOTE — Therapy (Signed)
OUTPATIENT PHYSICAL THERAPY NEURO TREATMENT   Patient Name: Kenneth Flores MRN: [id number] DOB:[birth year], 62 y.o., male Today's Date: 12/10/2022   PCP: Norm Salt, [place] REFERRING PROVIDER: Milinda Antis, PA-C  END OF SESSION:  PT End of Session - 12/10/22 1541     Visit Number 10    Number of Visits 25   17 + 8 at re-cert 2/95   Date for PT Re-Evaluation 01/29/23   pushed out due to delay in scheduling   Authorization Type [employer] ULTRA    PT Start Time 1533    PT Stop Time 1619    PT Time Calculation (min) 46 min    Equipment Utilized During Treatment Gait belt    Activity Tolerance Patient tolerated treatment well    Behavior During Therapy WFL for tasks assessed/performed              Past Medical History:  Diagnosis Date   Hepatitis C    Past Surgical History:  Procedure Laterality Date   ANTERIOR CERVICAL DECOMP/DISCECTOMY FUSION N/A 08/21/2022   Procedure: ANTERIOR CERVICAL DECOMPRESSION/DISCECTOMY FUSION CERVICAL THREE-FOUR, CERVICAL FOUR-FIVE, CERVICAL FIVE-SIX;  Surgeon: Julio Sicks, MD;  Location: [facility] OR;  Service: Neurosurgery;  Laterality: N/A;   PELVIC FRACTURE SURGERY Right    Patient Active Problem List   Diagnosis Date Noted   Neurogenic bowel 10/16/2022   Spasticity 10/16/2022   Abnormality of gait 10/16/2022   Acquired left foot drop 10/16/2022   Chronic pain due to trauma 10/16/2022   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) 09/09/2022   Adjustment disorder with depressed mood 08/31/2022   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) 08/27/2022   Cervical spinal cord injury (HCC) 08/19/2022   Chronic hepatitis C without hepatic coma (HCC) 09/08/2021   Thrombocytopenia (HCC) 09/08/2021    ONSET DATE: 08/20/2022 (surgery date)  REFERRING DIAG: 09/16/2022  THERAPY DIAG:  Muscle weakness (generalized)  Other lack of coordination  Other symptoms and signs involving the musculoskeletal system  Other disturbances of skin  sensation  Unsteadiness on feet  Other abnormalities of gait and mobility  Rationale for Evaluation and Treatment: Rehabilitation  SUBJECTIVE:                                                                                                                                                                                             SUBJECTIVE STATEMENT: Patient received from lobby ambulating with SPC.  He states he has had no falls or near falls since last visit.  He states he has more burning in feet over the last couple of days.  On ambulation into clinic his  LLE appears less controlled with more notable inversion of left foot during swing phase and some mild foot slap during heel strike.  He states he and his wife have been walking in the evening and his HEP is going well. Pt accompanied by: significant other-stepson dropped him off, [name] picking him up (not present during session)  PERTINENT HISTORY: Chronic Hep C  Patient had syncopal episode in bathroom at home when urinating on 08/19/2022.  When he fell backwards he hit his head.  On 5/30, he was taken to the OR by Dr. Jordan Likes for C3-C6 anterior cervical discectomy and interbody fusion with anterior plate instrumentation secondary to multilevel cervical degenerative disease with disc space collapse, spondylosis and critical spinal stenosis with severe cord compression and signal change at level C3-4.  PAIN:  Are you having pain? Yes: NPRS scale: 6/10 Pain location: bilateral shoulders and arms Pain description: vibrations and tingling, shooting pains Aggravating factors: random, overworking the arm Relieving factors: pain medicines  PRECAUTIONS: Cervical and Fall-per Dr. Jordan Likes in-basket 7/24 pt has no restrictions.  WEIGHT BEARING RESTRICTIONS: No  FALLS: Has patient fallen in last 6 months? Yes. Number of falls 1-fall that caused surgical need  LIVING ENVIRONMENT: Lives with: lives with their spouse Lives in:  House/apartment Stairs: Yes: Internal: 14 steps; on left going up and half of stairs have bilateral rails Has following equipment at home: Single point cane, Shower bench, and Grab bars  PLOF: Requires assistive device for independence, Needs assistance with ADLs, Needs assistance with homemaking, Needs assistance with gait, and Needs assistance with transfers  PATIENT GOALS: "To try to get better"  OBJECTIVE:   DIAGNOSTIC FINDINGS:  MRI cervical spine 5/29: IMPRESSION: 1. Positive for evidence of both Ligamentous Injury, and Abnormal Cervical Spinal Cord in the setting of trauma: - Prevertebral edema or fluid from C1 through C6. Consider anterior longitudinal ligament injury, although no discrete ligamentous disruption is identified. - mild posterior interspinous ligamentous injury at C3-C4 eccentric to the right. - Abnormal spinal cord signal from C3 to the C5 cord level. While some of this most resembles chronic Myelomalacia (see #2), a component of Acute Cord Contusion cannot be excluded - especially in the left central cord at C3.   *Underlying bulky cervical spine degeneration with spinal stenosis AND spinal cord mass effect from C3-C4 (Moderate) through C5-C6 (mild). Associated moderate to severe degenerative neural foraminal stenosis at the bilateral C4, C5, and left C6 nerve levels.   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  COGNITION: Overall cognitive status:  Wife answers for pt primarily, pt appears to have delayed processing and requires repetition of questions.   SENSATION: Light touch: WFL and pt reporting tingling all the way down the left leg  COORDINATION: LE RAMS:  slow and deliberate BLE heel-to-shin:  limited due to functional weakness  EDEMA:  None noted in BLE, pt and wife deny swelling at home, he is wearing bilateral compression stockings for DVT prevention per wife-she states she will ask MD if these  are necessary any longer.  MUSCLE TONE: Questionable left clonus  POSTURE: forward head-wearing soft cervical collar  LOWER EXTREMITY ROM/MMT:     Active  Right Eval Left Eval  Hip flexion 3+/5 2+/5  Hip extension    Hip abduction    Hip adduction    Hip internal rotation    Hip external rotation    Knee flexion    Knee extension 4-/5 3/5  Ankle  dorsiflexion 4/5 3/5  Ankle plantarflexion    Ankle inversion    Ankle eversion     (Blank rows = not tested)  BED MOBILITY:  Sit to supine Complete Independence Supine to sit Complete Independence Rolling to Right Complete Independence Rolling to Left Complete Independence *Pain with rolling  TRANSFERS: Assistive device utilized: Single point cane  Sit to stand: SBA Stand to sit: SBA Chair to chair: SBA  GAIT: Gait pattern: step through pattern, decreased arm swing- Left, decreased step length- Left, decreased stride length, and narrow BOS Distance walked: various clinic distances Assistive device utilized: Single point cane Level of assistance: SBA  FUNCTIONAL TESTS:  5 times sit to stand: 27.91 seconds w/ BUE support 10 meter walk test: 14.82 seconds w/ SPC = 0.67 m/sec OR 2.23 ft/sec Berg Balance Scale: To be assessed.  PATIENT SURVEYS:  None completed due to time.  TODAY'S TREATMENT:                                                                                                                              DATE: 12/10/2022 RUE in sitting prior to session: Vitals:   12/10/22 1540  BP: 123/78  Pulse: 70   -Manual therapy:  IASTM to upper traps, levator scapulae, rhomboids, and supraspinatus area; some pinpoint myofascial and STM to deltoid areas, time spent performing trigger point release to significant left upper trap TP.  Time spent for setup and cleanup.  RAMP:  Level of Assistance: CGA Assistive device utilized: None Ramp Comments: Pt ambulates up and down ramp w/ cautious small cadence.  CURB:  Level  of Assistance: SBA and CGA Assistive device utilized: None Curb Comments: Performed twice.  Pt able to perform forward and retro step up.  Edu on using "up with the good and down with the bad" for safety.  GAIT: Gait pattern: step through pattern, decreased step length- Right, decreased step length- Left, decreased stride length, decreased ankle dorsiflexion- Left, Left foot flat, and decreased trunk rotation Distance walked: 400 ft Assistive device utilized: None Level of assistance: SBA and CGA Comments: Pt ambulates w/ mild left foot inversion, no toe catch or LOB.  Notable left quad fatigue with increased distance today w/o severe buckle (mild hyperextension).  PATIENT EDUCATION: Education details:  Continue HEP and regular walking.  Discussed resending cert for DN to address TP - pt has had this modality before and is in agreement to try again, introduced to DN therapist and provided basic intro education with this therapist to expand on and time spent adjusting schedule to include this in ongoing POC.  Continue using cane for safety as able - rollator prn. Person educated: Patient Education method: Explanation Education comprehension: verbalized understanding and needs further education  HOME EXERCISE PROGRAM: Access Code: [email or web address] URL: [email or web address] Date: 11/25/2022 Prepared by: Camille Bal  Exercises - Side Stepping with Resistance at Thighs and Counter Support  - 1 x daily - 5 x weekly - 3  sets - 10 reps - Standing Tandem Balance with Counter Support  - 1 x daily - 5 x weekly - 1 sets - 4 reps - 30 seconds hold - Sit to Stand with Resistance Around Legs  - 1 x daily - 5 x weekly - 1-2 sets - 10 reps - Heel Raises with Counter Support  - 1 x daily - 5 x weekly - 2 sets - 12 reps - Supine Chin Tuck  - 1 x daily - 5 x weekly - 3 sets - 10 reps - Forward Backward Monster Walk with Band at Thighs and Counter Support  - 1 x daily - 5 x weekly - 3 sets - 10  reps - Supine Cervical Retraction with Towel  - 1 x daily - 5 x weekly - 1-2 sets - 10 reps - Supine Bridge  - 1 x daily - 5 x weekly - 1 sets - 15 reps - Scapular retraction with resistance  - 1 x daily - 5 x weekly - 1-2 sets - 15 reps -Cervical AROM in all planes as tolerated  You Can Walk For A Certain Length Of Time Each Day (use rollator for safety and have wife nearby for supervision)                          Walk 4-5 minutes 2-3 times per day.             Increase 2-3  minutes every week.             Work up to 20 minutes continuously once per day.               Example:                         Day 1-2           4-5 minutes     3 times per day                         Day 7-8           10-12 minutes 2-3 times per day                         Day 13-14       20-22 minutes 1-2 times per day  NEW GOALS: Goals reviewed with patient? Yes  SHORT TERM GOALS: Target date: 12/18/2022  Pt will be independent and compliant with advanced strength and balance HEP in order to maintain functional progress and improve mobility. Baseline:  Needs updates (8/28) Goal status:  INITIAL  2.  Pt will decrease 5xSTS to </=13 seconds w/o UE support in order to demonstrate decreased risk for falls and improved functional bilateral LE strength and power. Baseline: 15.91 sec w/ bilateral hands on knees (8/28) Goal status: INITIAL  3.  Pt will demonstrate a gait speed of >/=0.98 m/sec in order to decrease risk for falls using rollator. Baseline: 0.88 m/sec w/ rollator (8/28) Goal status: INITIAL  LONG TERM GOALS: Target date: 01/15/2023  1.  Pt will demonstrate a gait speed of >/=1.08 m/sec in order to decrease risk for falls using no more than a cane. Baseline: 0.88 m/sec w/ rollator (8/28) Goal status: INITIAL  2.  Pt will increase BERG balance score to >/=52/56 to demonstrate improved static balance. Baseline: 48/56 (8/28) Goal status:  REVISED  3.  Pt will ambulate >/=800 feet at no more than  modI level of assist over unlevel surfaces and grass to promote household and community access. Baseline: inside clinic level distance w/ SPC SBA; 500 ft w/ SPC SBA unlevel sidewalk (9/4) Goal status: INITIAL  4.  Patient will report average 7 day pain of </=5/10 at rest in order to demonstrate improved quality of life. Baseline: 7/10 left UE recent week average (8/28) Goal status: ONGOING  ASSESSMENT:  CLINICAL IMPRESSION: Ongoing manual work to posterior chain of trunk musculature.  He continues to have increased activation with visible and palpable significant left upper trap trigger point.  He would benefit from TPDN at future session for this and other areas upper body for improved management of pain and general mobility.  Began to progress patient away from cane today, but PT still recommending use of AD for safety at home due to fatigue and some variability in LLE mobility today.  He continues to benefit from skilled PT to address mobility deficits and assist in return to functional baseline as able.  OBJECTIVE IMPAIRMENTS: Abnormal gait, decreased activity tolerance, decreased balance, decreased cognition, decreased coordination, decreased endurance, decreased knowledge of condition, difficulty walking, decreased ROM, decreased strength, impaired sensation, impaired tone, improper body mechanics, postural dysfunction, and pain.   ACTIVITY LIMITATIONS: carrying, lifting, bending, standing, squatting, stairs, transfers, bed mobility, bathing, reach over head, and locomotion level  PARTICIPATION LIMITATIONS: meal prep, cleaning, laundry, medication management, driving, shopping, community activity, occupation, and yard work  PERSONAL FACTORS: Age, Fitness, Past/current experiences, Transportation, and 1 comorbidity: chronic Hep C  are also affecting patient's functional outcome.   REHAB POTENTIAL: Good  CLINICAL DECISION MAKING: Evolving/moderate complexity  EVALUATION COMPLEXITY:  Moderate  PLAN:  PT FREQUENCY: 1-2x/week + 1x/wk (re-cert 4/13)  PT DURATION: 8 weeks + 8 weeks (re-cert 2/44)  PLANNED INTERVENTIONS: Therapeutic exercises, Therapeutic activity, Neuromuscular re-education, Balance training, Gait training, Patient/Family education, Self Care, Stair training, Vestibular training, Orthotic/Fit training, DME instructions, Dry Needling, scar mobilization, Manual therapy, and Re-evaluation  PLAN FOR NEXT SESSION:  Gait training w/ vs w/o SPC vs rollator for long/unlevel.  Endurance.  LLE NMR-leg press (try single), hurdles, unsupported static balance, periscapular strength, IASTM for left shoulder again; ASSESS STGs!  Sadie Haber, PT, DPT 12/10/2022, 4:20 PM

## 2022-12-14 ENCOUNTER — Ambulatory Visit: Payer: No Typology Code available for payment source | Admitting: Occupational Therapy

## 2022-12-17 ENCOUNTER — Ambulatory Visit: Payer: No Typology Code available for payment source | Admitting: Physical Therapy

## 2022-12-17 ENCOUNTER — Encounter: Payer: Self-pay | Admitting: Physical Therapy

## 2022-12-17 DIAGNOSIS — R278 Other lack of coordination: Secondary | ICD-10-CM

## 2022-12-17 DIAGNOSIS — M6281 Muscle weakness (generalized): Secondary | ICD-10-CM | POA: Diagnosis not present

## 2022-12-17 DIAGNOSIS — R2689 Other abnormalities of gait and mobility: Secondary | ICD-10-CM

## 2022-12-17 DIAGNOSIS — R208 Other disturbances of skin sensation: Secondary | ICD-10-CM

## 2022-12-17 DIAGNOSIS — R29898 Other symptoms and signs involving the musculoskeletal system: Secondary | ICD-10-CM

## 2022-12-17 DIAGNOSIS — R2681 Unsteadiness on feet: Secondary | ICD-10-CM

## 2022-12-17 NOTE — Therapy (Signed)
OUTPATIENT PHYSICAL THERAPY NEURO TREATMENT   Patient Name: Kenneth Flores MRN: [id number] DOB:Nov 30, 1960, 62 y.o., male Today's Date: 12/17/2022   PCP: Norm Salt, [place] REFERRING PROVIDER: Milinda Antis, PA-C  END OF SESSION:  PT End of Session - 12/17/22 1455     Visit Number 11    Number of Visits 25   17 + 8 at re-cert 6/29   Date for PT Re-Evaluation 01/29/23   pushed out due to delay in scheduling   Authorization Type [employer]TRA    PT Start Time 1448    PT Stop Time 1530    PT Time Calculation (min) 42 min    Equipment Utilized During Treatment Gait belt    Activity Tolerance Patient tolerated treatment well    Behavior During Therapy WFL for tasks assessed/performed              Past Medical History:  Diagnosis Date   Hepatitis C    Past Surgical History:  Procedure Laterality Date   ANTERIOR CERVICAL DECOMP/DISCECTOMY FUSION N/A 08/21/2022   Procedure: ANTERIOR CERVICAL DECOMPRESSION/DISCECTOMY FUSION CERVICAL THREE-FOUR, CERVICAL FOUR-FIVE, CERVICAL FIVE-SIX;  Surgeon: Julio Sicks, MD;  Location: [facility] OR;  Service: Neurosurgery;  Laterality: N/A;   PELVIC FRACTURE SURGERY Right    Patient Active Problem List   Diagnosis Date Noted   Neurogenic bowel 10/16/2022   Spasticity 10/16/2022   Abnormality of gait 10/16/2022   Acquired left foot drop 10/16/2022   Chronic pain due to trauma 10/16/2022   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) 09/09/2022   Adjustment disorder with depressed mood 08/31/2022   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) 08/27/2022   Cervical spinal cord injury (HCC) 08/19/2022   Chronic hepatitis C without hepatic coma (HCC) 09/08/2021   Thrombocytopenia (HCC) 09/08/2021    ONSET DATE: 08/20/2022 (surgery date)  REFERRING DIAG: 09/16/2022  THERAPY DIAG:  Muscle weakness (generalized)  Other lack of coordination  Other symptoms and signs involving the musculoskeletal system  Other disturbances of skin  sensation  Unsteadiness on feet  Other abnormalities of gait and mobility  Rationale for Evaluation and Treatment: Rehabilitation  SUBJECTIVE:                                                                                                                                                                                             SUBJECTIVE STATEMENT: Patient received from lobby ambulating with SPC.  He states he has had no falls or near falls since last visit.  Left foot clearance appears better than prior session during initial ambulation in.  He states his acid reflux has been  bothering him eating.  He inquires when TPDN will be done. Pt accompanied by: significant other-[name] (not present during session)  PERTINENT HISTORY: Chronic Hep C  Patient had syncopal episode in bathroom at home when urinating on 08/19/2022.  When he fell backwards he hit his head.  On 5/30, he was taken to the OR by Dr. Jordan Likes for C3-C6 anterior cervical discectomy and interbody fusion with anterior plate instrumentation secondary to multilevel cervical degenerative disease with disc space collapse, spondylosis and critical spinal stenosis with severe cord compression and signal change at level C3-4.  PAIN:  Are you having pain? Yes: NPRS scale: 7/10 Pain location: bilateral shoulders and arms Pain description: vibrations and tingling, shooting pains Aggravating factors: random, overworking the arm Relieving factors: pain medicines  PRECAUTIONS: Cervical and Fall-per Dr. Jordan Likes in-basket 7/24 pt has no restrictions.  WEIGHT BEARING RESTRICTIONS: No  FALLS: Has patient fallen in last 6 months? Yes. Number of falls 1-fall that caused surgical need  LIVING ENVIRONMENT: Lives with: lives with their spouse Lives in: House/apartment Stairs: Yes: Internal: 14 steps; on left going up and half of stairs have bilateral rails Has following equipment at home: Single point cane, Shower bench, and Grab bars  PLOF:  Requires assistive device for independence, Needs assistance with ADLs, Needs assistance with homemaking, Needs assistance with gait, and Needs assistance with transfers  PATIENT GOALS: "To try to get better"  OBJECTIVE:   DIAGNOSTIC FINDINGS:  MRI cervical spine 5/29: IMPRESSION: 1. Positive for evidence of both Ligamentous Injury, and Abnormal Cervical Spinal Cord in the setting of trauma: - Prevertebral edema or fluid from C1 through C6. Consider anterior longitudinal ligament injury, although no discrete ligamentous disruption is identified. - mild posterior interspinous ligamentous injury at C3-C4 eccentric to the right. - Abnormal spinal cord signal from C3 to the C5 cord level. While some of this most resembles chronic Myelomalacia (see #2), a component of Acute Cord Contusion cannot be excluded - especially in the left central cord at C3.   *Underlying bulky cervical spine degeneration with spinal stenosis AND spinal cord mass effect from C3-C4 (Moderate) through C5-C6 (mild). Associated moderate to severe degenerative neural foraminal stenosis at the bilateral C4, C5, and left C6 nerve levels.   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  COGNITION: Overall cognitive status:  Wife answers for pt primarily, pt appears to have delayed processing and requires repetition of questions.   SENSATION: Light touch: WFL and pt reporting tingling all the way down the left leg  COORDINATION: LE RAMS:  slow and deliberate BLE heel-to-shin:  limited due to functional weakness  EDEMA:  None noted in BLE, pt and wife deny swelling at home, he is wearing bilateral compression stockings for DVT prevention per wife-she states she will ask MD if these are necessary any longer.  MUSCLE TONE: Questionable left clonus  POSTURE: forward head-wearing soft cervical collar  LOWER EXTREMITY ROM/MMT:     Active  Right Eval Left Eval  Hip  flexion 3+/5 2+/5  Hip extension    Hip abduction    Hip adduction    Hip internal rotation    Hip external rotation    Knee flexion    Knee extension 4-/5 3/5  Ankle dorsiflexion 4/5 3/5  Ankle plantarflexion    Ankle inversion    Ankle eversion     (Blank rows = not tested)  BED MOBILITY:  Sit to supine Complete Independence Supine to sit  Complete Independence Rolling to Right Complete Independence Rolling to Left Complete Independence *Pain with rolling  TRANSFERS: Assistive device utilized: Single point cane  Sit to stand: SBA Stand to sit: SBA Chair to chair: SBA  GAIT: Gait pattern: step through pattern, decreased arm swing- Left, decreased step length- Left, decreased stride length, and narrow BOS Distance walked: various clinic distances Assistive device utilized: Single point cane Level of assistance: SBA  FUNCTIONAL TESTS:  5 times sit to stand: 27.91 seconds w/ BUE support 10 meter walk test: 14.82 seconds w/ SPC = 0.67 m/sec OR 2.23 ft/sec Berg Balance Scale: To be assessed.  PATIENT SURVEYS:  None completed due to time.  TODAY'S TREATMENT:                                                                                                                              DATE: 12/17/2022 -Verbally reviewed HEP and walking program and pt does not need current printout. -5xSTS:  13.91 seconds w/ light BUE support, pt more steady in immediate upright than assessment prior - using rollator:  9.53 seconds mod I = 1.05 m/sec OR 3.46 ft/sec - using SPC:  8.87 seconds SBA = 1.13 m/sec OR 3.72 ft/sec  IASTM to left upper trap and rhomboids, left upper trap trigger point release, STM to thoracolumbar paraspinals  PATIENT EDUCATION: Education details:  Continue HEP and regular walking.  Reminded pt and wife of TPDN appts w/ second therapist.  Continue using cane for safety as able - rollator prn. Person educated: Patient Education method: Explanation Education  comprehension: verbalized understanding and needs further education  HOME EXERCISE PROGRAM: Access Code: [email or web address] URL: [email or web address] Date: 11/25/2022 Prepared by: Camille Bal  Exercises - Side Stepping with Resistance at Thighs and Counter Support  - 1 x daily - 5 x weekly - 3 sets - 10 reps - Standing Tandem Balance with Counter Support  - 1 x daily - 5 x weekly - 1 sets - 4 reps - 30 seconds hold - Sit to Stand with Resistance Around Legs  - 1 x daily - 5 x weekly - 1-2 sets - 10 reps - Heel Raises with Counter Support  - 1 x daily - 5 x weekly - 2 sets - 12 reps - Supine Chin Tuck  - 1 x daily - 5 x weekly - 3 sets - 10 reps - Forward Backward Monster Walk with Band at Thighs and Counter Support  - 1 x daily - 5 x weekly - 3 sets - 10 reps - Supine Cervical Retraction with Towel  - 1 x daily - 5 x weekly - 1-2 sets - 10 reps - Supine Bridge  - 1 x daily - 5 x weekly - 1 sets - 15 reps - Scapular retraction with resistance  - 1 x daily - 5 x weekly - 1-2 sets - 15 reps -Cervical AROM in all planes as tolerated  You Can Walk For A Certain Length  Of Time Each Day (use rollator for safety and have wife nearby for supervision)                          Walk 4-5 minutes 2-3 times per day.             Increase 2-3  minutes every week.             Work up to 20 minutes continuously once per day.               Example:                         Day 1-2           4-5 minutes     3 times per day                         Day 7-8           10-12 minutes 2-3 times per day                         Day 13-14       20-22 minutes 1-2 times per day  NEW GOALS: Goals reviewed with patient? Yes  SHORT TERM GOALS: Target date: 12/18/2022  Pt will be independent and compliant with advanced strength and balance HEP in order to maintain functional progress and improve mobility. Baseline:  Updated and patient compliant and IND (9/26) Goal status:  MET  2.  Pt will decrease  5xSTS to </=13 seconds w/o UE support in order to demonstrate decreased risk for falls and improved functional bilateral LE strength and power. Baseline: 15.91 sec w/ bilateral hands on knees (8/28); 13.91 seconds w/ light BUE support (9/26) Goal status: PARTIALLY MET  3.  Pt will demonstrate a gait speed of >/=0.98 m/sec in order to decrease risk for falls using rollator. Baseline: 0.88 m/sec w/ rollator (8/28); 1.05 m/sec w/ rollator  (9/26) Goal status: MET  LONG TERM GOALS: Target date: 01/15/2023  1.  Pt will demonstrate a gait speed of >/=1.08 m/sec in order to decrease risk for falls using no more than a cane. Baseline: 0.88 m/sec w/ rollator (8/28) Goal status: INITIAL  2.  Pt will increase BERG balance score to >/=52/56 to demonstrate improved static balance. Baseline: 48/56 (8/28) Goal status: REVISED  3.  Pt will ambulate >/=800 feet at no more than modI level of assist over unlevel surfaces and grass to promote household and community access. Baseline: inside clinic level distance w/ SPC SBA; 500 ft w/ SPC SBA unlevel sidewalk (9/4) Goal status: INITIAL  4.  Patient will report average 7 day pain of </=5/10 at rest in order to demonstrate improved quality of life. Baseline: 7/10 left UE recent week average (8/28) Goal status: ONGOING  ASSESSMENT:  CLINICAL IMPRESSION: Time spent assessing STGs this session with patient making good progress towards all goals.  He just narrowly missed goal #2 as written completing 5xSTS in 13.91 seconds w/ light BUE support.  His gait speed was 1.05 m/sec w/ rollator, but he is ambulating with a cane for majority of time now.  Time spent for ongoing addressing of tight musculature of back and shoulders to improve pain and functionality.  He continues to benefit from skilled PT interventions for the previously mentioned reasons and to progress him towards baseline  as able.  OBJECTIVE IMPAIRMENTS: Abnormal gait, decreased activity tolerance,  decreased balance, decreased cognition, decreased coordination, decreased endurance, decreased knowledge of condition, difficulty walking, decreased ROM, decreased strength, impaired sensation, impaired tone, improper body mechanics, postural dysfunction, and pain.   ACTIVITY LIMITATIONS: carrying, lifting, bending, standing, squatting, stairs, transfers, bed mobility, bathing, reach over head, and locomotion level  PARTICIPATION LIMITATIONS: meal prep, cleaning, laundry, medication management, driving, shopping, community activity, occupation, and yard work  PERSONAL FACTORS: Age, Fitness, Past/current experiences, Transportation, and 1 comorbidity: chronic Hep C  are also affecting patient's functional outcome.   REHAB POTENTIAL: Good  CLINICAL DECISION MAKING: Evolving/moderate complexity  EVALUATION COMPLEXITY: Moderate  PLAN:  PT FREQUENCY: 1-2x/week + 1x/wk (re-cert 4/09)  PT DURATION: 8 weeks + 8 weeks (re-cert 8/11)  PLANNED INTERVENTIONS: Therapeutic exercises, Therapeutic activity, Neuromuscular re-education, Balance training, Gait training, Patient/Family education, Self Care, Stair training, Vestibular training, Orthotic/Fit training, DME instructions, Dry Needling, scar mobilization, Manual therapy, and Re-evaluation  PLAN FOR NEXT SESSION:  Gait training w/ vs w/o SPC vs rollator for long/unlevel.  Endurance.  LLE NMR-leg press (try single), hurdles, unsupported static balance, periscapular strength, IASTM for left shoulder again  Sadie Haber, PT, DPT 12/17/2022, 3:34 PM

## 2022-12-23 ENCOUNTER — Encounter: Payer: Self-pay | Admitting: Physical Therapy

## 2022-12-23 ENCOUNTER — Ambulatory Visit: Payer: PRIVATE HEALTH INSURANCE | Admitting: Occupational Therapy

## 2022-12-23 ENCOUNTER — Ambulatory Visit: Payer: PRIVATE HEALTH INSURANCE | Attending: Physician Assistant | Admitting: Physical Therapy

## 2022-12-23 DIAGNOSIS — R208 Other disturbances of skin sensation: Secondary | ICD-10-CM | POA: Insufficient documentation

## 2022-12-23 DIAGNOSIS — M62838 Other muscle spasm: Secondary | ICD-10-CM | POA: Diagnosis present

## 2022-12-23 DIAGNOSIS — R278 Other lack of coordination: Secondary | ICD-10-CM | POA: Insufficient documentation

## 2022-12-23 DIAGNOSIS — M6281 Muscle weakness (generalized): Secondary | ICD-10-CM | POA: Insufficient documentation

## 2022-12-23 DIAGNOSIS — M79602 Pain in left arm: Secondary | ICD-10-CM | POA: Diagnosis present

## 2022-12-23 DIAGNOSIS — R2689 Other abnormalities of gait and mobility: Secondary | ICD-10-CM | POA: Diagnosis present

## 2022-12-23 DIAGNOSIS — R29898 Other symptoms and signs involving the musculoskeletal system: Secondary | ICD-10-CM | POA: Diagnosis present

## 2022-12-23 DIAGNOSIS — R2681 Unsteadiness on feet: Secondary | ICD-10-CM | POA: Insufficient documentation

## 2022-12-23 DIAGNOSIS — R29818 Other symptoms and signs involving the nervous system: Secondary | ICD-10-CM | POA: Diagnosis present

## 2022-12-23 NOTE — Therapy (Unsigned)
OUTPATIENT OCCUPATIONAL THERAPY NEURO TREATMENT & PROGRESS NOTE  Patient Name: Samrath Pelt MRN: [id number] DOB:05-05-1960, 62 y.o., male Today's Date: 12/23/2022  PCP: Norm Salt, [place] REFERRING PROVIDER: Milinda Antis, PA-C  END OF SESSION:  OT End of Session - 12/23/22 1446     Visit Number 12    Number of Visits 16   + Evaluation   Date for OT Re-Evaluation 11/27/22    Authorization Type [employer] 2024 VL: 60 (PT/OT/ST)    OT Start Time 1447    OT Stop Time 1530    OT Time Calculation (min) 43 min    Activity Tolerance Patient tolerated treatment well    Behavior During Therapy WFL for tasks assessed/performed             Past Medical History:  Diagnosis Date   Hepatitis C    Past Surgical History:  Procedure Laterality Date   ANTERIOR CERVICAL DECOMP/DISCECTOMY FUSION N/A 08/21/2022   Procedure: ANTERIOR CERVICAL DECOMPRESSION/DISCECTOMY FUSION CERVICAL THREE-FOUR, CERVICAL FOUR-FIVE, CERVICAL FIVE-SIX;  Surgeon: Julio Sicks, MD;  Location: [facility] OR;  Service: Neurosurgery;  Laterality: N/A;   PELVIC FRACTURE SURGERY Right    Patient Active Problem List   Diagnosis Date Noted   Neurogenic bowel 10/16/2022   Spasticity 10/16/2022   Abnormality of gait 10/16/2022   Acquired left foot drop 10/16/2022   Chronic pain due to trauma 10/16/2022   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) 09/09/2022   Adjustment disorder with depressed mood 08/31/2022   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   Pt accompanied by: self   PERTINENT HISTORY:   Hx of chronic Hep C, status post micturition related syncope from vasovagal episode at home on 08/19/2022 resulting in fall with blow to head. Imaging revealed evidence of significant multilevel cervical degenerative disease with associated disc space collapse and associated spondylosis. Patient with critical spinal stenosis with severe cord compression and cord signal change at C3-4. Severe stenosis at C4-5 and C5-6 without marked cord signal abnormality at these levels. There is some changes posteriorly from C3-C6 with some worrisome findings for some posterior ligamentous injury. There was no evidence of fracture on his CT scan. There is no evidence of malalignment. Neurosurgery consulted and admitted the patient for IV steroids and pain control. On 5/30, he was taken to the OR and underwent C3-4, C4-5, C5-6 anterior cervical discectomy with interbody fusion utilizing interbody cages, local harvested autograft, and anterior plate instrumentation. Motor function improved.   In patient Rehab 08/27/2022 - 09/16/2022   Discharge Diagnoses:  Principal Problem:   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-6 level [facility]) Active Problems:   Adjustment disorder with depressed mood Active problems: Functional deficits secondary to incomplete spinal cord lesion at C5-C7 level Constipation History of chronic hepatitis C Neurogenic bladder with urinary retention Neurogenic bowel Hypertension  PRECAUTIONS: Cervical and Other: soft collar when moving/walking but takes it off at rest and when sleeping 12/23/22 - No collar  anymore. WEIGHT BEARING RESTRICTIONS: No  PAIN:  Are you having pain? Yes: NPRS scale: 7/10 Pain location: L arm, shoulder and across shoulder and lower back today Pain description: nerve pain  in shoulders/arm but aching in back Aggravating factors: Overworking it, doing too much Relieving factors: rest/relaxing, oral medication 4x/day, heating pad sometimes, BIoFreeze   FALLS: Has patient fallen in last 6 months? Yes. Number of falls Previously 2 related to same vasovagal issues - the first fall without injury was a couple of weeks prior to the second fall when he hit his head on the sink   Additional fall around 10/06/22 when his legs did a split while trying to walk at home  LIVING ENVIRONMENT: Lives with: lives with their spouse - [name] Lives in: House/apartment Stairs: Yes: Internal: 14 steps; on right going up, on left going up, and only half way up on the R side; wife walks with him upstairs to the bedrooms Has following equipment at home: Single point cane, Shower bench, and Grab bars  PLOF: Independent, driving, worked as Administrator (x40 hours/week)  PATIENT GOALS: Get my L arm better, tie shoelaces tighter so they stay tied  OBJECTIVE:   HAND DOMINANCE: Right  ADLs: Overall ADLs: Min to mod assist Transfers/ambulation related to ADLs: SBA with cane Eating: Needs help to cut his food Grooming: He tries to shave himself but gets help for the things he can't reach/get to on his own. UB Dressing: He can dress himself with simple clothes but needs extra time but needs help with fasteners LB Dressing: Needs help to get TED hose on and tie shoelaces Toileting: MI but wife is still monitoring him Bathing: Wife assists with his back and is present during showers Tub Shower transfers: Tub transfer bench - wife is there for extra support due to him being wobbly and has been trying to step into the tub with grab bars rather than sitting on the bench all the time Equipment: Transfer tub bench and Grab bars  IADLs: Shopping: Wife is performing shopping. Light housekeeping: Dependent on family ie) Not able to safely or effectively help take out the trash, mow the lawn  etc. Meal Prep: Wife performs this.  He did help with this a little previously. Community mobility: Uses cane with supervision Medication management: No meds previously but now 4x/day.  Using pill box and patient did sort them himself the last time but his wife is checking to make sure they are accurate and calls him to remind him to take his medication as needed (ie if she is working Catering manager). Financial management: Wife is helping more than she needed to previously Handwriting: 50% legible - Patient reports his current writing is not as good as before (about 50% compared to before) but it has improved significantly from when he had to mark/sign with an "X" only.    Patient is never home alone (if his wife is at work, his stepson will be there).  He stays on the bottom level of the home during the daytime and is off the narcotic in the daytime.  He uses his cane in the house  MOBILITY STATUS: Needs Assist: supervision for safety  POSTURE COMMENTS:  forward head Sitting balance: WFL  ACTIVITY TOLERANCE: Activity tolerance: Decreased.  Patient reports falling asleep/napping daily at this time (combo of medication etc)  FUNCTIONAL OUTCOME MEASURES: Quick Dash: 88.6  (see eval report for complete list of limitations).  11/17/22 Quick Dash: 43.2   UPPER EXTREMITY ROM:  Active ROM Right eval Left eval  Shoulder flexion 100 90  Shoulder abduction 90 75  Shoulder adduction    Shoulder extension Limited end range   Shoulder internal rotation   Shoulder external rotation    Elbow flexion Limited end range  Elbow extension Decreased end range (L side spasmed with stretch  Wrist flexion    Wrist extension    Wrist ulnar deviation    Wrist radial deviation    Wrist pronation    Wrist supination    Digital extension Decreased end range and slow to open   (Blank rows = not formally tested at eval)  UPPER EXTREMITY MMT:     MMT Right eval Left eval  Shoulder flexion 3- 3-  Shoulder  abduction    Shoulder adduction    Shoulder extension    Shoulder internal rotation    Shoulder external rotation    Middle trapezius    Lower trapezius    Elbow flexion 3 3-  Elbow extension    Wrist flexion    Wrist extension    Wrist ulnar deviation    Wrist radial deviation    Wrist pronation    Wrist supination    (Blank rows = not formally tested at eval)  HAND FUNCTION: Grip strength: Right: 21.1, 18.2, 20.0 lbs; Left: 8.5, 5.7, 7.4  lbs Evaluation: Right Avg 19.8 lbs Left Avg 7.2 lbs  11/11/22: Right 29.3, 24.6, 25.3 Left 14.3, 12.1, 12.1  COORDINATION: 9 Hole Peg test: Right: 45.22  sec; Left: 1:40.28  sec   11/11/22 Right: 32.29 sec Left: 45.96 sec  Box and Blocks:  Right 41 blocks, Left 31 blocks  11/11/22 Right: 41 blocks, Left: 35 blocks   SENSATION: Not tested  EDEMA: None noted in UEs  MUSCLE TONE: Patient has some stiffness with ROM  COGNITION: Overall cognitive status: Within functional limits for tasks assessed BIMS from hospital (08/2022) 15/15  VISION: Subjective report: No changes since accident Baseline vision: Bifocals Visual history: NA  VISION ASSESSMENT: Not tested  Patient has no changes or difficulty with any activities due to vision.  PERCEPTION: Not tested  PRAXIS: Not tested  EVALUATION OBSERVATIONS: Patient seen following physical therapy services this afternoon.  He arrived ambulating with a single-point cane.  Patient is accompanied by his wife.  Patient is soft-spoken and noted to refer to his wife for answers throughout session.  He has noted to have limitations in bilateral upper extremity range of motion left greater than right with limited success with overhead reaching and extended time for fine motor tasks with both sides but particularly left upper extremity.   TODAY'S TREATMENT:                                                                                                                               Bed/sleep  positions FM/grip strength test Masage cane  - Manual Techniques completed for shoulder and back stiffness. Active Release Techniques used  in sitting where trigger points, palpable tightness and adhesions in shoulder and upper back, were located through touch, and then deep pressure to these areas were coupled with patient's slow active movements to help relax, release and improve ROM in musculature.  In addition,  slow, palmar sliding force applied to deep fascia of back with the patient in supine.  - Therapeutic exercises completed for shoulder ROM including modified shoulder flexion in prone to maximize comfort and ROM > 90 degrees as seated shoulder flexion > 90 degrees continues to be uncomfortable and effortful due to discomfort and increased sensory changes. Patient is encouraged to perform table and wall slides, while adding prone ROM on bed as these motions have minimal to no sensory changes or discomfort limiting ROM ie) able to lift higher than 90 degrees laying in the bed and using pulley to lift arms up.  Cues to reduce shoulder hike still needed. PATIENT EDUCATION: Education details: shoulder ROM in prone Person educated: Patient Education method: Explanation, Demonstration, and Verbal cues Education comprehension: verbalized understanding, returned demonstration, verbal cues required, and needs further education  HOME EXERCISE PROGRAM: 10/14/22: Putty Exercises - Access Code: [place] 10/21/22: Added Table Top slide to HEP - Same Access code 10/23/2022: coordination HEP 10/28/22: Desensitization Program  11/06/2022: dowel HEP  GOALS: Goals reviewed with patient? Yes   SHORT TERM GOALS: Target date: 12/22/22  Patient will improve overhead reaching > 100 degrees for B shoulders to assist with ADLs. Baseline: R 100* L 90* Goal status: IN PROGRESS 11/18/22 - still limited in sitting without assistance but > 90 degrees with pulley or supine in bed, during table and wall slides  2.   Patient will demonstrate at least 30 lbs RUE and 15 lbs LUE grip strength as needed to open containers and carry lightweight objects without dropping them.  Baseline: Right Avg 19.8 lbs Left Avg 7.2 lbs Goal status: IN PROGRESS 11/11/22: Right 29.3, 24.6, 25.3 Left 14.3, 12.1, 12.1 Average: Right: 26.4 lbs Left: 12.8 lbs 12/23/22: Right 30.4, 37.0 43.6 Left 19.6, 20.0, 22.2 - Makes L side tingly  3.  Patient will complete nine-hole peg with use of RUE in 35 seconds or less and LUE 75 seconds or less.  Baseline: 9 Hole Peg test: Right: 45.22  sec; Left: 1:40.28  sec Goal status: MET  11/11/22 Right: 32.29 sec Left: 45.96 sec   4.  Patient will demonstrate UE strengthening HEP (putty/theraband etc) with 25% verbal cues or less for proper execution. Baseline: Has various materials from hospital. Goal status: MET  5.  Patient will demonstrate good digital A/AROM HEP to minimize stiffness in hands (esp L) which impacts coordination ie) in hand manipulation of small objects. Baseline: Stiff digital extension with decreased end range and difficulty with managing pegs for 9 hole peg test ie) dropped items etc.  Goal status: MET  LONG TERM GOALS: Target date: 01/20/23  Patient will demonstrate UE strength and coordination to cut various foods using appropriate AE if necessary. Baseline: Unable to cut food/Assisted by family Goal status: MET  2.  Patient will demonstrate UE pinch strength and coordination to tie shoelaces tightly. Baseline: Shoelaces frequently unfasten Goal status: MET  3.  Patient will demonstrate at least 40 lbs RUE and 20 lbs LUE grip strength as needed to open containers and carry lightweight objects.  Baseline: Right Avg 19.8 lbs Left Avg 7.2 lbs Goal status: IN PROGRESS 11/11/22: Right 29.3, 24.6, 25.3 Left 14.3, 12.1, 12.1 Average: Right: 26.4 lbs Left: 12.8 lbs  4.  Patient will complete nine-hole peg with use of RUE in 30 seconds or less and LUE 40 seconds or less.   Baseline: 9 Hole Peg test: Right: 45.22  sec; Left: 1:40.28  sec Goal status: Revised 11/11/22  11/11/22 Right: 32.29 sec Left: 45.96 sec 12/23/22 Right: 34.4 and 31.81 sec Left 47.6 and 40.66 sec  5.  Patient will be able to complete simple food prep activities with good activity tolerance > 15 minutes and safety (ie. Decreased fall risk and implementation of safety precautions). Baseline: Family does all meal prep Goal status: 11/11/22 Discontinued - wife cooks and prepares food for him to warm in microwave (previously did so also)  6. Patient will demonstrate improvement with quick Dash score (reporting <40 % disability or less) indicating improved functional use of affected extremity.   Baseline: 88.6% (10/11 categories as Severe/Unable to perform.  Goal Status: Revised 11/18/22 Quick Dash 43.6   ASSESSMENT:  CLINICAL IMPRESSION: Patient is a 62 y.o. male who was seen today for occupational therapy treatment for BUE dysfunction L>R s/p SCI. Patient currently presents below baseline level of functioning with ongoing use of walker and difficulty with B shoulder ROM > 90 degrees. Pt will benefit from further skilled OT services in the outpatient setting to work on impairments with frequency recommended 1x/week.    This 10th treatment progress note is for dates: 09/25/22 to 12/23/2022. Pt has met 3/5 STGs and 4/6 LTGs (with 2 goals revised). Pt making progress towards goals as expected and continues to benefit from skilled OT services in the outpatient setting to work towards remaining goals or until max rehab potential is met.     PERFORMANCE DEFICITS: in functional skills including ADLs, IADLs, coordination, dexterity, proprioception, sensation, tone, ROM, strength, pain, muscle spasms, flexibility, Fine motor control, Gross motor control, mobility, balance, endurance, continence, decreased knowledge of use of DME, and UE functional use, cognitive skills including energy/drive, safety awareness,  and thought, and psychosocial skills including coping strategies and routines and behaviors.   IMPAIRMENTS: are limiting patient from ADLs, IADLs, rest and sleep, work, leisure, and social participation.   CO-MORBIDITIES: may have co-morbidities  that affects occupational performance. Patient will benefit from skilled OT to address above impairments and improve overall function.  REHAB POTENTIAL: Good   PLAN:  OT FREQUENCY: 1x/week  OT DURATION: 6 weeks  PLANNED INTERVENTIONS: self care/ADL training, therapeutic exercise, therapeutic activity, neuromuscular re-education, manual therapy, balance training, functional mobility training, splinting, fluidotherapy, patient/family education, energy conservation, coping strategies training, and DME and/or AE instructions  RECOMMENDED OTHER SERVICES: Patient had outpt PT evaluation today also.  CONSULTED AND AGREED WITH PLAN OF CARE: Patient and family member/caregiver  PLAN FOR NEXT SESSIONs:   Review and progress shoulder ROM (prone and dowel HEP and shoulder strengthening and ROM and progress from table top slides to wall slides  As able - PRINT and UPDATE theraband HEP for UE strengthening. Progress Coordination and sensory stimulation activities for HEP Pt may need folder/binder to keep up with information.    Victorino Sparrow, OT 12/23/2022, 5:53 PM

## 2022-12-23 NOTE — Therapy (Signed)
OUTPATIENT PHYSICAL THERAPY NEURO TREATMENT   Patient Name: Kenneth Flores MRN: [id number] DOB:10/04/1960, 62 y.o., male Today's Date: 12/23/2022   PCP: Norm Salt, [place] REFERRING PROVIDER: Milinda Antis, PA-C  END OF SESSION:  PT End of Session - 12/23/22 1537     Visit Number 12    Number of Visits 25   17 + 8 at re-cert 4/09   Date for PT Re-Evaluation 01/29/23   pushed out due to delay in scheduling   Authorization Type [employer]TRA    PT Start Time 1532    PT Stop Time 1615    PT Time Calculation (min) 43 min    Equipment Utilized During Treatment Gait belt    Activity Tolerance Patient tolerated treatment well    Behavior During Therapy WFL for tasks assessed/performed              Past Medical History:  Diagnosis Date   Hepatitis C    Past Surgical History:  Procedure Laterality Date   ANTERIOR CERVICAL DECOMP/DISCECTOMY FUSION N/A 08/21/2022   Procedure: ANTERIOR CERVICAL DECOMPRESSION/DISCECTOMY FUSION CERVICAL THREE-FOUR, CERVICAL FOUR-FIVE, CERVICAL FIVE-SIX;  Surgeon: Julio Sicks, MD;  Location: [facility] OR;  Service: Neurosurgery;  Laterality: N/A;   PELVIC FRACTURE SURGERY Right    Patient Active Problem List   Diagnosis Date Noted   Neurogenic bowel 10/16/2022   Spasticity 10/16/2022   Abnormality of gait 10/16/2022   Acquired left foot drop 10/16/2022   Chronic pain due to trauma 10/16/2022   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) 09/09/2022   Adjustment disorder with depressed mood 08/31/2022   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) 08/27/2022   Cervical spinal cord injury (HCC) 08/19/2022   Chronic hepatitis C without hepatic coma (HCC) 09/08/2021   Thrombocytopenia (HCC) 09/08/2021    ONSET DATE: 08/20/2022 (surgery date)  REFERRING DIAG: 09/16/2022  THERAPY DIAG:  Muscle weakness (generalized)  Other lack of coordination  Other symptoms and signs involving the musculoskeletal system  Unsteadiness on feet  Other  abnormalities of gait and mobility  Rationale for Evaluation and Treatment: Rehabilitation  SUBJECTIVE:                                                                                                                                                                                             SUBJECTIVE STATEMENT: Patient received from OT.  He states his left shoulder where trigger point is, is really bothering him today.  He has started medicine to stimulate his appetite today.  He states he has not had any falls, but he almost felt like he needed to use his  rollator today.  He is ambulating with SPC today. Pt accompanied by: significant other-[name] (not present during session)  PERTINENT HISTORY: Chronic Hep C  Patient had syncopal episode in bathroom at home when urinating on 08/19/2022.  When he fell backwards he hit his head.  On 5/30, he was taken to the OR by Dr. Jordan Likes for C3-C6 anterior cervical discectomy and interbody fusion with anterior plate instrumentation secondary to multilevel cervical degenerative disease with disc space collapse, spondylosis and critical spinal stenosis with severe cord compression and signal change at level C3-4.  PAIN:  Are you having pain? Yes: NPRS scale: 7/10 Pain location: neck and left upper trap Pain description: tight shooting pains Aggravating factors: random, overworking the arm Relieving factors: pain medicines  PRECAUTIONS: Cervical and Fall-per Dr. Jordan Likes in-basket 7/24 pt has no restrictions.  WEIGHT BEARING RESTRICTIONS: No  FALLS: Has patient fallen in last 6 months? Yes. Number of falls 1-fall that caused surgical need  LIVING ENVIRONMENT: Lives with: lives with their spouse Lives in: House/apartment Stairs: Yes: Internal: 14 steps; on left going up and half of stairs have bilateral rails Has following equipment at home: Single point cane, Shower bench, and Grab bars  PLOF: Requires assistive device for independence, Needs  assistance with ADLs, Needs assistance with homemaking, Needs assistance with gait, and Needs assistance with transfers  PATIENT GOALS: "To try to get better"  OBJECTIVE:   DIAGNOSTIC FINDINGS:  MRI cervical spine 5/29: IMPRESSION: 1. Positive for evidence of both Ligamentous Injury, and Abnormal Cervical Spinal Cord in the setting of trauma: - Prevertebral edema or fluid from C1 through C6. Consider anterior longitudinal ligament injury, although no discrete ligamentous disruption is identified. - mild posterior interspinous ligamentous injury at C3-C4 eccentric to the right. - Abnormal spinal cord signal from C3 to the C5 cord level. While some of this most resembles chronic Myelomalacia (see #2), a component of Acute Cord Contusion cannot be excluded - especially in the left central cord at C3.   *Underlying bulky cervical spine degeneration with spinal stenosis AND spinal cord mass effect from C3-C4 (Moderate) through C5-C6 (mild). Associated moderate to severe degenerative neural foraminal stenosis at the bilateral C4, C5, and left C6 nerve levels.   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  COGNITION: Overall cognitive status:  Wife answers for pt primarily, pt appears to have delayed processing and requires repetition of questions.   SENSATION: Light touch: WFL and pt reporting tingling all the way down the left leg  COORDINATION: LE RAMS:  slow and deliberate BLE heel-to-shin:  limited due to functional weakness  EDEMA:  None noted in BLE, pt and wife deny swelling at home, he is wearing bilateral compression stockings for DVT prevention per wife-she states she will ask MD if these are necessary any longer.  MUSCLE TONE: Questionable left clonus  POSTURE: forward head-wearing soft cervical collar  LOWER EXTREMITY ROM/MMT:     Active  Right Eval Left Eval  Hip flexion 3+/5 2+/5  Hip extension    Hip abduction     Hip adduction    Hip internal rotation    Hip external rotation    Knee flexion    Knee extension 4-/5 3/5  Ankle dorsiflexion 4/5 3/5  Ankle plantarflexion    Ankle inversion    Ankle eversion     (Blank rows = not tested)  BED MOBILITY:  Sit to supine Complete Independence Supine to sit Complete Independence Rolling  to Right Complete Independence Rolling to Left Complete Independence *Pain with rolling  TRANSFERS: Assistive device utilized: Single point cane  Sit to stand: SBA Stand to sit: SBA Chair to chair: SBA  GAIT: Gait pattern: step through pattern, decreased arm swing- Left, decreased step length- Left, decreased stride length, and narrow BOS Distance walked: various clinic distances Assistive device utilized: Single point cane Level of assistance: SBA  FUNCTIONAL TESTS:  5 times sit to stand: 27.91 seconds w/ BUE support 10 meter walk test: 14.82 seconds w/ SPC = 0.67 m/sec OR 2.23 ft/sec Berg Balance Scale: To be assessed.  PATIENT SURVEYS:  None completed due to time.  TODAY'S TREATMENT:                                                                                                                              DATE: 12/23/2022 -SciFit using BUE/BLE in sprints mode on level 5.0 x 8 minutes for HIIT style cardio warmup and global strengthening.  RPE 5/10 midway through task. RAMP:  Level of Assistance: SBA and CGA Assistive device utilized: Single point cane Ramp Comments: Performed x4, cues for postural correction on descent as pt leans back to avoid weight in anterior chain.  Able to correct and progress to SBA on attempts 3-4.  CURB:  Level of Assistance: SBA and CGA Assistive device utilized: Single point cane Curb Comments: Performed 3x, cues for sequencing of LE and AD and variation based on patient performance.    GAIT: Gait pattern: decreased arm swing- Left, decreased stride length, knee flexed in stance- Right, and knee flexed in stance-  Left Distance walked: 200 ft Assistive device utilized: Single point cane Level of assistance: SBA and CGA Comments: Pt sequencing cane well, having a small amount more of bilateral knee flexion today that previous session.  Able to improve stride length with short distance.  -Bilateral leg press 2x15 at 70 lbs cued for knee control -Standing on airex normal BOS eyes open > eyes closed x3 varying seconds up to 34 -Standing narrowed stance on airex unsupported with manual perturbations x 4 rounds of varying times  PATIENT EDUCATION: Education details:  Continue HEP and regular walking.  Continue using cane for safety as able - rollator prn. Person educated: Patient Education method: Explanation Education comprehension: verbalized understanding and needs further education  HOME EXERCISE PROGRAM: Access Code: [email or web address] URL: [email or web address] Date: 11/25/2022 Prepared by: Camille Bal  Exercises - Side Stepping with Resistance at Thighs and Counter Support  - 1 x daily - 5 x weekly - 3 sets - 10 reps - Standing Tandem Balance with Counter Support  - 1 x daily - 5 x weekly - 1 sets - 4 reps - 30 seconds hold - Sit to Stand with Resistance Around Legs  - 1 x daily - 5 x weekly - 1-2 sets - 10 reps - Heel Raises with Counter Support  - 1 x daily - 5 x weekly - 2  sets - 12 reps - Supine Chin Tuck  - 1 x daily - 5 x weekly - 3 sets - 10 reps - Forward Backward Monster Walk with Band at Thighs and Counter Support  - 1 x daily - 5 x weekly - 3 sets - 10 reps - Supine Cervical Retraction with Towel  - 1 x daily - 5 x weekly - 1-2 sets - 10 reps - Supine Bridge  - 1 x daily - 5 x weekly - 1 sets - 15 reps - Scapular retraction with resistance  - 1 x daily - 5 x weekly - 1-2 sets - 15 reps -Cervical AROM in all planes as tolerated  You Can Walk For A Certain Length Of Time Each Day (use rollator for safety and have wife nearby for supervision)                          Walk  4-5 minutes 2-3 times per day.             Increase 2-3  minutes every week.             Work up to 20 minutes continuously once per day.               Example:                         Day 1-2           4-5 minutes     3 times per day                         Day 7-8           10-12 minutes 2-3 times per day                         Day 13-14       20-22 minutes 1-2 times per day  NEW GOALS: Goals reviewed with patient? Yes  SHORT TERM GOALS: Target date: 12/18/2022  Pt will be independent and compliant with advanced strength and balance HEP in order to maintain functional progress and improve mobility. Baseline:  Updated and patient compliant and IND (9/26) Goal status:  MET  2.  Pt will decrease 5xSTS to </=13 seconds w/o UE support in order to demonstrate decreased risk for falls and improved functional bilateral LE strength and power. Baseline: 15.91 sec w/ bilateral hands on knees (8/28); 13.91 seconds w/ light BUE support (9/26) Goal status: PARTIALLY MET  3.  Pt will demonstrate a gait speed of >/=0.98 m/sec in order to decrease risk for falls using rollator. Baseline: 0.88 m/sec w/ rollator (8/28); 1.05 m/sec w/ rollator  (9/26) Goal status: MET  LONG TERM GOALS: Target date: 01/15/2023  1.  Pt will demonstrate a gait speed of >/=1.08 m/sec in order to decrease risk for falls using no more than a cane. Baseline: 0.88 m/sec w/ rollator (8/28) Goal status: INITIAL  2.  Pt will increase BERG balance score to >/=52/56 to demonstrate improved static balance. Baseline: 48/56 (8/28) Goal status: REVISED  3.  Pt will ambulate >/=800 feet at no more than modI level of assist over unlevel surfaces and grass to promote household and community access. Baseline: inside clinic level distance w/ SPC SBA; 500 ft w/ SPC SBA unlevel sidewalk (9/4) Goal status: INITIAL  4.  Patient will  report average 7 day pain of </=5/10 at rest in order to demonstrate improved quality of life. Baseline:  7/10 left UE recent week average (8/28) Goal status: ONGOING  ASSESSMENT:  CLINICAL IMPRESSION: Ongoing whole body strengthening and static balance work with pt improving in both categories.  He is tolerating more intense SciFit and leg press resistance as well as demonstrating improved righting reactions with perturbations this visit.  He continues to be challenged by fluctuation of gait mechanics and some lingering weakness contributing to dynamic instability.  PT with plan to address this going forward.  OBJECTIVE IMPAIRMENTS: Abnormal gait, decreased activity tolerance, decreased balance, decreased cognition, decreased coordination, decreased endurance, decreased knowledge of condition, difficulty walking, decreased ROM, decreased strength, impaired sensation, impaired tone, improper body mechanics, postural dysfunction, and pain.   ACTIVITY LIMITATIONS: carrying, lifting, bending, standing, squatting, stairs, transfers, bed mobility, bathing, reach over head, and locomotion level  PARTICIPATION LIMITATIONS: meal prep, cleaning, laundry, medication management, driving, shopping, community activity, occupation, and yard work  PERSONAL FACTORS: Age, Fitness, Past/current experiences, Transportation, and 1 comorbidity: chronic Hep C  are also affecting patient's functional outcome.   REHAB POTENTIAL: Good  CLINICAL DECISION MAKING: Evolving/moderate complexity  EVALUATION COMPLEXITY: Moderate  PLAN:  PT FREQUENCY: 1-2x/week + 1x/wk (re-cert 5/78)  PT DURATION: 8 weeks + 8 weeks (re-cert 4/69)  PLANNED INTERVENTIONS: Therapeutic exercises, Therapeutic activity, Neuromuscular re-education, Balance training, Gait training, Patient/Family education, Self Care, Stair training, Vestibular training, Orthotic/Fit training, DME instructions, Dry Needling, scar mobilization, Manual therapy, and Re-evaluation  PLAN FOR NEXT SESSION:   Endurance.  LLE NMR-leg press (try single), hurdles - add  ankle weights to ambulatory tasks, unsupported static balance, periscapular strength, IASTM for left shoulder again, birddogs, quadruped blaze pods?  Sadie Haber, PT, DPT 12/23/2022, 5:23 PM

## 2022-12-30 ENCOUNTER — Encounter: Payer: Self-pay | Admitting: Occupational Therapy

## 2022-12-30 ENCOUNTER — Ambulatory Visit: Payer: Self-pay | Admitting: Physical Therapy

## 2022-12-31 ENCOUNTER — Ambulatory Visit: Payer: PRIVATE HEALTH INSURANCE | Admitting: Occupational Therapy

## 2022-12-31 ENCOUNTER — Ambulatory Visit: Payer: PRIVATE HEALTH INSURANCE | Admitting: Physical Therapy

## 2022-12-31 ENCOUNTER — Encounter: Payer: Self-pay | Admitting: Physical Therapy

## 2022-12-31 DIAGNOSIS — R278 Other lack of coordination: Secondary | ICD-10-CM

## 2022-12-31 DIAGNOSIS — R29898 Other symptoms and signs involving the musculoskeletal system: Secondary | ICD-10-CM

## 2022-12-31 DIAGNOSIS — M79602 Pain in left arm: Secondary | ICD-10-CM

## 2022-12-31 DIAGNOSIS — R2689 Other abnormalities of gait and mobility: Secondary | ICD-10-CM

## 2022-12-31 DIAGNOSIS — M6281 Muscle weakness (generalized): Secondary | ICD-10-CM | POA: Diagnosis not present

## 2022-12-31 DIAGNOSIS — R208 Other disturbances of skin sensation: Secondary | ICD-10-CM

## 2022-12-31 DIAGNOSIS — R2681 Unsteadiness on feet: Secondary | ICD-10-CM

## 2022-12-31 NOTE — Therapy (Signed)
OUTPATIENT PHYSICAL THERAPY NEURO TREATMENT   Patient Name: Kenneth Flores MRN: [id number] DOB:Jul 31, 1960, 62 y.o., male Today's Date: 12/31/2022   PCP: Norm Salt, [place] REFERRING PROVIDER: Milinda Antis, PA-C  END OF SESSION:  PT End of Session - 12/31/22 1535     Visit Number 13    Number of Visits 25   17 + 8 at re-cert 3/66   Date for PT Re-Evaluation 01/29/23   pushed out due to delay in scheduling   Authorization Type [employer]TRA    PT Start Time 1532    PT Stop Time 1628    PT Time Calculation (min) 56 min    Equipment Utilized During Treatment Gait belt    Activity Tolerance Patient tolerated treatment well    Behavior During Therapy WFL for tasks assessed/performed              Past Medical History:  Diagnosis Date   Hepatitis C    Past Surgical History:  Procedure Laterality Date   ANTERIOR CERVICAL DECOMP/DISCECTOMY FUSION N/A 08/21/2022   Procedure: ANTERIOR CERVICAL DECOMPRESSION/DISCECTOMY FUSION CERVICAL THREE-FOUR, CERVICAL FOUR-FIVE, CERVICAL FIVE-SIX;  Surgeon: Julio Sicks, MD;  Location: [facility] OR;  Service: Neurosurgery;  Laterality: N/A;   PELVIC FRACTURE SURGERY Right    Patient Active Problem List   Diagnosis Date Noted   Neurogenic bowel 10/16/2022   Spasticity 10/16/2022   Abnormality of gait 10/16/2022   Acquired left foot drop 10/16/2022   Chronic pain due to trauma 10/16/2022   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) 09/09/2022   Adjustment disorder with depressed mood 08/31/2022   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) 08/27/2022   Cervical spinal cord injury (HCC) 08/19/2022   Chronic hepatitis C without hepatic coma (HCC) 09/08/2021   Thrombocytopenia (HCC) 09/08/2021    ONSET DATE: 08/20/2022 (surgery date)  REFERRING DIAG: 09/16/2022  THERAPY DIAG:  Other lack of coordination  Muscle weakness (generalized)  Other disturbances of skin sensation  Other symptoms and signs involving the musculoskeletal  system  Unsteadiness on feet  Other abnormalities of gait and mobility  Rationale for Evaluation and Treatment: Rehabilitation  SUBJECTIVE:                                                                                                                                                                                             SUBJECTIVE STATEMENT: Patient received from OT.  He states as the weather gets cooler his arm is bothering him more.  He went to a football game last week and was very careful of the temperature to not overheat.  He denies falls or  acute changes. Pt accompanied by: significant other-[name] (not present during session)  PERTINENT HISTORY: Chronic Hep C  Patient had syncopal episode in bathroom at home when urinating on 08/19/2022.  When he fell backwards he hit his head.  On 5/30, he was taken to the OR by Dr. Jordan Likes for C3-C6 anterior cervical discectomy and interbody fusion with anterior plate instrumentation secondary to multilevel cervical degenerative disease with disc space collapse, spondylosis and critical spinal stenosis with severe cord compression and signal change at level C3-4.  PAIN:  Are you having pain? Yes: NPRS scale: 6/10 Pain location: neck and left upper trap Pain description: tight shooting pains Aggravating factors: random, overworking the arm Relieving factors: pain medicines  PRECAUTIONS: Cervical and Fall-per Dr. Jordan Likes in-basket 7/24 pt has no restrictions.  WEIGHT BEARING RESTRICTIONS: No  FALLS: Has patient fallen in last 6 months? Yes. Number of falls 1-fall that caused surgical need  LIVING ENVIRONMENT: Lives with: lives with their spouse Lives in: House/apartment Stairs: Yes: Internal: 14 steps; on left going up and half of stairs have bilateral rails Has following equipment at home: Single point cane, Shower bench, and Grab bars  PLOF: Requires assistive device for independence, Needs assistance with ADLs, Needs assistance with  homemaking, Needs assistance with gait, and Needs assistance with transfers  PATIENT GOALS: "To try to get better"  OBJECTIVE:   DIAGNOSTIC FINDINGS:  MRI cervical spine 5/29: IMPRESSION: 1. Positive for evidence of both Ligamentous Injury, and Abnormal Cervical Spinal Cord in the setting of trauma: - Prevertebral edema or fluid from C1 through C6. Consider anterior longitudinal ligament injury, although no discrete ligamentous disruption is identified. - mild posterior interspinous ligamentous injury at C3-C4 eccentric to the right. - Abnormal spinal cord signal from C3 to the C5 cord level. While some of this most resembles chronic Myelomalacia (see #2), a component of Acute Cord Contusion cannot be excluded - especially in the left central cord at C3.   *Underlying bulky cervical spine degeneration with spinal stenosis AND spinal cord mass effect from C3-C4 (Moderate) through C5-C6 (mild). Associated moderate to severe degenerative neural foraminal stenosis at the bilateral C4, C5, and left C6 nerve levels.   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  COGNITION: Overall cognitive status:  Wife answers for pt primarily, pt appears to have delayed processing and requires repetition of questions.   SENSATION: Light touch: WFL and pt reporting tingling all the way down the left leg  COORDINATION: LE RAMS:  slow and deliberate BLE heel-to-shin:  limited due to functional weakness  EDEMA:  None noted in BLE, pt and wife deny swelling at home, he is wearing bilateral compression stockings for DVT prevention per wife-she states she will ask MD if these are necessary any longer.  MUSCLE TONE: Questionable left clonus  POSTURE: forward head-wearing soft cervical collar  LOWER EXTREMITY ROM/MMT:     Active  Right Eval Left Eval  Hip flexion 3+/5 2+/5  Hip extension    Hip abduction    Hip adduction    Hip internal rotation     Hip external rotation    Knee flexion    Knee extension 4-/5 3/5  Ankle dorsiflexion 4/5 3/5  Ankle plantarflexion    Ankle inversion    Ankle eversion     (Blank rows = not tested)  BED MOBILITY:  Sit to supine Complete Independence Supine to sit Complete Independence Rolling to Right Complete Independence Rolling to Left  Complete Independence *Pain with rolling  TRANSFERS: Assistive device utilized: Single point cane  Sit to stand: SBA Stand to sit: SBA Chair to chair: SBA  GAIT: Gait pattern: step through pattern, decreased arm swing- Left, decreased step length- Left, decreased stride length, and narrow BOS Distance walked: various clinic distances Assistive device utilized: Single point cane Level of assistance: SBA  FUNCTIONAL TESTS:  5 times sit to stand: 27.91 seconds w/ BUE support 10 meter walk test: 14.82 seconds w/ SPC = 0.67 m/sec OR 2.23 ft/sec Berg Balance Scale: To be assessed.  PATIENT SURVEYS:  None completed due to time.  TODAY'S TREATMENT:                                                                                                                              DATE: 12/31/2022 -SciFit using BUE/BLE in sprints mode on level 6.0 x 8 minutes for HIIT style cardio warmup and global strengthening.  RPE 5-6/10 midway through task. 6 Blaze pods on random one color taps setting for improved direction changing, dynamic SLS, and reaction time.  Performed on 1 minute intervals with 30 second rest periods.  Pt requires SBA-CGA guarding. Round 1:  Firm ground lateral stepping to staggered pod setup.  20 hits. Round 2:  " setup.  31 hits. Round 3:  " setup.  31 hits. Notable errors/deficits:  Pt has single misjudged distance of LE reach causing mild LOB when trying to increase number of taps on last round. 6 Blaze pods on distraction and scanning setting for improved reaction time and UE/LE weight bearing/approximation.  Performed on 2 minute intervals with 30  second rest periods.  Pt requires SBA guarding. Round 1:  Quadruped compliant surface with pods in semi-circle setup.  55 hits. Round 2:  " setup.  64 hits. Round 3:  " setup.  51 hits. Notable errors/deficits:  Distracting and target colors increased each round until pt at 2 target and 2 distractors.  No errors made, but pt response time slowed.  -IASTM to left upper trap, periscapular and trunk region.  STM and trigger point release to left upper trap.  Time spent for setup and cleanup. -Assessment and attempt to mobilize left scapular for improved scapulohumeral rhythm without success, pt has severe winging with left arm elevation and scapular is tight in protracted upwardly rotated position.  PATIENT EDUCATION: Education details:  Continue HEP and regular walking.  Continue using cane for safety as able - rollator prn. Person educated: Patient Education method: Explanation Education comprehension: verbalized understanding and needs further education  HOME EXERCISE PROGRAM: Access Code: [email or web address] URL: [email or web address] Date: 11/25/2022 Prepared by: Camille Bal  Exercises - Side Stepping with Resistance at Thighs and Counter Support  - 1 x daily - 5 x weekly - 3 sets - 10 reps - Standing Tandem Balance with Counter Support  - 1 x daily - 5 x weekly - 1 sets - 4 reps - 30 seconds hold -  Sit to Stand with Resistance Around Legs  - 1 x daily - 5 x weekly - 1-2 sets - 10 reps - Heel Raises with Counter Support  - 1 x daily - 5 x weekly - 2 sets - 12 reps - Supine Chin Tuck  - 1 x daily - 5 x weekly - 3 sets - 10 reps - Forward Backward Monster Walk with Band at Thighs and Counter Support  - 1 x daily - 5 x weekly - 3 sets - 10 reps - Supine Cervical Retraction with Towel  - 1 x daily - 5 x weekly - 1-2 sets - 10 reps - Supine Bridge  - 1 x daily - 5 x weekly - 1 sets - 15 reps - Scapular retraction with resistance  - 1 x daily - 5 x weekly - 1-2 sets - 15  reps -Cervical AROM in all planes as tolerated  You Can Walk For A Certain Length Of Time Each Day (use rollator for safety and have wife nearby for supervision)                          Walk 4-5 minutes 2-3 times per day.             Increase 2-3  minutes every week.             Work up to 20 minutes continuously once per day.               Example:                         Day 1-2           4-5 minutes     3 times per day                         Day 7-8           10-12 minutes 2-3 times per day                         Day 13-14       20-22 minutes 1-2 times per day  NEW GOALS: Goals reviewed with patient? Yes  SHORT TERM GOALS: Target date: 12/18/2022  Pt will be independent and compliant with advanced strength and balance HEP in order to maintain functional progress and improve mobility. Baseline:  Updated and patient compliant and IND (9/26) Goal status:  MET  2.  Pt will decrease 5xSTS to </=13 seconds w/o UE support in order to demonstrate decreased risk for falls and improved functional bilateral LE strength and power. Baseline: 15.91 sec w/ bilateral hands on knees (8/28); 13.91 seconds w/ light BUE support (9/26) Goal status: PARTIALLY MET  3.  Pt will demonstrate a gait speed of >/=0.98 m/sec in order to decrease risk for falls using rollator. Baseline: 0.88 m/sec w/ rollator (8/28); 1.05 m/sec w/ rollator  (9/26) Goal status: MET  LONG TERM GOALS: Target date: 01/15/2023  1.  Pt will demonstrate a gait speed of >/=1.08 m/sec in order to decrease risk for falls using no more than a cane. Baseline: 0.88 m/sec w/ rollator (8/28) Goal status: INITIAL  2.  Pt will increase BERG balance score to >/=52/56 to demonstrate improved static balance. Baseline: 48/56 (8/28) Goal status: REVISED  3.  Pt will ambulate >/=800 feet at no more than  modI level of assist over unlevel surfaces and grass to promote household and community access. Baseline: inside clinic level distance w/  SPC SBA; 500 ft w/ SPC SBA unlevel sidewalk (9/4) Goal status: INITIAL  4.  Patient will report average 7 day pain of </=5/10 at rest in order to demonstrate improved quality of life. Baseline: 7/10 left UE recent week average (8/28) Goal status: ONGOING  ASSESSMENT:  CLINICAL IMPRESSION: Focus of skilled session today on addressing reaction time and dynamic SLS.  Incorporated LUE approximation and functional mobility with quadruped tasks.  He tolerates these challenges well, but remains somewhat limited by poor scapulohumeral mobility and left shoulder pain.  Further attempted to manage this pain with manual techniques.  Will further address with additional dry needling modality next week.  OBJECTIVE IMPAIRMENTS: Abnormal gait, decreased activity tolerance, decreased balance, decreased cognition, decreased coordination, decreased endurance, decreased knowledge of condition, difficulty walking, decreased ROM, decreased strength, impaired sensation, impaired tone, improper body mechanics, postural dysfunction, and pain.   ACTIVITY LIMITATIONS: carrying, lifting, bending, standing, squatting, stairs, transfers, bed mobility, bathing, reach over head, and locomotion level  PARTICIPATION LIMITATIONS: meal prep, cleaning, laundry, medication management, driving, shopping, community activity, occupation, and yard work  PERSONAL FACTORS: Age, Fitness, Past/current experiences, Transportation, and 1 comorbidity: chronic Hep C  are also affecting patient's functional outcome.   REHAB POTENTIAL: Good  CLINICAL DECISION MAKING: Evolving/moderate complexity  EVALUATION COMPLEXITY: Moderate  PLAN:  PT FREQUENCY: 1-2x/week + 1x/wk (re-cert 4/78)  PT DURATION: 8 weeks + 8 weeks (re-cert 2/95)  PLANNED INTERVENTIONS: Therapeutic exercises, Therapeutic activity, Neuromuscular re-education, Balance training, Gait training, Patient/Family education, Self Care, Stair training, Vestibular training,  Orthotic/Fit training, DME instructions, Dry Needling, scar mobilization, Manual therapy, and Re-evaluation  PLAN FOR NEXT SESSION:   Endurance.  LLE NMR-leg press (try single), hurdles - add ankle weights to ambulatory tasks, unsupported static balance, periscapular strength, IASTM for left shoulder again, birddogs, TPDN!  Sadie Haber, PT, DPT 12/31/2022, 5:27 PM

## 2022-12-31 NOTE — Therapy (Signed)
OUTPATIENT OCCUPATIONAL THERAPY NEURO TREATMENT  Patient Name: Kenneth Flores MRN: [id number] DOB:[birth year], 62 y.o., male Today's Date: 12/31/2022  PCP: Norm Salt, [place] REFERRING PROVIDER: Milinda Antis, PA-C  END OF SESSION:  OT End of Session - 12/31/22 1444     Visit Number 13    Number of Visits 16   + Evaluation   Date for OT Re-Evaluation 01/21/23    Authorization Type [employer] 2024 VL: 60 (PT/OT/ST)    OT Start Time 1446    OT Stop Time 1525    OT Time Calculation (min) 39 min    Activity Tolerance Patient tolerated treatment well    Behavior During Therapy WFL for tasks assessed/performed             Past Medical History:  Diagnosis Date   Hepatitis C    Past Surgical History:  Procedure Laterality Date   ANTERIOR CERVICAL DECOMP/DISCECTOMY FUSION N/A 08/21/2022   Procedure: ANTERIOR CERVICAL DECOMPRESSION/DISCECTOMY FUSION CERVICAL THREE-FOUR, CERVICAL FOUR-FIVE, CERVICAL FIVE-SIX;  Surgeon: Julio Sicks, MD;  Location: [facility] OR;  Service: Neurosurgery;  Laterality: N/A;   PELVIC FRACTURE SURGERY Right    Patient Active Problem List   Diagnosis Date Noted   Neurogenic bowel 10/16/2022   Spasticity 10/16/2022   Abnormality of gait 10/16/2022   Acquired left foot drop 10/16/2022   Chronic pain due to trauma 10/16/2022   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) 09/09/2022   Adjustment disorder with depressed mood 08/31/2022   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) 08/27/2022   Cervical spinal cord injury (HCC) 08/19/2022   Chronic hepatitis C without hepatic coma (HCC) 09/08/2021   Thrombocytopenia (HCC) 09/08/2021    ONSET DATE: Referral: 09/17/2022 Hospitalized 08/19/22  REFERRING DIAG: G82.54 (ICD-10-CM) - Quadriplegia, C5-C7 incomplete  THERAPY DIAG:  Other lack of coordination  Muscle weakness (generalized)  Other disturbances of skin sensation  Other symptoms and signs involving the musculoskeletal system  Pain in left  arm  Rationale for Evaluation and Treatment: Rehabilitation  SUBJECTIVE:   SUBJECTIVE STATEMENT:  Pt reports he gets help sometimes drying off after a shower. He gets acupuncture next week with PT.   Pt accompanied by: self   PERTINENT HISTORY:   Hx of chronic Hep C, status post micturition related syncope from vasovagal episode at home on 08/19/2022 resulting in fall with blow to head. Imaging revealed evidence of significant multilevel cervical degenerative disease with associated disc space collapse and associated spondylosis. Patient with critical spinal stenosis with severe cord compression and cord signal change at C3-4. Severe stenosis at C4-5 and C5-6 without marked cord signal abnormality at these levels. There is some changes posteriorly from C3-C6 with some worrisome findings for some posterior ligamentous injury. There was no evidence of fracture on his CT scan. There is no evidence of malalignment. Neurosurgery consulted and admitted the patient for IV steroids and pain control. On 5/30, he was taken to the OR and underwent C3-4, C4-5, C5-6 anterior cervical discectomy with interbody fusion utilizing interbody cages, local harvested autograft, and anterior plate instrumentation. Motor function improved.   In patient Rehab 08/27/2022 - 09/16/2022   Discharge Diagnoses:  Principal Problem:   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) Active Problems:   Adjustment disorder with depressed mood Active problems: Functional deficits secondary to incomplete spinal cord lesion at C5-C7 level Constipation History of chronic hepatitis C Neurogenic bladder with urinary retention Neurogenic bowel Hypertension  PRECAUTIONS: Cervical and Other: soft collar when moving/walking but takes it off  at rest and when sleeping 12/23/22 - No collar anymore. WEIGHT BEARING RESTRICTIONS: No  PAIN:  Are you having pain? Yes: NPRS scale: 7/10 Pain location: L arm, shoulder and across shoulder and  lower back today Pain description: nerve pain in shoulders/arm but aching in back Aggravating factors: Overworking it, doing too much Relieving factors: rest/relaxing, oral medication 4x/day, heating pad sometimes, BIoFreeze   FALLS: Has patient fallen in last 6 months? Yes. Number of falls Previously 2 related to same vasovagal issues - the first fall without injury was a couple of weeks prior to the second fall when he hit his head on the sink   Additional fall around 10/06/22 when his legs did a split while trying to walk at home  LIVING ENVIRONMENT: Lives with: lives with their spouse - [name] Lives in: House/apartment Stairs: Yes: Internal: 14 steps; on right going up, on left going up, and only half way up on the R side; wife walks with him upstairs to the bedrooms Has following equipment at home: Single point cane, Shower bench, and Grab bars  PLOF: Independent, driving, worked as Administrator (x40 hours/week)  PATIENT GOALS: Get my L arm better, tie shoelaces tighter so they stay tied  OBJECTIVE:   HAND DOMINANCE: Right  ADLs: Overall ADLs: Min to mod assist Transfers/ambulation related to ADLs: SBA with cane Eating: Needs help to cut his food Grooming: He tries to shave himself but gets help for the things he can't reach/get to on his own. UB Dressing: He can dress himself with simple clothes but needs extra time but needs help with fasteners LB Dressing: Needs help to get TED hose on and tie shoelaces Toileting: MI but wife is still monitoring him Bathing: Wife assists with his back and is present during showers Tub Shower transfers: Tub transfer bench - wife is there for extra support due to him being wobbly and has been trying to step into the tub with grab bars rather than sitting on the bench all the time Equipment: Transfer tub bench and Grab bars  IADLs: Shopping: Wife is performing shopping. Light housekeeping: Dependent on family ie) Not able to safely or effectively  help take out the trash, mow the lawn etc. Meal Prep: Wife performs this.  He did help with this a little previously. Community mobility: Uses cane with supervision Medication management: No meds previously but now 4x/day.  Using pill box and patient did sort them himself the last time but his wife is checking to make sure they are accurate and calls him to remind him to take his medication as needed (ie if she is working Catering manager). Financial management: Wife is helping more than she needed to previously Handwriting: 50% legible - Patient reports his current writing is not as good as before (about 50% compared to before) but it has improved significantly from when he had to mark/sign with an "X" only.    Patient is never home alone (if his wife is at work, his stepson will be there).  He stays on the bottom level of the home during the daytime and is off the narcotic in the daytime.  He uses his cane in the house  MOBILITY STATUS: Needs Assist: supervision for safety  POSTURE COMMENTS:  forward head Sitting balance: WFL  ACTIVITY TOLERANCE: Activity tolerance: Decreased.  Patient reports falling asleep/napping daily at this time (combo of medication etc)  FUNCTIONAL OUTCOME MEASURES: Quick Dash: 88.6  (see eval report for complete list of limitations).  11/17/22 Quick  Dash: 43.2   UPPER EXTREMITY ROM:    Active ROM Right eval Left eval  Shoulder flexion 100 90  Shoulder abduction 90 75  Shoulder adduction    Shoulder extension Limited end range   Shoulder internal rotation   Shoulder external rotation    Elbow flexion Limited end range  Elbow extension Decreased end range (L side spasmed with stretch  Wrist flexion    Wrist extension    Wrist ulnar deviation    Wrist radial deviation    Wrist pronation    Wrist supination    Digital extension Decreased end range and slow to open   (Blank rows = not formally tested at eval)  UPPER EXTREMITY MMT:     MMT Right eval  Left eval  Shoulder flexion 3- 3-  Shoulder abduction    Shoulder adduction    Shoulder extension    Shoulder internal rotation    Shoulder external rotation    Middle trapezius    Lower trapezius    Elbow flexion 3 3-  Elbow extension    Wrist flexion    Wrist extension    Wrist ulnar deviation    Wrist radial deviation    Wrist pronation    Wrist supination    (Blank rows = not formally tested at eval)  HAND FUNCTION: Grip strength: Right: 21.1, 18.2, 20.0 lbs; Left: 8.5, 5.7, 7.4  lbs Evaluation: Right Avg 19.8 lbs Left Avg 7.2 lbs  11/11/22: Right 29.3, 24.6, 25.3 Left 14.3, 12.1, 12.1  COORDINATION: 9 Hole Peg test: Right: 45.22  sec; Left: 1:40.28  sec   11/11/22 Right: 32.29 sec Left: 45.96 sec  Box and Blocks:  Right 41 blocks, Left 31 blocks  11/11/22 Right: 41 blocks, Left: 35 blocks  SENSATION: Not tested  EDEMA: None noted in UEs  MUSCLE TONE: Patient has some stiffness with ROM  COGNITION: Overall cognitive status: Within functional limits for tasks assessed BIMS from hospital (08/2022) 15/15  VISION: Subjective report: No changes since accident Baseline vision: Bifocals Visual history: NA  VISION ASSESSMENT: Not tested  Patient has no changes or difficulty with any activities due to vision.  PERCEPTION: Not tested  PRAXIS: Not tested  EVALUATION OBSERVATIONS: Patient seen following physical therapy services this afternoon.  He arrived ambulating with a single-point cane.  Patient is accompanied by his wife.  Patient is soft-spoken and noted to refer to his wife for answers throughout session.  He has noted to have limitations in bilateral upper extremity range of motion left greater than right with limited success with overhead reaching and extended time for fine motor tasks with both sides but particularly left upper extremity.   TODAY'S TREATMENT:                                                                                                                               - Therapeutic exercises completed for duration as noted below including: OT initiated RUE  and LUE red Thera-Band HEP including horizontal abd/add, bicep curl, and tricep extension to promote strengthening of affected extremity and overall endurance as noted in pt instructions.   Pt completed B shoulder towel stretches as noted in pt instructions for improved ROM and use during completion of functional tasks.  Wrist flex and ext with yellow flex bar x 2 min for strength and endurance of affected extremity  Supination with yellow flex bar x 2 min for strength and endurance of affected extremity  Pronation with yellow flex bar x 2 min for strength and endurance of affected extremity  - Therapeutic activities completed for duration as noted below including: With use of each hand, pt placed and then removed colored, medium pegs into corresponding hole with use of pattern sheets for ROM, coordination, and strength of affected extremity. Pt picked up 3 pegs, stored them in hand, and then placed pegs one at a time using 2 point pinch for additional fine motor challenge.  Pt completed ball rolling with 2 golf balls in L hand at table level for improved coordination.   PATIENT EDUCATION: Education details: shoulder ROM and strengthening; hand strengthening Person educated: Patient Education method: Explanation, Demonstration, Verbal cues, and Handouts Education comprehension: verbalized understanding, returned demonstration, verbal cues required, and needs further education  HOME EXERCISE PROGRAM: 10/14/22: Putty Exercises - Access Code: [facility] 10/21/22: Added Table Top slide to HEP - Same Access code 10/23/2022: coordination HEP 10/28/22: Desensitization Program  11/06/2022: dowel HEP 12/31/2022: Red theraband and towel shoulder stretch  GOALS: Goals reviewed with patient? Yes   SHORT TERM GOALS: Target date: 12/22/22  Patient will improve overhead reaching > 100  degrees for B shoulders to assist with ADLs. Baseline: R 100* L 90* Goal status: IN PROGRESS 11/18/22 - still limited in sitting without assistance but > 90 degrees with pulley or supine in bed, during table and wall slides  2.  Patient will demonstrate at least 30 lbs RUE and 15 lbs LUE grip strength as needed to open containers and carry lightweight objects without dropping them.  Baseline: Right Avg 19.8 lbs Left Avg 7.2 lbs Goal status: MET 11/11/22: Right 29.3, 24.6, 25.3 Left 14.3, 12.1, 12.1 Average: Right: 26.4 lbs Left: 12.8 lbs 12/23/22: Right 30.4, 37.0 43.6 Left 19.6, 20.0, 22.2 - Makes L side tingly Average Right: 37.0 Left 20.6 lbs  3.  Patient will complete nine-hole peg with use of RUE in 35 seconds or less and LUE 75 seconds or less.  Baseline: 9 Hole Peg test: Right: 45.22  sec; Left: 1:40.28  sec Goal status: MET  11/11/22 Right: 32.29 sec Left: 45.96 sec   4.  Patient will demonstrate UE strengthening HEP (putty/theraband etc) with 25% verbal cues or less for proper execution. Baseline: Has various materials from hospital. Goal status: MET  5.  Patient will demonstrate good digital A/AROM HEP to minimize stiffness in hands (esp L) which impacts coordination ie) in hand manipulation of small objects. Baseline: Stiff digital extension with decreased end range and difficulty with managing pegs for 9 hole peg test ie) dropped items etc.  Goal status: MET  LONG TERM GOALS: Target date: 01/21/23  Patient will demonstrate UE strength and coordination to cut various foods using appropriate AE if necessary. Baseline: Unable to cut food/Assisted by family Goal status: MET  2.  Patient will demonstrate UE pinch strength and coordination to tie shoelaces tightly. Baseline: Shoelaces frequently unfasten Goal status: MET  3.  Patient will demonstrate at least 40 lbs RUE  and 20 lbs LUE grip strength as needed to open containers and carry lightweight objects.  Baseline: Right  Avg 19.8 lbs Left Avg 7.2 lbs Goal status: IN PROGRESS 11/11/22: Right 29.3, 24.6, 25.3 Left 14.3, 12.1, 12.1 Average: Right: 26.4 lbs Left: 12.8 lbs 12/23/22: Right 30.4, 37.0 43.6 Left 19.6, 20.0, 22.2 - Makes L side tingly Average Right: 37.0 Left 20.6 lbs  4.  Patient will complete nine-hole peg with use of RUE in 30 seconds or less and LUE 40 seconds or less.  Baseline: 9 Hole Peg test: Right: 45.22  sec; Left: 1:40.28  sec Goal status: Revised 11/11/22 IN PROGRESS  11/11/22 Right: 32.29 sec Left: 45.96 sec 12/23/22 Right: 34.4 and 31.81 sec Left 47.6 and 40.66 sec  5.  Patient will be able to complete simple food prep activities with good activity tolerance > 15 minutes and safety (ie. Decreased fall risk and implementation of safety precautions). Baseline: Family does all meal prep Goal status: 11/11/22 Discontinued - wife cooks and prepares food for him to warm in microwave (previously did so also)  6. Patient will demonstrate improvement with quick Dash score (reporting <40 % disability or less) indicating improved functional use of affected extremity.   Baseline: 88.6% (10/11 categories as Severe/Unable to perform.  Goal Status: Revised 11/18/22 Quick Dash 43.6  ASSESSMENT:  CLINICAL IMPRESSION: Pt demonstrates good tolerance with completion of HEP and strengthening/stretching activities. Will continue to work toward improving B shoulder flex AROM.    PERFORMANCE DEFICITS: in functional skills including ADLs, IADLs, coordination, dexterity, proprioception, sensation, tone, ROM, strength, pain, muscle spasms, flexibility, Fine motor control, Gross motor control, mobility, balance, endurance, continence, decreased knowledge of use of DME, and UE functional use, cognitive skills including energy/drive, safety awareness, and thought, and psychosocial skills including coping strategies and routines and behaviors.   IMPAIRMENTS: are limiting patient from ADLs, IADLs, rest and sleep, work,  leisure, and social participation.   CO-MORBIDITIES: may have co-morbidities  that affects occupational performance. Patient will benefit from skilled OT to address above impairments and improve overall function.  REHAB POTENTIAL: Good   PLAN:  OT FREQUENCY: 1x/week  OT DURATION: 6 weeks  PLANNED INTERVENTIONS: self care/ADL training, therapeutic exercise, therapeutic activity, neuromuscular re-education, manual therapy, balance training, functional mobility training, splinting, fluidotherapy, patient/family education, energy conservation, coping strategies training, and DME and/or AE instructions  RECOMMENDED OTHER SERVICES: Patient had outpt PT evaluation today also.  CONSULTED AND AGREED WITH PLAN OF CARE: Patient and family member/caregiver  PLAN FOR NEXT SESSIONs:   Review and progress shoulder ROM (prone and dowel HEP and shoulder strengthening and ROM and progress from table top slides to wall slides  Review theraband HEP for UE strengthening. Progress Coordination and sensory stimulation activities for HEP Pt may need folder/binder to keep up with information.    Delana Meyer, OT 12/31/2022, 5:12 PM

## 2022-12-31 NOTE — Patient Instructions (Addendum)
 Upper Body Strengthening Exercises  Comments  Sit upright, away from the back of your chair. Keep abdominal muscles engaged i.e. pull belly button to spine.  FOR ALL EXERCISES: Repeat 10 Times  Hold 3 Seconds  Complete 1 Set  Perform 2 Times a Day   ELASTIC BAND FLEXION   Place unaffected arm on your leg or hip. With your affected arm, hold elastic band in front of you and pull the band upward towards the ceiling as shown.     ELASTIC BAND HORIZONTAL ABDUCTION - SCAPULAR RETRACTION  Start by holding elastic band in front of your chest with your elbows straight. Then, pull your arms apart and towards the side while squeezing your shoulder blades together. Return to starting position and repeat.            ELASTIC BAND TRICEPS EXTENSION  While seated, hold and fixate one end of an elastic band against your chest. Hold the other end with your opposite hand with your elbow bent and arm by your side.   Start by pulling the band downward so that the elbow goes from a bent position to a straightened position as shown. Return to starting position and repeat.   BICEPS CURL WITH BAND  While sitting in an upright position, put an elastic band underneath your feet (as pictured)  OR underneath your thighs. Grip each end of the band, keep the elbows tucked to the body, and bring hands to shoulders by bending at the elbows.

## 2023-01-05 ENCOUNTER — Other Ambulatory Visit: Payer: Self-pay

## 2023-01-05 MED ORDER — HYDROCODONE-ACETAMINOPHEN 10-325 MG PO TABS: 1.0000 | ORAL_TABLET | Freq: Four times a day (QID) | ORAL | 0 refills | Status: DC | PRN

## 2023-01-05 NOTE — Telephone Encounter (Signed)
Patient called in requesting refill on hydrocodone per pmp last fill  11/06/2022 10/16/2022 2  Hydrocodone-Acetamin 10-325 Mg 120.00 30 Me Lov [id number] [facility] ([id number]) 0/0 40.00 MME Comm Ins [place]

## 2023-01-06 ENCOUNTER — Ambulatory Visit: Payer: PRIVATE HEALTH INSURANCE | Admitting: Occupational Therapy

## 2023-01-06 ENCOUNTER — Ambulatory Visit: Payer: PRIVATE HEALTH INSURANCE | Admitting: Physical Therapy

## 2023-01-06 ENCOUNTER — Encounter: Payer: Self-pay | Admitting: Physical Therapy

## 2023-01-06 DIAGNOSIS — R2681 Unsteadiness on feet: Secondary | ICD-10-CM

## 2023-01-06 DIAGNOSIS — R208 Other disturbances of skin sensation: Secondary | ICD-10-CM

## 2023-01-06 DIAGNOSIS — M6281 Muscle weakness (generalized): Secondary | ICD-10-CM | POA: Diagnosis not present

## 2023-01-06 DIAGNOSIS — R278 Other lack of coordination: Secondary | ICD-10-CM

## 2023-01-06 DIAGNOSIS — R2689 Other abnormalities of gait and mobility: Secondary | ICD-10-CM

## 2023-01-06 DIAGNOSIS — M62838 Other muscle spasm: Secondary | ICD-10-CM

## 2023-01-06 DIAGNOSIS — M79602 Pain in left arm: Secondary | ICD-10-CM

## 2023-01-06 NOTE — Therapy (Addendum)
OUTPATIENT PHYSICAL THERAPY NEURO TREATMENT   Patient Name: Kenneth Flores MRN: [id number] DOB:[birth year], 62 y.o., male Today's Date: 01/06/2023   PCP: Norm Salt, [place] REFERRING PROVIDER: Milinda Antis, PA-C  END OF SESSION:  PT End of Session - 01/06/23 1320     Visit Number 14    Number of Visits 25   17 + 8 at re-cert 0/86   Date for PT Re-Evaluation 01/29/23   pushed out due to delay in scheduling   Authorization Type [employer] ULTRA    PT Start Time 1317    PT Stop Time 1410    PT Time Calculation (min) 53 min    Equipment Utilized During Treatment Gait belt    Activity Tolerance Patient tolerated treatment well    Behavior During Therapy WFL for tasks assessed/performed              Past Medical History:  Diagnosis Date   Hepatitis C    Past Surgical History:  Procedure Laterality Date   ANTERIOR CERVICAL DECOMP/DISCECTOMY FUSION N/A 08/21/2022   Procedure: ANTERIOR CERVICAL DECOMPRESSION/DISCECTOMY FUSION CERVICAL THREE-FOUR, CERVICAL FOUR-FIVE, CERVICAL FIVE-SIX;  Surgeon: Julio Sicks, MD;  Location: [facility] OR;  Service: Neurosurgery;  Laterality: N/A;   PELVIC FRACTURE SURGERY Right    Patient Active Problem List   Diagnosis Date Noted   Neurogenic bowel 10/16/2022   Spasticity 10/16/2022   Abnormality of gait 10/16/2022   Acquired left foot drop 10/16/2022   Chronic pain due to trauma 10/16/2022   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) 09/09/2022   Adjustment disorder with depressed mood 08/31/2022   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) 08/27/2022   Cervical spinal cord injury (HCC) 08/19/2022   Chronic hepatitis C without hepatic coma (HCC) 09/08/2021   Thrombocytopenia (HCC) 09/08/2021    ONSET DATE: 08/20/2022 (surgery date)  REFERRING DIAG: 09/16/2022  THERAPY DIAG:  Other lack of coordination  Muscle weakness (generalized)  Unsteadiness on feet  Other abnormalities of gait and mobility  Other muscle  spasm  Rationale for Evaluation and Treatment: Rehabilitation  SUBJECTIVE:                                                                                                                                                                                             SUBJECTIVE STATEMENT: Patient received from lobby ambulatory with [facility].  He states this cold weather is really bothering his arm and leg today.  He denies falls or acute changes. Pt accompanied by: significant other-[name] (not present during session)  PERTINENT HISTORY: Chronic Hep C  Patient had syncopal episode in bathroom at home  when urinating on 08/19/2022.  When he fell backwards he hit his head.  On 5/30, he was taken to the OR by Dr. Jordan Likes for C3-C6 anterior cervical discectomy and interbody fusion with anterior plate instrumentation secondary to multilevel cervical degenerative disease with disc space collapse, spondylosis and critical spinal stenosis with severe cord compression and signal change at level C3-4.  PAIN:  Are you having pain? Yes: NPRS scale: 7/10 Pain location: left arm and shoulder; BLE Pain description: tight shooting pains; burning in legs Aggravating factors: random, overworking the arm Relieving factors: pain medicines  PRECAUTIONS: Cervical and Fall-per Dr. Jordan Likes in-basket 7/24 pt has no restrictions.  WEIGHT BEARING RESTRICTIONS: No  FALLS: Has patient fallen in last 6 months? Yes. Number of falls 1-fall that caused surgical need  LIVING ENVIRONMENT: Lives with: lives with their spouse Lives in: House/apartment Stairs: Yes: Internal: 14 steps; on left going up and half of stairs have bilateral rails Has following equipment at home: Single point cane, Shower bench, and Grab bars  PLOF: Requires assistive device for independence, Needs assistance with ADLs, Needs assistance with homemaking, Needs assistance with gait, and Needs assistance with transfers  PATIENT GOALS: "To try to get  better"  OBJECTIVE:   DIAGNOSTIC FINDINGS:  MRI cervical spine 5/29: IMPRESSION: 1. Positive for evidence of both Ligamentous Injury, and Abnormal Cervical Spinal Cord in the setting of trauma: - Prevertebral edema or fluid from C1 through C6. Consider anterior longitudinal ligament injury, although no discrete ligamentous disruption is identified. - mild posterior interspinous ligamentous injury at C3-C4 eccentric to the right. - Abnormal spinal cord signal from C3 to the C5 cord level. While some of this most resembles chronic Myelomalacia (see #2), a component of Acute Cord Contusion cannot be excluded - especially in the left central cord at C3.   *Underlying bulky cervical spine degeneration with spinal stenosis AND spinal cord mass effect from C3-C4 (Moderate) through C5-C6 (mild). Associated moderate to severe degenerative neural foraminal stenosis at the bilateral C4, C5, and left C6 nerve levels.   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  COGNITION: Overall cognitive status:  Wife answers for pt primarily, pt appears to have delayed processing and requires repetition of questions.   SENSATION: Light touch: WFL and pt reporting tingling all the way down the left leg  COORDINATION: LE RAMS:  slow and deliberate BLE heel-to-shin:  limited due to functional weakness  EDEMA:  None noted in BLE, pt and wife deny swelling at home, he is wearing bilateral compression stockings for DVT prevention per wife-she states she will ask MD if these are necessary any longer.  MUSCLE TONE: Questionable left clonus  POSTURE: forward head-wearing soft cervical collar  LOWER EXTREMITY ROM/MMT:     Active  Right Eval Left Eval  Hip flexion 3+/5 2+/5  Hip extension    Hip abduction    Hip adduction    Hip internal rotation    Hip external rotation    Knee flexion    Knee extension 4-/5 3/5  Ankle dorsiflexion 4/5 3/5  Ankle  plantarflexion    Ankle inversion    Ankle eversion     (Blank rows = not tested)  BED MOBILITY:  Sit to supine Complete Independence Supine to sit Complete Independence Rolling to Right Complete Independence Rolling to Left Complete Independence *Pain with rolling  TRANSFERS: Assistive device utilized: Single point cane  Sit to stand: SBA Stand to sit: SBA Chair to  chair: SBA  GAIT: Gait pattern: step through pattern, decreased arm swing- Left, decreased step length- Left, decreased stride length, and narrow BOS Distance walked: various clinic distances Assistive device utilized: Single point cane Level of assistance: SBA  FUNCTIONAL TESTS:  5 times sit to stand: 27.91 seconds w/ BUE support 10 meter walk test: 14.82 seconds w/ SPC = 0.67 m/sec OR 2.23 ft/sec Berg Balance Scale: To be assessed.  PATIENT SURVEYS:  None completed due to time.  TODAY'S TREATMENT:                                                                                                                              DATE: 01/06/2023 -SciFit using BUE/BLE in single peak mode on level 7.0 x 8 minutes for dynamic warmup and global strengthening.  RPE 5/10 midway through task. -Standing normal BOS on airex eyes open > eyes closed 4 rounds x1 minute each no UE support SBA > marching no UE support x2 minutes -Standing on A/P tilt board progressing to no UE support CGA eyes open > marching x2 minutes > alternating toe taps to 8" midline cone -Foam beam tandem walking forwards and backwards w/ bilateral 3lb ankle weights 3x8 ft each direction no UE support CGA, good ankle and hip strategy noted with LOB -8" hurdles 8 rounds progressing to SBA w/ bilateral 3lb ankle weights > laterally x2 rounds w/ ankle weights CGA, more difficulty with lateral weight shift in SLS  Trigger Point Dry-Needling  Treatment instructions: Expect mild to moderate muscle soreness. S/S of pneumothorax if dry needled over a lung field, and to  seek immediate medical attention should they occur. Patient verbalized understanding of these instructions and education.  Patient Consent Given: Yes Education handout provided: Yes Muscles treated: L upper trap, L levator scap Treatment response/outcome: deep ache/muscle cramp; muscle twitch detected  Trigger Point Dry Needling  What is Trigger Point Dry Needling (DN)? DN is a physical therapy technique used to treat muscle pain and dysfunction. Specifically, DN helps deactivate muscle trigger points (muscle knots).  A thin filiform needle is used to penetrate the skin and stimulate the underlying trigger point. The goal is for a local twitch response (LTR) to occur and for the trigger point to relax. No medication of any kind is injected during the procedure.   What Does Trigger Point Dry Needling Feel Like?  The procedure feels different for each individual patient. Some patients report that they do not actually feel the needle enter the skin and overall the process is not painful. Very mild bleeding may occur. However, many patients feel a deep cramping in the muscle in which the needle was inserted. This is the local twitch response.   How Will I feel after the treatment? Soreness is normal, and the onset of soreness may not occur for a few hours. Typically this soreness does not last longer than two days.  Bruising is uncommon, however; ice can be used to  decrease any possible bruising.  In rare cases feeling tired or nauseous after the treatment is normal. In addition, your symptoms may get worse before they get better, this period will typically not last longer than 24 hours.   What Can I do After My Treatment? Increase your hydration by drinking more water for the next 24 hours. You may place ice or heat on the areas treated that have become sore, however, do not use heat on inflamed or bruised areas. Heat often brings more relief post needling. You can continue your regular  activities, but vigorous activity is not recommended initially after the treatment for 24 hours. DN is best combined with other physical therapy such as strengthening, stretching, and other therapies.     PATIENT EDUCATION: Education details:  Continue HEP and regular walking.  Continue using cane for safety as able - rollator prn.  TPDN (see above) Person educated: Patient Education method: Explanation Education comprehension: verbalized understanding and needs further education  HOME EXERCISE PROGRAM: Access Code: [email or web address] URL: [email or web address] Date: 11/25/2022 Prepared by: Camille Bal  Exercises - Side Stepping with Resistance at Thighs and Counter Support  - 1 x daily - 5 x weekly - 3 sets - 10 reps - Standing Tandem Balance with Counter Support  - 1 x daily - 5 x weekly - 1 sets - 4 reps - 30 seconds hold - Sit to Stand with Resistance Around Legs  - 1 x daily - 5 x weekly - 1-2 sets - 10 reps - Heel Raises with Counter Support  - 1 x daily - 5 x weekly - 2 sets - 12 reps - Supine Chin Tuck  - 1 x daily - 5 x weekly - 3 sets - 10 reps - Forward Backward Monster Walk with Band at Thighs and Counter Support  - 1 x daily - 5 x weekly - 3 sets - 10 reps - Supine Cervical Retraction with Towel  - 1 x daily - 5 x weekly - 1-2 sets - 10 reps - Supine Bridge  - 1 x daily - 5 x weekly - 1 sets - 15 reps - Scapular retraction with resistance  - 1 x daily - 5 x weekly - 1-2 sets - 15 reps -Cervical AROM in all planes as tolerated  You Can Walk For A Certain Length Of Time Each Day (use rollator for safety and have wife nearby for supervision)                          Walk 4-5 minutes 2-3 times per day.             Increase 2-3  minutes every week.             Work up to 20 minutes continuously once per day.               Example:                         Day 1-2           4-5 minutes     3 times per day                         Day 7-8           10-12 minutes 2-3  times per day  Day 13-14       20-22 minutes 1-2 times per day  NEW GOALS: Goals reviewed with patient? Yes  SHORT TERM GOALS: Target date: 12/18/2022  Pt will be independent and compliant with advanced strength and balance HEP in order to maintain functional progress and improve mobility. Baseline:  Updated and patient compliant and IND (9/26) Goal status:  MET  2.  Pt will decrease 5xSTS to </=13 seconds w/o UE support in order to demonstrate decreased risk for falls and improved functional bilateral LE strength and power. Baseline: 15.91 sec w/ bilateral hands on knees (8/28); 13.91 seconds w/ light BUE support (9/26) Goal status: PARTIALLY MET  3.  Pt will demonstrate a gait speed of >/=0.98 m/sec in order to decrease risk for falls using rollator. Baseline: 0.88 m/sec w/ rollator (8/28); 1.05 m/sec w/ rollator  (9/26) Goal status: MET  LONG TERM GOALS: Target date: 01/15/2023  1.  Pt will demonstrate a gait speed of >/=1.08 m/sec in order to decrease risk for falls using no more than a cane. Baseline: 0.88 m/sec w/ rollator (8/28) Goal status: INITIAL  2.  Pt will increase BERG balance score to >/=52/56 to demonstrate improved static balance. Baseline: 48/56 (8/28) Goal status: REVISED  3.  Pt will ambulate >/=800 feet at no more than modI level of assist over unlevel surfaces and grass to promote household and community access. Baseline: inside clinic level distance w/ SPC SBA; 500 ft w/ SPC SBA unlevel sidewalk (9/4) Goal status: INITIAL  4.  Patient will report average 7 day pain of </=5/10 at rest in order to demonstrate improved quality of life. Baseline: 7/10 left UE recent week average (8/28) Goal status: ONGOING  ASSESSMENT:  CLINICAL IMPRESSION: Focus of skilled session today on addressing static stability and dynamic SLS.  He has more difficulty with lateral weight shift in SLS than anterior and posterior weight shifting.  TPDN performed  to address pain/tightness in his L UT region, can better assess response next session. He continues to benefit from skilled PT to further address pain management of left shoulder in combination with OT as done today and general stability and strength to progress towards functional level as able.  OBJECTIVE IMPAIRMENTS: Abnormal gait, decreased activity tolerance, decreased balance, decreased cognition, decreased coordination, decreased endurance, decreased knowledge of condition, difficulty walking, decreased ROM, decreased strength, impaired sensation, impaired tone, improper body mechanics, postural dysfunction, and pain.   ACTIVITY LIMITATIONS: carrying, lifting, bending, standing, squatting, stairs, transfers, bed mobility, bathing, reach over head, and locomotion level  PARTICIPATION LIMITATIONS: meal prep, cleaning, laundry, medication management, driving, shopping, community activity, occupation, and yard work  PERSONAL FACTORS: Age, Fitness, Past/current experiences, Transportation, and 1 comorbidity: chronic Hep C  are also affecting patient's functional outcome.   REHAB POTENTIAL: Good  CLINICAL DECISION MAKING: Evolving/moderate complexity  EVALUATION COMPLEXITY: Moderate  PLAN:  PT FREQUENCY: 1-2x/week + 1x/wk (re-cert 4/13)  PT DURATION: 8 weeks + 8 weeks (re-cert 2/44)  PLANNED INTERVENTIONS: Therapeutic exercises, Therapeutic activity, Neuromuscular re-education, Balance training, Gait training, Patient/Family education, Self Care, Stair training, Vestibular training, Orthotic/Fit training, DME instructions, Dry Needling, scar mobilization, Manual therapy, and Re-evaluation  PLAN FOR NEXT SESSION:   Endurance.  LLE NMR-leg press (try single), unsupported static balance, periscapular strength, IASTM for left shoulder again, birddogs, TPDN!  Lateral step taps/ups/lateral blaze pods taps varied surfaces  Sadie Haber, PT, DPT Peter Congo, PT, DPT, CSRS  01/06/2023,  1:59 PM

## 2023-01-06 NOTE — Therapy (Signed)
OUTPATIENT OCCUPATIONAL THERAPY NEURO TREATMENT  Patient Name: Kenneth Flores MRN: [id number] DOB:Aug 04, 1960, 62 y.o., male Today's Date: 01/06/2023  PCP: Kenneth Flores, [place] REFERRING PROVIDER: Milinda Antis, PA-C  END OF SESSION:  OT End of Session - 01/06/23 1407     Visit Number 14    Number of Visits 16   + Evaluation   Date for OT Re-Evaluation 01/21/23    Authorization Type [employer] 2024 VL: 60 (PT/OT/ST)    OT Start Time 1406    OT Stop Time 1445    OT Time Calculation (min) 39 min    Equipment Utilized During Treatment red theraband    Activity Tolerance Patient tolerated treatment well    Behavior During Therapy WFL for tasks assessed/performed             Past Medical History:  Diagnosis Date   Hepatitis C    Past Surgical History:  Procedure Laterality Date   ANTERIOR CERVICAL DECOMP/DISCECTOMY FUSION N/A 08/21/2022   Procedure: ANTERIOR CERVICAL DECOMPRESSION/DISCECTOMY FUSION CERVICAL THREE-FOUR, CERVICAL FOUR-FIVE, CERVICAL FIVE-SIX;  Surgeon: Kenneth Sicks, MD;  Location: [facility] OR;  Service: Neurosurgery;  Laterality: N/A;   PELVIC FRACTURE SURGERY Right    Patient Active Problem List   Diagnosis Date Noted   Neurogenic bowel 10/16/2022   Spasticity 10/16/2022   Abnormality of gait 10/16/2022   Acquired left foot drop 10/16/2022   Chronic pain due to trauma 10/16/2022   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) 09/09/2022   Adjustment disorder with depressed mood 08/31/2022   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  He continues to have discomfort and tingling in L arm especially which is also affected by cold temperatures this week.  Pt accompanied by: self   PERTINENT HISTORY:   Hx of chronic Hep C, status post micturition related syncope from vasovagal episode at home on 08/19/2022 resulting in fall with blow to head. Imaging revealed evidence of significant multilevel cervical degenerative disease with associated disc space collapse and associated spondylosis. Patient with critical spinal stenosis with severe cord compression and cord signal change at C3-4. Severe stenosis at C4-5 and C5-6 without marked cord signal abnormality at these levels. There is some changes posteriorly from C3-C6 with some worrisome findings for some posterior ligamentous injury. There was no evidence of fracture on his CT scan. There is no evidence of malalignment. Neurosurgery consulted and admitted the patient for IV steroids and pain control. On 5/30, he was taken to the OR and underwent C3-4, C4-5, C5-6 anterior cervical discectomy with interbody fusion utilizing interbody cages, local harvested autograft, and anterior plate instrumentation. Motor function improved.   In patient Rehab 08/27/2022 - 09/16/2022   Discharge Diagnoses:  Principal Problem:   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-6 level Kenneth Flores) Active Problems:   Adjustment disorder with depressed mood Active problems: Functional deficits secondary to incomplete spinal cord lesion at C5-C7 level Constipation History of chronic hepatitis C Neurogenic bladder with urinary retention Neurogenic bowel Hypertension  PRECAUTIONS: Cervical and Other: soft collar when moving/walking but takes it off at rest and when sleeping 12/23/22 - No collar anymore. WEIGHT BEARING RESTRICTIONS: No  PAIN:  Are you having  pain? Yes: NPRS scale: 7/10 Pain location: L arm, shoulder and across shoulder and lower back today Pain description: nerve pain in shoulders/arm but aching in back Aggravating factors: Overworking it, doing too much Relieving factors: rest/relaxing, oral medication 4x/day, heating pad sometimes, BioFreeze   FALLS: Has patient fallen in last 6 months? Yes. Number of falls Previously 2 related to same vasovagal issues - the first fall without injury was a couple of weeks prior to the second fall when he hit his head on the sink   Additional fall around 10/06/22 when his legs did a split while trying to walk at home  LIVING ENVIRONMENT: Lives with: lives with their spouse - Kenneth Flores Lives in: House/apartment Stairs: Yes: Internal: 14 steps; on right going up, on left going up, and only half way up on the R side; wife walks with him upstairs to the bedrooms Has following equipment at home: Single point cane, Shower bench, and Grab bars  PLOF: Independent, driving, worked as Administrator (x40 hours/week)  PATIENT GOALS: Get my L arm better, tie shoelaces tighter so they stay tied  OBJECTIVE:   HAND DOMINANCE: Right  ADLs: Overall ADLs: Min to mod assist Transfers/ambulation related to ADLs: SBA with cane Eating: Needs help to cut his food Grooming: He tries to shave himself but gets help for the things he can't reach/get to on his own. UB Dressing: He can dress himself with simple clothes but needs extra time but needs help with fasteners LB Dressing: Needs help to get TED hose on and tie shoelaces Toileting: MI but wife is still monitoring him Bathing: Wife assists with his back and is present during showers Tub Shower transfers: Tub transfer bench - wife is there for extra support due to him being wobbly and has been trying to step into the tub with grab bars rather than sitting on the bench all the time Equipment: Transfer tub bench and Grab bars  IADLs: Shopping: Wife is performing  shopping. Light housekeeping: Dependent on family ie) Not able to safely or effectively help take out the trash, mow the lawn etc. Meal Prep: Wife performs this.  He did help with this a little previously. Community mobility: Uses cane with supervision Medication management: No meds previously but now 4x/day.  Using pill box and patient did sort them himself the last time but his wife is checking to make sure they are accurate and calls him to remind him to take his medication as needed (ie if she is working Catering manager). Financial management: Wife is helping more than she needed to previously Handwriting: 50% legible - Patient reports his current writing is not as good as before (about 50% compared to before) but it has improved significantly from when he had to mark/sign with an "X" only.    Patient is never home alone (if his wife is at work, his stepson will be there).  He stays on the bottom level of the home during the daytime and is off the narcotic in the daytime.  He uses his cane in the house  MOBILITY STATUS: Needs Assist: supervision for safety  POSTURE COMMENTS:  forward head Sitting balance: WFL  ACTIVITY TOLERANCE: Activity tolerance: Decreased.  Patient reports falling asleep/napping daily at this time (combo of medication etc)  FUNCTIONAL OUTCOME MEASURES: Quick Dash: 88.6  (  see eval report for complete list of limitations).  11/17/22 Quick Dash: 43.2  01/06/23 Quick Dash: 36.4     UPPER EXTREMITY ROM:    Active ROM Right eval Left eval  Shoulder flexion 100 90  Shoulder abduction 90 75  Shoulder adduction    Shoulder extension Limited end range   Shoulder internal rotation   Shoulder external rotation    Elbow flexion Limited end range  Elbow extension Decreased end range (L side spasmed with stretch  Wrist flexion    Wrist extension    Wrist ulnar deviation    Wrist radial deviation    Wrist pronation    Wrist supination    Digital extension Decreased end  range and slow to open   (Blank rows = not formally tested at eval)  UPPER EXTREMITY MMT:     MMT Right eval Left eval  Shoulder flexion 3- 3-  Shoulder abduction    Shoulder adduction    Shoulder extension    Shoulder internal rotation    Shoulder external rotation    Middle trapezius    Lower trapezius    Elbow flexion 3 3-  Elbow extension    Wrist flexion    Wrist extension    Wrist ulnar deviation    Wrist radial deviation    Wrist pronation    Wrist supination    (Blank rows = not formally tested at eval)  HAND FUNCTION: Grip strength: Right: 21.1, 18.2, 20.0 lbs; Left: 8.5, 5.7, 7.4  lbs Evaluation: Right Avg 19.8 lbs Left Avg 7.2 lbs  11/11/22: Right 29.3, 24.6, 25.3 Left 14.3, 12.1, 12.1   COORDINATION: 9 Hole Peg test: Right: 45.22  sec; Left: 1:40.28  sec   11/11/22 Right: 32.29 sec Left: 45.96 sec  Box and Blocks:  Right 41 blocks, Left 31 blocks  11/11/22 Right: 41 blocks, Left: 35 blocks  SENSATION: Not tested  EDEMA: None noted in UEs  MUSCLE TONE: Patient has some stiffness with ROM  COGNITION: Overall cognitive status: Within functional limits for tasks assessed BIMS from Flores (08/2022) 15/15  VISION: Subjective report: No changes since accident Baseline vision: Bifocals Visual history: NA  VISION ASSESSMENT: Not tested  Patient has no changes or difficulty with any activities due to vision.  PERCEPTION: Not tested  PRAXIS: Not tested  EVALUATION OBSERVATIONS: Patient seen following physical therapy services this afternoon.  He arrived ambulating with a single-point cane.  Patient is accompanied by his wife.  Patient is soft-spoken and noted to refer to his wife for answers throughout session.  He has noted to have limitations in bilateral upper extremity range of motion left greater than right with limited success with overhead reaching and extended time for fine motor tasks with both sides but particularly left upper  extremity.   TODAY'S TREATMENT:                                                                                                                               -  Therapeutic exercises completed for duration as noted below including: OT reviewed and progressed RUE and LUE red Thera-Band HEP including horizontal abd/add, bicep curl, and tricep extension to promote strengthening of affected extremity and overall endurance as noted in pt instructions.  Progressed activity to standing for core strength, upright trunk and mod-max cues given to realax shoulders during motions.  - Self Care - Reviewed posture recommendations including use of small lumbar roll (towel or small blanket) to promote upright trunk as well as UE ROM to improve lumbar lordosis which is highly impacted by poor posture and muscle spasms in back.   PATIENT EDUCATION: Education details: posture and reviewed theraband exercises Person educated: Patient Education method: Explanation, Demonstration, and Verbal cues Education comprehension: verbalized understanding, returned demonstration, verbal cues required, and needs further education  HOME EXERCISE PROGRAM: 10/14/22: Putty Exercises - Access Code: [facility] 10/21/22: Added Table Top slide to HEP - Same Access code 10/23/2022: coordination HEP 10/28/22: Desensitization Program  11/06/2022: dowel HEP 12/31/2022: Red theraband and towel shoulder stretch  GOALS: Goals reviewed with patient? Yes   SHORT TERM GOALS: Target date: 12/22/22  Patient will improve overhead reaching > 100 degrees for B shoulders to assist with ADLs. Baseline: R 100* L 90* Goal status: IN PROGRESS 11/18/22 - still limited in sitting without assistance but > 90 degrees with pulley or supine in bed, during table and wall slides  2.  Patient will demonstrate at least 30 lbs RUE and 15 lbs LUE grip strength as needed to open containers and carry lightweight objects without dropping them.  Baseline: Right Avg 19.8  lbs Left Avg 7.2 lbs Goal status: MET 11/11/22: Right 29.3, 24.6, 25.3 Left 14.3, 12.1, 12.1 Average: Right: 26.4 lbs Left: 12.8 lbs 12/23/22: Right 30.4, 37.0 43.6 Left 19.6, 20.0, 22.2 - Makes L side tingly Average Right: 37.0 Left 20.6 lbs  3.  Patient will complete nine-hole peg with use of RUE in 35 seconds or less and LUE 75 seconds or less.  Baseline: 9 Hole Peg test: Right: 45.22  sec; Left: 1:40.28  sec Goal status: MET  11/11/22 Right: 32.29 sec Left: 45.96 sec   4.  Patient will demonstrate UE strengthening HEP (putty/theraband etc) with 25% verbal cues or less for proper execution. Baseline: Has various materials from Flores. Goal status: MET  5.  Patient will demonstrate good digital A/AROM HEP to minimize stiffness in hands (esp L) which impacts coordination ie) in hand manipulation of small objects. Baseline: Stiff digital extension with decreased end range and difficulty with managing pegs for 9 hole peg test ie) dropped items etc.  Goal status: MET  LONG TERM GOALS: Target date: 01/21/23  Patient will demonstrate UE strength and coordination to cut various foods using appropriate AE if necessary. Baseline: Unable to cut food/Assisted by family Goal status: MET  2.  Patient will demonstrate UE pinch strength and coordination to tie shoelaces tightly. Baseline: Shoelaces frequently unfasten Goal status: MET  3.  Patient will demonstrate at least 40 lbs RUE and 20 lbs LUE grip strength as needed to open containers and carry lightweight objects.  Baseline: Right Avg 19.8 lbs Left Avg 7.2 lbs Goal status: IN PROGRESS 11/11/22: Right 29.3, 24.6, 25.3 Left 14.3, 12.1, 12.1 Average: Right: 26.4 lbs Left: 12.8 lbs 12/23/22: Right 30.4, 37.0 43.6 Left 19.6, 20.0, 22.2 - Makes L side tingly Average Right: 37.0 Left 20.6 lbs  4.  Patient will complete nine-hole peg with use of RUE in 30 seconds or less and LUE  40 seconds or less.  Baseline: 9 Hole Peg test: Right: 45.22   sec; Left: 1:40.28  sec Goal status: Revised 11/11/22 IN PROGRESS  11/11/22 Right: 32.29 sec Left: 45.96 sec 12/23/22 Right: 34.4 and 31.81 sec Left 47.6 and 40.66 sec  5.  Patient will be able to complete simple food prep activities with good activity tolerance > 15 minutes and safety (ie. Decreased fall risk and implementation of safety precautions). Baseline: Family does all meal prep Goal status: 11/11/22 Discontinued - wife cooks and prepares food for him to warm in microwave (previously did so also)  6. Patient will demonstrate improvement with quick Dash score (reporting <40 % disability or less) indicating improved functional use of affected extremity.   Baseline: 88.6% (10/11 categories as Severe/Unable to perform.  Goal Status: Revised 11/18/22 Quick Dash 43.6  ASSESSMENT:  CLINICAL IMPRESSION: Pt demonstrated good tolerance to theraband HEP for strengthening, core stability and balance activities. Will continue to work toward improving B shoulder flex AROM which continues to elicit shocks down his arm above 90 degrees.    PERFORMANCE DEFICITS: in functional skills including ADLs, IADLs, coordination, dexterity, proprioception, sensation, tone, ROM, strength, pain, muscle spasms, flexibility, Fine motor control, Gross motor control, mobility, balance, endurance, continence, decreased knowledge of use of DME, and UE functional use, cognitive skills including energy/drive, safety awareness, and thought, and psychosocial skills including coping strategies and routines and behaviors.   IMPAIRMENTS: are limiting patient from ADLs, IADLs, rest and sleep, work, leisure, and social participation.   CO-MORBIDITIES: may have co-morbidities  that affects occupational performance. Patient will benefit from skilled OT to address above impairments and improve overall function.  REHAB POTENTIAL: Good   PLAN:  OT FREQUENCY: 1x/week  OT DURATION: 6 weeks  PLANNED INTERVENTIONS: self care/ADL  training, therapeutic exercise, therapeutic activity, neuromuscular re-education, manual therapy, balance training, functional mobility training, splinting, fluidotherapy, patient/family education, energy conservation, coping strategies training, and DME and/or AE instructions  RECOMMENDED OTHER SERVICES: Patient had outpt PT evaluation today also.  CONSULTED AND AGREED WITH PLAN OF CARE: Patient and family member/caregiver  PLAN FOR NEXT SESSIONs:   Review and progress shoulder ROM (prone and dowel HEP and shoulder strengthening and ROM and progress from table top slides to wall slides  Review theraband HEP for UE strengthening. Progress Coordination and sensory stimulation activities for HEP Pt may need folder/binder to keep up with information.    Victorino Sparrow, OT 01/06/2023, 7:21 PM

## 2023-01-13 ENCOUNTER — Encounter: Payer: Self-pay | Admitting: Physical Therapy

## 2023-01-13 ENCOUNTER — Ambulatory Visit: Payer: PRIVATE HEALTH INSURANCE | Admitting: Physical Therapy

## 2023-01-13 ENCOUNTER — Ambulatory Visit: Payer: PRIVATE HEALTH INSURANCE | Admitting: Occupational Therapy

## 2023-01-13 DIAGNOSIS — M6281 Muscle weakness (generalized): Secondary | ICD-10-CM

## 2023-01-13 DIAGNOSIS — R29898 Other symptoms and signs involving the musculoskeletal system: Secondary | ICD-10-CM

## 2023-01-13 DIAGNOSIS — R2689 Other abnormalities of gait and mobility: Secondary | ICD-10-CM

## 2023-01-13 DIAGNOSIS — R208 Other disturbances of skin sensation: Secondary | ICD-10-CM

## 2023-01-13 DIAGNOSIS — R278 Other lack of coordination: Secondary | ICD-10-CM

## 2023-01-13 DIAGNOSIS — M79602 Pain in left arm: Secondary | ICD-10-CM

## 2023-01-13 DIAGNOSIS — R2681 Unsteadiness on feet: Secondary | ICD-10-CM

## 2023-01-13 DIAGNOSIS — M62838 Other muscle spasm: Secondary | ICD-10-CM

## 2023-01-13 NOTE — Therapy (Signed)
OUTPATIENT OCCUPATIONAL THERAPY NEURO TREATMENT  Patient Name: Kenneth Flores MRN: [id number] DOB:12/29/1960, 62 y.o., male Today's Date: 01/13/2023  PCP: Norm Salt, [place] REFERRING PROVIDER: Milinda Antis, PA-C  END OF SESSION:  OT End of Session - 01/13/23 1323     Visit Number 15    Number of Visits 16   + Evaluation   Date for OT Re-Evaluation 01/21/23    Authorization Type [employer] 2024 VL: 60 (PT/OT/ST)    OT Start Time 1322    OT Stop Time 1400    OT Time Calculation (min) 38 min    Equipment Utilized During Treatment UBE, heat pack    Activity Tolerance Patient tolerated treatment well    Behavior During Therapy WFL for tasks assessed/performed             Past Medical History:  Diagnosis Date   Hepatitis C    Past Surgical History:  Procedure Laterality Date   ANTERIOR CERVICAL DECOMP/DISCECTOMY FUSION N/A 08/21/2022   Procedure: ANTERIOR CERVICAL DECOMPRESSION/DISCECTOMY FUSION CERVICAL THREE-FOUR, CERVICAL FOUR-FIVE, CERVICAL FIVE-SIX;  Surgeon: Julio Sicks, MD;  Location: [facility] OR;  Service: Neurosurgery;  Laterality: N/A;   PELVIC FRACTURE SURGERY Right    Patient Active Problem List   Diagnosis Date Noted   Neurogenic bowel 10/16/2022   Spasticity 10/16/2022   Abnormality of gait 10/16/2022   Acquired left foot drop 10/16/2022   Chronic pain due to trauma 10/16/2022   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) 09/09/2022   Adjustment disorder with depressed mood 08/31/2022   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  He continues to have discomfort and tingling in L arm especially which is also affected by cold temperatures this week.  Pt accompanied by: self   PERTINENT HISTORY:   Hx of chronic Hep C, status post micturition related syncope from vasovagal episode at home on 08/19/2022 resulting in fall with blow to head. Imaging revealed evidence of significant multilevel cervical degenerative disease with associated disc space collapse and associated spondylosis. Patient with critical spinal stenosis with severe cord compression and cord signal change at C3-4. Severe stenosis at C4-5 and C5-6 without marked cord signal abnormality at these levels. There is some changes posteriorly from C3-C6 with some worrisome findings for some posterior ligamentous injury. There was no evidence of fracture on his CT scan. There is no evidence of malalignment. Neurosurgery consulted and admitted the patient for IV steroids and pain control. On 5/30, he was taken to the OR and underwent C3-4, C4-5, C5-6 anterior cervical discectomy with interbody fusion utilizing interbody cages, local harvested autograft, and anterior plate instrumentation. Motor function improved.   In patient Rehab 08/27/2022 - 09/16/2022   Discharge Diagnoses:  Principal Problem:   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-6 level [facility]) Active Problems:   Adjustment disorder with depressed mood Active problems: Functional deficits secondary to incomplete spinal cord lesion at C5-C7 level Constipation History of chronic hepatitis C Neurogenic bladder with urinary retention Neurogenic bowel Hypertension  PRECAUTIONS: Cervical and Other: soft collar when moving/walking but takes it off at rest and when sleeping 12/23/22 - No collar anymore. WEIGHT BEARING RESTRICTIONS: No  PAIN:  Are you having  pain? Yes: NPRS scale: 7/10 Pain location: L arm, shoulder and across shoulder and lower back today Pain description: nerve pain in shoulders/arm but aching in back Aggravating factors: Overworking it, doing too much Relieving factors: rest/relaxing, oral medication 4x/day, heating pad sometimes, BioFreeze   FALLS: Has patient fallen in last 6 months? Yes. Number of falls Previously 2 related to same vasovagal issues - the first fall without injury was a couple of weeks prior to the second fall when he hit his head on the sink   Additional fall around 10/06/22 when his legs did a split while trying to walk at home  LIVING ENVIRONMENT: Lives with: lives with their spouse - [name] Lives in: House/apartment Stairs: Yes: Internal: 14 steps; on right going up, on left going up, and only half way up on the R side; wife walks with him upstairs to the bedrooms Has following equipment at home: Single point cane, Shower bench, and Grab bars  PLOF: Independent, driving, worked as Administrator (x40 hours/week)  PATIENT GOALS: Get my L arm better, tie shoelaces tighter so they stay tied  OBJECTIVE:   HAND DOMINANCE: Right  ADLs: Overall ADLs: Min to mod assist Transfers/ambulation related to ADLs: SBA with cane Eating: Needs help to cut his food Grooming: He tries to shave himself but gets help for the things he can't reach/get to on his own. UB Dressing: He can dress himself with simple clothes but needs extra time but needs help with fasteners LB Dressing: Needs help to get TED hose on and tie shoelaces Toileting: MI but wife is still monitoring him Bathing: Wife assists with his back and is present during showers Tub Shower transfers: Tub transfer bench - wife is there for extra support due to him being wobbly and has been trying to step into the tub with grab bars rather than sitting on the bench all the time Equipment: Transfer tub bench and Grab bars  IADLs: Shopping: Wife is performing  shopping. Light housekeeping: Dependent on family ie) Not able to safely or effectively help take out the trash, mow the lawn etc. Meal Prep: Wife performs this.  He did help with this a little previously. Community mobility: Uses cane with supervision Medication management: No meds previously but now 4x/day.  Using pill box and patient did sort them himself the last time but his wife is checking to make sure they are accurate and calls him to remind him to take his medication as needed (ie if she is working Catering manager). Financial management: Wife is helping more than she needed to previously Handwriting: 50% legible - Patient reports his current writing is not as good as before (about 50% compared to before) but it has improved significantly from when he had to mark/sign with an "X" only.    Patient is never home alone (if his wife is at work, his stepson will be there).  He stays on the bottom level of the home during the daytime and is off the narcotic in the daytime.  He uses his cane in the house  MOBILITY STATUS: Needs Assist: supervision for safety  POSTURE COMMENTS:  forward head Sitting balance: WFL  ACTIVITY TOLERANCE: Activity tolerance: Decreased.  Patient reports falling asleep/napping daily at this time (combo of medication etc)  FUNCTIONAL OUTCOME MEASURES: Quick Dash: 88.6  (  see eval report for complete list of limitations).  11/17/22 Quick Dash: 43.2  01/06/23 Quick Dash: 36.4     UPPER EXTREMITY ROM:    Active ROM Right eval Left eval  Shoulder flexion 100 90  Shoulder abduction 90 75  Shoulder adduction    Shoulder extension Limited end range   Shoulder internal rotation   Shoulder external rotation    Elbow flexion Limited end range  Elbow extension Decreased end range (L side spasmed with stretch  Wrist flexion    Wrist extension    Wrist ulnar deviation    Wrist radial deviation    Wrist pronation    Wrist supination    Digital extension Decreased end  range and slow to open   (Blank rows = not formally tested at eval)  UPPER EXTREMITY MMT:     MMT Right eval Left eval  Shoulder flexion 3- 3-  Shoulder abduction    Shoulder adduction    Shoulder extension    Shoulder internal rotation    Shoulder external rotation    Middle trapezius    Lower trapezius    Elbow flexion 3 3-  Elbow extension    Wrist flexion    Wrist extension    Wrist ulnar deviation    Wrist radial deviation    Wrist pronation    Wrist supination    (Blank rows = not formally tested at eval)  HAND FUNCTION: Grip strength: Right: 21.1, 18.2, 20.0 lbs; Left: 8.5, 5.7, 7.4  lbs Evaluation: Right Avg 19.8 lbs Left Avg 7.2 lbs  11/11/22: Right 29.3, 24.6, 25.3 Left 14.3, 12.1, 12.1   COORDINATION: 9 Hole Peg test: Right: 45.22  sec; Left: 1:40.28  sec   11/11/22 Right: 32.29 sec Left: 45.96 sec  Box and Blocks:  Right 41 blocks, Left 31 blocks  11/11/22 Right: 41 blocks, Left: 35 blocks  SENSATION: Not tested  EDEMA: None noted in UEs  MUSCLE TONE: Patient has some stiffness with ROM  COGNITION: Overall cognitive status: Within functional limits for tasks assessed BIMS from hospital (08/2022) 15/15  VISION: Subjective report: No changes since accident Baseline vision: Bifocals Visual history: NA  VISION ASSESSMENT: Not tested  Patient has no changes or difficulty with any activities due to vision.  PERCEPTION: Not tested  PRAXIS: Not tested  EVALUATION OBSERVATIONS: Patient seen following physical therapy services this afternoon.  He arrived ambulating with a single-point cane.  Patient is accompanied by his wife.  Patient is soft-spoken and noted to refer to his wife for answers throughout session.  He has noted to have limitations in bilateral upper extremity range of motion left greater than right with limited success with overhead reaching and extended time for fine motor tasks with both sides but particularly left upper  extremity.   TODAY'S TREATMENT:                                                                                                                               -  Therapeutic exercises completed for duration as noted below including: OT reviewed RUE and LUE red HEPs with pt able to recall some and assisted with other OT TE ideas provided in previous sessions including: - putty - coordination - dowel - theraband - desensitization activities And PT HEP activities: - walking - leg exercises - neck exercises - stairs ALL HEP ideas combined to calendar for home carryover as DC date is upcoming.  Worked on shoulder ROM with wall slides for ROM > 90 degrees due to difficulty with AROM against gravity but increased ease with wall support.   PATIENT EDUCATION: Education details: HEP list, reviewed wall slides Person educated: Patient Education method: Explanation, Demonstration, and Verbal cues Education comprehension: verbalized understanding, returned demonstration, verbal cues required, and needs further education  HOME EXERCISE PROGRAM: 10/14/22: Putty Exercises - Access Code: [facility] 10/21/22: Added Table Top slide to HEP - Same Access code 10/23/2022: coordination HEP 10/28/22: Desensitization Program  11/06/2022: dowel HEP 12/31/22: Red theraband and towel shoulder stretch 01/13/23: HEP list & wall slides  GOALS: Goals reviewed with patient? Yes   SHORT TERM GOALS: Target date: 12/22/22  Patient will improve overhead reaching > 100 degrees for B shoulders to assist with ADLs. Baseline: R 100* L 90* Goal status: IN PROGRESS 11/18/22 - still limited in sitting without assistance but > 90 degrees with pulley or supine in bed, during table and wall slides  2.  Patient will demonstrate at least 30 lbs RUE and 15 lbs LUE grip strength as needed to open containers and carry lightweight objects without dropping them.  Baseline: Right Avg 19.8 lbs Left Avg 7.2 lbs Goal status: MET 11/11/22:  Right 29.3, 24.6, 25.3 Left 14.3, 12.1, 12.1 Average: Right: 26.4 lbs Left: 12.8 lbs 12/23/22: Right 30.4, 37.0 43.6 Left 19.6, 20.0, 22.2 - Makes L side tingly Average Right: 37.0 Left 20.6 lbs  3.  Patient will complete nine-hole peg with use of RUE in 35 seconds or less and LUE 75 seconds or less.  Baseline: 9 Hole Peg test: Right: 45.22  sec; Left: 1:40.28  sec Goal status: MET  11/11/22 Right: 32.29 sec Left: 45.96 sec   4.  Patient will demonstrate UE strengthening HEP (putty/theraband etc) with 25% verbal cues or less for proper execution. Baseline: Has various materials from hospital. Goal status: MET  5.  Patient will demonstrate good digital A/AROM HEP to minimize stiffness in hands (esp L) which impacts coordination ie) in hand manipulation of small objects. Baseline: Stiff digital extension with decreased end range and difficulty with managing pegs for 9 hole peg test ie) dropped items etc.  Goal status: MET  LONG TERM GOALS: Target date: 01/21/23  Patient will demonstrate UE strength and coordination to cut various foods using appropriate AE if necessary. Baseline: Unable to cut food/Assisted by family Goal status: MET  2.  Patient will demonstrate UE pinch strength and coordination to tie shoelaces tightly. Baseline: Shoelaces frequently unfasten Goal status: MET  3.  Patient will demonstrate at least 40 lbs RUE and 20 lbs LUE grip strength as needed to open containers and carry lightweight objects.  Baseline: Right Avg 19.8 lbs Left Avg 7.2 lbs Goal status: IN PROGRESS 11/11/22: Right 29.3, 24.6, 25.3 Left 14.3, 12.1, 12.1 Average: Right: 26.4 lbs Left: 12.8 lbs 12/23/22: Right 30.4, 37.0 43.6 Left 19.6, 20.0, 22.2 - Makes L side tingly Average Right: 37.0 Left 20.6 lbs  4.  Patient will complete nine-hole peg with use of RUE in 30 seconds or less  and LUE 40 seconds or less.  Baseline: 9 Hole Peg test: Right: 45.22  sec; Left: 1:40.28  sec Goal status: Revised  11/11/22 IN PROGRESS  11/11/22 Right: 32.29 sec Left: 45.96 sec 12/23/22 Right: 34.4 and 31.81 sec Left 47.6 and 40.66 sec  5.  Patient will be able to complete simple food prep activities with good activity tolerance > 15 minutes and safety (ie. Decreased fall risk and implementation of safety precautions). Baseline: Family does all meal prep Goal status: 11/11/22 Discontinued - wife cooks and prepares food for him to warm in microwave (previously did so also)  6. Patient will demonstrate improvement with quick Dash score (reporting <40 % disability or less) indicating improved functional use of affected extremity.   Baseline: 88.6% (10/11 categories as Severe/Unable to perform.  Goal Status: Revised 11/18/22 Quick Dash 43.6  ASSESSMENT:  CLINICAL IMPRESSION: Pt demonstrated ongoing limitations with B UE shoulder AROM > 90 degrees against gravity.  He has a good variety of HEP ideas and calendar prepared for carryover of HEP for strengthening, core stability and balance activities. Pt encouraged to continue to work on improving B shoulder flex AAROM above 90 degrees as able.    PERFORMANCE DEFICITS: in functional skills including ADLs, IADLs, coordination, dexterity, proprioception, sensation, tone, ROM, strength, pain, muscle spasms, flexibility, Fine motor control, Gross motor control, mobility, balance, endurance, continence, decreased knowledge of use of DME, and UE functional use, cognitive skills including energy/drive, safety awareness, and thought, and psychosocial skills including coping strategies and routines and behaviors.   IMPAIRMENTS: are limiting patient from ADLs, IADLs, rest and sleep, work, leisure, and social participation.   CO-MORBIDITIES: may have co-morbidities  that affects occupational performance. Patient will benefit from skilled OT to address above impairments and improve overall function.  REHAB POTENTIAL: Good   PLAN:  OT FREQUENCY: 1x/week  OT DURATION: 6  weeks  PLANNED INTERVENTIONS: self care/ADL training, therapeutic exercise, therapeutic activity, neuromuscular re-education, manual therapy, balance training, functional mobility training, splinting, fluidotherapy, patient/family education, energy conservation, coping strategies training, and DME and/or AE instructions  RECOMMENDED OTHER SERVICES: Patient had outpt PT evaluation today also.  CONSULTED AND AGREED WITH PLAN OF CARE: Patient and family member/caregiver  PLAN FOR NEXT SESSIONs:   Review goals for Discharge  Review HEPS for shoulder ROM (prone and dowel HEP and shoulder strengthening and ROM and progress from table top slides to wall slides - HANDOUTS FOR WALL SLIDES AND shoulder ROM in bed Review theraband HEP for UE strengthening. Progress Coordination and sensory stimulation activities for HEP Pt may need folder/binder to keep up with information.    Victorino Sparrow, OT 01/13/2023, 5:32 PM

## 2023-01-13 NOTE — Therapy (Addendum)
OUTPATIENT PHYSICAL THERAPY NEURO TREATMENT   Patient Name: Kenneth Flores MRN: [id number] DOB:1960/09/11, 62 y.o., male Today's Date: 01/13/2023   PCP: Norm Salt, [place] REFERRING PROVIDER: Milinda Antis, PA-C  END OF SESSION:  PT End of Session - 01/13/23 1408     Visit Number 15    Number of Visits 25   17 + 8 at re-cert 9/56   Date for PT Re-Evaluation 01/29/23   pushed out due to delay in scheduling   Authorization Type [employer] ULTRA    PT Start Time 1403    PT Stop Time 1445    PT Time Calculation (min) 42 min    Equipment Utilized During Treatment Gait belt    Activity Tolerance Patient tolerated treatment well    Behavior During Therapy WFL for tasks assessed/performed              Past Medical History:  Diagnosis Date   Hepatitis C    Past Surgical History:  Procedure Laterality Date   ANTERIOR CERVICAL DECOMP/DISCECTOMY FUSION N/A 08/21/2022   Procedure: ANTERIOR CERVICAL DECOMPRESSION/DISCECTOMY FUSION CERVICAL THREE-FOUR, CERVICAL FOUR-FIVE, CERVICAL FIVE-SIX;  Surgeon: Julio Sicks, MD;  Location: [facility] OR;  Service: Neurosurgery;  Laterality: N/A;   PELVIC FRACTURE SURGERY Right    Patient Active Problem List   Diagnosis Date Noted   Neurogenic bowel 10/16/2022   Spasticity 10/16/2022   Abnormality of gait 10/16/2022   Acquired left foot drop 10/16/2022   Chronic pain due to trauma 10/16/2022   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) 09/09/2022   Adjustment disorder with depressed mood 08/31/2022   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) 08/27/2022   Cervical spinal cord injury (HCC) 08/19/2022   Chronic hepatitis C without hepatic coma (HCC) 09/08/2021   Thrombocytopenia (HCC) 09/08/2021    ONSET DATE: 08/20/2022 (surgery date)  REFERRING DIAG: 09/16/2022  THERAPY DIAG:  Muscle weakness (generalized)  Other lack of coordination  Pain in left arm  Other disturbances of skin sensation  Unsteadiness on feet  Other  abnormalities of gait and mobility  Other muscle spasm  Other symptoms and signs involving the musculoskeletal system  Rationale for Evaluation and Treatment: Rehabilitation  SUBJECTIVE:                                                                                                                                                                                             SUBJECTIVE STATEMENT: Patient received from OT with SPC.  [place] through [place] crowds with little concerns except for being bumped into - he took his cane, but felt  people didn't respect his space.  He denies falls or acute changes. Pt accompanied by: significant other-[name] (not present during session)  PERTINENT HISTORY: Chronic Hep C  Patient had syncopal episode in bathroom at home when urinating on 08/19/2022.  When he fell backwards he hit his head.  On 5/30, he was taken to the OR by Dr. Jordan Likes for C3-C6 anterior cervical discectomy and interbody fusion with anterior plate instrumentation secondary to multilevel cervical degenerative disease with disc space collapse, spondylosis and critical spinal stenosis with severe cord compression and signal change at level C3-4.  PAIN:  Are you having pain? Yes: NPRS scale: 7/10 Pain location: left arm and shoulder; BLE Pain description: tight shooting pains; burning in legs Aggravating factors: random, overworking the arm Relieving factors: pain medicines  PRECAUTIONS: Cervical and Fall-per Dr. Jordan Likes in-basket 7/24 pt has no restrictions.  WEIGHT BEARING RESTRICTIONS: No  FALLS: Has patient fallen in last 6 months? Yes. Number of falls 1-fall that caused surgical need  LIVING ENVIRONMENT: Lives with: lives with their spouse Lives in: House/apartment Stairs: Yes: Internal: 14 steps; on left going up and half of stairs have bilateral rails Has following equipment at home: Single point cane, Shower bench, and Grab bars  PLOF: Requires  assistive device for independence, Needs assistance with ADLs, Needs assistance with homemaking, Needs assistance with gait, and Needs assistance with transfers  PATIENT GOALS: "To try to get better"  OBJECTIVE:   DIAGNOSTIC FINDINGS:  MRI cervical spine 5/29: IMPRESSION: 1. Positive for evidence of both Ligamentous Injury, and Abnormal Cervical Spinal Cord in the setting of trauma: - Prevertebral edema or fluid from C1 through C6. Consider anterior longitudinal ligament injury, although no discrete ligamentous disruption is identified. - mild posterior interspinous ligamentous injury at C3-C4 eccentric to the right. - Abnormal spinal cord signal from C3 to the C5 cord level. While some of this most resembles chronic Myelomalacia (see #2), a component of Acute Cord Contusion cannot be excluded - especially in the left central cord at C3.   *Underlying bulky cervical spine degeneration with spinal stenosis AND spinal cord mass effect from C3-C4 (Moderate) through C5-C6 (mild). Associated moderate to severe degenerative neural foraminal stenosis at the bilateral C4, C5, and left C6 nerve levels.   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  COGNITION: Overall cognitive status:  Wife answers for pt primarily, pt appears to have delayed processing and requires repetition of questions.   SENSATION: Light touch: WFL and pt reporting tingling all the way down the left leg  COORDINATION: LE RAMS:  slow and deliberate BLE heel-to-shin:  limited due to functional weakness  EDEMA:  None noted in BLE, pt and wife deny swelling at home, he is wearing bilateral compression stockings for DVT prevention per wife-she states she will ask MD if these are necessary any longer.  MUSCLE TONE: Questionable left clonus  POSTURE: forward head-wearing soft cervical collar  LOWER EXTREMITY ROM/MMT:     Active  Right Eval Left Eval  Hip flexion 3+/5  2+/5  Hip extension    Hip abduction    Hip adduction    Hip internal rotation    Hip external rotation    Knee flexion    Knee extension 4-/5 3/5  Ankle dorsiflexion 4/5 3/5  Ankle plantarflexion    Ankle inversion    Ankle eversion     (Blank rows = not tested)  BED MOBILITY:  Sit to supine Complete Independence  Supine to sit Complete Independence Rolling to Right Complete Independence Rolling to Left Complete Independence *Pain with rolling  TRANSFERS: Assistive device utilized: Single point cane  Sit to stand: SBA Stand to sit: SBA Chair to chair: SBA  GAIT: Gait pattern: step through pattern, decreased arm swing- Left, decreased step length- Left, decreased stride length, and narrow BOS Distance walked: various clinic distances Assistive device utilized: Single point cane Level of assistance: SBA  FUNCTIONAL TESTS:  5 times sit to stand: 27.91 seconds w/ BUE support 10 meter walk test: 14.82 seconds w/ SPC = 0.67 m/sec OR 2.23 ft/sec Berg Balance Scale: To be assessed.  PATIENT SURVEYS:  None completed due to time.  TODAY'S TREATMENT:                                                                                                                              DATE: 01/13/2023 -Trigger Point Dry-Needling  Treatment instructions: Expect mild to moderate muscle soreness. S/S of pneumothorax if dry needled over a lung field, and to seek immediate medical attention should they occur. Patient verbalized understanding of these instructions and education.  Patient Consent Given: Yes Education handout provided: Yes Muscles treated: L upper trap, L and R thoracic and lumbar iliocostalis Treatment response/outcome: deep ache/muscle cramp; muscle twitch detected   -Discussed log provided by OT to include PT/OT HEP for tracking and compliance. -Discussion of likely discharge at upcoming session due to modest progress and likely need for episodic therapies in future to  maintain and progress as deficits change and pt heals over time.  Will assess LTGs to determine with certainty next visit.  PT remains open to re-cert if pt makes significant progress.  6 Blaze pods on random one color taps setting for improved dynamic SLS and reaction time.  Performed on 1 minute intervals with 30 second rest periods.  Pt requires SBA guarding. Round 1:  Lateral stepping to floor level to 6" step setup.  17 hits. Round 2:  " setup.  23 hits. Round 3:  " setup.  21 hits. Notable errors/deficits:  Single instance of crossover step to recover posterior LOB w/ SLS. -Reactive stepping forward lunge using blue theraband x16 alt LE > forward lunge w/ resistance and alternating basketball dribble (cues for active assist with LUE dribble) x20 alt LE > lateral lunge w/ bimanual basketball dribble x15 each side; CGA  PATIENT EDUCATION: Education details:  Continue HEP and regular walking.  Continue using cane for safety as able - rollator prn.  TPDN (see above).  Tentative plan for discharge and expectations at home to maintain progress. Person educated: Patient Education method: Explanation Education comprehension: verbalized understanding and needs further education  HOME EXERCISE PROGRAM: Access Code: [email or web address] URL: [email or web address] Date: 11/25/2022 Prepared by: Camille Bal  Exercises - Side Stepping with Resistance at Thighs and Counter Support  - 1 x daily - 5 x weekly - 3 sets - 10 reps - Standing Tandem Balance  with Counter Support  - 1 x daily - 5 x weekly - 1 sets - 4 reps - 30 seconds hold - Sit to Stand with Resistance Around Legs  - 1 x daily - 5 x weekly - 1-2 sets - 10 reps - Heel Raises with Counter Support  - 1 x daily - 5 x weekly - 2 sets - 12 reps - Supine Chin Tuck  - 1 x daily - 5 x weekly - 3 sets - 10 reps - Forward Backward Monster Walk with Band at Thighs and Counter Support  - 1 x daily - 5 x weekly - 3 sets - 10 reps - Supine  Cervical Retraction with Towel  - 1 x daily - 5 x weekly - 1-2 sets - 10 reps - Supine Bridge  - 1 x daily - 5 x weekly - 1 sets - 15 reps - Scapular retraction with resistance  - 1 x daily - 5 x weekly - 1-2 sets - 15 reps -Cervical AROM in all planes as tolerated  You Can Walk For A Certain Length Of Time Each Day (use rollator for safety and have wife nearby for supervision)                          Walk 4-5 minutes 2-3 times per day.             Increase 2-3  minutes every week.             Work up to 20 minutes continuously once per day.               Example:                         Day 1-2           4-5 minutes     3 times per day                         Day 7-8           10-12 minutes 2-3 times per day                         Day 13-14       20-22 minutes 1-2 times per day  NEW GOALS: Goals reviewed with patient? Yes  SHORT TERM GOALS: Target date: 12/18/2022  Pt will be independent and compliant with advanced strength and balance HEP in order to maintain functional progress and improve mobility. Baseline:  Updated and patient compliant and IND (9/26) Goal status:  MET  2.  Pt will decrease 5xSTS to </=13 seconds w/o UE support in order to demonstrate decreased risk for falls and improved functional bilateral LE strength and power. Baseline: 15.91 sec w/ bilateral hands on knees (8/28); 13.91 seconds w/ light BUE support (9/26) Goal status: PARTIALLY MET  3.  Pt will demonstrate a gait speed of >/=0.98 m/sec in order to decrease risk for falls using rollator. Baseline: 0.88 m/sec w/ rollator (8/28); 1.05 m/sec w/ rollator  (9/26) Goal status: MET  LONG TERM GOALS: Target date: 01/15/2023  1.  Pt will demonstrate a gait speed of >/=1.08 m/sec in order to decrease risk for falls using no more than a cane. Baseline: 0.88 m/sec w/ rollator (8/28) Goal status: INITIAL  2.  Pt will increase BERG balance score to >/=52/56  to demonstrate improved static balance. Baseline: 48/56  (8/28) Goal status: REVISED  3.  Pt will ambulate >/=800 feet at no more than modI level of assist over unlevel surfaces and grass to promote household and community access. Baseline: inside clinic level distance w/ SPC SBA; 500 ft w/ SPC SBA unlevel sidewalk (9/4) Goal status: INITIAL  4.  Patient will report average 7 day pain of </=5/10 at rest in order to demonstrate improved quality of life. Baseline: 7/10 left UE recent week average (8/28) Goal status: ONGOING  ASSESSMENT:  CLINICAL IMPRESSION: Initiated session today with certified therapist performing TPDN as above.  Remainder of session focused on NMR to improve stepping strategy and dynamic stability with inclusion of left UE dexterity.  Will plan to assess LTGs next session with likely plan for discharge unless marked change in progress.  OBJECTIVE IMPAIRMENTS: Abnormal gait, decreased activity tolerance, decreased balance, decreased cognition, decreased coordination, decreased endurance, decreased knowledge of condition, difficulty walking, decreased ROM, decreased strength, impaired sensation, impaired tone, improper body mechanics, postural dysfunction, and pain.   ACTIVITY LIMITATIONS: carrying, lifting, bending, standing, squatting, stairs, transfers, bed mobility, bathing, reach over head, and locomotion level  PARTICIPATION LIMITATIONS: meal prep, cleaning, laundry, medication management, driving, shopping, community activity, occupation, and yard work  PERSONAL FACTORS: Age, Fitness, Past/current experiences, Transportation, and 1 comorbidity: chronic Hep C  are also affecting patient's functional outcome.   REHAB POTENTIAL: Good  CLINICAL DECISION MAKING: Evolving/moderate complexity  EVALUATION COMPLEXITY: Moderate  PLAN:  PT FREQUENCY: 1-2x/week + 1x/wk (re-cert 1/61)  PT DURATION: 8 weeks + 8 weeks (re-cert 0/96)  PLANNED INTERVENTIONS: Therapeutic exercises, Therapeutic activity, Neuromuscular re-education,  Balance training, Gait training, Patient/Family education, Self Care, Stair training, Vestibular training, Orthotic/Fit training, DME instructions, Dry Needling, scar mobilization, Manual therapy, and Re-evaluation  PLAN FOR NEXT SESSION:   ASSESS LTGs - D/C?   Sadie Haber, PT, DPT Peter Congo, PT, DPT, CSRS   01/13/2023, 2:47 PM

## 2023-01-13 NOTE — Patient Instructions (Addendum)
(  Exercise) Monday Tuesday Wednesday Thursday Friday Saturday Sunday   OT - Putty           OT - Coordination           Desensitization           OT - Dowel           OT - Rufina Falco           PT - Walking           PT - Leg Exc           PT - Neck Exc           PT - Stairs

## 2023-01-15 ENCOUNTER — Encounter
Payer: No Typology Code available for payment source | Attending: Physical Medicine and Rehabilitation | Admitting: Physical Medicine and Rehabilitation

## 2023-01-15 ENCOUNTER — Encounter: Payer: Self-pay | Admitting: Physical Medicine and Rehabilitation

## 2023-01-15 VITALS — BP 137/88 | HR 97 | Ht 72.0 in | Wt 167.4 lb

## 2023-01-15 DIAGNOSIS — R269 Unspecified abnormalities of gait and mobility: Secondary | ICD-10-CM | POA: Diagnosis present

## 2023-01-15 DIAGNOSIS — S14155D Other incomplete lesion at C5 level of cervical spinal cord, subsequent encounter: Secondary | ICD-10-CM | POA: Diagnosis present

## 2023-01-15 DIAGNOSIS — R252 Cramp and spasm: Secondary | ICD-10-CM

## 2023-01-15 DIAGNOSIS — G8254 Quadriplegia, C5-C7 incomplete: Secondary | ICD-10-CM

## 2023-01-15 DIAGNOSIS — G8921 Chronic pain due to trauma: Secondary | ICD-10-CM | POA: Diagnosis present

## 2023-01-15 MED ORDER — DULOXETINE HCL 60 MG PO CPEP: 120.0000 mg | ORAL_CAPSULE | Freq: Every day | ORAL | 5 refills | Status: DC

## 2023-01-15 MED ORDER — GABAPENTIN 600 MG PO TABS: 1200.0000 mg | ORAL_TABLET | Freq: Two times a day (BID) | ORAL | 5 refills | Status: DC

## 2023-01-15 NOTE — Progress Notes (Signed)
Subjective:    Patient ID: Kenneth Flores, male    DOB: [birth year], 62 y.o.   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  Pt also has post op pain, nerve pain; Neurogenic bowel and bladder, HTN, and prior tobacco use;  Here for SCI f/u.    BP doing OK-  Sees PCP regualrly about this .   Sees Dr Jordan Likes Wednesday-   Still taking Baclofen- takes 3x/day- one in Am, evening and 1 at night. Taking 10 mg TID.  Doesn't feel like  Helping pain- hasn't tried ot stop or skip a dose, but doesn't feel like making a difference.   Everything burning  in arms and legs. And abdomen.    Tried needling in  neck/shoulders- didn't help shoulders at all- did 2 weeks in a row-  Also did in lower back as well.  And L shoulder also really tight.   No falls- walking with single point cane.   On Duloxetine 60 mg daily and Gabapentin 600 mg TID.   Hard to know if meds/baclofen helping spasms.  When legs down, legs now spasms- won't stop jumping   Has 1 more therapy session left then will be discharged- and then will have to try again in 3-4 months.   Using pedal bike at home- will use more after PT finishes- and walking 1/4 mile every evening pretty much.     Pain Inventory Average Pain 6 Pain Right Now 7 My pain is burning, tingling, and aching  In the last 24 hours, has pain interfered with the following? General activity 5 Relation with others 0 Enjoyment of life 5 What TIME of day is your pain at its worst? morning  and night Sleep (in general) Fair  Pain is worse with: walking and some activites Pain improves with: rest and therapy/exercise Relief from Meds: 5  Family History  Problem Relation Age of Onset   Hepatitis C Brother    Social History   Socioeconomic History   Marital status: Married    Spouse name: Not on file   Number of children: Not on file   Years of education: Not on file    Highest education level: Not on file  Occupational History   Not on file  Tobacco Use   Smoking status: Every Day    Types: Cigars   Smokeless tobacco: Never   Tobacco comments:    10 a day  Substance and Sexual Activity   Alcohol use: Not Currently   Drug use: Not Currently    Types: Cocaine, Marijuana    Comment: years ago   Sexual activity: Not on file  Other Topics Concern   Not on file  Social History Narrative   Not on file   Social Determinants of Health   Financial Resource Strain: Not on file  Food Insecurity: No Food Insecurity (08/19/2022)   Hunger Vital Sign    Worried About Running Out of Food in the Last Year: Never true    Ran Out of Food in the Last Year: Never true  Transportation Needs: No Transportation Needs (08/19/2022)   PRAPARE - Administrator, Civil Service (Medical): No    Lack of Transportation (Non-Medical): No  Physical Activity: Not on file  Stress: Not on file  Social Connections: Not on file   Past Surgical History:  Procedure Laterality Date   ANTERIOR  CERVICAL DECOMP/DISCECTOMY FUSION N/A 08/21/2022   Procedure: ANTERIOR CERVICAL DECOMPRESSION/DISCECTOMY FUSION CERVICAL THREE-FOUR, CERVICAL FOUR-FIVE, CERVICAL FIVE-SIX;  Surgeon: Julio Sicks, MD;  Location: [facility] OR;  Service: Neurosurgery;  Laterality: N/A;   PELVIC FRACTURE SURGERY Right    Past Surgical History:  Procedure Laterality Date   ANTERIOR CERVICAL DECOMP/DISCECTOMY FUSION N/A 08/21/2022   Procedure: ANTERIOR CERVICAL DECOMPRESSION/DISCECTOMY FUSION CERVICAL THREE-FOUR, CERVICAL FOUR-FIVE, CERVICAL FIVE-SIX;  Surgeon: Julio Sicks, MD;  Location: [facility] OR;  Service: Neurosurgery;  Laterality: N/A;   PELVIC FRACTURE SURGERY Right    Past Medical History:  Diagnosis Date   Hepatitis C    BP 137/88   Pulse 97   Ht 6' (1.829 m)   Wt 167 lb 6.4 oz (75.9 kg)   SpO2 99%   BMI 22.70 kg/m   Opioid Risk Score:   Fall Risk Score:  `1  Depression screen [facility] 2/9      10/16/2022   11:30 AM 03/30/2022    4:17 PM  Depression screen PHQ 2/9  Decreased Interest 0 0  Down, Depressed, Hopeless 0 0  PHQ - 2 Score 0 0  Altered sleeping 0   Tired, decreased energy 1   Change in appetite 0   Feeling bad or failure about yourself  0   Trouble concentrating 0   Moving slowly or fidgety/restless 0   Suicidal thoughts 0   PHQ-9 Score 1     Review of Systems  Constitutional: Negative.   HENT: Negative.    Eyes: Negative.   Respiratory: Negative.    Cardiovascular: Negative.   Gastrointestinal: Negative.   Endocrine: Negative.   Genitourinary: Negative.   Musculoskeletal:  Positive for back pain.       Bilateral shoulders and left leg  Skin: Negative.   Allergic/Immunologic: Negative.   Neurological: Negative.   Hematological: Negative.   Psychiatric/Behavioral:  Positive for dysphoric mood.   All other systems reviewed and are negative.      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  Pt also has post op pain, nerve pain; Neurogenic bowel and bladder, HTN, and prior tobacco use; Here for SCI f/u.   Will increase Duloxetine to 120 mg daily- for nerve pain- max dose. Sent in with 5 refills  2. Increase Gabapentin- not too sleepy from meds- 1200 mg 2x/day for nerve pain x 1 week, then 1200 mg 3x/day- max dose- sent in with 5 refills- can alos help spasticity a little as well, of NOTE  3. Let's see thing sgo in int he next 3-4 weeks, and if no SIGNIFICANT improvement-    4. Went over pain pathways and how making a dent in the ground where "walking the  path" all the time of pain, so it takes less  pain to cause the same symptoms.    5.  Lay on stomach 5-10 minutes- 2x/day when gets up in AM and when goes to bed at night- can help spasms.    6. Cannot keep going in therapy if has plateau'ed then insurance requires we stop-  but then will restart it at that time.    7.  Con't Norco for now- but might change to Butrans patch Taking 2 tabs/day- 1/2 tab 4x/day- - will wait to send since computer system for controlled substances down today.    8. Maintain Baclofen 10 mg 3x/day for now-  For spasms/tightness- doesn't need refill today.    9. Try to lay down for 1 hour- 1-2x/day to get some relief.    10. F/U in 1.5  months with Riley Lam and 3 months with me f/u on SCI-    I spent a total of  34  minutes on total care today- >50% coordination of care- due to d/w pt about nerve pain; changing meds, spasticity- and overall pain and how to treat- as detailed above.

## 2023-01-15 NOTE — Patient Instructions (Signed)
  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  Pt also has post op pain, nerve pain; Neurogenic bowel and bladder, HTN, and prior tobacco use; Here for SCI f/u.   Will increase Duloxetine to 120 mg daily- for nerve pain- max dose. Sent in with 5 refills  2. Increase Gabapentin- not too sleepy from meds- 1200 mg 2x/day for nerve pain x 1 week, then 1200 mg 3x/day- max dose- sent in with 5 refills- can alos help spasticity a little as well, of NOTE  3. Let's see thing sgo in int he next 3-4 weeks, and if no SIGNIFICANT improvement-    4. Went over pain pathways and how making a dent in the ground where "walking the  path" all the time of pain, so it takes less pain to cause the same symptoms.    5.  Lay on stomach 5-10 minutes- 2x/day when gets up in AM and when goes to bed at night- can help spasms.    6. Cannot keep going in therapy if has plateau'ed then insurance requires we stop- but then will restart it at that time.    7.  Con't Norco for now- but might change to Butrans patch Taking 2 tabs/day- 1/2 tab 4x/day- - will wait to send since computer system for controlled substances down today.    8. Maintain Baclofen 10 mg 3x/day for now-  For spasms/tightness- doesn't need refill today.    9. Try to lay down for 1 hour- 1-2x/day to get some relief.    10. F/U in 1.5  months with Riley Lam and 3 months with me f/u on SCI-

## 2023-01-20 ENCOUNTER — Ambulatory Visit: Payer: PRIVATE HEALTH INSURANCE

## 2023-01-20 ENCOUNTER — Ambulatory Visit: Payer: PRIVATE HEALTH INSURANCE | Admitting: Occupational Therapy

## 2023-01-20 DIAGNOSIS — R2681 Unsteadiness on feet: Secondary | ICD-10-CM

## 2023-01-20 DIAGNOSIS — M6281 Muscle weakness (generalized): Secondary | ICD-10-CM | POA: Diagnosis not present

## 2023-01-20 DIAGNOSIS — R29898 Other symptoms and signs involving the musculoskeletal system: Secondary | ICD-10-CM

## 2023-01-20 DIAGNOSIS — R2689 Other abnormalities of gait and mobility: Secondary | ICD-10-CM

## 2023-01-20 DIAGNOSIS — R278 Other lack of coordination: Secondary | ICD-10-CM

## 2023-01-20 DIAGNOSIS — R29818 Other symptoms and signs involving the nervous system: Secondary | ICD-10-CM

## 2023-01-20 NOTE — Therapy (Unsigned)
OUTPATIENT OCCUPATIONAL THERAPY NEURO TREATMENT & DISCHARGE SUMMARY  Patient Name: Kenneth Flores MRN: [id number] DOB:13-Jun-1960, 62 y.o., male Today's Date: 01/20/2023  PCP: Norm Salt, [place] REFERRING PROVIDER: Milinda Antis, PA-C  END OF SESSION:  OT End of Session - 01/20/23 1338     Visit Number 16    Number of Visits 16   + Evaluation   Date for OT Re-Evaluation 01/21/23    Authorization Type [employer] 2024 VL: 60 (PT/OT/ST)    OT Start Time 1317    OT Stop Time 1400    OT Time Calculation (min) 43 min    Equipment Utilized During Treatment over the door pulley    Activity Tolerance Patient tolerated treatment well    Behavior During Therapy WFL for tasks assessed/performed             Past Medical History:  Diagnosis Date   Hepatitis C    Past Surgical History:  Procedure Laterality Date   ANTERIOR CERVICAL DECOMP/DISCECTOMY FUSION N/A 08/21/2022   Procedure: ANTERIOR CERVICAL DECOMPRESSION/DISCECTOMY FUSION CERVICAL THREE-FOUR, CERVICAL FOUR-FIVE, CERVICAL FIVE-SIX;  Surgeon: Julio Sicks, MD;  Location: [facility] OR;  Service: Neurosurgery;  Laterality: N/A;   PELVIC FRACTURE SURGERY Right    Patient Active Problem List   Diagnosis Date Noted   Neurogenic bowel 10/16/2022   Spasticity 10/16/2022   Abnormality of gait 10/16/2022   Acquired left foot drop 10/16/2022   Chronic pain due to trauma 10/16/2022   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) 09/09/2022   Adjustment disorder with depressed mood 08/31/2022   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) 08/27/2022   Cervical spinal cord injury (HCC) 08/19/2022   Chronic hepatitis C without hepatic coma (HCC) 09/08/2021   Thrombocytopenia (HCC) 09/08/2021    ONSET DATE: Referral: 09/17/2022 Hospitalized 08/19/22  REFERRING DIAG: G82.54 (ICD-10-CM) - Quadriplegia, C5-C7 incomplete  THERAPY DIAG:  Muscle weakness (generalized)  Other lack of coordination  Other symptoms and signs involving the  nervous system  Other symptoms and signs involving the musculoskeletal system  Rationale for Evaluation and Treatment: Rehabilitation  SUBJECTIVE:   SUBJECTIVE STATEMENT:  Pt reports recent follow-ups with his doctors - surgeon F/U in 6 months and Dr. Berline Chough with F/U in 1.5 months in 3 months physician. Both physicians understood need to DC therapy at this time due to plateau but with future needs to be addressed as needed.  He did get some changes in his medication and instructions on how to help with his symptoms.   Pt accompanied by: self   PERTINENT HISTORY:   Hx of chronic Hep C, status post micturition related syncope from vasovagal episode at home on 08/19/2022 resulting in fall with blow to head. Imaging revealed evidence of significant multilevel cervical degenerative disease with associated disc space collapse and associated spondylosis. Patient with critical spinal stenosis with severe cord compression and cord signal change at C3-4. Severe stenosis at C4-5 and C5-6 without marked cord signal abnormality at these levels. There is some changes posteriorly from C3-C6 with some worrisome findings for some posterior ligamentous injury. There was no evidence of fracture on his CT scan. There is no evidence of malalignment. Neurosurgery consulted and admitted the patient for IV steroids and pain control. On 5/30, he was taken to the OR and underwent C3-4, C4-5, C5-6 anterior cervical discectomy with interbody fusion utilizing interbody cages, local harvested autograft, and anterior plate instrumentation. Motor function improved.   In patient Rehab 08/27/2022 - 09/16/2022   Discharge Diagnoses:  Principal Problem:   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-6 level [facility]) Active Problems:   Adjustment disorder with depressed mood Active problems: Functional deficits secondary to incomplete spinal cord lesion at C5-C7 level Constipation History of chronic hepatitis C Neurogenic bladder with urinary  retention Neurogenic bowel Hypertension  PRECAUTIONS: Cervical and Other: soft collar when moving/walking but takes it off at rest and when sleeping 12/23/22 - No collar anymore. WEIGHT BEARING RESTRICTIONS: No  PAIN:  Are you having pain? Yes: NPRS scale: 7/10 Pain location: L arm, shoulder and across shoulder and lower back today Pain description: nerve pain in shoulders/arm but aching in back Aggravating factors: Overworking it, doing too much Relieving factors: rest/relaxing, oral medication 4x/day, heating pad sometimes, BioFreeze   FALLS: Has patient fallen in last 6 months? Yes. Number of falls Previously 2 related to same vasovagal issues - the first fall without injury was a couple of weeks prior to the second fall when he hit his head on the sink   Additional fall around 10/06/22 when his legs did a split while trying to walk at home  LIVING ENVIRONMENT: Lives with: lives with their spouse - [name] Lives in: House/apartment Stairs: Yes: Internal: 14 steps; on right going up, on left going up, and only half way up on the R side; wife walks with him upstairs to the bedrooms Has following equipment at home: Single point cane, Shower bench, and Grab bars  PLOF: Independent, driving, worked as Administrator (x40 hours/week)  PATIENT GOALS: Get my L arm better, tie shoelaces tighter so they stay tied  OBJECTIVE:   HAND DOMINANCE: Right  ADLs: Overall ADLs: Min to mod assist Transfers/ambulation related to ADLs: SBA with cane Eating: Needs help to cut his food Grooming: He tries to shave himself but gets help for the things he can't reach/get to on his own. UB Dressing: He can dress himself with simple clothes but needs extra time but needs help with fasteners LB Dressing: Needs help to get TED hose on and tie shoelaces Toileting: MI but wife is still monitoring him Bathing: Wife assists with his back and is present during showers Tub Shower transfers: Tub transfer bench - wife  is there for extra support due to him being wobbly and has been trying to step into the tub with grab bars rather than sitting on the bench all the time Equipment: Transfer tub bench and Grab bars  IADLs: Shopping: Wife is performing shopping. Light housekeeping: Dependent on family ie) Not able to safely or effectively help take out the trash, mow the lawn etc. Meal Prep: Wife performs this.  He did help with this a little previously. Community mobility: Uses cane with supervision Medication management: No meds previously but now 4x/day.  Using pill box and patient did sort them himself the last time but his wife is checking to make sure they are accurate and calls him to remind him to take his medication as needed (ie if she is working Catering manager). Financial management: Wife is helping more than she needed to previously Handwriting: 50% legible - Patient reports his current writing is not as good as before (about 50% compared to before) but it has improved significantly from when he had to mark/sign with an "X" only.    Patient is never home alone (if his wife is at work, his stepson will be there).  He stays on the bottom level of the home during the daytime and is off the narcotic in the daytime.  He uses his  cane in the house  MOBILITY STATUS: Needs Assist: supervision for safety  POSTURE COMMENTS:  forward head Sitting balance: WFL  ACTIVITY TOLERANCE: Activity tolerance: Decreased.  Patient reports falling asleep/napping daily at this time (combo of medication etc)  FUNCTIONAL OUTCOME MEASURES: Quick Dash: 88.6  (see eval report for complete list of limitations).  11/17/22 Quick Dash: 43.2  01/06/23 Quick Dash: 36.4     UPPER EXTREMITY ROM:    Active ROM Right eval Left eval  Shoulder flexion 100 90  Shoulder abduction 90 75  Shoulder adduction    Shoulder extension Limited end range   Shoulder internal rotation   Shoulder external rotation    Elbow flexion Limited end  range  Elbow extension Decreased end range (L side spasmed with stretch  Wrist flexion    Wrist extension    Wrist ulnar deviation    Wrist radial deviation    Wrist pronation    Wrist supination    Digital extension Decreased end range and slow to open   (Blank rows = not formally tested at eval)  UPPER EXTREMITY MMT:     MMT Right eval Left eval  Shoulder flexion 3- 3-  Shoulder abduction    Shoulder adduction    Shoulder extension    Shoulder internal rotation    Shoulder external rotation    Middle trapezius    Lower trapezius    Elbow flexion 3 3-  Elbow extension    Wrist flexion    Wrist extension    Wrist ulnar deviation    Wrist radial deviation    Wrist pronation    Wrist supination    (Blank rows = not formally tested at eval)  HAND FUNCTION: Grip strength: Right: 21.1, 18.2, 20.0 lbs; Left: 8.5, 5.7, 7.4  lbs Evaluation: Right Avg 19.8 lbs Left Avg 7.2 lbs  11/11/22: Right 29.3, 24.6, 25.3 Left 14.3, 12.1, 12.1   COORDINATION: 9 Hole Peg test: Right: 45.22  sec; Left: 1:40.28  sec   11/11/22 Right: 32.29 sec Left: 45.96 sec  Box and Blocks:  Right 41 blocks, Left 31 blocks  11/11/22 Right: 41 blocks, Left: 35 blocks  SENSATION: Not tested  EDEMA: None noted in UEs  MUSCLE TONE: Patient has some stiffness with ROM  COGNITION: Overall cognitive status: Within functional limits for tasks assessed BIMS from hospital (08/2022) 15/15  VISION: Subjective report: No changes since accident Baseline vision: Bifocals Visual history: NA  VISION ASSESSMENT: Not tested  Patient has no changes or difficulty with any activities due to vision.  PERCEPTION: Not tested  PRAXIS: Not tested  EVALUATION OBSERVATIONS: Patient seen following physical therapy services this afternoon.  He arrived ambulating with a single-point cane.  Patient is accompanied by his wife.  Patient is soft-spoken and noted to refer to his wife for answers throughout session.  He  has noted to have limitations in bilateral upper extremity range of motion left greater than right with limited success with overhead reaching and extended time for fine motor tasks with both sides but particularly left upper extremity.   TODAY'S TREATMENT:                                                                                                                               -  Therapeutic exercises completed for duration as noted below including: OT reviewed BUE HEPs with pt including with use of over the door pulley provided to him as a graduation gift from therapy.  Adjustments made to improve comfortable shoulder stretch with most benefit in sitting ie) should flexion is > 90 degrees.  Reviewed list of other OT TE ideas provided in previous sessions as provided on calendar for home carryover.  Reassessed coordination with 9 Hole Peg test with excellent improvement from evaluation to now (and even just in the last month).  Evaluation Right: 45.22  sec; Left: 1:40.28  sec 11/11/22 Right: 32.29 sec Left: 45.96 sec 12/23/22 Right: 31.81 sec Left: 40.66 sec 01/20/23 Right: 24.46 sec Left: 34.21 sec   PATIENT EDUCATION: Education details: HEP list, reviewed wall slides Person educated: Patient Education method: Explanation, Demonstration, and Verbal cues Education comprehension: verbalized understanding, returned demonstration, verbal cues required, and needs further education  HOME EXERCISE PROGRAM: 10/14/22: Putty Exercises - Access Code: [facility] 10/21/22: Added Table Top slide to HEP - Same Access code 10/23/2022: coordination HEP 10/28/22: Desensitization Program  11/06/2022: dowel HEP 12/31/22: Red theraband and towel shoulder stretch 01/13/23: HEP list & wall slides  GOALS: Goals reviewed with patient? Yes   SHORT TERM GOALS: Target date: 12/22/22  Patient will improve overhead reaching > 100 degrees for B shoulders to assist with ADLs. Baseline: R 100* L 90* Goal status: MET   11/18/22 - still limited in sitting without assistance but > 90 degrees with pulley or supine in bed, during table and wall slides 01/19/23 - > 100 degrees with wall slides and over the door pulley  2.  Patient will demonstrate at least 30 lbs RUE and 15 lbs LUE grip strength as needed to open containers and carry lightweight objects without dropping them.  Baseline: Right Avg 19.8 lbs Left Avg 7.2 lbs Goal status: MET  3.  Patient will complete nine-hole peg with use of RUE in 35 seconds or less and LUE 75 seconds or less.  Baseline: 9 Hole Peg test: Right: 45.22  sec; Left: 1:40.28  sec Goal status: MET  4.  Patient will demonstrate UE strengthening HEP (putty/theraband etc) with 25% verbal cues or less for proper execution. Baseline: Has various materials from hospital. Goal status: MET  5.  Patient will demonstrate good digital A/AROM HEP to minimize stiffness in hands (esp L) which impacts coordination ie) in hand manipulation of small objects. Baseline: Stiff digital extension with decreased end range and difficulty with managing pegs for 9 hole peg test ie) dropped items etc.  Goal status: MET  LONG TERM GOALS: Target date: 01/21/23  Patient will demonstrate UE strength and coordination to cut various foods using appropriate AE if necessary. Baseline: Unable to cut food/Assisted by family Goal status: MET  2.  Patient will demonstrate UE pinch strength and coordination to tie shoelaces tightly. Baseline: Shoelaces frequently unfasten Goal status: MET  3.  Patient will demonstrate at least 40 lbs RUE and 20 lbs LUE grip strength as needed to open containers and carry lightweight objects.  Baseline: Right Avg 19.8 lbs Left Avg 7.2 lbs Goal status: MET  11/11/22: Right 29.3, 24.6, 25.3 Left 14.3, 12.1, 12.1 Average: Right: 26.4 lbs Left: 12.8 lbs 12/23/22: Right 30.4, 37.0 43.6 Left 19.6, 20.0, 22.2 - Makes L side tingly Average Right: 37.0 Left 20.6 lbs  4.  Patient will  complete nine-hole peg with use of RUE in 30 seconds or less and LUE 40 seconds or less.  Baseline: 9 Hole Peg test: Right: 45.22  sec; Left: 1:40.28  sec Goal status: MET  11/11/22 Right: 32.29 sec Left: 45.96 sec 12/23/22 Right: 34.4 and 31.81 sec Left 47.6 and 40.66 sec 01/20/23 Right: 24.46 sec Left: 34.21 sec  5.  Patient will be able to complete simple food prep activities with good activity tolerance > 15 minutes and safety (ie. Decreased fall risk and implementation of safety precautions). Baseline: Family does all meal prep Goal status: 11/11/22 Discontinued - wife cooks and prepares food for him to warm in microwave (previously did so also)  6. Patient will demonstrate improvement with quick Dash score (reporting <40 % disability or less) indicating improved functional use of affected extremity.   Baseline: 88.6% (10/11 categories as Severe/Unable to perform.  Goal Status: MET 11/18/22 Quick Dash 43.6 01/20/23 Quick Dash 31.8  ASSESSMENT:  CLINICAL IMPRESSION: Pt demonstrated ongoing limitations with B UE shoulder AROM > 90 degrees against gravity but is provided an over the door pulley to work on at home along with an excellent variety of HEP ideas and calendar previous provided to him for carryover of HEP for strengthening, core stability and balance activities. Pt encouraged to continue to work on improving B shoulder flex AAROM above 90 degrees as able but not to overdo exercises and activities.    PERFORMANCE DEFICITS: in functional skills including ADLs, IADLs, coordination, dexterity, proprioception, sensation, tone, ROM, strength, pain, muscle spasms, flexibility, Fine motor control, Gross motor control, mobility, balance, endurance, continence, decreased knowledge of use of DME, and UE functional use, cognitive skills including energy/drive, safety awareness, and thought, and psychosocial skills including coping strategies and routines and behaviors.   IMPAIRMENTS: are limiting  patient from ADLs, IADLs, rest and sleep, work, leisure, and social participation.   CO-MORBIDITIES: may have co-morbidities  that affects occupational performance. Patient will benefit from skilled OT to address above impairments and improve overall function.  REHAB POTENTIAL: Good   PLAN:  OCCUPATIONAL THERAPY DISCHARGE SUMMARY  Visits from Start of Care: 16  Current functional level related to goals / functional outcomes: Pt has met all goals (5/5 short term and 4/4 long term) to satisfactory levels and is pleased with outcomes.   Remaining deficits: Pt has ongoing functional deficits with B shoulder ROM > 90 degrees without significant discomfort and sensory changes (ie) electrical shocks), which affect aspects of ADLs ie) grooming head, IADLS and work related activities.   Education / Equipment: Pt has all needed materials and education. Pt understands how to continue on with self-management. See tx notes for more details.   Patient agrees to discharge due to max benefits received from outpatient occupational therapy at this time.  Pt and physician's are encouraged to refer back to therapies as needed or as changes occur allowing further progress to be made.    Victorino Sparrow, OT 01/20/2023, 4:28 PM

## 2023-01-20 NOTE — Therapy (Unsigned)
OUTPATIENT PHYSICAL THERAPY NEURO TREATMENT   Patient Name: Kenneth Flores MRN: [id number] DOB:Dec 10, 1960, 62 y.o., male Today's Date: 01/21/2023  PHYSICAL THERAPY DISCHARGE SUMMARY  Visits from Start of Care: 16  Current functional level related to goals / functional outcomes: Pt continuing to have pain in shoulder which may be neuropathic in nature   Remaining deficits: Pain goals   Education / Equipment: HEP   Patient agrees to discharge. Patient goals were partially met. Patient is being discharged due to maximized rehab potential.   PCP: Norm Salt, PA REFERRING PROVIDER: Milinda Antis, PA-C  END OF SESSION:  PT End of Session - 01/20/23 1411     Visit Number 16    Number of Visits 25   17 + 8 at re-cert 1/91   Date for PT Re-Evaluation 01/29/23   pushed out due to delay in scheduling   Authorization Type [employer] ULTRA    PT Start Time 1410    PT Stop Time 1450    PT Time Calculation (min) 40 min    Equipment Utilized During Treatment Gait belt    Activity Tolerance Patient tolerated treatment well    Behavior During Therapy WFL for tasks assessed/performed               Past Medical History:  Diagnosis Date   Hepatitis C    Past Surgical History:  Procedure Laterality Date   ANTERIOR CERVICAL DECOMP/DISCECTOMY FUSION N/A 08/21/2022   Procedure: ANTERIOR CERVICAL DECOMPRESSION/DISCECTOMY FUSION CERVICAL THREE-FOUR, CERVICAL FOUR-FIVE, CERVICAL FIVE-SIX;  Surgeon: Julio Sicks, MD;  Location: [facility] OR;  Service: Neurosurgery;  Laterality: N/A;   PELVIC FRACTURE SURGERY Right    Patient Active Problem List   Diagnosis Date Noted   Neurogenic bowel 10/16/2022   Spasticity 10/16/2022   Abnormality of gait 10/16/2022   Acquired left foot drop 10/16/2022   Chronic pain due to trauma 10/16/2022   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 Walking with cane. Pt accompanied by self  PERTINENT HISTORY: Chronic Hep C  Patient had syncopal episode in bathroom at home when urinating on 08/19/2022.  When he fell backwards he hit his head.  On 5/30, he was taken to the OR by Dr. Jordan Likes for C3-C6 anterior cervical discectomy and interbody fusion with anterior plate instrumentation secondary to multilevel cervical degenerative disease with disc space collapse, spondylosis and critical spinal stenosis with severe cord compression and signal change at level C3-4.  PAIN:  Are you having pain? Yes: NPRS scale: 7/10 Pain location: left arm and shoulder; BLE Pain description: tight shooting pains; burning in legs Aggravating factors: random, overworking the arm Relieving factors: pain medicines  PRECAUTIONS: Cervical and Fall-per Dr. Jordan Likes in-basket 7/24 pt has no restrictions.  WEIGHT BEARING RESTRICTIONS: No  FALLS: Has patient fallen in last 6 months? Yes. Number of falls 1-fall that caused surgical need  LIVING ENVIRONMENT: Lives with: lives with their spouse Lives in: House/apartment Stairs: Yes: Internal: 14 steps; on left going up and half of stairs have bilateral rails Has following equipment at home: Single point cane, Shower bench, and Grab bars  PLOF: Requires assistive device  for independence, Needs assistance with ADLs, Needs assistance with homemaking, Needs assistance with gait, and Needs assistance with transfers  PATIENT GOALS: "To try to get better"  OBJECTIVE:   DIAGNOSTIC FINDINGS:  MRI cervical spine 5/29: IMPRESSION: 1. Positive for evidence of both Ligamentous Injury, and Abnormal Cervical Spinal Cord in the setting of trauma: - Prevertebral edema or fluid from C1 through C6. Consider anterior longitudinal ligament injury, although no discrete ligamentous disruption is identified. - mild posterior interspinous ligamentous injury at C3-C4 eccentric to the right. - Abnormal spinal cord signal from C3 to the C5 cord level. While some of this most resembles chronic Myelomalacia (see #2), a component of Acute Cord Contusion cannot be excluded - especially in the left central cord at C3.   *Underlying bulky cervical spine degeneration with spinal stenosis AND spinal cord mass effect from C3-C4 (Moderate) through C5-C6 (mild). Associated moderate to severe degenerative neural foraminal stenosis at the bilateral C4, C5, and left C6 nerve levels.   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  COGNITION: Overall cognitive status:  Wife answers for pt primarily, pt appears to have delayed processing and requires repetition of questions.   SENSATION: Light touch: WFL and pt reporting tingling all the way down the left leg  COORDINATION: LE RAMS:  slow and deliberate BLE heel-to-shin:  limited due to functional weakness  EDEMA:  None noted in BLE, pt and wife deny swelling at home, he is wearing bilateral compression stockings for DVT prevention per wife-she states she will ask MD if these are necessary any longer.  MUSCLE TONE: Questionable left clonus  POSTURE: forward head-wearing soft cervical collar  LOWER EXTREMITY ROM/MMT:     Active  Right Eval Left Eval  Hip flexion 3+/5 2+/5  Hip  extension    Hip abduction    Hip adduction    Hip internal rotation    Hip external rotation    Knee flexion    Knee extension 4-/5 3/5  Ankle dorsiflexion 4/5 3/5  Ankle plantarflexion    Ankle inversion    Ankle eversion     (Blank rows = not tested)  BED MOBILITY:  Sit to supine Complete Independence Supine to sit Complete Independence Rolling to  Right Complete Independence Rolling to Left Complete Independence *Pain with rolling  TRANSFERS: Assistive device utilized: Single point cane  Sit to stand: SBA Stand to sit: SBA Chair to chair: SBA  GAIT: Gait pattern: step through pattern, decreased arm swing- Left, decreased step length- Left, decreased stride length, and narrow BOS Distance walked: various clinic distances Assistive device utilized: Single point cane Level of assistance: SBA  FUNCTIONAL TESTS:  5 times sit to stand: 27.91 seconds w/ BUE support 10 meter walk test: 14.82 seconds w/ SPC = 0.67 m/sec OR 2.23 ft/sec Berg Balance Scale: To be assessed.  PATIENT SURVEYS:  None completed due to time.  TODAY'S TREATMENT:                                                                                                                              DATE:   Gait training: with verbal cues for arm swings. Without cane: 1 x 230', 1 x 1050' (outside level ground along with about 400' of walking on grass) Reassessment performed today- see baseline measrurements in goals    01/20/23 0001  Berg Balance Test  Sit to Stand 4  Standing Unsupported 4  Sitting with Back Unsupported but Feet Supported on Floor or Stool 4  Stand to Sit 4  Transfers 4  Standing Unsupported with Eyes Closed 4  Standing Unsupported with Feet Together 4  From Standing, Reach Forward with Outstretched Arm 4  From Standing Position, Pick up Object from Floor 4  From Standing Position, Turn to Look Behind Over each Shoulder 4  Turn 360 Degrees 4  Standing Unsupported, Alternately Place  Feet on Step/Stool 4  Standing Unsupported, One Foot in Front 4  Standing on One Leg 4  Total Score 56  Berg comment: 56/56    PATIENT EDUCATION: Education details:  Continue HEP and regular walking.  Continue using cane for safety as able - rollator prn.  TPDN (see above).  Tentative plan for discharge and expectations at home to maintain progress. Person educated: Patient Education method: Explanation Education comprehension: verbalized understanding and needs further education  HOME EXERCISE PROGRAM: Access Code: [email or web address] URL: [email or web address] Date: 11/25/2022 Prepared by: Camille Bal  Exercises - Side Stepping with Resistance at Thighs and Counter Support  - 1 x daily - 5 x weekly - 3 sets - 10 reps - Standing Tandem Balance with Counter Support  - 1 x daily - 5 x weekly - 1 sets - 4 reps - 30 seconds hold - Sit to Stand with Resistance Around Legs  - 1 x daily - 5 x weekly - 1-2 sets - 10 reps - Heel Raises with Counter Support  - 1 x daily - 5 x weekly - 2 sets - 12 reps - Supine Chin Tuck  - 1 x daily - 5 x weekly - 3 sets - 10 reps - Forward Backward Monster Walk with Band at Thighs and Counter Support  - 1 x daily -  5 x weekly - 3 sets - 10 reps - Supine Cervical Retraction with Towel  - 1 x daily - 5 x weekly - 1-2 sets - 10 reps - Supine Bridge  - 1 x daily - 5 x weekly - 1 sets - 15 reps - Scapular retraction with resistance  - 1 x daily - 5 x weekly - 1-2 sets - 15 reps -Cervical AROM in all planes as tolerated  You Can Walk For A Certain Length Of Time Each Day (use rollator for safety and have wife nearby for supervision)                          Walk 4-5 minutes 2-3 times per day.             Increase 2-3  minutes every week.             Work up to 20 minutes continuously once per day.               Example:                         Day 1-2           4-5 minutes     3 times per day                         Day 7-8           10-12 minutes  2-3 times per day                         Day 13-14       20-22 minutes 1-2 times per day  NEW GOALS: Goals reviewed with patient? Yes  SHORT TERM GOALS: Target date: 12/18/2022  Pt will be independent and compliant with advanced strength and balance HEP in order to maintain functional progress and improve mobility. Baseline:  Updated and patient compliant and IND (9/26) Goal status:  MET  2.  Pt will decrease 5xSTS to </=13 seconds w/o UE support in order to demonstrate decreased risk for falls and improved functional bilateral LE strength and power. Baseline: 15.91 sec w/ bilateral hands on knees (8/28); 13.91 seconds w/ light BUE support (9/26) Goal status: PARTIALLY MET  3.  Pt will demonstrate a gait speed of >/=0.98 m/sec in order to decrease risk for falls using rollator. Baseline: 0.88 m/sec w/ rollator (8/28); 1.05 m/sec w/ rollator  (9/26) Goal status: MET  LONG TERM GOALS: Target date: 01/15/2023  1.  Pt will demonstrate a gait speed of >/=1.08 m/sec in order to decrease risk for falls using no more than a cane. Baseline: 0.88 m/sec w/ rollator (8/28); 0.71 m/s with [facility] (01/20/23); 0.93 m/s without AD and cues for arm swings bil (01/20/23) Goal status: Goal met 01/20/23  2.  Pt will increase BERG balance score to >/=52/56 to demonstrate improved static balance. Baseline: 48/56 (8/28); 56/56 (01/20/23) Goal status: Goal met  3.  Pt will ambulate >/=800 feet at no more than modI level of assist over unlevel surfaces and grass to promote household and community access. Baseline: inside clinic level distance w/ SPC SBA; 500 ft w/ SPC SBA unlevel sidewalk (9/4); 1050 feet (outside ground with uneven terrains and grass for 400' without AD- 01/20/23) Goal status: Goal met  4.  Patient will report average 7 day  pain of </=5/10 at rest in order to demonstrate improved quality of life. Baseline: 7/10 left UE recent week average (8/28); 7/10 (01/20/23) Goal status: Not met  (01/20/23)  ASSESSMENT:  CLINICAL IMPRESSION: Patient has been seen for total of 16 sessions from 09/25/22 to 01/20/23. Patient has progressed well towards his short term and long term goals. Patient will be discharged from skilled PT  OBJECTIVE IMPAIRMENTS: Abnormal gait, decreased activity tolerance, decreased balance, decreased cognition, decreased coordination, decreased endurance, decreased knowledge of condition, difficulty walking, decreased ROM, decreased strength, impaired sensation, impaired tone, improper body mechanics, postural dysfunction, and pain.   ACTIVITY LIMITATIONS: carrying, lifting, bending, standing, squatting, stairs, transfers, bed mobility, bathing, reach over head, and locomotion level  PARTICIPATION LIMITATIONS: meal prep, cleaning, laundry, medication management, driving, shopping, community activity, occupation, and yard work  PERSONAL FACTORS: Age, Fitness, Past/current experiences, Transportation, and 1 comorbidity: chronic Hep C  are also affecting patient's functional outcome.   REHAB POTENTIAL: Good  CLINICAL DECISION MAKING: Evolving/moderate complexity  EVALUATION COMPLEXITY: Moderate  PLAN:  Discharge from skilled PT   Ileana Ladd, PT 01/21/2023, 9:04 AM

## 2023-02-03 ENCOUNTER — Telehealth: Payer: Self-pay | Admitting: *Deleted

## 2023-02-03 NOTE — Telephone Encounter (Signed)
He only needed it for 90 days- that's why I stopped refiling it- doesn't need longer than 90 days per guidelines for SCI patients- tp prevent clots first 90 days and then by then, his risk is the same as everyone else, so doesn't need after that- thanks- ML

## 2023-02-03 NOTE — Telephone Encounter (Signed)
Kenneth Flores called about his Eliquis refills. It looks like it was last rx on 10/16/22. He says he has been getting it and taking it 2x per day but now pharmacy will not refill. Please advise.

## 2023-02-05 NOTE — Telephone Encounter (Signed)
Notified. 

## 2023-02-22 ENCOUNTER — Telehealth: Payer: Self-pay

## 2023-02-22 MED ORDER — GABAPENTIN 600 MG PO TABS: 1200.0000 mg | ORAL_TABLET | Freq: Two times a day (BID) | ORAL | 5 refills | Status: DC

## 2023-02-22 MED ORDER — HYDROCODONE-ACETAMINOPHEN 10-325 MG PO TABS: 1.0000 | ORAL_TABLET | Freq: Four times a day (QID) | ORAL | 0 refills | Status: DC | PRN

## 2023-02-22 NOTE — Telephone Encounter (Signed)
Gabapentin states #90 with 5 refills but #90 is not enough monthly if patient is taking Gabapentin 600 mg 2 tablets twice a day.

## 2023-02-26 ENCOUNTER — Encounter
Payer: No Typology Code available for payment source | Attending: Physical Medicine and Rehabilitation | Admitting: Registered Nurse

## 2023-02-26 VITALS — BP 117/76 | HR 81 | Ht 72.0 in | Wt 180.0 lb

## 2023-02-26 DIAGNOSIS — Z5181 Encounter for therapeutic drug level monitoring: Secondary | ICD-10-CM | POA: Insufficient documentation

## 2023-02-26 DIAGNOSIS — M792 Neuralgia and neuritis, unspecified: Secondary | ICD-10-CM | POA: Diagnosis present

## 2023-02-26 DIAGNOSIS — S14155D Other incomplete lesion at C5 level of cervical spinal cord, subsequent encounter: Secondary | ICD-10-CM | POA: Insufficient documentation

## 2023-02-26 DIAGNOSIS — Z79899 Other long term (current) drug therapy: Secondary | ICD-10-CM | POA: Insufficient documentation

## 2023-02-26 DIAGNOSIS — G894 Chronic pain syndrome: Secondary | ICD-10-CM | POA: Diagnosis present

## 2023-02-26 DIAGNOSIS — R252 Cramp and spasm: Secondary | ICD-10-CM | POA: Diagnosis present

## 2023-02-26 MED ORDER — HYDROCODONE-ACETAMINOPHEN 10-325 MG PO TABS: 1.0000 | ORAL_TABLET | Freq: Four times a day (QID) | ORAL | 0 refills | Status: DC | PRN

## 2023-02-26 NOTE — Progress Notes (Unsigned)
Subjective:    Patient ID: Kenneth Flores, male    DOB: Feb 21, 1961, 62 y.o.   MRN: [id number]  HPI: Kenneth Flores is a 62 y.o. male who returns for follow up appointment for chronic pain and medication refill. He states his pain is located in his bilateral shoulders, left upper extremity with tingling and burning. Also reports left foot with tingling and burning. He rates his pain 7. His current exercise regime is walking and performing stretching exercises.  Kenneth Flores Morphine equivalent is 40.00 MME.   Oral Swab was Performed today.    Pain Inventory Average Pain 7 Pain Right Now 7 My pain is burning  In the last 24 hours, has pain interfered with the following? General activity 4 Relation with others 0 Enjoyment of life 4 What TIME of day is your pain at its worst? daytime Sleep (in general) Fair  Pain is worse with: some activites Pain improves with: medication Relief from Meds: 6  Family History  Problem Relation Age of Onset   Hepatitis C Brother    Social History   Socioeconomic History   Marital status: Married    Spouse name: Not on file   Number of children: Not on file   Years of education: Not on file   Highest education level: Not on file  Occupational History   Not on file  Tobacco Use   Smoking status: Every Day    Types: Cigars   Smokeless tobacco: Never   Tobacco comments:    10 a day  Substance and Sexual Activity   Alcohol use: Not Currently   Drug use: Not Currently    Types: Cocaine, Marijuana    Comment: years ago   Sexual activity: Not on file  Other Topics Concern   Not on file  Social History Narrative   Not on file   Social Determinants of Health   Financial Resource Strain: Not on file  Food Insecurity: No Food Insecurity (08/19/2022)   Hunger Vital Sign    Worried About Running Out of Food in the Last Year: Never true    Ran Out of Food in the Last Year: Never true  Transportation Needs: No Transportation Needs (08/19/2022)   PRAPARE  - Administrator, Civil Service (Medical): No    Lack of Transportation (Non-Medical): No  Physical Activity: Not on file  Stress: Not on file  Social Connections: Not on file   Past Surgical History:  Procedure Laterality Date   ANTERIOR CERVICAL DECOMP/DISCECTOMY FUSION N/A 08/21/2022   Procedure: ANTERIOR CERVICAL DECOMPRESSION/DISCECTOMY FUSION CERVICAL THREE-FOUR, CERVICAL FOUR-FIVE, CERVICAL FIVE-SIX;  Surgeon: Julio Sicks, MD;  Location: [facility] OR;  Service: Neurosurgery;  Laterality: N/A;   PELVIC FRACTURE SURGERY Right    Past Surgical History:  Procedure Laterality Date   ANTERIOR CERVICAL DECOMP/DISCECTOMY FUSION N/A 08/21/2022   Procedure: ANTERIOR CERVICAL DECOMPRESSION/DISCECTOMY FUSION CERVICAL THREE-FOUR, CERVICAL FOUR-FIVE, CERVICAL FIVE-SIX;  Surgeon: Julio Sicks, MD;  Location: [facility] OR;  Service: Neurosurgery;  Laterality: N/A;   PELVIC FRACTURE SURGERY Right    Past Medical History:  Diagnosis Date   Hepatitis C    BP 117/76   Pulse 81   Ht 6' (1.829 m)   Wt 180 lb (81.6 kg)   SpO2 96%   BMI 24.41 kg/m   Opioid Risk Score:   Fall Risk Score:  `1  Depression screen [facility] 2/9     01/15/2023   10:46 AM 10/16/2022   11:30 AM 03/30/2022    4:17  PM  Depression screen PHQ 2/9  Decreased Interest 1 0 0  Down, Depressed, Hopeless 1 0 0  PHQ - 2 Score 2 0 0  Altered sleeping  0   Tired, decreased energy  1   Change in appetite  0   Feeling bad or failure about yourself   0   Trouble concentrating  0   Moving slowly or fidgety/restless  0   Suicidal thoughts  0   PHQ-9 Score  1     Review of Systems  Musculoskeletal:  Positive for gait problem.       Bilateral lower leg pain Left arm pain Shoulder blade pain  All other systems reviewed and are negative.     Objective:   Physical Exam Vitals and nursing note reviewed.  Constitutional:      Appearance: Normal appearance.  Neck:     Comments: Cervical Paraspinal Tenderness:  C-5-C-6 Cardiovascular:     Rate and Rhythm: Normal rate and regular rhythm.     Pulses: Normal pulses.     Heart sounds: Normal heart sounds.  Musculoskeletal:     Comments: Normal Muscle Bulk and Muscle Testing Reveals:  Upper Extremities: Decreased ROM and Muscle Strength 3/5 Bilateral AC Joint Tenderness Thoracic Paraspinal Tenderness: T-7-T-9  Lumbar Paraspinal Tenderness: L-4-L-5 Lower Extremities: Full ROM and Muscle Strength 5/5 Arises from table slowly using cane for support Antalgic  Gait     Skin:    General: Skin is warm and dry.  Neurological:     Mental Status: He is alert and oriented to person, place, and time.  Psychiatric:        Mood and Affect: Mood normal.        Behavior: Behavior normal.         Assessment & Plan:  Incomplete Spinal Cord Lesion: Continue current medication regimen, continue to monitor. Spasticity: Continue Baclofen. Continue to monitor.  Neuropathic Pain: Continue Cymbalta and Gabapentin. Continue to Monitor.   Chronic Pain Syndrome: Hydrocodone 10- 325 mg one tablet every 6 hours as needed for pain  #120. We will continue the opioid monitoring program, this consists of regular clinic visits, examinations, urine drug screen, pill counts as well as use of [place] Controlled Substance Reporting system. A 12 month History has been reviewed on the [place] Controlled Substance Reporting System on 02/26/2023.   F/U with Dr Berline Chough, he has a scheduled appointment.

## 2023-02-28 ENCOUNTER — Encounter: Payer: Self-pay | Admitting: Registered Nurse

## 2023-03-02 LAB — DRUG TOX MONITOR 1 W/CONF, ORAL FLD
Amphetamines: NEGATIVE ng/mL (ref <10)
Barbiturates: NEGATIVE ng/mL (ref <10)
Benzodiazepines: NEGATIVE ng/mL (ref <0.50)
Buprenorphine: NEGATIVE ng/mL (ref <0.10)
Cocaine: NEGATIVE ng/mL (ref <5.0)
Codeine: NEGATIVE ng/mL (ref <2.5)
Cotinine: >250 ng/mL — ABNORMAL HIGH (ref <5.0)
Dihydrocodeine: 6.8 ng/mL — ABNORMAL HIGH (ref <2.5)
Fentanyl: NEGATIVE ng/mL (ref <0.10)
Heroin Metabolite: NEGATIVE ng/mL (ref <1.0)
Hydrocodone: >250 ng/mL — ABNORMAL HIGH (ref <2.5)
Hydromorphone: NEGATIVE ng/mL (ref <2.5)
MARIJUANA: POSITIVE ng/mL — AB (ref <2.5)
MDMA: NEGATIVE ng/mL (ref <10)
Meprobamate: NEGATIVE ng/mL (ref <2.5)
Methadone: NEGATIVE ng/mL (ref <5.0)
Morphine: NEGATIVE ng/mL (ref <2.5)
Nicotine Metabolite: POSITIVE ng/mL — AB (ref <5.0)
Norhydrocodone: 9.8 ng/mL — ABNORMAL HIGH (ref <2.5)
Noroxycodone: NEGATIVE ng/mL (ref <2.5)
Opiates: POSITIVE ng/mL — AB (ref <2.5)
Oxycodone: NEGATIVE ng/mL (ref <2.5)
Oxymorphone: NEGATIVE ng/mL (ref <2.5)
Phencyclidine: NEGATIVE ng/mL (ref <10)
THC: 166.6 ng/mL — ABNORMAL HIGH (ref <2.5)
Tapentadol: NEGATIVE ng/mL (ref <5.0)
Tramadol: NEGATIVE ng/mL (ref <5.0)
Zolpidem: NEGATIVE ng/mL (ref <5.0)

## 2023-03-02 LAB — DRUG TOX ALC METAB W/CON, ORAL FLD: Alcohol Metabolite: NEGATIVE ng/mL (ref <25)

## 2023-03-03 ENCOUNTER — Telehealth: Payer: Self-pay | Admitting: Registered Nurse

## 2023-03-03 NOTE — Telephone Encounter (Signed)
This provider spoke with Dr Berline Chough regarding Mr. Niese UDS. , He never received  warning letter. He will received a warning letter for + THC, he was also instructed to stop smoking Marijuana, he verbalizes understanding.

## 2023-03-03 NOTE — Telephone Encounter (Signed)
UDS results was Reviewed.  02/26/2023 Results + THC 166.6 10/16/2022 Results was Reviewed. + THC 48.8,  Call placed to Kenneth Flores, he admits to smoking marijuana , this was discussed with Dr Berline Chough.

## 2023-03-23 ENCOUNTER — Telehealth: Payer: Self-pay

## 2023-03-23 NOTE — Telephone Encounter (Signed)
A formal/final warning letter has been sent to Mr Kenneth Flores for having marijuana present in his oral drug screen.

## 2023-03-23 NOTE — Telephone Encounter (Signed)
(  KeyShanon Payor) PA Case ID #: [id number] Hydrocodone/APAP submitted

## 2023-03-25 ENCOUNTER — Telehealth: Payer: Self-pay

## 2023-03-25 NOTE — Telephone Encounter (Signed)
 Patient called in to clarify how much gabapentin he should be taken, pharmacy only gave him 90 pills, he was wondering if he should be taking it 2x a day or 3x a day

## 2023-03-25 NOTE — Telephone Encounter (Signed)
 Hydrocodone 10-325 MG approved until 10/21/2023. By fax 03/24/2022.

## 2023-04-12 ENCOUNTER — Encounter: Payer: Self-pay | Admitting: Gastroenterology

## 2023-04-16 ENCOUNTER — Encounter: Payer: Self-pay | Admitting: Physical Medicine and Rehabilitation

## 2023-04-16 ENCOUNTER — Encounter
Payer: No Typology Code available for payment source | Attending: Physical Medicine and Rehabilitation | Admitting: Physical Medicine and Rehabilitation

## 2023-04-16 VITALS — BP 119/82 | HR 89 | Ht <= 58 in | Wt 173.2 lb

## 2023-04-16 DIAGNOSIS — M6281 Muscle weakness (generalized): Secondary | ICD-10-CM | POA: Diagnosis present

## 2023-04-16 DIAGNOSIS — G8921 Chronic pain due to trauma: Secondary | ICD-10-CM | POA: Insufficient documentation

## 2023-04-16 DIAGNOSIS — M792 Neuralgia and neuritis, unspecified: Secondary | ICD-10-CM | POA: Insufficient documentation

## 2023-04-16 DIAGNOSIS — R269 Unspecified abnormalities of gait and mobility: Secondary | ICD-10-CM | POA: Diagnosis present

## 2023-04-16 DIAGNOSIS — G8254 Quadriplegia, C5-C7 incomplete: Secondary | ICD-10-CM | POA: Insufficient documentation

## 2023-04-16 DIAGNOSIS — R252 Cramp and spasm: Secondary | ICD-10-CM | POA: Insufficient documentation

## 2023-04-16 MED ORDER — BACLOFEN 10 MG PO TABS: 10.0000 mg | ORAL_TABLET | Freq: Three times a day (TID) | ORAL | 1 refills | Status: DC

## 2023-04-16 NOTE — Progress Notes (Signed)
Subjective:    Patient ID: Kenneth Flores, male    DOB: 08-18-1960, 63 y.o.   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  Pt also has post op pain, nerve pain; Neurogenic bowel and bladder, HTN, and prior tobacco use;   Here for SCI f/u.   Wants to come off opiates, doesn't want to take anymore.  Doesn't want to get addicted.      Is at max dose of Gabapentin 1200 mg TID.  Duloxetine 120 mg daily-     Nerve pain- hasn't been too bad- is burning- but doesn't think about most of time- but then when thinks about it- it   Main pain is in balls of B/L shoulders and across neck and down LUE.  Occasionally in low back-   Walking with cane-  Been a little off with balance for past week- frequently moves without cane in the house, but outside the house, uses single point cane.   Complains more when it's cold.   Bowel- not taking any bowel meds-  Drinks ensure every day. Cause likes it and for nutrition. Replaces a meal sometimes- but not as much lately.   Hx of dry needling for neck and shoulders and didn't work.   Still taking baclofen-  10 mg TID- Spasticity is doing OK-  not jumping, jerking or dancing like used to.    Is taking Norco- 1/2 4x/day-     Thinks could walk around [employer]- went to [employer], but a little tired. Moves around better.       Pain Inventory Average Pain 5 Pain Right Now 5 My pain is constant, burning, and tingling  In the last 24 hours, has pain interfered with the following? General activity 5 Relation with others 5 Enjoyment of life 5 What TIME of day is your pain at its worst? morning , daytime, evening, and night Sleep (in general) Good  Pain is worse with: some activites Pain improves with: rest Relief from Meds: 3  Family History  Problem Relation Age of Onset   Hepatitis C Brother    Social History   Socioeconomic History   Marital status:  Married    Spouse name: Not on file   Number of children: Not on file   Years of education: Not on file   Highest education level: Not on file  Occupational History   Not on file  Tobacco Use   Smoking status: Every Day    Types: Cigars   Smokeless tobacco: Never   Tobacco comments:    10 a day  Substance and Sexual Activity   Alcohol use: Not Currently   Drug use: Not Currently    Types: Cocaine, Marijuana    Comment: years ago   Sexual activity: Not on file  Other Topics Concern   Not on file  Social History Narrative   Not on file   Social Drivers of Health   Financial Resource Strain: Not on file  Food Insecurity: No Food Insecurity (08/19/2022)   Hunger Vital Sign    Worried About Running Out of Food in the Last Year: Never true    Ran Out of Food in the Last Year: Never true  Transportation Needs: No Transportation Needs (08/19/2022)   PRAPARE - Administrator, Civil Service (Medical): No    Lack of Transportation (Non-Medical): No  Physical Activity:  Not on file  Stress: Not on file  Social Connections: Not on file   Past Surgical History:  Procedure Laterality Date   ANTERIOR CERVICAL DECOMP/DISCECTOMY FUSION N/A 08/21/2022   Procedure: ANTERIOR CERVICAL DECOMPRESSION/DISCECTOMY FUSION CERVICAL THREE-FOUR, CERVICAL FOUR-FIVE, CERVICAL FIVE-SIX;  Surgeon: Julio Sicks, MD;  Location: [facility] OR;  Service: Neurosurgery;  Laterality: N/A;   PELVIC FRACTURE SURGERY Right    Past Surgical History:  Procedure Laterality Date   ANTERIOR CERVICAL DECOMP/DISCECTOMY FUSION N/A 08/21/2022   Procedure: ANTERIOR CERVICAL DECOMPRESSION/DISCECTOMY FUSION CERVICAL THREE-FOUR, CERVICAL FOUR-FIVE, CERVICAL FIVE-SIX;  Surgeon: Julio Sicks, MD;  Location: [facility] OR;  Service: Neurosurgery;  Laterality: N/A;   PELVIC FRACTURE SURGERY Right    Past Medical History:  Diagnosis Date   Hepatitis C    BP 119/82   Pulse 89   Ht 1' (0.305 m)   Wt 173 lb 3.2 oz (78.6 kg)   SpO2 93%    BMI 845.65 kg/m   Opioid Risk Score:   Fall Risk Score:  `1  Depression screen [facility] 2/9     04/16/2023    2:54 PM 01/15/2023   10:46 AM 10/16/2022   11:30 AM 03/30/2022    4:17 PM  Depression screen PHQ 2/9  Decreased Interest 0 1 0 0  Down, Depressed, Hopeless 0 1 0 0  PHQ - 2 Score 0 2 0 0  Altered sleeping   0   Tired, decreased energy   1   Change in appetite   0   Feeling bad or failure about yourself    0   Trouble concentrating   0   Moving slowly or fidgety/restless   0   Suicidal thoughts   0   PHQ-9 Score   1      Review of Systems  Musculoskeletal:  Positive for gait problem.  Neurological:        Burning and tingling  All other systems reviewed and are negative.      Objective:   Physical Exam Awake, alert, appropriate,e using pretty single point cane  Accompanied by SO, NAD MSK: RUE- deltoid 3-/5; Biceps 4/5 ; triceps 3+/5, WE 4/5; grip 3/5; FA 2+/5 LUE_ Deltoid 3-/5; biceps 4-/5; triceps 3/5; WE 3+/5; Grip 3-/5; FA 2/5 RLE- HF 3+/5; KE 5-/5; DF 5-/5 and PF 4+/5 LLE-  HF 3/5; KE 4+/5; DF 4/5; and PF 4-/5  Neuro:  Brisk hoffman's B/L MAS of 1 in Ue's and 1+ in LE's- with a few beats clonus B/L worse on L  Less depressed, still quiet.      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  Pt also has post op pain, nerve pain; Neurogenic bowel and bladder, HTN, and prior tobacco use;   Here for SCI f/u.     Use massage gun or stretches for shoulders and neck tightness- can use daily.  Don't use til gets bruised-   2.   Trigger point injections and dry needling- didn't really help in hospital. So not real interested for this.    3.   Will refill Baclofen 10 mg 3x/day- 3 months refills.    4. Con't gabapentin 1200 mg 3x/day- last filled 02/22/23  5. Con't Duloxetine 120 mg daily- refill last 01/15/23   6. Will wean Norco- decrease Norco by 1/2 tab every 4-5 days-  so  should be able to wean off before finishes  40 tabs he has.    7.  Grateful spasticity isn't getting worse- if anything it's a little better.  So don't need to increase spasticity meds.    8. For now, Wait on Keppra-  for nerve pain if wants in future-  250 mg BID x 1 week, then 500 mg BID. Would try next.   9.  F/U in 3months- double appt- SCI   I spent a total of 32    minutes on total care today- >50% coordination of care- due to discussion with pt about weaning  norco- as well as discussion about spasticity and nerve pain as detailed above.

## 2023-04-16 NOTE — Patient Instructions (Signed)
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  Pt also has post op pain, nerve pain; Neurogenic bowel and bladder, HTN, and prior tobacco use;   Here for SCI f/u.     Use massage gun or stretches for shoulders and neck tightness- can use daily.  Don't use til gets bruised-   2.   Trigger point injections and dry needling- didn't really help in hospital. So not real interested for this.    3.   Will refill Baclofen 10 mg 3x/day- 3 months refills.    4. Con't gabapentin 1200 mg 3x/day- last filled 02/22/23  5. Con't Duloxetine 120 mg daily- refill last 01/15/23   6. Will wean Norco- decrease Norco by 1/2 tab every 4-5 days-  so should be able to wean off before finishes 40 tabs he has.    7.  Grateful spasticity isn't getting worse- if anything it's a little better.  So don't need to increase spasticity meds.    8. For now, Wait on Keppra-  for nerve pain if wants in future-  250 mg BID x 1 week, then 500 mg BID. Would try next.   9.  F/U in 3months- double appt- SCI

## 2023-05-10 DIAGNOSIS — R5383 Other fatigue: Secondary | ICD-10-CM | POA: Insufficient documentation

## 2023-05-10 DIAGNOSIS — M25519 Pain in unspecified shoulder: Secondary | ICD-10-CM | POA: Insufficient documentation

## 2023-05-10 DIAGNOSIS — K759 Inflammatory liver disease, unspecified: Secondary | ICD-10-CM | POA: Insufficient documentation

## 2023-05-10 DIAGNOSIS — M545 Low back pain, unspecified: Secondary | ICD-10-CM | POA: Insufficient documentation

## 2023-05-12 NOTE — Progress Notes (Unsigned)
Chief Complaint:worsening GERD and nausea Primary GI Doctor:unassigned  HPI:  Patient is a  63  year old male patient with past medical history of Constipation, History of chronic hepatitis C, Neurogenic bladder with urinary retention, Neurogenic bowel, Hypertension, chronic pain syndrome, who was referred to me by Norm Salt, PA on 04/12/23 for a complaint of worsening GERD and nausea.  Past medical history is significant for chronic hepatitis C; genotype 1a and is s/p Vosevi time 12 weeks. Undetectable viral RNA 01/21/2022. Follows with ID.  Interval History  Patient admits/denies GERD Patient taking pantoprazole 40 mg p.o. daily Patient taking Carafate 1 g 4 times daily Patient admits/denies dysphagia Patient admits/denies nausea, vomiting, or weight loss  Patient taking MiraLAX p.o. daily Patient admits/denies altered bowel habits Patient admits/denies abdominal pain Patient admits/denies rectal bleeding   Denies/Admits alcohol Denies/Admits smoking Denies/Admits NSAID use. Denies/Admits they are on blood thinners.  Patients last colonoscopy Patients last EGD  Patient's family history includes  Wt Readings from Last 3 Encounters:  04/16/23 173 lb 3.2 oz (78.6 kg)  02/26/23 180 lb (81.6 kg)  01/15/23 167 lb 6.4 oz (75.9 kg)      Past Medical History:  Diagnosis Date   Hepatitis C     Past Surgical History:  Procedure Laterality Date   ANTERIOR CERVICAL DECOMP/DISCECTOMY FUSION N/A 08/21/2022   Procedure: ANTERIOR CERVICAL DECOMPRESSION/DISCECTOMY FUSION CERVICAL THREE-FOUR, CERVICAL FOUR-FIVE, CERVICAL FIVE-SIX;  Surgeon: Julio Sicks, MD;  Location: [facility] OR;  Service: Neurosurgery;  Laterality: N/A;   PELVIC FRACTURE SURGERY Right     Current Outpatient Medications  Medication Sig Dispense Refill   baclofen (LIORESAL) 10 MG tablet Take 1 tablet (10 mg total) by mouth 3 (three) times daily. 270 tablet 1   docusate sodium (COLACE) 100 MG capsule Take 1  capsule (100 mg total) by mouth 2 (two) times daily. 10 capsule 0   DULoxetine (CYMBALTA) 60 MG capsule Take 2 capsules (120 mg total) by mouth at bedtime. For nerve pain- max dose 60 capsule 5   gabapentin (NEURONTIN) 600 MG tablet Take 2 tablets (1,200 mg total) by mouth 2 (two) times daily. X  week, then 1200 mg/2 tabs 3x/day- for nerve pain- max dose 180 tablet 5   hydrochlorothiazide (HYDRODIURIL) 25 MG tablet Take 1 tablet (25 mg total) by mouth daily. 30 tablet 0   HYDROcodone-acetaminophen (NORCO) 10-325 MG tablet Take 1 tablet by mouth every 6 (six) hours as needed for severe pain (pain score 7-10) ((score 7 to 10)). For post op pain 120 tablet 0   lisinopril (ZESTRIL) 10 MG tablet Take 1 tablet (10 mg total) by mouth daily. 30 tablet 0   melatonin 5 MG TABS Take 1 tablet (5 mg total) by mouth at bedtime. (Patient not taking: Reported on 04/16/2023) 30 tablet 0   methocarbamol (ROBAXIN) 500 MG tablet Take 500 mg by mouth 4 (four) times daily.     pantoprazole (PROTONIX) 40 MG tablet Take 40 mg by mouth daily.     polyethylene glycol (MIRALAX / GLYCOLAX) 17 g packet Take 17 g by mouth daily. 14 each 0   sucralfate (CARAFATE) 1 g tablet Take 1 g by mouth 4 (four) times daily -  with meals and at bedtime.     No current facility-administered medications for this visit.    Allergies as of 05/13/2023   (No Known Allergies)    Family History  Problem Relation Age of Onset   Hepatitis C Brother     Review  of Systems:    Constitutional: No weight loss, fever, chills, weakness or fatigue HEENT: Eyes: No change in vision               Ears, Nose, Throat:  No change in hearing or congestion Skin: No rash or itching Cardiovascular: No chest pain, chest pressure or palpitations   Respiratory: No SOB or cough Gastrointestinal: See HPI and otherwise negative Genitourinary: No dysuria or change in urinary frequency Neurological: No headache, dizziness or syncope Musculoskeletal: No new  muscle or joint pain Hematologic: No bleeding or bruising Psychiatric: No history of depression or anxiety    Physical Exam:  Vital signs: There were no vitals taken for this visit.  Constitutional:   Pleasant Caucasian male*** appears to be in NAD, Well developed, Well nourished, alert and cooperative Head:  Normocephalic and atraumatic. Eyes:   PEERL, EOMI. No icterus. Conjunctiva pink. Ears:  Normal auditory acuity. Neck:  Supple Throat: Oral cavity and pharynx without inflammation, swelling or lesion.  Respiratory: Respirations even and unlabored. Lungs clear to auscultation bilaterally.   No wheezes, crackles, or rhonchi.  Cardiovascular: Normal S1, S2. Regular rate and rhythm. No peripheral edema, cyanosis or pallor.  Gastrointestinal:  Soft, nondistended, nontender. No rebound or guarding. Normal bowel sounds. No appreciable masses or hepatomegaly. Rectal:  Not performed.  Anoscopy: Msk:  Symmetrical without gross deformities. Without edema, no deformity or joint abnormality.  Neurologic:  Alert and  oriented x4;  grossly normal neurologically.  Skin:   Dry and intact without significant lesions or rashes. Psychiatric: Oriented to person, place and time. Demonstrates good judgement and reason without abnormal affect or behaviors.  RELEVANT LABS AND IMAGING: CBC    Latest Ref Rng & Units 09/14/2022    6:06 AM 09/07/2022    5:58 AM 08/31/2022    6:24 AM  CBC  WBC 4.0 - 10.5 K/uL 6.0  5.9  9.4   Hemoglobin 13.0 - 17.0 g/dL 16.1  09.6  04.5   Hematocrit 39.0 - 52.0 % 38.3  38.2  40.2   Platelets 150 - 400 K/uL 169  163  176      CMP     Latest Ref Rng & Units 09/14/2022    6:06 AM 09/07/2022    5:58 AM 08/31/2022    6:24 AM  CMP  Glucose 70 - 99 mg/dL 96  409  811   BUN 6 - 20 mg/dL 15  23  18    Creatinine 0.61 - 1.24 mg/dL 9.14  7.82  9.56   Sodium 135 - 145 mmol/L 132  136  130   Potassium 3.5 - 5.1 mmol/L 4.1  4.0  3.5   Chloride 98 - 111 mmol/L 98  96  93   CO2  22 - 32 mmol/L 26  28  28    Calcium 8.9 - 10.3 mg/dL 9.0  9.1  9.1      05/06/863 CT chest abdomen pelvis with contrast IMPRESSION: 1. No acute intracranial CT findings or depressed skull fractures. 2. Cervical degenerative changes without evidence of fractures or listhesis. 3. Moderate to severe spinal canal stenosis with cord compression at C3-4, C4-5, and C5-6 due to short pedicles and dorsal disc osteophyte complexes. 4. No acute trauma related findings in the chest, abdomen or pelvis. 5. 1.2 x 0.9 cm rounded pleural-based nodule in the posterolateral base of the right lower lobe with pleurodiaphragmatic stranding. This could be a nodular scar, but is nonspecific. Consider one of the following in 3 months  for both low-risk and high-risk individuals: (a) repeat chest CT, (b) follow-up PET-CT, or (c) tissue sampling. This recommendation follows the consensus statement: Guidelines for Management of Incidental Pulmonary Nodules Detected on CT Images: From the Fleischner Society 2017; Radiology 2017; 284:228-243. 6. 3 mm right upper lobe suprahilar nodule, also new. 7. Aortic and coronary artery atherosclerosis. 8. Diverticulosis without evidence of diverticulitis. 9. 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 Acuity indeterminate. 10. Emphysema. 09/10/2021 US abdominal RUQ with elastography ULTRASOUND RUQ:  Coarsened hepatic echotexture with increased hepatic echogenicity suggestive of chronic hepatocellular disease. No suspicious hepatic lesion. ULTRASOUND HEPATIC ELASTOGRAPHY: Median kPa: 25.2 Diagnostic category: > or =17 kPa: highly suggestive of cACLD with an increased probability of clinically significant portal hypertension.  Assessment: 1. ***  Plan: Imaging [facility])   Thank you for the courtesy of this consult. Please call me with any questions or  concerns.   Cecille Mcclusky, FNP-C [facility] 05/12/2023, 5:59 PM  Cc: Norm Salt, PA

## 2023-05-13 ENCOUNTER — Ambulatory Visit: Payer: No Typology Code available for payment source | Admitting: Gastroenterology

## 2023-05-13 ENCOUNTER — Encounter: Payer: Self-pay | Admitting: Gastroenterology

## 2023-05-13 VITALS — BP 118/62 | HR 82 | Ht 72.0 in | Wt 176.0 lb

## 2023-05-13 DIAGNOSIS — R1319 Other dysphagia: Secondary | ICD-10-CM

## 2023-05-13 DIAGNOSIS — K219 Gastro-esophageal reflux disease without esophagitis: Secondary | ICD-10-CM | POA: Diagnosis not present

## 2023-05-13 DIAGNOSIS — B182 Chronic viral hepatitis C: Secondary | ICD-10-CM | POA: Diagnosis not present

## 2023-05-13 MED ORDER — PANTOPRAZOLE SODIUM 40 MG PO TBEC: 40.0000 mg | DELAYED_RELEASE_TABLET | Freq: Every day | ORAL | 3 refills | Status: DC

## 2023-05-13 MED ORDER — FAMOTIDINE 20 MG PO TABS: 20.0000 mg | ORAL_TABLET | Freq: Every day | ORAL | 3 refills | Status: DC

## 2023-05-13 NOTE — Patient Instructions (Addendum)
You have been scheduled for a Barium Esophogram at [facility] (1st floor of the hospital) on 05/20/2023 at 10:30AM. Please arrive 30 minutes prior to your appointment for registration. Make certain not to have anything to eat or drink 3 hours prior to your test. If you need to reschedule for any reason, please contact radiology at [phone] to do so. __________________________________________________________________ A barium swallow is an examination that concentrates on views of the esophagus. This tends to be a double contrast exam (barium and two liquids which, when combined, create a gas to distend the wall of the oesophagus) or single contrast (non-ionic iodine based). The study is usually tailored to your symptoms so a good history is essential. Attention is paid during the study to the form, structure and configuration of the esophagus, looking for functional disorders (such as aspiration, dysphagia, achalasia, motility and reflux) EXAMINATION You may be asked to change into a gown, depending on the type of swallow being performed. A radiologist and radiographer will perform the procedure. The radiologist will advise you of the type of contrast selected for your procedure and direct you during the exam. You will be asked to stand, sit or lie in several different positions and to hold a small amount of fluid in your mouth before being asked to swallow while the imaging is performed .In some instances you may be asked to swallow barium coated marshmallows to assess the motility of a solid food bolus. The exam can be recorded as a digital or video fluoroscopy procedure. POST PROCEDURE It will take 1-2 days for the barium to pass through your system. To facilitate this, it is important, unless otherwise directed, to increase your fluids for the next 24-48hrs and to resume your normal diet.  This test typically takes about 30 minutes to  perform. __________________________________________________________________________________ We have sent the following medications to your pharmacy for you to pick up at your convenience: Pepcid Pantoprazole  _______________________________________________________  If your blood pressure at your visit was 140/90 or greater, please contact your primary care physician to follow up on this.  _______________________________________________________  If you are age 50 or older, your body mass index should be between 23-30. Your Body mass index is 23.87 kg/m. If this is out of the aforementioned range listed, please consider follow up with your Primary Care Provider.  If you are age 66 or younger, your body mass index should be between 19-25. Your Body mass index is 23.87 kg/m. If this is out of the aformentioned range listed, please consider follow up with your Primary Care Provider.   ________________________________________________________  The [facility] providers would like to encourage you to use [facility] to communicate with providers for non-urgent requests or questions.  Due to long hold times on the telephone, sending your provider a message by [facility] may be a faster and more efficient way to get a response.  Please allow 48 business hours for a response.  Please remember that this is for non-urgent requests.  _______________________________________________________ It was a pleasure to see you today!  Thank you for trusting me with your gastrointestinal care!

## 2023-05-20 ENCOUNTER — Ambulatory Visit (HOSPITAL_COMMUNITY)
Admission: RE | Admit: 2023-05-20 | Discharge: 2023-05-20 | Disposition: A | Discharge status: Home / Self Care | Payer: No Typology Code available for payment source | Source: Ambulatory Visit | Attending: Gastroenterology | Admitting: Gastroenterology

## 2023-05-20 DIAGNOSIS — K219 Gastro-esophageal reflux disease without esophagitis: Secondary | ICD-10-CM | POA: Insufficient documentation

## 2023-05-20 DIAGNOSIS — R1319 Other dysphagia: Secondary | ICD-10-CM | POA: Diagnosis present

## 2023-05-24 ENCOUNTER — Other Ambulatory Visit: Payer: Self-pay | Admitting: *Deleted

## 2023-05-24 ENCOUNTER — Telehealth: Payer: Self-pay | Admitting: *Deleted

## 2023-05-24 DIAGNOSIS — K219 Gastro-esophageal reflux disease without esophagitis: Secondary | ICD-10-CM

## 2023-05-24 DIAGNOSIS — R1319 Other dysphagia: Secondary | ICD-10-CM

## 2023-05-24 NOTE — Progress Notes (Signed)
 Agree with the assessment and plan as outlined by [facility], FNP-C.  Kenneth Bottino, DO, [facility]

## 2023-05-24 NOTE — Telephone Encounter (Signed)
 I have spoken to patient and have advised of esophageal manometry 10/06/23 at 1030 am. I have given him verbalized prep instructions. He verbalizes understanding. Written instructions also made available via mychart.

## 2023-05-24 NOTE — Telephone Encounter (Signed)
-----   Message from Margarite Gouge May sent at 05/24/2023  4:33 PM EST ----- Regarding: FW: follow-up test Dottie, Can you set this patient up for a esophageal manometry. I have already given him short rundown of how this test is done and he agrees to proceed.  Deanna, NP-C ----- Message ----- From: Shellia Cleverly, DO Sent: 05/24/2023   4:27 PM EST To: Margarite Gouge May, NP Subject: RE: follow-up test                             Agree, please schedule for Esophageal Manometry for further evaluation.  Thanks. ----- Message ----- From: May, Deanna J, NP Sent: 05/20/2023   2:19 PM EST To: Shellia Cleverly, DO Subject: follow-up test                                 Dr. Barron Alvine-   Given his report would you recommend a esophageal manometry to further assess?  Jennette Kettle, NP ----- Message ----- From: Interface, Rad Results In Sent: 05/20/2023  11:55 AM EST To: Margarite Gouge May, NP

## 2023-07-05 ENCOUNTER — Ambulatory Visit: Payer: No Typology Code available for payment source | Admitting: Physical Medicine and Rehabilitation

## 2023-07-16 ENCOUNTER — Other Ambulatory Visit: Payer: Self-pay | Admitting: Gastroenterology

## 2023-07-18 ENCOUNTER — Other Ambulatory Visit: Payer: Self-pay | Admitting: Physical Medicine and Rehabilitation

## 2023-07-21 DIAGNOSIS — S14129A Central cord syndrome at unspecified level of cervical spinal cord, initial encounter: Secondary | ICD-10-CM | POA: Diagnosis not present

## 2023-07-23 DIAGNOSIS — G629 Polyneuropathy, unspecified: Secondary | ICD-10-CM | POA: Diagnosis not present

## 2023-07-23 DIAGNOSIS — I10 Essential (primary) hypertension: Secondary | ICD-10-CM | POA: Diagnosis not present

## 2023-07-23 DIAGNOSIS — F419 Anxiety disorder, unspecified: Secondary | ICD-10-CM | POA: Diagnosis not present

## 2023-07-23 DIAGNOSIS — Z809 Family history of malignant neoplasm, unspecified: Secondary | ICD-10-CM | POA: Diagnosis not present

## 2023-07-23 DIAGNOSIS — K219 Gastro-esophageal reflux disease without esophagitis: Secondary | ICD-10-CM | POA: Diagnosis not present

## 2023-07-23 DIAGNOSIS — Z8249 Family history of ischemic heart disease and other diseases of the circulatory system: Secondary | ICD-10-CM | POA: Diagnosis not present

## 2023-07-23 DIAGNOSIS — Z7901 Long term (current) use of anticoagulants: Secondary | ICD-10-CM | POA: Diagnosis not present

## 2023-07-23 DIAGNOSIS — Z9181 History of falling: Secondary | ICD-10-CM | POA: Diagnosis not present

## 2023-07-26 ENCOUNTER — Encounter: Payer: Self-pay | Admitting: Physical Medicine and Rehabilitation

## 2023-07-26 ENCOUNTER — Encounter
Payer: No Typology Code available for payment source | Attending: Physical Medicine and Rehabilitation | Admitting: Physical Medicine and Rehabilitation

## 2023-07-26 VITALS — BP 103/71 | HR 86 | Ht 72.0 in | Wt 171.0 lb

## 2023-07-26 DIAGNOSIS — M792 Neuralgia and neuritis, unspecified: Secondary | ICD-10-CM

## 2023-07-26 DIAGNOSIS — M6281 Muscle weakness (generalized): Secondary | ICD-10-CM | POA: Insufficient documentation

## 2023-07-26 DIAGNOSIS — M25511 Pain in right shoulder: Secondary | ICD-10-CM | POA: Diagnosis not present

## 2023-07-26 DIAGNOSIS — M25512 Pain in left shoulder: Secondary | ICD-10-CM | POA: Diagnosis not present

## 2023-07-26 DIAGNOSIS — G8929 Other chronic pain: Secondary | ICD-10-CM | POA: Diagnosis not present

## 2023-07-26 DIAGNOSIS — G8254 Quadriplegia, C5-C7 incomplete: Secondary | ICD-10-CM

## 2023-07-26 DIAGNOSIS — G8921 Chronic pain due to trauma: Secondary | ICD-10-CM

## 2023-07-26 DIAGNOSIS — R252 Cramp and spasm: Secondary | ICD-10-CM

## 2023-07-26 DIAGNOSIS — R269 Unspecified abnormalities of gait and mobility: Secondary | ICD-10-CM | POA: Diagnosis not present

## 2023-07-26 MED ORDER — SILDENAFIL CITRATE 100 MG PO TABS: 100.0000 mg | ORAL_TABLET | Freq: Every day | ORAL | 5 refills | Status: AC | PRN

## 2023-07-26 MED ORDER — GABAPENTIN 600 MG PO TABS: 1200.0000 mg | ORAL_TABLET | Freq: Two times a day (BID) | ORAL | 1 refills | Status: DC

## 2023-07-26 MED ORDER — BACLOFEN 10 MG PO TABS: 10.0000 mg | ORAL_TABLET | Freq: Three times a day (TID) | ORAL | 1 refills | Status: DC

## 2023-07-26 NOTE — Patient Instructions (Signed)
 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  Pt also has post op pain, nerve pain; Neurogenic bowel and bladder, HTN, and prior tobacco use;   Here for SCI f/u.     Call PCP to get them to decrease Lisinopril  to 5 mg daily- from 10 mg  OR 1/2 the hydrochlorothiazide  to 12.5 mg daily.   2. BP today 103/71- is too low- so needs to reduce BP meds, so BP running 120s-130s as the upper number!   3.  Let's Try Viagra- 100 mg daily prn- 30 minutes prior to intimacy-   if you get a Headache, call me and will try Cialis.  - retrograde ejaculation- things "different" .    4.  Off Norco- "got off it"    5. Will hit maximal improvement by ~ 12-18 months    6. Con't Baclofen  10 mg 3x/day- will send in to [facility]- 6 months supply   7. Con't Gabapentin  1200 mg 3x/day- # months supply- 1 refill    8. con't Duloxetine  120 mg daily-  last filled 07/21/23   9. F/U in 3 months-  double appt- SCI-
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
## 2023-08-03 ENCOUNTER — Ambulatory Visit: Payer: No Typology Code available for payment source | Admitting: Gastroenterology

## 2023-08-03 ENCOUNTER — Encounter: Payer: Self-pay | Admitting: Gastroenterology

## 2023-08-03 VITALS — BP 92/60 | HR 76 | Ht 72.0 in | Wt 173.0 lb

## 2023-08-03 DIAGNOSIS — K219 Gastro-esophageal reflux disease without esophagitis: Secondary | ICD-10-CM

## 2023-08-03 DIAGNOSIS — R634 Abnormal weight loss: Secondary | ICD-10-CM

## 2023-08-03 DIAGNOSIS — K224 Dyskinesia of esophagus: Secondary | ICD-10-CM

## 2023-08-03 MED ORDER — PANTOPRAZOLE SODIUM 40 MG PO TBEC: 40.0000 mg | DELAYED_RELEASE_TABLET | Freq: Two times a day (BID) | ORAL | 3 refills | Status: DC

## 2023-08-03 NOTE — Progress Notes (Signed)
 Chief Complaint: follow-up GERD and dysphagia Primary GI Doctor: Dr. Karene Oto   [id number] is a 63 year old African American male patient with past medical history of Constipation, History of chronic hepatitis C, Neurogenic bladder with urinary retention, Neurogenic bowel, Hypertension, chronic pain syndrome, who was referred to me by Dianah Fort, PA on 04/12/23 for a complaint of worsening GERD and nausea.  Last seen in office by myself on 05/13/23 for worsening GERD and nausea. Added Pepcid  at bedtime. Ordered esophagram to evaluate esophageal dysphagia. Recommend colon screening colonoscopy, declined  05/20/23 ESOPHOGRAM/BARIUM SWALLOW  IMPRESSION: *Moderate-to-severe nonspecific esophageal dysmotility. *Otherwise unremarkable study. Patient scheduled for esophageal manometry on 10/06/23.  Past medical history is significant for chronic hepatitis C; genotype 1a and is s/p Vosevi  time 12 weeks. Undetectable viral RNA 01/21/2022. Follows with ID.   Interval History    Patient has history of GERD and currently taking pantoprazole  40 mg p.o. daily and he recently added Pepcid  at bedtime for nocturnal symptoms. He states he has continued with nocturnal symptoms of regurgitation. He tried taking even 2 Pepcid  at bedtime without improvement in symptoms. He admits his appetite has decreased and has been consuming a lot of protein shakes. He has had a lot of issues with dysphagia and due for manometry in July. Answered all of his questions about the procedure and what we are looking for with the test.  Wt Readings from Last 3 Encounters:  08/03/23 173 lb (78.5 kg)  07/26/23 171 lb (77.6 kg)  05/13/23 176 lb (79.8 kg)    Past Medical History:  Diagnosis Date   Hepatitis C    Neck injury 08/18/2022    Past Surgical History:  Procedure Laterality Date   ANTERIOR CERVICAL DECOMP/DISCECTOMY FUSION N/A 08/21/2022   Procedure: ANTERIOR CERVICAL DECOMPRESSION/DISCECTOMY FUSION CERVICAL  THREE-FOUR, CERVICAL FOUR-FIVE, CERVICAL FIVE-SIX;  Surgeon: Agustina Aldrich, MD;  Location: [facility] OR;  Service: Neurosurgery;  Laterality: N/A;   PELVIC FRACTURE SURGERY Right     Current Outpatient Medications  Medication Sig Dispense Refill   baclofen  (LIORESAL ) 10 MG tablet Take 1 tablet (10 mg total) by mouth 3 (three) times daily. 270 tablet 1   DULoxetine  (CYMBALTA ) 60 MG capsule TAKE 2 CAPSULES(120 MG) BY MOUTH AT BEDTIME FOR NERVE PAIN. MAX DOSE 60 capsule 5   famotidine  (PEPCID ) 20 MG tablet TAKE 1 TABLET BY MOUTH EVERY DAY 30 tablet 1   gabapentin  (NEURONTIN ) 600 MG tablet Take 2 tablets (1,200 mg total) by mouth 2 (two) times daily. X  week, then 1200 mg/2 tabs 3x/day- for nerve pain- max dose 540 tablet 1   hydrochlorothiazide  (HYDRODIURIL ) 25 MG tablet Take 1 tablet (25 mg total) by mouth daily. 30 tablet 0   lisinopril  (ZESTRIL ) 10 MG tablet Take 1 tablet (10 mg total) by mouth daily. 30 tablet 0   methocarbamol  (ROBAXIN ) 500 MG tablet Take 500 mg by mouth 4 (four) times daily.     sildenafil  (VIAGRA ) 100 MG tablet Take 1 tablet (100 mg total) by mouth daily as needed for erectile dysfunction. 30 minutes prior to intimacy 30 tablet 5   sucralfate (CARAFATE) 1 g tablet Take 1 g by mouth 4 (four) times daily -  with meals and at bedtime.     pantoprazole  (PROTONIX ) 40 MG tablet Take 1 tablet (40 mg total) by mouth 2 (two) times daily. 30 tablet 3   No current facility-administered medications for this visit.    Allergies as of 08/03/2023   (No Known Allergies)  Family History  Problem Relation Age of Onset   Hepatitis C Brother    Colon cancer Neg Hx    Esophageal cancer Neg Hx    Liver disease Neg Hx     Review of Systems:    Constitutional: No weight loss, fever, chills, weakness or fatigue HEENT: Eyes: No change in vision               Ears, Nose, Throat:  No change in hearing or congestion Skin: No rash or itching Cardiovascular: No chest pain, chest pressure or  palpitations   Respiratory: No SOB or cough Gastrointestinal: See HPI and otherwise negative Genitourinary: No dysuria or change in urinary frequency Neurological: No headache, dizziness or syncope Musculoskeletal: No new muscle or joint pain Hematologic: No bleeding or bruising Psychiatric: No history of depression or anxiety    Physical Exam:  Vital signs: BP 92/60   Pulse 76   Ht 6' (1.829 m)   Wt 173 lb (78.5 kg)   BMI 23.46 kg/m   Constitutional:   Pleasant  A.A. male appears to be in NAD, Well developed, Well nourished, alert and cooperative Throat: Oral cavity and pharynx without inflammation, swelling or lesion.  Respiratory: Respirations even and unlabored. Lungs clear to auscultation bilaterally.   No wheezes, crackles, or rhonchi.  Cardiovascular: Normal S1, S2. Regular rate and rhythm. No peripheral edema, cyanosis or pallor.  Gastrointestinal:  Soft, nondistended, nontender. No rebound or guarding. Normal bowel sounds. No appreciable masses or hepatomegaly. Rectal:  Not performed.  Msk:  Symmetrical without gross deformities. Without edema, no deformity or joint abnormality.  Neurologic:  Alert and  oriented x4;  grossly normal neurologically.  Skin:   Dry and intact without significant lesions or rashes. Psychiatric: Oriented to person, place and time. Demonstrates good judgement and reason without abnormal affect or behaviors.  RELEVANT LABS AND IMAGING: CBC    Latest Ref Rng & Units 09/14/2022    6:06 AM 09/07/2022    5:58 AM 08/31/2022    6:24 AM  CBC  WBC 4.0 - 10.5 K/uL 6.0  5.9  9.4   Hemoglobin 13.0 - 17.0 g/dL 98.1  19.1  47.8   Hematocrit 39.0 - 52.0 % 38.3  38.2  40.2   Platelets 150 - 400 K/uL 169  163  176      CMP     Latest Ref Rng & Units 09/14/2022    6:06 AM 09/07/2022    5:58 AM 08/31/2022    6:24 AM  CMP  Glucose 70 - 99 mg/dL 96  295  621   BUN 6 - 20 mg/dL 15  23  18    Creatinine 0.61 - 1.24 mg/dL 3.08  6.57  8.46   Sodium 135 - 145  mmol/L 132  136  130   Potassium 3.5 - 5.1 mmol/L 4.1  4.0  3.5   Chloride 98 - 111 mmol/L 98  96  93   CO2 22 - 32 mmol/L 26  28  28    Calcium 8.9 - 10.3 mg/dL 9.0  9.1  9.1    9/62/9528 CT chest abdomen pelvis with contrast IMPRESSION: 1. No acute intracranial CT findings or depressed skull fractures. 2. Cervical degenerative changes without evidence of fractures or listhesis. 3. Moderate to severe spinal canal stenosis with cord compression at C3-4, C4-5, and C5-6 due to short pedicles and dorsal disc osteophyte complexes. 4. No acute trauma related findings in the chest, abdomen or pelvis. 5. 1.2 x 0.9 cm  rounded pleural-based nodule in the posterolateral base of the right lower lobe with pleurodiaphragmatic stranding. This could be a nodular scar, but is nonspecific. Consider one of the following in 3 months for both low-risk and high-risk individuals: (a) repeat chest CT, (b) follow-up PET-CT, or (c) tissue sampling. This recommendation follows the consensus statement: Guidelines for Management of Incidental Pulmonary Nodules Detected on CT Images: From the Fleischner Society 2017; Radiology 2017; 284:228-243. 6. 3 mm right upper lobe suprahilar nodule, also new. 7. Aortic and coronary artery atherosclerosis. 8. Diverticulosis without evidence of diverticulitis. 9. 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 Acuity indeterminate. 10. Emphysema. 09/10/2021 US  abdominal RUQ with elastography ULTRASOUND RUQ:  Coarsened hepatic echotexture with increased hepatic echogenicity suggestive of chronic hepatocellular disease. No suspicious hepatic lesion. ULTRASOUND HEPATIC ELASTOGRAPHY: Median kPa: 25.2 Diagnostic category: > or =17 kPa: highly suggestive of cACLD with an increased probability of clinically significant portal hypertension.  Assessment: Encounter  Diagnoses  Name Primary?   Esophageal dysmotility Yes   Loss of weight    Gastroesophageal reflux disease, unspecified whether esophagitis present       63 year old A.A. male patient with uncontrolled GERD and esophageal dysmotility found on recent esophagram. He is currently taking Pantoprazole  40 mg po daily and I added Pepcid  20 mg at bedtime for nocturnal symptoms without improvement in symptoms. Will increase the Pantoprazole  40 mg to twice daily and reinforced strict GERD diet. He is pending eso manometry in two months. Recommended soft food, pureed diet. Protein shakes three times daily.  Plan: -Increase Pantoprazole  40 mg po daily to twice daily -OTC Pepcid  20 mg po daily prn for breakthrough symptoms -Continue Carafate po daily -Reinforced GERD diet, no late meals -dietary recommendations for weight loss -Follow-up with manometry results 10/06/23    Thank you for the courtesy of this consult. Please call me with any questions or concerns.   Analayah Brooke, FNP-C [facility] 08/03/2023, 10:39 AM  Cc: Dianah Fort, PA

## 2023-08-03 NOTE — Patient Instructions (Addendum)
 Increase Pantoprazole  40 mg po daily to twice daily- 1 tablet 30-45 minutes before breakfast and dinner OTC Pepcid  20 mg po daily prn for breakthrough symptoms Esophageal manometry on 10/06/23 at 1030, please follow instructions as given to you.   Dietary Recommendations:  Recommend protein shakes three times daily  Adopt a soft textured diet. For more severe cases, a pureed or liquid diet may be needed.  Incorporate soft cooked, mashed or pureed foods; soups, smoothies and crock-pot meals (tender meats and vegetables).  Smoothies and protein shakes are especially helpful when appetite or intake is low. Sip small amounts of liquids with meals to ease swallowing and help food slide down the esophagus.  Enjoy room temperature or warm liquids. Avoid ice cold drinks which can cause muscle spasms. Add sauces and gravies to moisten food.  Take small bites, chew food thoroughly and limit stressful distractions at meal times.  Do not go to bed immediately after a meal. Allow about three hours after eating before laying down to prevent regurgitation and heartburn.  _______________________________________________________  If your blood pressure at your visit was 140/90 or greater, please contact your primary care physician to follow up on this.  _______________________________________________________  If you are age 63 or older, your body mass index should be between 23-30. Your Body mass index is 23.46 kg/m. If this is out of the aforementioned range listed, please consider follow up with your Primary Care Provider.  If you are age 76 or younger, your body mass index should be between 19-25. Your Body mass index is 23.46 kg/m. If this is out of the aformentioned range listed, please consider follow up with your Primary Care Provider.   ________________________________________________________  The [facility] providers would like to encourage you to use MYCHART to communicate with providers  for non-urgent requests or questions.  Due to long hold times on the telephone, sending your provider a message by [facility] may be a faster and more efficient way to get a response.  Please allow 48 business hours for a response.  Please remember that this is for non-urgent requests.  _______________________________________________________ Thank you for trusting me with your gastrointestinal care. Deanna May, RNP

## 2023-08-04 NOTE — Progress Notes (Signed)
 Agree with the assessment and plan as outlined by [facility], FNP-C.  Carlitos Bottino, DO, [facility]

## 2023-08-05 DIAGNOSIS — B1920 Unspecified viral hepatitis C without hepatic coma: Secondary | ICD-10-CM | POA: Diagnosis not present

## 2023-08-05 DIAGNOSIS — I10 Essential (primary) hypertension: Secondary | ICD-10-CM | POA: Diagnosis not present

## 2023-08-05 DIAGNOSIS — K219 Gastro-esophageal reflux disease without esophagitis: Secondary | ICD-10-CM | POA: Diagnosis not present

## 2023-08-05 DIAGNOSIS — R202 Paresthesia of skin: Secondary | ICD-10-CM | POA: Diagnosis not present

## 2023-08-05 DIAGNOSIS — R42 Dizziness and giddiness: Secondary | ICD-10-CM | POA: Diagnosis not present

## 2023-08-05 DIAGNOSIS — N528 Other male erectile dysfunction: Secondary | ICD-10-CM | POA: Diagnosis not present

## 2023-08-19 ENCOUNTER — Telehealth: Payer: Self-pay | Admitting: Gastroenterology

## 2023-08-19 NOTE — Telephone Encounter (Signed)
 Inbound call from patient, states he would like to schedule his colonoscopy along with the manometry, patient states he declined at the time of his appointment but would now like to schedule. Please advise.

## 2023-09-03 ENCOUNTER — Emergency Department (HOSPITAL_COMMUNITY)
Admission: EM | Admit: 2023-09-03 | Discharge: 2023-09-03 | Disposition: A | Discharge status: Home / Self Care | Attending: Emergency Medicine | Admitting: Emergency Medicine

## 2023-09-03 ENCOUNTER — Emergency Department (HOSPITAL_COMMUNITY)

## 2023-09-03 DIAGNOSIS — Z7901 Long term (current) use of anticoagulants: Secondary | ICD-10-CM | POA: Insufficient documentation

## 2023-09-03 DIAGNOSIS — R519 Headache, unspecified: Secondary | ICD-10-CM | POA: Diagnosis not present

## 2023-09-03 DIAGNOSIS — T300 Burn of unspecified body region, unspecified degree: Secondary | ICD-10-CM | POA: Diagnosis not present

## 2023-09-03 DIAGNOSIS — M25519 Pain in unspecified shoulder: Secondary | ICD-10-CM | POA: Diagnosis not present

## 2023-09-03 DIAGNOSIS — I878 Other specified disorders of veins: Secondary | ICD-10-CM | POA: Diagnosis not present

## 2023-09-03 DIAGNOSIS — M47812 Spondylosis without myelopathy or radiculopathy, cervical region: Secondary | ICD-10-CM | POA: Diagnosis not present

## 2023-09-03 DIAGNOSIS — I700 Atherosclerosis of aorta: Secondary | ICD-10-CM | POA: Diagnosis not present

## 2023-09-03 DIAGNOSIS — S0990XA Unspecified injury of head, initial encounter: Secondary | ICD-10-CM | POA: Insufficient documentation

## 2023-09-03 DIAGNOSIS — S3991XA Unspecified injury of abdomen, initial encounter: Secondary | ICD-10-CM | POA: Insufficient documentation

## 2023-09-03 DIAGNOSIS — Z981 Arthrodesis status: Secondary | ICD-10-CM | POA: Diagnosis not present

## 2023-09-03 DIAGNOSIS — M542 Cervicalgia: Secondary | ICD-10-CM | POA: Diagnosis not present

## 2023-09-03 DIAGNOSIS — S299XXA Unspecified injury of thorax, initial encounter: Secondary | ICD-10-CM | POA: Diagnosis not present

## 2023-09-03 DIAGNOSIS — Y92410 Unspecified street and highway as the place of occurrence of the external cause: Secondary | ICD-10-CM | POA: Insufficient documentation

## 2023-09-03 DIAGNOSIS — S3993XA Unspecified injury of pelvis, initial encounter: Secondary | ICD-10-CM | POA: Diagnosis not present

## 2023-09-03 DIAGNOSIS — S161XXA Strain of muscle, fascia and tendon at neck level, initial encounter: Secondary | ICD-10-CM | POA: Diagnosis not present

## 2023-09-03 DIAGNOSIS — R0781 Pleurodynia: Secondary | ICD-10-CM | POA: Diagnosis not present

## 2023-09-03 DIAGNOSIS — J9811 Atelectasis: Secondary | ICD-10-CM | POA: Diagnosis not present

## 2023-09-03 DIAGNOSIS — S2232XA Fracture of one rib, left side, initial encounter for closed fracture: Secondary | ICD-10-CM | POA: Diagnosis not present

## 2023-09-03 DIAGNOSIS — Z0189 Encounter for other specified special examinations: Secondary | ICD-10-CM | POA: Diagnosis not present

## 2023-09-03 DIAGNOSIS — M4322 Fusion of spine, cervical region: Secondary | ICD-10-CM | POA: Diagnosis not present

## 2023-09-03 DIAGNOSIS — S199XXA Unspecified injury of neck, initial encounter: Secondary | ICD-10-CM | POA: Diagnosis not present

## 2023-09-03 LAB — I-STAT CHEM 8, ED
BUN: 7 mg/dL (ref 6–20)
Calcium, Ion: 1.06 mmol/L — ABNORMAL LOW (ref 1.15–1.40)
Chloride: 106 mmol/L (ref 98–111)
Creatinine, Ser: 0.9 mg/dL (ref 0.61–1.24)
Glucose, Bld: 94 mg/dL (ref 70–99)
HCT: 48 % (ref 39.0–52.0)
Hemoglobin: 16.3 g/dL (ref 13.0–17.0)
Potassium: 3.7 mmol/L (ref 3.5–5.1)
Sodium: 141 mmol/L (ref 135–145)
TCO2: 23 mmol/L (ref 22–32)

## 2023-09-03 LAB — COMPREHENSIVE METABOLIC PANEL WITH GFR
ALT: 9 U/L (ref 0–44)
AST: 20 U/L (ref 15–41)
Albumin: 3.9 g/dL (ref 3.5–5.0)
Alkaline Phosphatase: 57 U/L (ref 38–126)
Anion gap: 13 (ref 5–15)
BUN: 7 mg/dL (ref 6–20)
CO2: 21 mmol/L — ABNORMAL LOW (ref 22–32)
Calcium: 9.1 mg/dL (ref 8.9–10.3)
Chloride: 106 mmol/L (ref 98–111)
Creatinine, Ser: 0.91 mg/dL (ref 0.61–1.24)
GFR, Estimated: >60 mL/min (ref >60)
Glucose, Bld: 95 mg/dL (ref 70–99)
Potassium: 3.9 mmol/L (ref 3.5–5.1)
Sodium: 140 mmol/L (ref 135–145)
Total Bilirubin: 0.7 mg/dL (ref 0.0–1.2)
Total Protein: 7.2 g/dL (ref 6.5–8.1)

## 2023-09-03 LAB — URINALYSIS, ROUTINE W REFLEX MICROSCOPIC
Bilirubin Urine: NEGATIVE
Glucose, UA: NEGATIVE mg/dL
Hgb urine dipstick: NEGATIVE
Ketones, ur: NEGATIVE mg/dL
Nitrite: NEGATIVE
Protein, ur: NEGATIVE mg/dL
Specific Gravity, Urine: 1.01 (ref 1.005–1.030)
pH: 6 (ref 5.0–8.0)

## 2023-09-03 LAB — ETHANOL: Alcohol, Ethyl (B): <15 mg/dL (ref <15)

## 2023-09-03 LAB — SAMPLE TO BLOOD BANK

## 2023-09-03 LAB — CBC
HCT: 47.3 % (ref 39.0–52.0)
Hemoglobin: 16.6 g/dL (ref 13.0–17.0)
MCH: 33.8 pg (ref 26.0–34.0)
MCHC: 35.1 g/dL (ref 30.0–36.0)
MCV: 96.3 fL (ref 80.0–100.0)
Platelets: 114 10*3/uL — ABNORMAL LOW (ref 150–400)
RBC: 4.91 MIL/uL (ref 4.22–5.81)
RDW: 13.1 % (ref 11.5–15.5)
WBC: 11.4 10*3/uL — ABNORMAL HIGH (ref 4.0–10.5)
nRBC: 0 % (ref 0.0–0.2)

## 2023-09-03 LAB — PROTIME-INR
INR: 1.1 (ref 0.8–1.2)
Prothrombin Time: 14.4 s (ref 11.4–15.2)

## 2023-09-03 LAB — I-STAT CG4 LACTIC ACID, ED: Lactic Acid, Venous: 1.6 mmol/L (ref 0.5–1.9)

## 2023-09-03 MED ORDER — OXYCODONE-ACETAMINOPHEN 5-325 MG PO TABS: 1.0000 | ORAL_TABLET | Freq: Four times a day (QID) | ORAL | 0 refills | Status: DC | PRN

## 2023-09-03 MED ORDER — OXYCODONE-ACETAMINOPHEN 5-325 MG PO TABS
1.0000 | ORAL_TABLET | Freq: Once | ORAL | Status: AC
  Administered 2023-09-03: 1 via ORAL
  Filled 2023-09-03: qty 1

## 2023-09-03 MED ORDER — LIDOCAINE 5 % EX PTCH: 1.0000 | MEDICATED_PATCH | CUTANEOUS | 0 refills | Status: AC

## 2023-09-03 MED ORDER — IOHEXOL 350 MG/ML SOLN
75.0000 mL | Freq: Once | INTRAVENOUS | Status: AC | PRN
  Administered 2023-09-03: 75 mL via INTRAVENOUS

## 2023-09-03 MED ORDER — FENTANYL CITRATE PF 50 MCG/ML IJ SOSY
50.0000 ug | PREFILLED_SYRINGE | Freq: Once | INTRAMUSCULAR | Status: AC
  Administered 2023-09-03: 50 ug via INTRAVENOUS
  Filled 2023-09-03: qty 1
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
## 2023-09-03 NOTE — ED Notes (Signed)
 Patient transported to CT scan .
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
## 2023-09-03 NOTE — ED Notes (Signed)
 Patient given incentive spirometry.
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
## 2023-09-03 NOTE — ED Notes (Signed)
 1st lac 1.6 within normal range, 2nd not needed can be discontinued

## 2023-09-03 NOTE — ED Notes (Signed)
 Recollected type and screen...KM
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
## 2023-09-08 DIAGNOSIS — R42 Dizziness and giddiness: Secondary | ICD-10-CM | POA: Diagnosis not present

## 2023-09-08 DIAGNOSIS — K219 Gastro-esophageal reflux disease without esophagitis: Secondary | ICD-10-CM | POA: Diagnosis not present

## 2023-09-08 DIAGNOSIS — R202 Paresthesia of skin: Secondary | ICD-10-CM | POA: Diagnosis not present

## 2023-09-08 DIAGNOSIS — B1920 Unspecified viral hepatitis C without hepatic coma: Secondary | ICD-10-CM | POA: Diagnosis not present

## 2023-09-08 DIAGNOSIS — I10 Essential (primary) hypertension: Secondary | ICD-10-CM | POA: Diagnosis not present

## 2023-09-09 ENCOUNTER — Telehealth: Payer: Self-pay | Admitting: *Deleted

## 2023-09-09 ENCOUNTER — Telehealth: Payer: Self-pay | Admitting: Physical Medicine and Rehabilitation

## 2023-09-09 ENCOUNTER — Telehealth: Payer: Self-pay

## 2023-09-09 DIAGNOSIS — G8254 Quadriplegia, C5-C7 incomplete: Secondary | ICD-10-CM
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
## 2023-09-09 NOTE — Telephone Encounter (Signed)
 He will have to see Emilia Harbour and have a swab done. I have notified April to schedule him with Emilia Harbour.

## 2023-09-09 NOTE — Progress Notes (Signed)
 Complex Care Management Note  Care Guide Note 09/09/2023 Name: Frisco Cordts MRN: [id number] DOB: [birth year]  Nickholas Goldston is a 63 y.o. year old male who sees Dianah Fort, [place] for primary care. I reached out to Sabina Craft by phone today to offer complex care management services.  Mr. Tammen was given information about Complex Care Management services today including:   The Complex Care Management services include support from the care team which includes your Nurse Care Manager, Clinical Social Worker, or Pharmacist.  The Complex Care Management team is here to help remove barriers to the health concerns and goals most important to you. Complex Care Management services are voluntary, and the patient may decline or stop services at any time by request to their care team member.   Complex Care Management Consent Status: Patient agreed to services and verbal consent obtained.   Follow up plan:  Telephone appointment with complex care management team member scheduled for:  09/20/2023  Encounter Outcome:  Patient Scheduled  Kandis Ormond, CMA [place]  [facility], [facility] Guide Direct Dial: [phone]  Fax: [phone] Website: [email or web address]

## 2023-09-10 ENCOUNTER — Encounter: Attending: Physical Medicine and Rehabilitation | Admitting: Registered Nurse

## 2023-09-10 DIAGNOSIS — Z79891 Long term (current) use of opiate analgesic: Secondary | ICD-10-CM | POA: Insufficient documentation

## 2023-09-10 DIAGNOSIS — M792 Neuralgia and neuritis, unspecified: Secondary | ICD-10-CM | POA: Insufficient documentation

## 2023-09-10 DIAGNOSIS — Z79899 Other long term (current) drug therapy: Secondary | ICD-10-CM | POA: Insufficient documentation

## 2023-09-10 DIAGNOSIS — G8921 Chronic pain due to trauma: Secondary | ICD-10-CM | POA: Insufficient documentation

## 2023-09-10 DIAGNOSIS — G894 Chronic pain syndrome: Secondary | ICD-10-CM | POA: Insufficient documentation

## 2023-09-20 ENCOUNTER — Other Ambulatory Visit: Payer: Self-pay | Admitting: Licensed Clinical Social Worker
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
## 2023-09-20 NOTE — Patient Instructions (Signed)
 Visit Information  Thank you for taking time to visit with me today. Please don't hesitate to contact me if I can be of assistance to you before our next scheduled appointment.  Our next appointment is by telephone on 07/14 at 11:30 AM Please call the care guide team at [phone] if you need to cancel or reschedule your appointment.   Following is a copy of your care plan:   Goals Addressed             This Visit's Progress    LCSW VBCI Social Work Care Plan   On track    Problems:   Disease Management support and education needs related to Adjustment Disorder with Depressed Mood  CSW Clinical Goal(s):   Over the next 90 days the Patient will attend all scheduled medical appointments as evidenced by patient report and care team review of appointment completion in electronic MEDICAL RECORD NUMBER  demonstrate a reduction in symptoms related to Adjustment Disorder with Depressed Mood .  Interventions:  Mental Health:  Evaluation of current treatment plan related to Adjustment Disorder with Depressed Mood Active listening / Reflection utilized Financial risk analyst / information provided Emotional Support Provided Mindfulness or Relaxation training provided Provided general psycho-education for mental health needs Solution-Focued Strategies employed:  Patient Goals/Self-Care Activities:  Continue taking your medication as prescribed.   Increase coping skills, healthy habits, and stress reduction Follow up with [facility] to schedule a sooner appt to address pain  Plan:   Telephone follow up appointment with care management team member scheduled for:  2-4 weeks        Please call the Suicide and Crisis Lifeline: 988 go to [facility] [address], [place] [phone]) call 911 if you are experiencing a Mental Health or Behavioral Health Crisis or need someone to talk to.  Patient  verbalizes understanding of instructions and care plan provided today and agrees to view in MyChart. Active MyChart status and patient understanding of how to access instructions and care plan via MyChart confirmed with patient.     Rolin Ezzard HUGHS [facility]  [facility], [facility] Clinical Social Worker Direct Dial: [phone]  Fax: [phone] Website: [email or web address] 12:04 PM

## 2023-09-23 ENCOUNTER — Encounter: Attending: Registered Nurse | Admitting: Registered Nurse

## 2023-09-23 ENCOUNTER — Encounter: Payer: Self-pay | Admitting: Registered Nurse

## 2023-09-23 VITALS — BP 138/93 | HR 59 | Ht 72.0 in | Wt 170.6 lb

## 2023-09-23 DIAGNOSIS — M25551 Pain in right hip: Secondary | ICD-10-CM | POA: Diagnosis not present

## 2023-09-23 DIAGNOSIS — M25512 Pain in left shoulder: Secondary | ICD-10-CM | POA: Diagnosis not present

## 2023-09-23 DIAGNOSIS — R52 Pain, unspecified: Secondary | ICD-10-CM

## 2023-09-23 DIAGNOSIS — M5416 Radiculopathy, lumbar region: Secondary | ICD-10-CM | POA: Diagnosis not present

## 2023-09-23 DIAGNOSIS — M62838 Other muscle spasm: Secondary | ICD-10-CM | POA: Insufficient documentation

## 2023-09-23 DIAGNOSIS — M25552 Pain in left hip: Secondary | ICD-10-CM | POA: Insufficient documentation

## 2023-09-23 DIAGNOSIS — Z79899 Other long term (current) drug therapy: Secondary | ICD-10-CM | POA: Diagnosis not present

## 2023-09-23 DIAGNOSIS — G8254 Quadriplegia, C5-C7 incomplete: Secondary | ICD-10-CM | POA: Diagnosis not present

## 2023-09-23 DIAGNOSIS — R252 Cramp and spasm: Secondary | ICD-10-CM | POA: Diagnosis not present

## 2023-09-23 DIAGNOSIS — G894 Chronic pain syndrome: Secondary | ICD-10-CM | POA: Diagnosis not present

## 2023-09-23 DIAGNOSIS — Z5181 Encounter for therapeutic drug level monitoring: Secondary | ICD-10-CM | POA: Insufficient documentation

## 2023-09-23 MED ORDER — JOURNAVX 50 MG PO TABS: 1.0000 | ORAL_TABLET | Freq: Every day | ORAL | 1 refills | Status: AC | PRN
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
## 2023-09-28 LAB — DRUG TOX MONITOR 1 W/CONF, ORAL FLD
Amphetamines: NEGATIVE ng/mL (ref <10)
Barbiturates: NEGATIVE ng/mL (ref <10)
Benzodiazepines: NEGATIVE ng/mL (ref <0.50)
Benzoylecgonine: NEGATIVE ng/mL (ref <5.0)
Buprenorphine: NEGATIVE ng/mL (ref <0.10)
Cocaine: 12.2 ng/mL — ABNORMAL HIGH (ref <5.0)
Cocaine: POSITIVE ng/mL — AB (ref <5.0)
Cotinine: >250 ng/mL — ABNORMAL HIGH (ref <5.0)
Fentanyl: NEGATIVE ng/mL (ref <0.10)
Heroin Metabolite: NEGATIVE ng/mL (ref <1.0)
MARIJUANA: POSITIVE ng/mL — AB (ref <2.5)
MDMA: NEGATIVE ng/mL (ref <10)
Meprobamate: NEGATIVE ng/mL (ref <2.5)
Methadone: NEGATIVE ng/mL (ref <5.0)
Nicotine Metabolite: POSITIVE ng/mL — AB (ref <5.0)
Opiates: NEGATIVE ng/mL (ref <2.5)
Phencyclidine: NEGATIVE ng/mL (ref <10)
THC: 91.3 ng/mL — ABNORMAL HIGH (ref <2.5)
Tapentadol: NEGATIVE ng/mL (ref <5.0)
Tramadol: NEGATIVE ng/mL (ref <5.0)
Zolpidem: NEGATIVE ng/mL (ref <5.0)

## 2023-09-28 LAB — DRUG TOX ALC METAB W/CON, ORAL FLD: Alcohol Metabolite: NEGATIVE ng/mL (ref <25)

## 2023-09-29 ENCOUNTER — Telehealth: Payer: Self-pay | Admitting: Registered Nurse

## 2023-09-29 NOTE — Telephone Encounter (Signed)
 Oral Swab Results was reviewed.  + Cocaine  and + Marijuana Call placed to Kenneth Flores, he states he smokes marijuana doesn't use cocaine. Will ask Sybil to call [facility] to see if they have already ran the swab twice. He verbalizes understanding.  Dr Cornelio made aware of the above results, she will keep Kenneth Flores as a patient, won't be prescribing any narcotics, Kenneth Flores is aware of the above,

## 2023-10-04 ENCOUNTER — Other Ambulatory Visit: Payer: Self-pay

## 2023-10-04 ENCOUNTER — Other Ambulatory Visit: Payer: Self-pay | Admitting: Licensed Clinical Social Worker

## 2023-10-04 NOTE — Patient Instructions (Signed)
 Visit Information  Thank you for taking time to visit with me today. Please don't hesitate to contact me if I can be of assistance to you before our next scheduled appointment.  Your next care management appointment is by telephone on 8/20 at 11:30 AM  Please call the care guide team at [phone] if you need to cancel, schedule, or reschedule an appointment.   Please call the Suicide and Crisis Lifeline: 988 go to [facility] [address], [place] [phone]) call 911 if you are experiencing a Mental Health or Behavioral Health Crisis or need someone to talk to.  Rolin Kerns, LCSW [place]  [facility], [facility] Clinical Social Worker Direct Dial: [phone]  Fax: [phone] Website: [email or web address] 1:56 PM

## 2023-10-04 NOTE — Patient Outreach (Signed)
 Complex Care Management   Visit Note  10/04/2023  Name:  Kenneth Flores MRN: [id number] DOB: December 29, 1960  Situation: Referral received for Complex Care Management related to Mental/Behavioral Health diagnosis Adjustment Disorder with depressed mood I obtained verbal consent from Patient.  Visit completed with pt  on the phone  Background:   Past Medical History:  Diagnosis Date   Hepatitis C    Neck injury 08/18/2022    Assessment: Patient Reported Symptoms:  Cognitive Cognitive Status: Normal speech and language skills, Alert and oriented to person, place, and time      Neurological Neurological Review of Symptoms: No symptoms reported    HEENT HEENT Symptoms Reported: No symptoms reported      Cardiovascular Cardiovascular Symptoms Reported: Not assessed    Respiratory Respiratory Symptoms Reported: Not assesed    Endocrine Endocrine Symptoms Reported: Not assessed    Gastrointestinal Gastrointestinal Symptoms Reported: Not assessed      Genitourinary Genitourinary Symptoms Reported: Not assessed    Integumentary Integumentary Symptoms Reported: Not assessed    Musculoskeletal Musculoskelatal Symptoms Reviewed: No symptoms reported Additional Musculoskeletal Details: Patient reports that he is managing pain well, recently completed f/up appt with physical med rehab Musculoskeletal Management Strategies: Coping strategies, Routine screening      Psychosocial Psychosocial Symptoms Reported: Report of significant loss, deaths, abandonment, traumatic incidents, No symptoms reported Behavioral Management Strategies: Coping strategies          07/26/2023    2:37 PM  Depression screen PHQ 2/9  Decreased Interest 0  Down, Depressed, Hopeless 0  PHQ - 2 Score 0    There were no vitals filed for this visit.  Medications Reviewed Today     Reviewed by Omer Puccinelli D, LCSW (Social Worker) on 10/04/23 at 1350  Med List Status: <None>   Medication Order Taking? Sig  Documenting Provider Last Dose Status Informant  baclofen  (LIORESAL ) 10 MG tablet [id number]  Take 1 tablet (10 mg total) by mouth 3 (three) times daily. Lovorn, Megan, MD  Active   DULoxetine  (CYMBALTA ) 60 MG capsule [id number]  TAKE 2 CAPSULES(120 MG) BY MOUTH AT BEDTIME FOR NERVE PAIN. MAX DOSE Lovorn, Megan, MD  Active   famotidine  (PEPCID ) 20 MG tablet [id number]  TAKE 1 TABLET BY MOUTH EVERY DAY May, Deanna J, NP  Active   gabapentin  (NEURONTIN ) 600 MG tablet [id number]  Take 2 tablets (1,200 mg total) by mouth 2 (two) times daily. X  week, then 1200 mg/2 tabs 3x/day- for nerve pain- max dose Lovorn, Megan, MD  Active   hydrochlorothiazide  (HYDRODIURIL ) 25 MG tablet [id number]  Take 1 tablet (25 mg total) by mouth daily. Setzer, Nena PARAS, PA-C  Active   lidocaine  (LIDODERM ) 5 % [id number]  Place 1 patch onto the skin daily. Remove & Discard patch within 12 hours or as directed by MD Towana Ozell BROCKS, MD  Active   lisinopril  (ZESTRIL ) 10 MG tablet [id number]  Take 1 tablet (10 mg total) by mouth daily. Lovorn, Megan, MD  Active   methocarbamol  (ROBAXIN ) 500 MG tablet [id number]  Take 500 mg by mouth 4 (four) times daily. [provider]  Active   pantoprazole  (PROTONIX ) 40 MG tablet [id number]  Take 1 tablet (40 mg total) by mouth 2 (two) times daily. May, Deanna J, NP  Active   sildenafil  (VIAGRA ) 100 MG tablet [id number]  Take 1 tablet (100 mg total) by mouth daily as needed for erectile dysfunction. 30 minutes prior to intimacy Lovorn, Megan, MD  Active   sucralfate (  CARAFATE) 1 g tablet [id number]  Take 1 g by mouth 4 (four) times daily -  with meals and at bedtime. [provider]  Active   Suzetrigine  (JOURNAVX ) 50 MG TABS [id number]  Take 1 tablet by mouth daily as needed. Debby Fidela CROME, NP  Active             Recommendation:   Continue Current Plan of Care  Follow Up Plan:   Telephone follow-up in 1 month  Rolin Kerns, LCSW [facility]  [facility],  [facility] Clinical Social Worker Direct Dial: [phone]  Fax: [phone] Website: [email or web address] 1:55 PM

## 2023-10-05 ENCOUNTER — Other Ambulatory Visit: Payer: Self-pay

## 2023-10-06 ENCOUNTER — Ambulatory Visit (HOSPITAL_COMMUNITY)
Admission: RE | Admit: 2023-10-06 | Discharge: 2023-10-06 | Disposition: A | Discharge status: Home / Self Care | Payer: Self-pay | Attending: Gastroenterology | Admitting: Gastroenterology

## 2023-10-06 ENCOUNTER — Encounter (HOSPITAL_COMMUNITY)
Admission: RE | Disposition: A | Discharge status: Home / Self Care | Payer: Self-pay | Source: Home / Self Care | Attending: Gastroenterology

## 2023-10-06 ENCOUNTER — Ambulatory Visit (HOSPITAL_COMMUNITY)
Admission: RE | Admit: 2023-10-06 | Discharge: 2023-10-06 | Disposition: A | Discharge status: Home / Self Care | Source: Ambulatory Visit | Attending: Registered Nurse

## 2023-10-06 ENCOUNTER — Encounter (HOSPITAL_COMMUNITY): Payer: Self-pay | Admitting: Gastroenterology

## 2023-10-06 DIAGNOSIS — M25552 Pain in left hip: Secondary | ICD-10-CM | POA: Diagnosis not present

## 2023-10-06 DIAGNOSIS — R1310 Dysphagia, unspecified: Secondary | ICD-10-CM | POA: Diagnosis not present

## 2023-10-06 DIAGNOSIS — R12 Heartburn: Secondary | ICD-10-CM

## 2023-10-06 DIAGNOSIS — M25551 Pain in right hip: Secondary | ICD-10-CM | POA: Diagnosis not present

## 2023-10-06 DIAGNOSIS — K219 Gastro-esophageal reflux disease without esophagitis: Secondary | ICD-10-CM | POA: Insufficient documentation

## 2023-10-06 DIAGNOSIS — K224 Dyskinesia of esophagus: Secondary | ICD-10-CM

## 2023-10-06 HISTORY — PX: ESOPHAGEAL MANOMETRY: SHX5429

## 2023-10-06 SURGERY — MANOMETRY, ESOPHAGUS
Anesthesia: Choice

## 2023-10-06 MED ORDER — LIDOCAINE VISCOUS HCL 2 % MT SOLN
OROMUCOSAL | Status: AC
  Filled 2023-10-06: qty 15

## 2023-10-06 SURGICAL SUPPLY — 2 items
FACESHIELD LNG OPTICON STERILE (SAFETY) IMPLANT
GLOVE BIO SURGEON STRL SZ8 (GLOVE) ×2 IMPLANT

## 2023-10-06 NOTE — Progress Notes (Signed)
 Esophageal Manometry done per protocol. Patient tolerated well without distress or complication.

## 2023-10-07 ENCOUNTER — Encounter (HOSPITAL_COMMUNITY): Payer: Self-pay | Admitting: Gastroenterology

## 2023-10-17 ENCOUNTER — Other Ambulatory Visit: Payer: Self-pay | Admitting: Gastroenterology

## 2023-10-25 ENCOUNTER — Ambulatory Visit: Payer: Self-pay | Admitting: Gastroenterology

## 2023-10-26 MED ORDER — FAMOTIDINE 20 MG PO TABS: 20.0000 mg | ORAL_TABLET | Freq: Every day | ORAL | 3 refills | Status: DC

## 2023-11-08 ENCOUNTER — Encounter: Payer: Self-pay | Admitting: Physical Medicine and Rehabilitation

## 2023-11-08 ENCOUNTER — Encounter: Attending: Registered Nurse | Admitting: Physical Medicine and Rehabilitation

## 2023-11-08 VITALS — BP 126/84 | HR 82 | Wt 160.0 lb

## 2023-11-08 DIAGNOSIS — S14129S Central cord syndrome at unspecified level of cervical spinal cord, sequela: Secondary | ICD-10-CM | POA: Insufficient documentation

## 2023-11-08 DIAGNOSIS — R1310 Dysphagia, unspecified: Secondary | ICD-10-CM | POA: Diagnosis not present

## 2023-11-08 DIAGNOSIS — M4800 Spinal stenosis, site unspecified: Secondary | ICD-10-CM | POA: Diagnosis not present

## 2023-11-08 DIAGNOSIS — M62838 Other muscle spasm: Secondary | ICD-10-CM | POA: Diagnosis not present

## 2023-11-08 DIAGNOSIS — N319 Neuromuscular dysfunction of bladder, unspecified: Secondary | ICD-10-CM | POA: Insufficient documentation

## 2023-11-08 DIAGNOSIS — N528 Other male erectile dysfunction: Secondary | ICD-10-CM | POA: Diagnosis not present

## 2023-11-08 MED ORDER — GABAPENTIN 600 MG PO TABS: 1200.0000 mg | ORAL_TABLET | Freq: Two times a day (BID) | ORAL | 1 refills | Status: AC

## 2023-11-08 MED ORDER — DULOXETINE HCL 60 MG PO CPEP: 120.0000 mg | ORAL_CAPSULE | Freq: Every day | ORAL | 5 refills | Status: AC

## 2023-11-08 MED ORDER — BACLOFEN 10 MG PO TABS: 10.0000 mg | ORAL_TABLET | Freq: Three times a day (TID) | ORAL | 1 refills | Status: AC
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
## 2023-11-10 ENCOUNTER — Other Ambulatory Visit: Payer: Self-pay | Admitting: Licensed Clinical Social Worker

## 2023-11-10 NOTE — Patient Outreach (Signed)
 Complex Care Management   Visit Note  11/10/2023  Name:  Kenneth Flores MRN: [id number] DOB: 02-12-1961  Situation: Referral received for Complex Care Management related to Mental/Behavioral Health diagnosis Adjustment Disorder I obtained verbal consent from Patient.  Visit completed with Patient  on the phone  Background:   Past Medical History:  Diagnosis Date   Hepatitis C    Neck injury 08/18/2022    Assessment: Patient Reported Symptoms:  Cognitive Cognitive Status: No symptoms reported, Alert and oriented to person, place, and time, Normal speech and language skills Cognitive/Intellectual Conditions Management [RPT]: None reported or documented in medical history or problem list   Health Maintenance Behaviors: Annual physical exam  Neurological Neurological Review of Symptoms: No symptoms reported    HEENT HEENT Symptoms Reported: No symptoms reported      Cardiovascular Cardiovascular Symptoms Reported: Not assessed    Respiratory Respiratory Symptoms Reported: Not assesed    Endocrine Endocrine Symptoms Reported: Not assessed    Gastrointestinal Gastrointestinal Symptoms Reported: Not assessed      Genitourinary Genitourinary Symptoms Reported: Not assessed    Integumentary Integumentary Symptoms Reported: Not assessed    Musculoskeletal Musculoskelatal Symptoms Reviewed: Back pain, Muscle pain Additional Musculoskeletal Details: Pt continues to follow physical medicine and rehab, last appt was 08/18 Musculoskeletal Management Strategies: Coping strategies, Medication therapy, Routine screening      Psychosocial Psychosocial Symptoms Reported: No symptoms reported Additional Psychological Details: Patient denies any current depression or anxiety symptoms Behavioral Management Strategies: Coping strategies Major Change/Loss/Stressor/Fears (CP): Medical condition, self, Traumatic event Techniques to Cope with Loss/Stress/Change: Diversional activities, Exercise,  Medication Quality of Family Relationships: involved, supportive, helpful Do you feel physically threatened by others?: No    11/10/2023    PHQ2-9 Depression Screening   Little interest or pleasure in doing things    Feeling down, depressed, or hopeless    PHQ-2 - Total Score    Trouble falling or staying asleep, or sleeping too much    Feeling tired or having little energy    Poor appetite or overeating     Feeling bad about yourself - or that you are a failure or have let yourself or your family down    Trouble concentrating on things, such as reading the newspaper or watching television    Moving or speaking so slowly that other people could have noticed.  Or the opposite - being so fidgety or restless that you have been moving around a lot more than usual    Thoughts that you would be better off dead, or hurting yourself in some way    PHQ2-9 Total Score    If you checked off any problems, how difficult have these problems made it for you to do your work, take care of things at home, or get along with other people    Depression Interventions/Treatment      There were no vitals filed for this visit.  Medications Reviewed Today     Reviewed by Ezzard Rolin BIRCH, LCSW (Social Worker) on 11/10/23 at 1436  Med List Status: <None>   Medication Order Taking? Sig Documenting Provider Last Dose Status Informant  baclofen  (LIORESAL ) 10 MG tablet [id number]  Take 1 tablet (10 mg total) by mouth 3 (three) times daily. Lovorn, Megan, MD  Active   DULoxetine  (CYMBALTA ) 60 MG capsule [id number]  Take 2 capsules (120 mg total) by mouth at bedtime. Make sure you fill THIS Rx- not old 30 mg Rx- thanks- for nerve pain with gabapentin  Lovorn, Megan,  MD  Active   ELIQUIS  2.5 MG TABS tablet [id number]  Take by mouth. [provider]  Active   famotidine  (PEPCID ) 20 MG tablet [id number]  Take 1 tablet (20 mg total) by mouth at bedtime. Cirigliano, Vito V, DO  Active   gabapentin  (NEURONTIN ) 600 MG  tablet [id number]  Take 2 tablets (1,200 mg total) by mouth 2 (two) times daily. X  week, then 1200 mg/2 tabs 3x/day- for nerve pain- max dose Lovorn, Megan, MD  Active   lidocaine  (LIDODERM ) 5 % [id number]  Place 1 patch onto the skin daily. Remove & Discard patch within 12 hours or as directed by MD Towana Ozell BROCKS, MD  Active   methocarbamol  (ROBAXIN ) 500 MG tablet [id number]  Take 500 mg by mouth 4 (four) times daily. [provider]  Active   omeprazole (PRILOSEC) 40 MG capsule [id number]  1 cap(s) orally once a day for 30 days [provider]  Active   pantoprazole  (PROTONIX ) 40 MG tablet [id number]  1 tab(s) orally once a day for 90 days [provider]  Active   sildenafil  (VIAGRA ) 100 MG tablet [id number]  Take 1 tablet (100 mg total) by mouth daily as needed for erectile dysfunction. 30 minutes prior to intimacy Lovorn, Megan, MD  Active   sucralfate (CARAFATE) 1 g tablet [id number]  Take 1 g by mouth 4 (four) times daily -  with meals and at bedtime. [provider]  Active   Suzetrigine  (JOURNAVX ) 50 MG TABS [id number]  Take 1 tablet by mouth daily as needed. Debby Fidela CROME, NP  Active             Recommendation:   Continue Current Plan of Care  Follow Up Plan:   Closing From:  Complex Care Management  Rolin Kerns, LCSW [place]  [facility], [facility] Clinical Social Worker Direct Dial: [phone]  Fax: [phone] Website: [email or web address] 3:07 PM

## 2023-11-10 NOTE — Patient Instructions (Signed)
 Visit Information  Thank you for taking time to visit with me today. Please don't hesitate to contact me if I can be of assistance to you before our next scheduled appointment.  Closing From: Complex Care Management.  Please call the care guide team at [phone] if you need to cancel, schedule, or reschedule an appointment.   Please call the Suicide and Crisis Lifeline: 988 call the [place]  National Suicide Prevention Lifeline: 6105200985 or TTY: 775-736-7983 TTY 404-445-1819) to talk to a trained counselor call 911 if you are experiencing a Mental Health or Behavioral Health Crisis or need someone to talk to.  Rolin Kerns, LCSW [place]  [facility], [facility] Clinical Social Worker Direct Dial: [phone]  Fax: [phone] Website: [email or web address] 3:08 PM

## 2023-11-11 DIAGNOSIS — Z0001 Encounter for general adult medical examination with abnormal findings: Secondary | ICD-10-CM | POA: Diagnosis not present

## 2023-11-11 DIAGNOSIS — R12 Heartburn: Secondary | ICD-10-CM

## 2023-11-11 DIAGNOSIS — B1920 Unspecified viral hepatitis C without hepatic coma: Secondary | ICD-10-CM | POA: Diagnosis not present

## 2023-11-11 DIAGNOSIS — Z136 Encounter for screening for cardiovascular disorders: Secondary | ICD-10-CM | POA: Diagnosis not present

## 2023-11-11 DIAGNOSIS — Z125 Encounter for screening for malignant neoplasm of prostate: Secondary | ICD-10-CM | POA: Diagnosis not present

## 2023-11-11 DIAGNOSIS — Z131 Encounter for screening for diabetes mellitus: Secondary | ICD-10-CM | POA: Diagnosis not present

## 2023-11-11 DIAGNOSIS — K219 Gastro-esophageal reflux disease without esophagitis: Secondary | ICD-10-CM | POA: Diagnosis not present

## 2023-11-11 DIAGNOSIS — Z1329 Encounter for screening for other suspected endocrine disorder: Secondary | ICD-10-CM | POA: Diagnosis not present

## 2023-11-11 DIAGNOSIS — K224 Dyskinesia of esophagus: Secondary | ICD-10-CM

## 2023-11-11 DIAGNOSIS — I10 Essential (primary) hypertension: Secondary | ICD-10-CM | POA: Diagnosis not present

## 2023-11-19 ENCOUNTER — Ambulatory Visit
Admission: RE | Admit: 2023-11-19 | Discharge: 2023-11-19 | Disposition: A | Discharge status: Home / Self Care | Source: Ambulatory Visit | Attending: Physical Medicine and Rehabilitation | Admitting: Physical Medicine and Rehabilitation

## 2023-11-19 ENCOUNTER — Telehealth: Payer: Self-pay

## 2023-11-19 DIAGNOSIS — S14109A Unspecified injury at unspecified level of cervical spinal cord, initial encounter: Secondary | ICD-10-CM | POA: Diagnosis not present

## 2023-11-19 DIAGNOSIS — R1310 Dysphagia, unspecified: Secondary | ICD-10-CM

## 2023-11-19 DIAGNOSIS — S14129S Central cord syndrome at unspecified level of cervical spinal cord, sequela: Secondary | ICD-10-CM

## 2023-11-19 NOTE — Telephone Encounter (Signed)
 Kenneth Flores went for his MRI of his neck. Patient thought the MRI would cover also his lower back pain & pain in both hips. Please advise.   Per Dr. Lovorn she was taking care of his most urgent need due to insurance. As stated during the office visit.  (Message relayed to both parties. They understood & remembered the conversation).

## 2023-12-19 ENCOUNTER — Other Ambulatory Visit: Payer: Self-pay | Admitting: Gastroenterology

## 2023-12-28 ENCOUNTER — Ambulatory Visit: Admitting: Gastroenterology

## 2024-01-04 ENCOUNTER — Ambulatory Visit (INDEPENDENT_AMBULATORY_CARE_PROVIDER_SITE_OTHER): Admitting: Gastroenterology

## 2024-01-04 ENCOUNTER — Encounter: Payer: Self-pay | Admitting: Gastroenterology

## 2024-01-04 VITALS — BP 114/70 | HR 79 | Ht 72.0 in | Wt 151.0 lb

## 2024-01-04 DIAGNOSIS — K224 Dyskinesia of esophagus: Secondary | ICD-10-CM | POA: Diagnosis not present

## 2024-01-04 DIAGNOSIS — K219 Gastro-esophageal reflux disease without esophagitis: Secondary | ICD-10-CM | POA: Diagnosis not present

## 2024-01-04 MED ORDER — FAMOTIDINE 20 MG PO TABS
20.0000 mg | ORAL_TABLET | Freq: Every day | ORAL | 1 refills | Status: AC
Start: 2024-01-04 — End: ?

## 2024-01-04 MED ORDER — PANTOPRAZOLE SODIUM 40 MG PO TBEC: 40.0000 mg | DELAYED_RELEASE_TABLET | Freq: Two times a day (BID) | ORAL | 0 refills | Status: AC

## 2024-01-04 NOTE — Patient Instructions (Addendum)
 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  While uncontrolled reflux can have motility abnormalities, these results in conjunction with the abnormal barium esophagram do seem to correlate with hypercontractile esophagus.   Please plan for the following: 1.)  Pantoprazole  40 mg twice daily 2.) Pepcid  to 20 mg  at bedtime 3.) Trial peppermint oil.  I typically start with 2 Altoid mints before each meal and can titrate up or down as needed  Sent both prescriptions to [facility]  _______________________________________________________  If your blood pressure at your visit was 140/90 or greater, please contact your primary care physician to follow up on this.  _______________________________________________________  If you are age 25 or older, your body mass index should be between 23-30. Your Body mass index is 20.48 kg/m. If this is out of the aforementioned range listed, please consider follow up with your Primary Care Provider.  If you are age 68 or younger, your body mass index should be between 19-25. Your Body mass index is 20.48 kg/m. If this is out of the aformentioned range listed, please consider follow up with your Primary Care Provider.   ________________________________________________________  The [facility] providers would like to encourage you to use MYCHART to communicate with providers for non-urgent requests or questions.  Due to long hold times on the telephone, sending your provider a message by [facility] may be a faster and more efficient way to get a response.  Please allow 48 business hours for a response.  Please remember that this is for non-urgent requests.  _______________________________________________________  [facility] is using a team-based approach to care.  Your team is made up of your doctor and two to three APPS. Our APPS (Nurse Practitioners and Physician Assistants) work with your physician to ensure care  continuity for you. They are fully qualified to address your health concerns and develop a treatment plan. They communicate directly with your gastroenterologist to care for you. Seeing the Advanced Practice Practitioners on your physician's team can help you by facilitating care more promptly, often allowing for earlier appointments, access to diagnostic testing, procedures, and other specialty referrals.   Thank you for trusting me with your gastrointestinal care. Deanna May, FNP-C

## 2024-01-04 NOTE — Progress Notes (Signed)
 Chief Complaint: follow-up eso manometry results Primary GI Doctor: Dr. San   YEP:Kenneth Flores is a 63 year old African American male patient with past medical history of Constipation, History of chronic hepatitis C, Neurogenic bladder with urinary retention, Neurogenic bowel, Hypertension, chronic pain syndrome, who was referred to me by Rosalea Rosina SAILOR, PA on 04/12/23 for a complaint of worsening GERD and nausea.  Last seen in office by myself on 05/13/23 for worsening GERD and nausea. Added Pepcid  at bedtime. Ordered esophagram to evaluate esophageal dysphagia. Recommend colon screening colonoscopy, declined  05/20/23 ESOPHOGRAM/BARIUM SWALLOW  IMPRESSION: *Moderate-to-severe nonspecific esophageal dysmotility. *Otherwise unremarkable study. Patient scheduled for esophageal manometry on 10/06/23.  Past medical history is significant for chronic hepatitis C; genotype 1a and is s/p Vosevi  time 12 weeks. Undetectable viral RNA 01/21/2022. Follows with ID.   Interval History    Patient presents for follow-up on GERD and dysphagia. We reviewed his esophageal manometry results and recommendations. There seems to be some confusion on medications and how to take them. Reviewed them today. He reports overall he feels pretty good. He has been eating small meals throughout the day.  He consumes a lot of protein shakes and has been exercising. He has continued to lose weight despite this.   Wt Readings from Last 3 Encounters:  01/04/24 151 lb (68.5 kg)  11/08/23 160 lb (72.6 kg)  09/23/23 170 lb 9.6 oz (77.4 kg)    Past Medical History:  Diagnosis Date   Hepatitis C    Neck injury 08/18/2022    Past Surgical History:  Procedure Laterality Date   ANTERIOR CERVICAL DECOMP/DISCECTOMY FUSION N/A 08/21/2022   Procedure: ANTERIOR CERVICAL DECOMPRESSION/DISCECTOMY FUSION CERVICAL THREE-FOUR, CERVICAL FOUR-FIVE, CERVICAL FIVE-SIX;  Surgeon: Louis Shove, MD;  Location: [facility] OR;  Service: Neurosurgery;   Laterality: N/A;   ESOPHAGEAL MANOMETRY N/A 10/06/2023   Procedure: MANOMETRY, ESOPHAGUS;  Surgeon: San Sandor GAILS, DO;  Location: [facility];  Service: Gastroenterology;  Laterality: N/A;   PELVIC FRACTURE SURGERY Right     Current Outpatient Medications  Medication Sig Dispense Refill   baclofen  (LIORESAL ) 10 MG tablet Take 1 tablet (10 mg total) by mouth 3 (three) times daily. 270 tablet 1   DULoxetine  (CYMBALTA ) 60 MG capsule Take 2 capsules (120 mg total) by mouth at bedtime. Make sure you fill THIS Rx- not old 30 mg Rx- thanks- for nerve pain with gabapentin  60 capsule 5   ELIQUIS  2.5 MG TABS tablet Take by mouth.     famotidine  (PEPCID ) 20 MG tablet Take 1 tablet (20 mg total) by mouth at bedtime. 90 tablet 1   gabapentin  (NEURONTIN ) 600 MG tablet Take 2 tablets (1,200 mg total) by mouth 2 (two) times daily. X  week, then 1200 mg/2 tabs 3x/day- for nerve pain- max dose 540 tablet 1   lidocaine  (LIDODERM ) 5 % Place 1 patch onto the skin daily. Remove & Discard patch within 12 hours or as directed by MD 30 patch 0   methocarbamol  (ROBAXIN ) 500 MG tablet Take 500 mg by mouth 4 (four) times daily.     pantoprazole  (PROTONIX ) 40 MG tablet Take 1 tablet (40 mg total) by mouth 2 (two) times daily before a meal for 14 days. 28 tablet 0   sildenafil  (VIAGRA ) 100 MG tablet Take 1 tablet (100 mg total) by mouth daily as needed for erectile dysfunction. 30 minutes prior to intimacy 30 tablet 5   sucralfate (CARAFATE) 1 g tablet Take 1 g by mouth 4 (four) times daily -  with meals and at bedtime.     Suzetrigine  (JOURNAVX ) 50 MG TABS Take 1 tablet by mouth daily as needed. 30 tablet 1   No current facility-administered medications for this visit.    Allergies as of 01/04/2024   (Not on File)    Family History  Problem Relation Age of Onset   Hepatitis C Brother    Colon cancer Neg Hx    Esophageal cancer Neg Hx    Liver disease Neg Hx     Review of Systems:    Constitutional: No  weight loss, fever, chills, weakness or fatigue HEENT: Eyes: No change in vision               Ears, Nose, Throat:  No change in hearing or congestion Skin: No rash or itching Cardiovascular: No chest pain, chest pressure or palpitations   Respiratory: No SOB or cough Gastrointestinal: See HPI and otherwise negative Genitourinary: No dysuria or change in urinary frequency Neurological: No headache, dizziness or syncope Musculoskeletal: No new muscle or joint pain Hematologic: No bleeding or bruising Psychiatric: No history of depression or anxiety    Physical Exam:  Vital signs: BP 114/70   Pulse 79   Ht 6' (1.829 m)   Wt 151 lb (68.5 kg)   BMI 20.48 kg/m   Constitutional:   Pleasant  A.A. male appears to be in NAD, Well developed, Well nourished, alert and cooperative Throat: Oral cavity and pharynx without inflammation, swelling or lesion.  Respiratory: Respirations even and unlabored. Lungs clear to auscultation bilaterally.   No wheezes, crackles, or rhonchi.  Cardiovascular: Normal S1, S2. Regular rate and rhythm. No peripheral edema, cyanosis or pallor.  Gastrointestinal:  Soft, nondistended, nontender. No rebound or guarding. Normal bowel sounds. No appreciable masses or hepatomegaly. Rectal:  Not performed.  Msk:  Symmetrical without gross deformities. Without edema, no deformity or joint abnormality.  Neurologic:  Alert and  oriented x4;  grossly normal neurologically.  Skin:   Dry and intact without significant lesions or rashes. Psychiatric: Oriented to person, place and time. Demonstrates good judgement and reason without abnormal affect or behaviors.  RELEVANT LABS AND IMAGING: CBC    Latest Ref Rng & Units 09/03/2023    7:27 PM 09/03/2023    7:14 PM 09/14/2022    6:06 AM  CBC  WBC 4.0 - 10.5 K/uL  11.4  6.0   Hemoglobin 13.0 - 17.0 g/dL 83.6  83.3  86.0   Hematocrit 39.0 - 52.0 % 48.0  47.3  38.3   Platelets 150 - 400 K/uL  114  169      CMP     Latest Ref  Rng & Units 09/03/2023    7:27 PM 09/03/2023    7:14 PM 09/14/2022    6:06 AM  CMP  Glucose 70 - 99 mg/dL 94  95  96   BUN 6 - 20 mg/dL 7  7  15    Creatinine 0.61 - 1.24 mg/dL 9.09  9.08  9.14   Sodium 135 - 145 mmol/L 141  140  132   Potassium 3.5 - 5.1 mmol/L 3.7  3.9  4.1   Chloride 98 - 111 mmol/L 106  106  98   CO2 22 - 32 mmol/L  21  26   Calcium 8.9 - 10.3 mg/dL  9.1  9.0   Total Protein 6.5 - 8.1 g/dL  7.2    Total Bilirubin 0.0 - 1.2 mg/dL  0.7    Alkaline Phos 38 -  126 U/L  57    AST 15 - 41 U/L  20    ALT 0 - 44 U/L  9      09/03/23 CT chest abdomen pelvis IMPRESSION: 1. Nondisplaced left anterior seventh rib fracture. 2. No traumatic injury in the abdomen and pelvis.  05/20/23 ESOPHOGRAM/BARIUM SWALLOW  IMPRESSION: *Moderate-to-severe nonspecific esophageal dysmotility. *Otherwise unremarkable study.  08/19/2022 CT chest abdomen pelvis with contrast IMPRESSION: 1. No acute intracranial CT findings or depressed skull fractures. 2. Cervical degenerative changes without evidence of fractures or listhesis. 3. Moderate to severe spinal canal stenosis with cord compression at C3-4, C4-5, and C5-6 due to short pedicles and dorsal disc osteophyte complexes. 4. No acute trauma related findings in the chest, abdomen or pelvis. 5. 1.2 x 0.9 cm rounded pleural-based nodule in the posterolateral base of the right lower lobe with pleurodiaphragmatic stranding. This could be a nodular scar, but is nonspecific. Consider one of the following in 3 months for both low-risk and high-risk individuals: (a) repeat chest CT, (b) follow-up PET-CT, or (c) tissue sampling. This recommendation follows the consensus statement: Guidelines for Management of Incidental Pulmonary Nodules Detected on CT Images: From the Fleischner Society 2017; Radiology 2017; 284:228-243. 6. 3 mm right upper lobe suprahilar nodule, also new. 7. Aortic and coronary artery atherosclerosis. 8. Diverticulosis  without evidence of diverticulitis. 9. 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 Acuity indeterminate. 10. Emphysema. 09/10/2021 US  abdominal RUQ with elastography ULTRASOUND RUQ:  Coarsened hepatic echotexture with increased hepatic echogenicity suggestive of chronic hepatocellular disease. No suspicious hepatic lesion. ULTRASOUND HEPATIC ELASTOGRAPHY: Median kPa: 25.2 Diagnostic category: > or =17 kPa: highly suggestive of cACLD with an increased probability of clinically significant portal hypertension.  Eso manometry  Results from the Esophageal Manometry study reviewed and notable for the following: - Low resting EG junction pressure with complete relaxation after deglutition.  IRP normal at 5.7 - Esophageal contractions were 80% (8/10 swallows) peristaltic with normal distal latency.  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  While uncontrolled reflux can have motility abnormalities, these results in conjunction with the abnormal barium esophagram do seem to correlate with hypercontractile esophagus.  Assessment: Encounter Diagnoses  Name Primary?   Gastroesophageal reflux disease, unspecified whether esophagitis present Yes   Esophageal dysmotility     63 year old A.A. male patient with uncontrolled GERD and esophageal dysmotility found on esophagram. Followed up with eso manometry and overall per Dr. 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  While uncontrolled reflux can have motility abnormalities, these results in conjunction with the abnormal barium esophagram do seem to correlate with hypercontractile esophagus.  The plan is to maximize his reflux management.  Patient  had some confusions with how to take the medications therefore we went over that again today and I sent new prescriptions and wrote everything down.  Will reevaluate in a couple of months to see how he was doing.  Plan:  -- Start pantoprazole  40 mg twice daily -  Start Pepcid  to 20 mg  at bedtime scheduled  - If having continued reflux breakthrough symptoms on current therapy, will potentially change pantoprazole  to either Aciphex, Dexilant, or Voquenza - Trial peppermint oil.  Start with 2 Altoid mints before each meal and  can titrate up or down as needed - Depending on response, could consider antispasmodic agent (i.e. hyoscyamine, dicyclomine) or possibly short acting nitrate or CCB.  Additionally, there are some data to support TCA or SNRI use - Schedule follow-up in the GI clinic in 3 mths with APP or Dr. San  -recommend discussing with PCP possible appetite stimulant.   Thank you for the courtesy of this consult. Please call me with any questions or concerns.   Katrell Milhorn, FNP-C [facility] 01/04/2024, 4:44 PM  Cc: Rosalea Rosina SAILOR, PA

## 2024-01-26 NOTE — Progress Notes (Signed)
 Agree with the assessment and plan as outlined by [facility], FNP-C.  Carlitos Bottino, DO, [facility]

## 2024-01-31 ENCOUNTER — Encounter: Attending: Registered Nurse | Admitting: Physical Medicine and Rehabilitation

## 2024-01-31 ENCOUNTER — Encounter: Payer: Self-pay | Admitting: Physical Medicine and Rehabilitation

## 2024-01-31 VITALS — BP 120/78 | HR 65 | Ht 72.0 in | Wt 155.0 lb

## 2024-01-31 DIAGNOSIS — G4701 Insomnia due to medical condition: Secondary | ICD-10-CM | POA: Diagnosis not present

## 2024-01-31 DIAGNOSIS — R269 Unspecified abnormalities of gait and mobility: Secondary | ICD-10-CM | POA: Insufficient documentation

## 2024-01-31 DIAGNOSIS — S14155D Other incomplete lesion at C5 level of cervical spinal cord, subsequent encounter: Secondary | ICD-10-CM | POA: Diagnosis not present

## 2024-01-31 DIAGNOSIS — R252 Cramp and spasm: Secondary | ICD-10-CM | POA: Insufficient documentation

## 2024-01-31 DIAGNOSIS — G8921 Chronic pain due to trauma: Secondary | ICD-10-CM | POA: Insufficient documentation

## 2024-01-31 DIAGNOSIS — G8254 Quadriplegia, C5-C7 incomplete: Secondary | ICD-10-CM | POA: Diagnosis not present

## 2024-01-31 DIAGNOSIS — M21372 Foot drop, left foot: Secondary | ICD-10-CM | POA: Diagnosis not present

## 2024-01-31 DIAGNOSIS — M6281 Muscle weakness (generalized): Secondary | ICD-10-CM | POA: Insufficient documentation
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
## 2024-02-09 DIAGNOSIS — S14109S Unspecified injury at unspecified level of cervical spinal cord, sequela: Secondary | ICD-10-CM | POA: Diagnosis not present

## 2024-02-09 DIAGNOSIS — K219 Gastro-esophageal reflux disease without esophagitis: Secondary | ICD-10-CM | POA: Diagnosis not present

## 2024-02-09 DIAGNOSIS — M5020 Other cervical disc displacement, unspecified cervical region: Secondary | ICD-10-CM | POA: Diagnosis not present

## 2024-02-09 DIAGNOSIS — D751 Secondary polycythemia: Secondary | ICD-10-CM | POA: Diagnosis not present

## 2024-02-09 DIAGNOSIS — M4722 Other spondylosis with radiculopathy, cervical region: Secondary | ICD-10-CM | POA: Diagnosis not present

## 2024-02-09 DIAGNOSIS — G9589 Other specified diseases of spinal cord: Secondary | ICD-10-CM | POA: Diagnosis not present

## 2024-02-09 DIAGNOSIS — Z23 Encounter for immunization: Secondary | ICD-10-CM | POA: Diagnosis not present

## 2024-02-09 DIAGNOSIS — D696 Thrombocytopenia, unspecified: Secondary | ICD-10-CM | POA: Diagnosis not present

## 2024-02-09 DIAGNOSIS — I10 Essential (primary) hypertension: Secondary | ICD-10-CM | POA: Diagnosis not present

## 2024-02-09 DIAGNOSIS — E876 Hypokalemia: Secondary | ICD-10-CM | POA: Diagnosis not present

## 2024-02-28 ENCOUNTER — Ambulatory Visit: Admitting: Physical Medicine and Rehabilitation

## 2024-03-01 DIAGNOSIS — M4722 Other spondylosis with radiculopathy, cervical region: Secondary | ICD-10-CM | POA: Diagnosis not present

## 2024-03-01 DIAGNOSIS — I10 Essential (primary) hypertension: Secondary | ICD-10-CM | POA: Diagnosis not present

## 2024-03-01 DIAGNOSIS — D696 Thrombocytopenia, unspecified: Secondary | ICD-10-CM | POA: Diagnosis not present

## 2024-03-01 DIAGNOSIS — G9589 Other specified diseases of spinal cord: Secondary | ICD-10-CM | POA: Diagnosis not present

## 2024-03-01 DIAGNOSIS — S14109S Unspecified injury at unspecified level of cervical spinal cord, sequela: Secondary | ICD-10-CM | POA: Diagnosis not present

## 2024-03-01 DIAGNOSIS — K219 Gastro-esophageal reflux disease without esophagitis: Secondary | ICD-10-CM | POA: Diagnosis not present

## 2024-05-05 ENCOUNTER — Encounter: Payer: Self-pay | Attending: Registered Nurse | Admitting: Physical Medicine and Rehabilitation
